# Patient Record
Sex: Male | Born: 1945 | Race: White | Hispanic: No | Marital: Married | Smoking: Never smoker
Health system: Southern US, Community
[De-identification: ages and names within clinical notes are randomized; demographics above are authoritative.]

## PROBLEM LIST (undated history)

## (undated) DIAGNOSIS — K219 Gastro-esophageal reflux disease without esophagitis: Secondary | ICD-10-CM

## (undated) DIAGNOSIS — M1990 Unspecified osteoarthritis, unspecified site: Secondary | ICD-10-CM

## (undated) DIAGNOSIS — Z974 Presence of external hearing-aid: Secondary | ICD-10-CM

## (undated) DIAGNOSIS — R7303 Prediabetes: Secondary | ICD-10-CM

## (undated) DIAGNOSIS — C801 Malignant (primary) neoplasm, unspecified: Secondary | ICD-10-CM

## (undated) DIAGNOSIS — D126 Benign neoplasm of colon, unspecified: Secondary | ICD-10-CM

## (undated) DIAGNOSIS — E785 Hyperlipidemia, unspecified: Secondary | ICD-10-CM

## (undated) DIAGNOSIS — I6529 Occlusion and stenosis of unspecified carotid artery: Secondary | ICD-10-CM

## (undated) DIAGNOSIS — T7840XA Allergy, unspecified, initial encounter: Secondary | ICD-10-CM

## (undated) DIAGNOSIS — I10 Essential (primary) hypertension: Secondary | ICD-10-CM

## (undated) DIAGNOSIS — I2510 Atherosclerotic heart disease of native coronary artery without angina pectoris: Secondary | ICD-10-CM

## (undated) DIAGNOSIS — I639 Cerebral infarction, unspecified: Secondary | ICD-10-CM

## (undated) HISTORY — PX: SHOULDER SURGERY: SHX246

## (undated) HISTORY — DX: Malignant (primary) neoplasm, unspecified: C80.1

## (undated) HISTORY — DX: Gastro-esophageal reflux disease without esophagitis: K21.9

## (undated) HISTORY — DX: Allergy, unspecified, initial encounter: T78.40XA

## (undated) HISTORY — PX: OTHER SURGICAL HISTORY: SHX169

## (undated) NOTE — *Deleted (*Deleted)
TREATMENT   Ther-ex Octane L3 during history taking for warm-up x 5 minutes ( unbilled) Leg press 70# x 20, 85# x 20;  Standing hip strengthening with 4# ankle weights: Hip flexion marches x 20 BLE; HS curls x 20 BLE; Hip abduction x 20 BLE; Hip extension x 20 BLE;  Heel raisesBUE support 2s holdx 20;  Seated LAQ with 4# ankle weights x 20 BLE;  Sit to stand from regular height chair with right lower extremity on 5 inch step2 x 10;   Neuromuscular Re-education All balance exercises performed without UE support: 1/2 foam roll balance with flat side upx 30s; 1/2 foam rollheel/toe rockingh flat side upx 10 each; 1/2 foam roll tandem balance alternating forward LE 30s x 2 eachLE forward Airex alternating 6" step taps alternating LE x 10 each; Airex NBOS eyes open/closed x 30s each; Airex NBOS eyes open horizontal and vertical head turns x 30s each; Airex staggerred stance with front foot on 5" step x 60s each LE;   Pt educated throughout session about proper posture and technique with exercises. Improved exercise technique, movement at target joints, use of target muscles after min to mod verbal, visual, tactile cues.   Patient demonstrates excellent motivation throughout physical therapy session.Continued with balance and strengthening exercises during session today.  Patient reports he is not feeling great today so decreased resistance on the leg press. He requires more seated rest breaks today during session.  Continued with balance exercises on unstable surfaces such as half foam roller and Airex pad. Patient encouraged to continue HEP and follow-up as scheduled.Pt will benefit from PT services to address deficits in strength, balance, and mobility in order to return to full function at home.

---

## 2004-06-25 ENCOUNTER — Ambulatory Visit: Payer: Self-pay | Admitting: Unknown Physician Specialty

## 2008-04-18 ENCOUNTER — Emergency Department: Payer: Self-pay | Admitting: Emergency Medicine

## 2011-01-27 ENCOUNTER — Ambulatory Visit: Payer: Self-pay

## 2011-11-28 DIAGNOSIS — K2970 Gastritis, unspecified, without bleeding: Secondary | ICD-10-CM | POA: Insufficient documentation

## 2011-11-28 DIAGNOSIS — Z0189 Encounter for other specified special examinations: Secondary | ICD-10-CM | POA: Insufficient documentation

## 2011-11-28 DIAGNOSIS — E7800 Pure hypercholesterolemia, unspecified: Secondary | ICD-10-CM | POA: Insufficient documentation

## 2013-09-23 DIAGNOSIS — J301 Allergic rhinitis due to pollen: Secondary | ICD-10-CM | POA: Insufficient documentation

## 2013-09-23 DIAGNOSIS — E559 Vitamin D deficiency, unspecified: Secondary | ICD-10-CM | POA: Insufficient documentation

## 2013-09-23 DIAGNOSIS — C4491 Basal cell carcinoma of skin, unspecified: Secondary | ICD-10-CM | POA: Insufficient documentation

## 2014-01-09 ENCOUNTER — Ambulatory Visit: Payer: Self-pay | Admitting: Gastroenterology

## 2014-01-31 DIAGNOSIS — D126 Benign neoplasm of colon, unspecified: Secondary | ICD-10-CM | POA: Insufficient documentation

## 2014-03-29 ENCOUNTER — Other Ambulatory Visit: Payer: Self-pay | Admitting: *Deleted

## 2014-03-29 ENCOUNTER — Ambulatory Visit (INDEPENDENT_AMBULATORY_CARE_PROVIDER_SITE_OTHER): Payer: Medicare Other | Admitting: Podiatry

## 2014-03-29 ENCOUNTER — Encounter: Payer: Self-pay | Admitting: Podiatry

## 2014-03-29 ENCOUNTER — Ambulatory Visit (INDEPENDENT_AMBULATORY_CARE_PROVIDER_SITE_OTHER): Payer: Medicare Other

## 2014-03-29 ENCOUNTER — Encounter: Payer: Self-pay | Admitting: *Deleted

## 2014-03-29 VITALS — BP 128/77 | HR 77 | Resp 16 | Ht 67.0 in | Wt 165.0 lb

## 2014-03-29 DIAGNOSIS — M2012 Hallux valgus (acquired), left foot: Secondary | ICD-10-CM

## 2014-03-29 DIAGNOSIS — Q828 Other specified congenital malformations of skin: Secondary | ICD-10-CM | POA: Diagnosis not present

## 2014-03-29 DIAGNOSIS — M21612 Bunion of left foot: Secondary | ICD-10-CM

## 2014-03-29 NOTE — Progress Notes (Signed)
   Subjective:    [name], male    DOB: May 24, 1945, 69 y.o.   MRN: [id number]  HPI Comments: Callused lesion on the left foot, left great toe , and 5th plantar met . 2nd met . Real sore when on it.   Foot Pain      Review of Systems  All other systems reviewed and are negative.      Objective:   Physical Exam: I have reviewed his past mental history medications allergies surgery social history and review of systems. Pulses are palpable bilateral. Neurologic sensorium is intact percent was C monofilament. Deep tendon reflexes are intact bilateral muscle strength is 5 over 5 dorsiflexion plantar flexors and inverters everters on physical musculatures intact. Orthopedic evaluation of his rates all joints distal to the ankle for range of motion without crepitation. Cutaneous evaluationof well-hydrated cutis porokeratotic lesion sub-IP joint of the hallux left subsecond metatarsophalangeal joint left and sub-fifth metatarsophalangeal joint left. Minimal calluses to the plantar aspect of the right foot.        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  Plan: Debridement of porokeratotic lesions follow up with him as needed.

## 2014-05-29 LAB — SURGICAL PATHOLOGY

## 2015-12-05 DIAGNOSIS — R42 Dizziness and giddiness: Secondary | ICD-10-CM | POA: Insufficient documentation

## 2016-08-19 DIAGNOSIS — I10 Essential (primary) hypertension: Secondary | ICD-10-CM

## 2017-06-18 DIAGNOSIS — K219 Gastro-esophageal reflux disease without esophagitis: Secondary | ICD-10-CM | POA: Insufficient documentation

## 2019-04-01 ENCOUNTER — Ambulatory Visit: Payer: Medicare Other | Attending: Internal Medicine

## 2019-04-01 DIAGNOSIS — Z23 Encounter for immunization: Secondary | ICD-10-CM

## 2019-04-01 NOTE — Progress Notes (Signed)
   Covid-19 Vaccination Clinic  Name:  Russell Herman    MRN: [id number] DOB: 1945-10-09  04/01/2019  [name] was observed post Covid-19 immunization for 15 minutes without incidence. He was provided with Vaccine Information Sheet and instruction to access the V-Safe system.   [name] was instructed to call 911 with any severe reactions post vaccine: Marland Kitchen Difficulty breathing  . Swelling of your face and throat  . A fast heartbeat  . A bad rash all over your body  . Dizziness and weakness    Immunizations Administered    Name Date Dose VIS Date Route   Pfizer COVID-19 Vaccine 04/01/2019 10:44 AM 0.3 mL 01/14/2019 Intramuscular   Manufacturer: [place]   Lot: [id number]   [place]: [id number]

## 2019-04-27 ENCOUNTER — Encounter: Payer: Self-pay | Admitting: Otolaryngology

## 2019-04-27 ENCOUNTER — Ambulatory Visit: Payer: Medicare Other | Attending: Internal Medicine

## 2019-04-27 ENCOUNTER — Other Ambulatory Visit: Payer: Self-pay

## 2019-04-27 DIAGNOSIS — Z23 Encounter for immunization: Secondary | ICD-10-CM

## 2019-04-27 NOTE — Progress Notes (Signed)
   Covid-19 Vaccination Clinic  Name:  HIXON SANDO    MRN: [id number] DOB: 1945-04-18  04/27/2019  Mr. Lorenzano was observed post Covid-19 immunization for 15 minutes without incident. He was provided with Vaccine Information Sheet and instruction to access the V-Safe system.   Mr. Mandl was instructed to call 911 with any severe reactions post vaccine: Marland Kitchen Difficulty breathing  . Swelling of face and throat  . A fast heartbeat  . A bad rash all over body  . Dizziness and weakness   Immunizations Administered    Name Date Dose VIS Date Route   Pfizer COVID-19 Vaccine 04/27/2019  1:40 PM 0.3 mL 01/14/2019 Intramuscular   Manufacturer: [place]   Lot: [id number]   [place]: [id number]

## 2019-04-28 NOTE — Discharge Instructions (Signed)
General Anesthesia, Adult, Care After This sheet gives you information about how to care for yourself after your procedure. Your health care provider may also give you more specific instructions. If you have problems or questions, contact your health care provider. What can I expect after the procedure? After the procedure, the following side effects are common:  Pain or discomfort at the IV site.  Nausea.  Vomiting.  Sore throat.  Trouble concentrating.  Feeling cold or chills.  Weak or tired.  Sleepiness and fatigue.  Soreness and body aches. These side effects can affect parts of the body that were not involved in surgery. Follow these instructions at home:  For at least 24 hours after the procedure:  Have a responsible adult stay with you. It is important to have someone help care for you until you are awake and alert.  Rest as needed.  Do not: ? Participate in activities in which you could fall or become injured. ? Drive. ? Use heavy machinery. ? Drink alcohol. ? Take sleeping pills or medicines that cause drowsiness. ? Make important decisions or sign legal documents. ? Take care of children on your own. Eating and drinking  Follow any instructions from your health care provider about eating or drinking restrictions.  When you feel hungry, start by eating small amounts of foods that are soft and easy to digest (bland), such as toast. Gradually return to your regular diet.  Drink enough fluid to keep your urine pale yellow.  If you vomit, rehydrate by drinking water, juice, or clear broth. General instructions  If you have sleep apnea, surgery and certain medicines can increase your risk for breathing problems. Follow instructions from your health care provider about wearing your sleep device: ? Anytime you are sleeping, including during daytime naps. ? While taking prescription pain medicines, sleeping medicines, or medicines that make you drowsy.  Return to  your normal activities as told by your health care provider. Ask your health care provider what activities are safe for you.  Take over-the-counter and prescription medicines only as told by your health care provider.  If you smoke, do not smoke without supervision.  Keep all follow-up visits as told by your health care provider. This is important. Contact a health care provider if:  You have nausea or vomiting that does not get better with medicine.  You cannot eat or drink without vomiting.  You have pain that does not get better with medicine.  You are unable to pass urine.  You develop a skin rash.  You have a fever.  You have redness around your IV site that gets worse. Get help right away if:  You have difficulty breathing.  You have chest pain.  You have blood in your urine or stool, or you vomit blood. Summary  After the procedure, it is common to have a sore throat or nausea. It is also common to feel tired.  Have a responsible adult stay with you for the first 24 hours after general anesthesia. It is important to have someone help care for you until you are awake and alert.  When you feel hungry, start by eating small amounts of foods that are soft and easy to digest (bland), such as toast. Gradually return to your regular diet.  Drink enough fluid to keep your urine pale yellow.  Return to your normal activities as told by your health care provider. Ask your health care provider what activities are safe for you. This information is not   intended to replace advice given to you by your health care provider. Make sure you discuss any questions you have with your health care provider. Document Revised: 01/23/2017 Document Reviewed: 09/05/2016 Elsevier Patient Education  2020 Elsevier Inc.  

## 2019-05-03 ENCOUNTER — Other Ambulatory Visit
Admission: RE | Admit: 2019-05-03 | Discharge: 2019-05-03 | Disposition: A | Discharge status: Home / Self Care | Payer: Medicare Other | Source: Ambulatory Visit | Attending: Otolaryngology | Admitting: Otolaryngology

## 2019-05-03 ENCOUNTER — Other Ambulatory Visit: Payer: Self-pay

## 2019-05-03 DIAGNOSIS — Z01812 Encounter for preprocedural laboratory examination: Secondary | ICD-10-CM | POA: Diagnosis present

## 2019-05-03 DIAGNOSIS — Z20822 Contact with and (suspected) exposure to covid-19: Secondary | ICD-10-CM | POA: Diagnosis not present

## 2019-05-03 LAB — SARS CORONAVIRUS 2 (TAT 6-24 HRS): SARS Coronavirus 2: NEGATIVE

## 2019-05-05 ENCOUNTER — Ambulatory Visit
Admission: RE | Admit: 2019-05-05 | Discharge: 2019-05-05 | Disposition: A | Discharge status: Home / Self Care | Payer: Medicare Other | Source: Ambulatory Visit | Attending: Otolaryngology | Admitting: Otolaryngology

## 2019-05-05 ENCOUNTER — Other Ambulatory Visit: Payer: Self-pay

## 2019-05-05 ENCOUNTER — Ambulatory Visit: Payer: Medicare Other | Admitting: Anesthesiology

## 2019-05-05 ENCOUNTER — Encounter
Admission: RE | Disposition: A | Discharge status: Home / Self Care | Payer: Self-pay | Source: Ambulatory Visit | Attending: Otolaryngology

## 2019-05-05 ENCOUNTER — Encounter: Payer: Self-pay | Admitting: Otolaryngology

## 2019-05-05 DIAGNOSIS — K219 Gastro-esophageal reflux disease without esophagitis: Secondary | ICD-10-CM | POA: Insufficient documentation

## 2019-05-05 DIAGNOSIS — Z85828 Personal history of other malignant neoplasm of skin: Secondary | ICD-10-CM | POA: Insufficient documentation

## 2019-05-05 DIAGNOSIS — Z8711 Personal history of peptic ulcer disease: Secondary | ICD-10-CM | POA: Insufficient documentation

## 2019-05-05 DIAGNOSIS — Z882 Allergy status to sulfonamides status: Secondary | ICD-10-CM | POA: Insufficient documentation

## 2019-05-05 DIAGNOSIS — L980 Pyogenic granuloma: Secondary | ICD-10-CM | POA: Diagnosis present

## 2019-05-05 DIAGNOSIS — Z79899 Other long term (current) drug therapy: Secondary | ICD-10-CM | POA: Insufficient documentation

## 2019-05-05 DIAGNOSIS — Z881 Allergy status to other antibiotic agents status: Secondary | ICD-10-CM | POA: Insufficient documentation

## 2019-05-05 HISTORY — DX: Presence of external hearing-aid: Z97.4

## 2019-05-05 HISTORY — DX: Unspecified osteoarthritis, unspecified site: M19.90

## 2019-05-05 HISTORY — DX: Gastro-esophageal reflux disease without esophagitis: K21.9

## 2019-05-05 HISTORY — DX: Essential (primary) hypertension: I10

## 2019-05-05 HISTORY — PX: EXCISION OF TONGUE LESION: SHX6434

## 2019-05-05 SURGERY — EXCISION, LESION, TONGUE
Anesthesia: General | Site: Mouth | Laterality: Bilateral

## 2019-05-05 MED ORDER — LACTATED RINGERS IV SOLN
100.0000 mL/h | INTRAVENOUS | Status: DC
  Administered 2019-05-05: 100 mL/h via INTRAVENOUS

## 2019-05-05 MED ORDER — HYDROCODONE-ACETAMINOPHEN 5-325 MG PO TABS: 1.0000 | ORAL_TABLET | Freq: Four times a day (QID) | ORAL | 0 refills | Status: AC | PRN

## 2019-05-05 MED ORDER — OXYCODONE HCL 5 MG PO TABS
5.0000 mg | ORAL_TABLET | Freq: Once | ORAL | Status: AC | PRN
  Administered 2019-05-05: 5 mg via ORAL

## 2019-05-05 MED ORDER — DEXAMETHASONE SODIUM PHOSPHATE 4 MG/ML IJ SOLN
INTRAMUSCULAR | Status: DC | PRN
  Administered 2019-05-05: 10 mg via INTRAVENOUS

## 2019-05-05 MED ORDER — FENTANYL CITRATE (PF) 100 MCG/2ML IJ SOLN: 25.0000 ug | INTRAMUSCULAR | Status: DC | PRN

## 2019-05-05 MED ORDER — OXYCODONE HCL 5 MG/5ML PO SOLN: 5.0000 mg | Freq: Once | ORAL | Status: AC | PRN

## 2019-05-05 MED ORDER — SUCCINYLCHOLINE CHLORIDE 20 MG/ML IJ SOLN
INTRAMUSCULAR | Status: DC | PRN
  Administered 2019-05-05: 180 mg via INTRAVENOUS

## 2019-05-05 MED ORDER — FENTANYL CITRATE (PF) 100 MCG/2ML IJ SOLN
INTRAMUSCULAR | Status: DC | PRN
  Administered 2019-05-05: 50 ug via INTRAVENOUS

## 2019-05-05 MED ORDER — PROPOFOL 10 MG/ML IV BOLUS
INTRAVENOUS | Status: DC | PRN
  Administered 2019-05-05: 110 mg via INTRAVENOUS

## 2019-05-05 MED ORDER — ROCURONIUM BROMIDE 100 MG/10ML IV SOLN
INTRAVENOUS | Status: DC | PRN
  Administered 2019-05-05: 10 mg via INTRAVENOUS

## 2019-05-05 MED ORDER — LIDOCAINE HCL (CARDIAC) PF 100 MG/5ML IV SOSY
PREFILLED_SYRINGE | INTRAVENOUS | Status: DC | PRN
  Administered 2019-05-05: 60 mg via INTRAVENOUS

## 2019-05-05 MED ORDER — ONDANSETRON HCL 4 MG/2ML IJ SOLN
INTRAMUSCULAR | Status: DC | PRN
  Administered 2019-05-05: 4 mg via INTRAVENOUS

## 2019-05-05 MED ORDER — MIDAZOLAM HCL 5 MG/5ML IJ SOLN
INTRAMUSCULAR | Status: DC | PRN
  Administered 2019-05-05: 2 mg via INTRAVENOUS

## 2019-05-05 SURGICAL SUPPLY — 14 items
ELECT REM PT RETURN 9FT ADLT (ELECTROSURGICAL) ×3
ELECTRODE REM PT RTRN 9FT ADLT (ELECTROSURGICAL) ×1 IMPLANT
GLOVE PI ULTRA LF STRL 7.5 (GLOVE) ×1 IMPLANT
GLOVE PI ULTRA NON LATEX 7.5 (GLOVE) ×4
KIT TURNOVER KIT A (KITS) ×3 IMPLANT
NDL HYPO 27GX1-1/4 (NEEDLE) IMPLANT
NEEDLE HYPO 27GX1-1/4 (NEEDLE) ×3 IMPLANT
NS IRRIG 500ML POUR BTL (IV SOLUTION) ×3 IMPLANT
PENCIL SMOKE EVACUATOR (MISCELLANEOUS) ×3 IMPLANT
SPONGE XRAY 4X4 16PLY STRL (MISCELLANEOUS) ×3 IMPLANT
STRAP BODY AND KNEE 60X3 (MISCELLANEOUS) ×3 IMPLANT
SUT CHROMIC 5 0 P 3 (SUTURE) ×2 IMPLANT
SYR 3ML LL SCALE MARK (SYRINGE) ×2 IMPLANT
TOWEL OR 17X26 4PK STRL BLUE (TOWEL DISPOSABLE) ×3 IMPLANT

## 2019-05-05 NOTE — Op Note (Signed)
05/05/2019  10:38 AM    Darlyn Read  [place]:[id number]   Pre-Op Dx: Right dorsal tongue lesion  Post-op Dx: Right dorsal tongue lesion  Proc: Excision right dorsal tongue lesion  Surg:  Elon Alas Cloyce Blankenhorn  Anes:  GOT  EBL: Minimal  Comp: None  Findings: Exophytic growth from the right dorsal tongue that appeared to have a small stalk attached to the mucosal surface of the tongue.  Procedure: Patient was brought to the operating room and placed in supine position.  He was given general anesthesia by oral endotracheal intubation.  Once the patient was asleep a mouthgag was placed to hold his teeth open.  A towel clip was used to grasp the anterior midline tongue and pull the tongue forward.  The lesion was a centimeter and a half and attached to the right dorsal anterior tongue.  It was attached to the mucosa and was rounded and sticking out from the mucosa.  Using electrocautery the base of the lesion was excised at the mucosal layer in an ellipse of mucosa was removed.  This was attached to the bottom of the lesion.  The entire lesion and mucosa was sent for permanent section.  The defect was about a centimeter long and half centimeter wide with muscle evident beneath it.  There was no significant bleeding at all.  The wound was closed with 3 interrupted sutures of 5-0 chromic.  These were placed where the knots were buried underneath the surface.  This brought the edges together to close the defect.  The patient tolerated the procedure well the oral gag was removed to allow the mouth to close.  He was awakened and taken to the recovery room in satisfactory condition.  Dispo:   To PACU to be discharged home.  Plan: To follow-up in the office in 1 week.  Make sure the wound is healing well and we will go over the pathology report.  I have written for some Tylenol with hydrocodone for pain if needed over the next couple days but then he can use Tylenol or ibuprofen as necessary.  We will  start with a liquid diet and slowly increase it as tolerated.  Elon Alas Hanna Ra  05/05/2019 10:38 AM

## 2019-05-05 NOTE — Transfer of Care (Signed)
Immediate Anesthesia Transfer of Care Note  Patient: Russell Herman  Procedure(s) Performed: EXCISION OF DORSAL TONGUE LESION (Bilateral Mouth)  Patient Location: PACU  Anesthesia Type: General  Level of Consciousness: awake, alert  and patient cooperative  Airway and Oxygen Therapy: Patient Spontanous Breathing and Patient connected to supplemental oxygen  Post-op Assessment: Post-op Vital signs reviewed, Patient's Cardiovascular Status Stable, Respiratory Function Stable, Patent Airway and No signs of Nausea or vomiting  Post-op Vital Signs: Reviewed and stable  Complications: No apparent anesthesia complications

## 2019-05-05 NOTE — Anesthesia Postprocedure Evaluation (Signed)
Anesthesia Post Note  Patient: Russell Herman  Procedure(s) Performed: EXCISION OF DORSAL TONGUE LESION (Bilateral Mouth)     Patient location during evaluation: PACU Anesthesia Type: General Level of consciousness: awake and alert Pain management: pain level controlled Vital Signs Assessment: post-procedure vital signs reviewed and stable Respiratory status: spontaneous breathing, nonlabored ventilation, respiratory function stable and patient connected to nasal cannula oxygen Cardiovascular status: blood pressure returned to baseline and stable Postop Assessment: no apparent nausea or vomiting Anesthetic complications: no    Adele Barthel Duvall Comes

## 2019-05-05 NOTE — H&P (Signed)
H&P has been reviewed and patient reevaluated, no changes necessary. To be downloaded later.  

## 2019-05-05 NOTE — Anesthesia Procedure Notes (Signed)
Procedure Name: Intubation Date/Time: 05/05/2019 10:15 AM Performed by: Silvana Newness, CRNA Pre-anesthesia Checklist: Patient identified, Emergency Drugs available, Suction available, Patient being monitored and Timeout performed Patient Re-evaluated:Patient Re-evaluated prior to induction Oxygen Delivery Method: Circle system utilized Preoxygenation: Pre-oxygenation with 100% oxygen Induction Type: IV induction Ventilation: Mask ventilation without difficulty Laryngoscope Size: Mac and 4 Grade View: Grade I Tube type: Oral Tube size: 7.0 mm Number of attempts: 1 Airway Equipment and Method: Stylet Placement Confirmation: ETT inserted through vocal cords under direct vision,  positive ETCO2 and breath sounds checked- equal and bilateral Secured at: 22 cm Tube secured with: Tape Dental Injury: Teeth and Oropharynx as per pre-operative assessment

## 2019-05-05 NOTE — Anesthesia Preprocedure Evaluation (Signed)
Anesthesia Evaluation  Patient identified by MRN, date of birth, ID band Patient awake    History of Anesthesia Complications Negative for: history of anesthetic complications  Airway Mallampati: III  TM Distance: >3 FB Neck ROM: Full   Comment: Dime-sized lesion on lateral side of tongue Dental  (+) Partial Upper   Pulmonary neg pulmonary ROS,    Pulmonary exam normal        Cardiovascular hypertension, Normal cardiovascular exam     Neuro/Psych negative neurological ROS     GI/Hepatic Neg liver ROS, GERD  Medicated and Controlled,  Endo/Other  negative endocrine ROS  Renal/GU negative Renal ROS     Musculoskeletal   Abdominal   Peds  Hematology negative hematology ROS (+)   Anesthesia Other Findings   Reproductive/Obstetrics                             Anesthesia Physical Anesthesia Plan  ASA: II  Anesthesia Plan: General   Post-op Pain Management:    Induction: Intravenous  PONV Risk Score and Plan: 2 and Ondansetron and Treatment may vary due to age or medical condition  Airway Management Planned: Oral ETT  Additional Equipment: None  Intra-op Plan:   Post-operative Plan: Extubation in OR  Informed Consent: I have reviewed the patients History and Physical, chart, labs and discussed the procedure including the risks, benefits and alternatives for the proposed anesthesia with the patient or authorized representative who has indicated his/her understanding and acceptance.       Plan Discussed with: CRNA  Anesthesia Plan Comments:         Anesthesia Quick Evaluation

## 2019-05-06 LAB — SURGICAL PATHOLOGY

## 2019-05-12 ENCOUNTER — Encounter: Payer: Self-pay | Admitting: *Deleted

## 2019-07-05 ENCOUNTER — Inpatient Hospital Stay
Admission: EM | Admit: 2019-07-05 | Discharge: 2019-07-07 | DRG: 066 | Disposition: A | Discharge status: Rehabilitation Facility | Payer: Medicare Other | Attending: Internal Medicine | Admitting: Internal Medicine

## 2019-07-05 ENCOUNTER — Other Ambulatory Visit: Payer: Self-pay

## 2019-07-05 ENCOUNTER — Observation Stay: Payer: Medicare Other

## 2019-07-05 ENCOUNTER — Encounter: Payer: Self-pay | Admitting: Emergency Medicine

## 2019-07-05 ENCOUNTER — Emergency Department: Payer: Medicare Other

## 2019-07-05 DIAGNOSIS — I779 Disorder of arteries and arterioles, unspecified: Secondary | ICD-10-CM

## 2019-07-05 DIAGNOSIS — Z20822 Contact with and (suspected) exposure to covid-19: Secondary | ICD-10-CM | POA: Diagnosis present

## 2019-07-05 DIAGNOSIS — E876 Hypokalemia: Secondary | ICD-10-CM | POA: Diagnosis present

## 2019-07-05 DIAGNOSIS — I639 Cerebral infarction, unspecified: Principal | ICD-10-CM | POA: Diagnosis present

## 2019-07-05 DIAGNOSIS — Z85828 Personal history of other malignant neoplasm of skin: Secondary | ICD-10-CM

## 2019-07-05 DIAGNOSIS — G459 Transient cerebral ischemic attack, unspecified: Secondary | ICD-10-CM | POA: Diagnosis present

## 2019-07-05 DIAGNOSIS — I10 Essential (primary) hypertension: Secondary | ICD-10-CM | POA: Diagnosis present

## 2019-07-05 DIAGNOSIS — E785 Hyperlipidemia, unspecified: Secondary | ICD-10-CM | POA: Diagnosis present

## 2019-07-05 DIAGNOSIS — I6523 Occlusion and stenosis of bilateral carotid arteries: Secondary | ICD-10-CM | POA: Diagnosis present

## 2019-07-05 DIAGNOSIS — Z888 Allergy status to other drugs, medicaments and biological substances status: Secondary | ICD-10-CM

## 2019-07-05 DIAGNOSIS — E7800 Pure hypercholesterolemia, unspecified: Secondary | ICD-10-CM | POA: Diagnosis present

## 2019-07-05 DIAGNOSIS — E782 Mixed hyperlipidemia: Secondary | ICD-10-CM | POA: Diagnosis present

## 2019-07-05 DIAGNOSIS — E559 Vitamin D deficiency, unspecified: Secondary | ICD-10-CM | POA: Diagnosis present

## 2019-07-05 DIAGNOSIS — G8314 Monoplegia of lower limb affecting left nondominant side: Secondary | ICD-10-CM | POA: Diagnosis present

## 2019-07-05 DIAGNOSIS — M47812 Spondylosis without myelopathy or radiculopathy, cervical region: Secondary | ICD-10-CM | POA: Diagnosis present

## 2019-07-05 DIAGNOSIS — Z79899 Other long term (current) drug therapy: Secondary | ICD-10-CM

## 2019-07-05 DIAGNOSIS — K219 Gastro-esophageal reflux disease without esophagitis: Secondary | ICD-10-CM | POA: Diagnosis present

## 2019-07-05 DIAGNOSIS — Z808 Family history of malignant neoplasm of other organs or systems: Secondary | ICD-10-CM

## 2019-07-05 DIAGNOSIS — I739 Peripheral vascular disease, unspecified: Secondary | ICD-10-CM | POA: Diagnosis present

## 2019-07-05 DIAGNOSIS — Z882 Allergy status to sulfonamides status: Secondary | ICD-10-CM

## 2019-07-05 DIAGNOSIS — R29703 NIHSS score 3: Secondary | ICD-10-CM | POA: Diagnosis present

## 2019-07-05 DIAGNOSIS — Z803 Family history of malignant neoplasm of breast: Secondary | ICD-10-CM

## 2019-07-05 HISTORY — DX: Benign neoplasm of colon, unspecified: D12.6

## 2019-07-05 LAB — DIFFERENTIAL
Abs Immature Granulocytes: 0.04 10*3/uL (ref 0.00–0.07)
Basophils Absolute: 0.1 10*3/uL (ref 0.0–0.1)
Basophils Relative: 1 %
Eosinophils Absolute: 0.3 10*3/uL (ref 0.0–0.5)
Eosinophils Relative: 4 %
Immature Granulocytes: 1 %
Lymphocytes Relative: 24 %
Lymphs Abs: 1.9 10*3/uL (ref 0.7–4.0)
Monocytes Absolute: 0.8 10*3/uL (ref 0.1–1.0)
Monocytes Relative: 10 %
Neutro Abs: 4.7 10*3/uL (ref 1.7–7.7)
Neutrophils Relative %: 60 %

## 2019-07-05 LAB — CBC
HCT: 45 % (ref 39.0–52.0)
Hemoglobin: 15.5 g/dL (ref 13.0–17.0)
MCH: 29.4 pg (ref 26.0–34.0)
MCHC: 34.4 g/dL (ref 30.0–36.0)
MCV: 85.4 fL (ref 80.0–100.0)
Platelets: 283 10*3/uL (ref 150–400)
RBC: 5.27 MIL/uL (ref 4.22–5.81)
RDW: 12.6 % (ref 11.5–15.5)
WBC: 7.7 10*3/uL (ref 4.0–10.5)
nRBC: 0 % (ref 0.0–0.2)

## 2019-07-05 LAB — COMPREHENSIVE METABOLIC PANEL
ALT: 24 U/L (ref 0–44)
AST: 29 U/L (ref 15–41)
Albumin: 4.4 g/dL (ref 3.5–5.0)
Alkaline Phosphatase: 49 U/L (ref 38–126)
Anion gap: 11 (ref 5–15)
BUN: 22 mg/dL (ref 8–23)
CO2: 27 mmol/L (ref 22–32)
Calcium: 9.3 mg/dL (ref 8.9–10.3)
Chloride: 101 mmol/L (ref 98–111)
Creatinine, Ser: 1.19 mg/dL (ref 0.61–1.24)
GFR calc Af Amer: >60 mL/min (ref >60)
GFR calc non Af Amer: 59 mL/min — ABNORMAL LOW (ref >60)
Glucose, Bld: 122 mg/dL — ABNORMAL HIGH (ref 70–99)
Potassium: 3.2 mmol/L — ABNORMAL LOW (ref 3.5–5.1)
Sodium: 139 mmol/L (ref 135–145)
Total Bilirubin: 1.1 mg/dL (ref 0.3–1.2)
Total Protein: 7.3 g/dL (ref 6.5–8.1)

## 2019-07-05 LAB — PROTIME-INR
INR: 1.1 (ref 0.8–1.2)
Prothrombin Time: 13.7 seconds (ref 11.4–15.2)

## 2019-07-05 LAB — APTT: aPTT: 31 seconds (ref 24–36)

## 2019-07-05 LAB — GLUCOSE, CAPILLARY: Glucose-Capillary: 111 mg/dL — ABNORMAL HIGH (ref 70–99)

## 2019-07-05 LAB — SARS CORONAVIRUS 2 BY RT PCR (HOSPITAL ORDER, PERFORMED IN ~~LOC~~ HOSPITAL LAB): SARS Coronavirus 2: NEGATIVE

## 2019-07-05 MED ORDER — FLUTICASONE PROPIONATE 50 MCG/ACT NA SUSP: 1.0000 | Freq: Every day | NASAL | Status: DC

## 2019-07-05 MED ORDER — FLUTICASONE PROPIONATE 50 MCG/ACT NA SUSP
1.0000 | Freq: Every day | NASAL | Status: DC
  Filled 2019-07-05: qty 16

## 2019-07-05 MED ORDER — STROKE: EARLY STAGES OF RECOVERY BOOK: Freq: Once | Status: AC

## 2019-07-05 MED ORDER — ASPIRIN 325 MG PO TABS
325.0000 mg | ORAL_TABLET | Freq: Every day | ORAL | Status: DC
  Administered 2019-07-06: 325 mg via ORAL
  Filled 2019-07-05 (×2): qty 1

## 2019-07-05 MED ORDER — PANTOPRAZOLE SODIUM 40 MG PO TBEC
40.0000 mg | DELAYED_RELEASE_TABLET | Freq: Every day | ORAL | Status: DC
  Administered 2019-07-05 – 2019-07-06 (×2): 40 mg via ORAL
  Filled 2019-07-05 (×2): qty 1

## 2019-07-05 MED ORDER — ACETAMINOPHEN 160 MG/5ML PO SOLN
650.0000 mg | ORAL | Status: DC | PRN
  Filled 2019-07-05: qty 20.3

## 2019-07-05 MED ORDER — ACETAMINOPHEN 325 MG PO TABS: 650.0000 mg | ORAL_TABLET | ORAL | Status: DC | PRN

## 2019-07-05 MED ORDER — ATORVASTATIN CALCIUM 20 MG PO TABS
80.0000 mg | ORAL_TABLET | Freq: Every day | ORAL | Status: DC
  Administered 2019-07-05: 22:00:00 80 mg via ORAL
  Filled 2019-07-05: qty 4

## 2019-07-05 MED ORDER — ASPIRIN 300 MG RE SUPP: 300.0000 mg | Freq: Every day | RECTAL | Status: DC

## 2019-07-05 MED ORDER — ACETAMINOPHEN 650 MG RE SUPP: 650.0000 mg | RECTAL | Status: DC | PRN

## 2019-07-05 MED ORDER — ASPIRIN 81 MG PO CHEW
324.0000 mg | CHEWABLE_TABLET | Freq: Once | ORAL | Status: AC
  Administered 2019-07-05: 324 mg via ORAL
  Filled 2019-07-05: qty 4

## 2019-07-05 MED ORDER — MONTELUKAST SODIUM 10 MG PO TABS
10.0000 mg | ORAL_TABLET | Freq: Every day | ORAL | Status: DC
  Filled 2019-07-05: qty 1

## 2019-07-05 NOTE — ED Notes (Signed)
Pt walking hallway with PT. Using walker.

## 2019-07-05 NOTE — ED Notes (Signed)
Pt denies any needs except for a 2nd pillow. Given to pt. Bed locked low. Rail up. Family with pt.

## 2019-07-05 NOTE — ED Provider Notes (Signed)
[facility] Emergency Department Provider Note   ____________________________________________   First MD Initiated Contact with Patient 07/05/19 [phone]     (approximate)  I have reviewed the triage vital signs and the nursing notes.   HISTORY  Chief Complaint Numbness and Dizziness    HPI Russell Herman is a 74 y.o. male who denies any medical problems but says he takes medicines for high blood pressure.  He reports he woke up this morning with his left side feeling numb and tingly in his left leg being unable to support his weight.  When he went to bed last night he was fine.  He has no other complaints at this time.  He has no slurry speech or visual problems.     Past Medical History:  Diagnosis Date  . Allergy   . Arthritis   . Cancer ([place])   . GERD (gastroesophageal reflux disease)   . Hypertension   . Reflux   . Wears hearing aid in both ears     Patient Active Problem List   Diagnosis Date Noted  . Adenomatous colon polyp 01/31/2014  . Basal cell carcinoma 09/23/2013  . Hay fever 09/23/2013  . Vitamin D deficiency 09/23/2013  . Gastric catarrh 11/28/2011  . Hypercholesteremia 11/28/2011  . Laboratory examination 11/28/2011    Past Surgical History:  Procedure Laterality Date  . cyst removed    . EXCISION OF TONGUE LESION Bilateral 05/05/2019   Procedure: EXCISION OF DORSAL TONGUE LESION;  Surgeon: Margaretha Sheffield, MD;  Location: [facility];  Service: ENT;  Laterality: Bilateral;  . skin cancer removed      Prior to Admission medications   Medication Sig Start Date End Date Taking? Authorizing Provider  chlorthalidone (HYGROTON) 25 MG tablet Take 25 mg by mouth daily.    [provider]  Cholecalciferol (VITAMIN D-3) 1000 UNITS CAPS Take by mouth.    [provider]  Fish Oil OIL by Does not apply route.    [provider]  fluticasone Asencion Islam) 50 MCG/ACT nasal spray  03/08/14   [provider]  montelukast (SINGULAIR) 10 MG tablet Take 10 mg by mouth at bedtime.    [provider]  omeprazole (PRILOSEC) 20 MG capsule  03/03/14   [provider]    Allergies Keflex [cephalexin], Statins, Sulfa antibiotics, and Sulfamethoxazole-trimethoprim  No family history on file.  Social History Social History   Tobacco Use  . Smoking status: Never Smoker  . Smokeless tobacco: Never Used  . Tobacco comment: smoked "some" as teenager  Substance Use Topics  . Alcohol use: Not Currently    Alcohol/week: 0.0 standard drinks  . Drug use: Not on file    Review of Systems  Constitutional: No fever/chills Eyes: No visual changes. ENT: No sore throat. Cardiovascular: Denies chest pain. Respiratory: Denies shortness of breath. Gastrointestinal: No abdominal pain.  No nausea, no vomiting.  No diarrhea.  No constipation. Genitourinary: Negative for dysuria. Musculoskeletal: Negative for back pain. Skin: Negative for rash. Neurological: Negative for headaches ____________________________________________   PHYSICAL EXAM:  VITAL SIGNS: ED Triage Vitals  Enc Vitals Group     BP 07/05/19 0743 133/78     Pulse Rate 07/05/19 0743 74     Resp 07/05/19 0743 16     Temp 07/05/19 0743 98.5 F (36.9 C)     Temp Source 07/05/19 0743 Oral     SpO2 07/05/19 0743 100 %     Weight 07/05/19 0744 160  lb (72.6 kg)     Height 07/05/19 0744 5\' 6"  (1.676 m)     Head Circumference --      Peak Flow --      Pain Score 07/05/19 0744 0     Pain Loc --      Pain Edu? --      Excl. in [place]? --     Constitutional: Alert and oriented. Well appearing and in no acute distress. Eyes: Conjunctivae are normal. PER. EOMI. Head: Atraumatic. Nose: No congestion/rhinnorhea. Mouth/Throat: Mucous membranes are moist.  Oropharynx non-erythematous. Neck: No stridor.   Cardiovascular: Normal rate, regular rhythm. Grossly normal heart sounds.  Good peripheral  circulation. Respiratory: Normal respiratory effort.  No retractions. Lungs CTAB. Gastrointestinal: Soft and nontender. No distention. No abdominal bruits. No CVA tenderness. Musculoskeletal: No lower extremity tenderness nor edema.   Neurologic:  Normal speech and language.  Cranial nerves II through XII are intact.  Cerebellar finger-to-nose any rapid alternating movements and hands are normal motor strength is 5/5 throughout although patient has a little bit of drift when he lifts his left leg off the bed the left leg drifts downward but he is able to keep it up when I remind him.  Patient reports numbness and tingling in the left arm and leg not on the face. Skin:  Skin is warm, dry and intact. No rash noted. Psychiatric: Mood and affect are normal. Speech and behavior are normal.  ____________________________________________   LABS (all labs ordered are listed, but only abnormal results are displayed)  Labs Reviewed  GLUCOSE, CAPILLARY - Abnormal; Notable for the following components:      Result Value   Glucose-Capillary 111 (*)    All other components within normal limits  SARS CORONAVIRUS 2 BY RT PCR (HOSPITAL ORDER, [facility])  CBC  DIFFERENTIAL  PROTIME-INR  APTT  COMPREHENSIVE METABOLIC PANEL  CBG MONITORING, ED   ____________________________________________  EKG EKG read interpreted by me shows normal sinus rhythm rate of 74 normal axis essentially normal EKG  ____________________________________________  RADIOLOGY  ED MD interpretation:  Official radiology report(s): No results found.  ____________________________________________   PROCEDURES  Procedure(s) performed (including Critical Care): Cardiac monitor paced on the patient at bedside shows a heart rate in the 70s sinus rhythm Critical care time 15 minutes this includes examining the patient contacting neurology and discussing the patient with neurology reviewing the CT scan  and blood work and looking at the old records. Procedures   ____________________________________________   INITIAL IMPRESSION / ASSESSMENT AND PLAN / ED COURSE  Wakes up from sleep with left-sided numbness and slight weakness.  This sounds like it is a stroke with unknown onset time.  He does not meet criteria for TPA as he woke up this way.  Other etiologies like MS are unlikely at his age.  We will have to differentiate between ischemic and hemorrhagic stroke and have ordered a CT.  Also blood work is been ordered.              ____________________________________________   FINAL CLINICAL IMPRESSION(S) / ED DIAGNOSES  Final diagnoses:  Cerebrovascular accident (CVA), unspecified mechanism [facility])     ED Discharge Orders    None       Note:  This document was prepared using Dragon voice recognition software and may include unintentional dictation errors.    Nena Polio, MD 07/05/19 [phone]

## 2019-07-05 NOTE — Plan of Care (Signed)
Pt admitted w/L side weakness and numbness.  + for stroke.  Ambulates well with walker to BR. Very alert and oriented. Eager to improve.

## 2019-07-05 NOTE — ED Triage Notes (Signed)
Patient from home via [facility]. Reports he woke up at 6 am and his left arm and leg "didn't feel right". Patient reports he went to get out of bed and noticed he was very dizzy and states his left leg "gave way". Patient reports he went to bed at 23:00 last night feeling normal. Equal strength noted. Decreased sensation in left arm and leg.

## 2019-07-05 NOTE — H&P (Signed)
History and Physical    [name] [id number] DOB: 02-12-1945 DOA: 07/05/2019  PCP: Valera Castle, MD   Patient coming from: Home  I have personally briefly reviewed patient's old medical records in [place]  Chief Complaint: Left-sided weakness and numbness  HPI: Russell Herman is a 74 y.o. male with medical history significant for dyslipidemia and hypertension who presents to the ER for evaluation of left-sided numbness and tingling in his left leg with inability to bear weight on his left lower extremity.  Patient's last known well was last night when he went to bed.  He woke up with the symptoms.  He denies having any speech or visual problems and has no difficulty swallowing.  He denies having any headaches. He denies having any chest pain, shortness of breath, palpitations, diaphoresis, dizziness, lightheadedness.  Patient is able to move all his extremities but continues to complain of numbness involving his left side. Twelve-lead EKG showed normal sinus rhythm CT scan of the head without contrast showed no acute intracranial hemorrhage, mass effect, or evidence of acute infarction.  ED Course: Patient is a 74 year old male with a history of hypertension who presented to the ER with symptoms of left-sided numbness and tingling in his left leg with inability to bear weight on his left side.  Patient's last known well was last night.  Patient is not a candidate for TPA.  He will be referred to the hospital for further evaluation  Review of Systems: As per HPI otherwise 10 point review of systems negative.    Past Medical History:  Diagnosis Date   Allergy    Arthritis    Cancer ([place])    GERD (gastroesophageal reflux disease)    Hypertension    Reflux    Wears hearing aid in both ears     Past Surgical History:  Procedure Laterality Date   cyst removed     EXCISION OF TONGUE LESION Bilateral 05/05/2019   Procedure: EXCISION OF DORSAL TONGUE  LESION;  Surgeon: Margaretha Sheffield, MD;  Location: [facility];  Service: ENT;  Laterality: Bilateral;   skin cancer removed       reports that he has never smoked. He has never used smokeless tobacco. He reports previous alcohol use. No history on file for drug.  Allergies  Allergen Reactions   Losartan Other (See Comments)   Chlorthalidone Nausea And Vomiting    Can tolerate if taking with Omeprazole   Keflex [Cephalexin]    Statins Rash    Myalgias, muscle pain/weakness   Sulfa Antibiotics Rash   Sulfamethoxazole-Trimethoprim Rash    No family history on file.   Prior to Admission medications   Medication Sig Start Date End Date Taking? Authorizing Provider  chlorthalidone (HYGROTON) 25 MG tablet Take 25 mg by mouth daily.   Yes [provider]  Cholecalciferol (VITAMIN D-3) 1000 UNITS CAPS Take 1,000 Units by mouth daily at 12 noon.    Yes [provider]  omeprazole (PRILOSEC) 20 MG capsule Take 20 mg by mouth daily at 12 noon. 12/14/18  Yes [provider]  fluticasone (FLONASE) 50 MCG/ACT nasal spray Place 2 sprays into both nostrils daily.  03/08/14   [provider]  montelukast (SINGULAIR) 10 MG tablet Take 10 mg by mouth at bedtime.    [provider]    Physical Exam: Vitals:   07/05/19 0743 07/05/19 0744  BP: 133/78   Pulse: 74   Resp: 16   Temp: 98.5  F (36.9 C)   TempSrc: Oral   SpO2: 100%   Weight:  72.6 kg  Height:  5\' 6"  (1.676 m)     Vitals:   07/05/19 0743 07/05/19 0744  BP: 133/78   Pulse: 74   Resp: 16   Temp: 98.5 F (36.9 C)   TempSrc: Oral   SpO2: 100%   Weight:  72.6 kg  Height:  5\' 6"  (1.676 m)    Constitutional: NAD, alert and oriented x 3 Eyes: PERRL, lids and conjunctivae normal ENMT: Mucous membranes are moist.  Neck: normal, supple, no masses, no thyromegaly Respiratory: clear to auscultation bilaterally, no wheezing, no crackles. Normal respiratory effort. No accessory  muscle use.  Cardiovascular: Regular rate and rhythm, no murmurs / rubs / gallops. No extremity edema. 2+ pedal pulses. No carotid bruits.  Abdomen: no tenderness, no masses palpated. No hepatosplenomegaly. Bowel sounds positive.  Musculoskeletal: no clubbing / cyanosis. No joint deformity upper and lower extremities.  Skin: no rashes, lesions, ulcers.  Neurologic: No gross focal neurologic deficit.  Able to move all extremities Psychiatric: Normal mood and affect.   Labs on Admission: I have personally reviewed following labs and imaging studies  CBC: Recent Labs  Lab 07/05/19 0759  WBC 7.7  NEUTROABS 4.7  HGB 15.5  HCT 45.0  MCV 85.4  PLT [id number]   Basic Metabolic Panel: Recent Labs  Lab 07/05/19 0759  NA 139  K 3.2*  CL 101  CO2 27  GLUCOSE 122*  BUN 22  CREATININE 1.19  CALCIUM 9.3   GFR: Estimated Creatinine Clearance: 48.4 mL/min (by C-G formula based on SCr of 1.19 mg/dL). Liver Function Tests: Recent Labs  Lab 07/05/19 0759  AST 29  ALT 24  ALKPHOS 49  BILITOT 1.1  PROT 7.3  ALBUMIN 4.4   No results for input(s): LIPASE, AMYLASE in the last 168 hours. No results for input(s): AMMONIA in the last 168 hours. Coagulation Profile: Recent Labs  Lab 07/05/19 0759  INR 1.1   Cardiac Enzymes: No results for input(s): CKTOTAL, CKMB, CKMBINDEX, TROPONINI in the last 168 hours. BNP (last 3 results) No results for input(s): PROBNP in the last 8760 hours. HbA1C: No results for input(s): HGBA1C in the last 72 hours. CBG: Recent Labs  Lab 07/05/19 0751  GLUCAP 111*   Lipid Profile: No results for input(s): CHOL, HDL, LDLCALC, TRIG, CHOLHDL, LDLDIRECT in the last 72 hours. Thyroid Function Tests: No results for input(s): TSH, T4TOTAL, FREET4, T3FREE, THYROIDAB in the last 72 hours. Anemia Panel: No results for input(s): VITAMINB12, FOLATE, FERRITIN, TIBC, IRON, RETICCTPCT in the last 72 hours. Urine analysis: No results found for: COLORURINE,  APPEARANCEUR, LABSPEC, PHURINE, GLUCOSEU, HGBUR, BILIRUBINUR, KETONESUR, PROTEINUR, UROBILINOGEN, NITRITE, LEUKOCYTESUR  Radiological Exams on Admission: CT HEAD WO CONTRAST  Result Date: 07/05/2019 CLINICAL DATA:  Dizziness EXAM: CT HEAD WITHOUT CONTRAST TECHNIQUE: Contiguous axial images were obtained from the base of the skull through the vertex without intravenous contrast. COMPARISON:  None. FINDINGS: Brain: There is no acute intracranial hemorrhage, mass effect, or edema. Gray-white differentiation is preserved. There is no extra-axial fluid collection. Ventricles and sulci are within normal limits in size and configuration. Minimal patchy hypoattenuation in the supratentorial white matter is nonspecific but may reflect minor chronic microvascular ischemic changes. Vascular: No hyperdense vessel or unexpected calcification. Skull: Calvarium is unremarkable. Sinuses/Orbits: No acute finding. Other: None. IMPRESSION: No acute intracranial hemorrhage, mass effect, or evidence of acute infarction. Electronically Signed   By: Addison Lank.D.  On: 07/05/2019 08:20    EKG: Independently reviewed.  Normal sinus rhythm  Assessment/Plan Principal Problem:   TIA (transient ischemic attack) Active Problems:   Hypercholesteremia   Essential hypertension   Hypokalemia    Transient ischemic attack Rule out an acute CVA Patient presented for evaluation of left-sided numbness and weakness involving his left leg without any speech impairment Patient not a candidate for TPA since he woke up with the symptoms Continue aspirin 325 mg and statins Allow for permissive hypertension Obtain MRI of the brain without contrast to rule out an acute infarct Obtain 2D echocardiogram to rule out cardiac thrombus as well as carotid Doppler We will request speech therapy consult physical therapy and occupational therapy consult.   Hypertension Hold all antihypertensive medications for now Allow for  permissive hypertension   Hypokalemia Supplement potassium   DVT prophylaxis: SCD Code Status: Full code Family Communication: Plan of care was discussed with patient at the bedside.  All questions and concerns have been addressed.  He verbalizes understanding and agrees with the plan. Disposition Plan: Back to previous home environment Consults called: Neurology     Collier Bullock MD [facility]     07/05/2019, 12:36 PM

## 2019-07-05 NOTE — ED Notes (Signed)
[place] staff at bedside.

## 2019-07-05 NOTE — Progress Notes (Signed)
OT Cancellation Note  Patient Details Name: Russell Herman MRN: [place]:[id number] DOB: [birth year]   Cancelled Treatment:    Reason Eval/Treat Not Completed: Other (comment). Thank you for the OT consult order received and chart reviewed. Pt noted to be pending MRI/neuro consult. Will continue to follow remotely and hold OT evaluation until imaging complete/GOC established.   Shara Blazing, M.S., OTR/L Ascom: [phone] 07/05/19, 12:43 PM

## 2019-07-05 NOTE — Evaluation (Signed)
Physical Therapy Evaluation Patient Details Name: Russell Herman MRN: [place]:[id number] DOB: 09/16/1945 Today's Date: 07/05/2019   History of Present Illness  74 y.o. male with medical history significant for dyslipidemia and hypertension who presents to the ER for evaluation of left-sided numbness and tingling in his left leg with inability to bear weight on his left lower extremity.  MRI reveals acute infarct.  Clinical Impression  Pt did well with PT exam and overall showed great effort with ambulation/mobility.  We initially tried standing/walking w/o AD but pt with too much buckling and lack of coordination/sensation made this an unsafe option.  With walker he was able to do 2 bouts of >150 ft but had consistent low grade buckling, toe drag and generally had decreased quality of motion on the L with slower and more deliberate cadence that did improve with increased practice/cuing.    Follow Up Recommendations Outpatient PT    Equipment Recommendations  Rolling walker with 5" wheels    Recommendations for Other Services       Precautions / Restrictions Precautions Precautions: Fall Restrictions Weight Bearing Restrictions: No      Mobility  Bed Mobility Overal bed mobility: Modified Independent             General bed mobility comments: Pt able to get to sitting EOB w/o assist, minimal extra time  Transfers Overall transfer level: Modified independent Equipment used: Rolling walker (2 wheeled)             General transfer comment: Pt leaning legs back on bed on first standing attempt w/o AD, did much better stability-wise with FWW  Ambulation/Gait Ambulation/Gait assistance: Min guard Gait Distance (Feet): 200 Feet (additional 150 ft after rest break) Assistive device: Rolling walker (2 wheeled)       General Gait Details: Pt with definite need of walker (trials w/o UE support and with single UE support, pt with very guarded and unsteady gait).  With walker he did  well but had inconsistent quality of motion with the R at time with light knee buckling, at times struggling with foot clearance, and general need to be very aware and deliberate with each step..   Stairs            Wheelchair Mobility    Modified Rankin (Stroke Patients Only)       Balance Overall balance assessment: Needs assistance Sitting-balance support: Single extremity supported Sitting balance-Leahy Scale: Good     Standing balance support: Bilateral upper extremity supported Standing balance-Leahy Scale: Fair Standing balance comment: Pt needed UE support to maintain balance, frequent low grade buckling in L knee t/o WBing efforts                             Pertinent Vitals/Pain Pain Assessment: No/denies pain    Home Living Family/patient expects to be discharged to:: Private residence Living Arrangements: Spouse/significant other Available Help at Discharge: Available 24 hours/day;Family Type of Home: [place]: Able to live on main level with bedroom/bathroom Home Equipment: Cane - quad      Prior Function Level of Independence: Independent         Comments: Pt was using a wood splitter last weeks, walks 1 mile 3x/week, active and independent     Hand Dominance        Extremity/Trunk Assessment   Upper Extremity Assessment Upper Extremity Assessment: LUE deficits/detail(R UE WNL) LUE Deficits /  Details: decreased sensation is biggest issue, some minimal coordination defecits.  Strength grossly 3+/5 t/o L UE LUE Sensation: decreased light touch;decreased proprioception    Lower Extremity Assessment Lower Extremity Assessment: LLE deficits/detail(R WNL) LLE Deficits / Details: again sensation/proprioception seems to be most significant defecit, grossly 4-/5 t/o  LLE Sensation: decreased light touch;decreased proprioception       Communication   Communication: No difficulties  Cognition Arousal/Alertness:  Awake/alert Behavior During Therapy: WFL for tasks assessed/performed Overall Cognitive Status: Within Functional Limits for tasks assessed                                        General Comments      Exercises     Assessment/Plan    PT Assessment Patient needs continued PT services  PT Problem List Decreased strength;Decreased range of motion;Decreased activity tolerance;Decreased balance;Decreased mobility;Decreased coordination;Decreased knowledge of use of DME;Decreased safety awareness       PT Treatment Interventions DME instruction;Gait training;Stair training;Functional mobility training;Therapeutic activities;Therapeutic exercise;Balance training;Neuromuscular re-education;Patient/family education    PT Goals (Current goals can be found in the Care Plan section)  Acute Rehab PT Goals Patient Stated Goal: get back to walking w/o AD PT Goal Formulation: With patient/family Time For Goal Achievement: 07/19/19 Potential to Achieve Goals: Good    Frequency 7X/week   Barriers to discharge        Co-evaluation               AM-PAC PT "6 Clicks" Mobility  Outcome Measure Help needed turning from your back to your side while in a flat bed without using bedrails?: None Help needed moving from lying on your back to sitting on the side of a flat bed without using bedrails?: None Help needed moving to and from a bed to a chair (including a wheelchair)?: None Help needed standing up from a chair using your arms (e.g., wheelchair or bedside chair)?: None Help needed to walk in hospital room?: A Little Help needed climbing 3-5 steps with a railing? : A Little 6 Click Score: 22    End of Session Equipment Utilized During Treatment: Gait belt Activity Tolerance: Patient tolerated treatment well Patient left: in bed(in ED hallway) Nurse Communication: Mobility status PT Visit Diagnosis: Muscle weakness (generalized) (M62.81);Other symptoms and signs  involving the nervous system (R29.898);Hemiplegia and hemiparesis;Other abnormalities of gait and mobility (R26.89) Hemiplegia - Right/Left: Left Hemiplegia - dominant/non-dominant: Non-dominant Hemiplegia - caused by: Cerebral infarction    Time: 1345-1425 PT Time Calculation (min) (ACUTE ONLY): 40 min   Charges:   PT Evaluation $PT Eval Moderate Complexity: 1 Mod PT Treatments $Gait Training: 8-22 mins        Kreg Shropshire, DPT 07/05/2019, 3:54 PM

## 2019-07-05 NOTE — ED Notes (Signed)
Attending provider at bedside

## 2019-07-05 NOTE — Consult Note (Signed)
Requesting Physician: Cinda Quest    Chief Complaint: Left sided weakness/numbness  I have been asked by Dr. Cinda Quest to see this patient in consultation for acute infarct.  HPI: Russell Herman is an 74 y.o. male with medical history significant for dyslipidemia and hypertension who presents to the ER for evaluation of left-sided numbness and weakness.  Patient reports going to bed last evening at baseline.  Awakened this morning with symptoms.  Was unable to ambulate unassisted.  Presented for evaluation .  Initial NIHSS of 3.  Date last known well: 07/04/2019 Time last known well: Time: 23:00 tPA Given: No: Outside time window  Past Medical History:  Diagnosis Date  . Allergy   . Arthritis   . Cancer ([place])   . GERD (gastroesophageal reflux disease)   . Hypertension   . Reflux   . Wears hearing aid in both ears     Past Surgical History:  Procedure Laterality Date  . cyst removed    . EXCISION OF TONGUE LESION Bilateral 05/05/2019   Procedure: EXCISION OF DORSAL TONGUE LESION;  Surgeon: Margaretha Sheffield, MD;  Location: [facility];  Service: ENT;  Laterality: Bilateral;  . skin cancer removed      Family history: Parents deceased.  Father with melanoma.  Mother with breast cancer.    Social History:  reports that he has never smoked. He has never used smokeless tobacco. He reports previous alcohol use. No history on file for drug.  Allergies:  Allergies  Allergen Reactions  . Chlorthalidone Nausea And Vomiting  . Losartan Other (See Comments)  . Keflex [Cephalexin]   . Statins Rash    Myalgias, muscle pain/weakness  . Sulfa Antibiotics Rash  . Sulfamethoxazole-Trimethoprim Rash    Medications: I have reviewed the patient's current medications. Prior to Admission medications   Medication Sig Start Date End Date Taking? Authorizing Provider  chlorthalidone (HYGROTON) 25 MG tablet Take 25 mg by mouth daily.   Yes [provider]  Cholecalciferol (VITAMIN D-3)  1000 UNITS CAPS Take 1,000 Units by mouth daily at 12 noon.    Yes [provider]  omeprazole (PRILOSEC) 20 MG capsule Take 20 mg by mouth daily at 12 noon. 12/14/18  Yes [provider]  fluticasone (FLONASE) 50 MCG/ACT nasal spray Place 2 sprays into both nostrils daily.  03/08/14   [provider]  montelukast (SINGULAIR) 10 MG tablet Take 10 mg by mouth at bedtime.    [provider]    ROS: History obtained from the patient  General ROS: negative for - chills, fatigue, fever, night sweats, weight gain or weight loss Psychological ROS: negative for - behavioral disorder, hallucinations, memory difficulties, mood swings or suicidal ideation Ophthalmic ROS: negative for - blurry vision, double vision, eye pain or loss of vision ENT ROS: negative for - epistaxis, nasal discharge, oral lesions, sore throat, tinnitus or vertigo Allergy and Immunology ROS: negative for - hives or itchy/watery eyes Hematological and Lymphatic ROS: negative for - bleeding problems, bruising or swollen lymph nodes Endocrine ROS: negative for - galactorrhea, hair pattern changes, polydipsia/polyuria or temperature intolerance Respiratory ROS: negative for - cough, hemoptysis, shortness of breath or wheezing Cardiovascular ROS: negative for - chest pain, dyspnea on exertion, edema or irregular heartbeat Gastrointestinal ROS: negative for - abdominal pain, diarrhea, hematemesis, nausea/vomiting or stool incontinence Genito-Urinary ROS: negative for - dysuria, hematuria, incontinence or urinary frequency/urgency Musculoskeletal ROS: negative for - joint swelling or muscular weakness Neurological ROS: as noted in HPI Dermatological ROS:  negative for rash and skin lesion changes  Physical Examination: Blood pressure 133/78, pulse 74, temperature 98.5 F (36.9 C), temperature source Oral, resp. rate 16, height 5\' 6"  (1.676 m), weight 72.6 kg, SpO2 100 %.  HEENT-  Normocephalic, no  lesions, without obvious abnormality.  Normal external eye and conjunctiva.  Normal TM's bilaterally.  Normal auditory canals and external ears. Normal external nose, mucus membranes and septum.  Normal pharynx. Cardiovascular- S1, S2 normal, pulses palpable throughout   Lungs- chest clear, no wheezing, rales, normal symmetric air entry Abdomen- soft, non-tender; bowel sounds normal; no masses,  no organomegaly Extremities- no edema Lymph-no adenopathy palpable Musculoskeletal-no joint tenderness, deformity or swelling Skin-warm and dry, no hyperpigmentation, vitiligo, or suspicious lesions  Neurological Examination   Mental Status: Alert, oriented, thought content appropriate.  Speech fluent without evidence of aphasia.  Able to follow 3 step commands without difficulty.  Left neglect Cranial Nerves: II: Visual fields grossly normal, pupils equal, round, reactive to light and accommodation III,IV, VI: ptosis not present, extra-ocular motions intact bilaterally V,VII: smile symmetric, facial light touch sensation normal bilaterally VIII: hearing normal bilaterally IX,X: gag reflex present XI: bilateral shoulder shrug XII: midline tongue extension Motor: Right : Upper extremity   5/5    Left:     Upper extremity   4/5  Lower extremity   5/5     Lower extremity   5-/5 Tone and bulk:normal tone throughout; no atrophy noted Sensory: Pinprick and light touch decreased in the LUE and LLE Deep Tendon Reflexes: Symmetric throughout Plantars: Right: mute   Left: mute Cerebellar: normal finger-to-noses and normal heel-to-shin testing bilaterally Gait: not tested due to safety concerns    Laboratory Studies:  Basic Metabolic Panel: Recent Labs  Lab 07/05/19 0759  NA 139  K 3.2*  CL 101  CO2 27  GLUCOSE 122*  BUN 22  CREATININE 1.19  CALCIUM 9.3    Liver Function Tests: Recent Labs  Lab 07/05/19 0759  AST 29  ALT 24  ALKPHOS 49  BILITOT 1.1  PROT 7.3  ALBUMIN 4.4   No  results for input(s): LIPASE, AMYLASE in the last 168 hours. No results for input(s): AMMONIA in the last 168 hours.  CBC: Recent Labs  Lab 07/05/19 0759  WBC 7.7  NEUTROABS 4.7  HGB 15.5  HCT 45.0  MCV 85.4  PLT 283    Cardiac Enzymes: No results for input(s): CKTOTAL, CKMB, CKMBINDEX, TROPONINI in the last 168 hours.  BNP: Invalid input(s): POCBNP  CBG: Recent Labs  Lab 07/05/19 0751  GLUCAP 111*    Microbiology: Results for orders placed or performed during the hospital encounter of 07/05/19  SARS Coronavirus 2 by RT PCR (hospital order, performed in [facility] hospital lab) Nasopharyngeal Nasopharyngeal Swab     Status: None   Collection Time: 07/05/19  9:14 AM   Specimen: Nasopharyngeal Swab  Result Value Ref Range Status   SARS Coronavirus 2 NEGATIVE NEGATIVE Final    Comment: (NOTE) SARS-CoV-2 target nucleic acids are NOT DETECTED. The SARS-CoV-2 RNA is generally detectable in upper and lower respiratory specimens during the acute phase of infection. The lowest concentration of SARS-CoV-2 viral copies this assay can detect is 250 copies / mL. A negative result does not preclude SARS-CoV-2 infection and should not be used as the sole basis for treatment or other patient management decisions.  A negative result may occur with improper specimen collection / handling, submission of specimen other than nasopharyngeal swab, presence of viral mutation(s)  within the areas targeted by this assay, and inadequate number of viral copies (<250 copies / mL). A negative result must be combined with clinical observations, patient history, and epidemiological information. Fact Sheet for Patients:   StrictlyIdeas.no Fact Sheet for Healthcare Providers: BankingDealers.co.za This test is not yet approved or cleared  by the [place] FDA and has been authorized for detection and/or diagnosis of SARS-CoV-2 by FDA under an  Emergency Use Authorization (EUA).  This EUA will remain in effect (meaning this test can be used) for the duration of the COVID-19 declaration under Section 564(b)(1) of the Act, 21 U.S.C. section 360bbb-3(b)(1), unless the authorization is terminated or revoked sooner. Performed at [facility], [address] [place], [place] [address]     Coagulation Studies: Recent Labs    07/05/19 0759  LABPROT 13.7  INR 1.1    Urinalysis: No results for input(s): COLORURINE, LABSPEC, PHURINE, GLUCOSEU, HGBUR, BILIRUBINUR, KETONESUR, PROTEINUR, UROBILINOGEN, NITRITE, LEUKOCYTESUR in the last 168 hours.  Invalid input(s): APPERANCEUR  Lipid Panel: No results found for: CHOL, TRIG, HDL, CHOLHDL, VLDL, LDLCALC  HgbA1C: No results found for: HGBA1C  Urine Drug Screen:  No results found for: LABOPIA, COCAINSCRNUR, LABBENZ, AMPHETMU, THCU, LABBARB  Alcohol Level: No results for input(s): ETH in the last 168 hours.  Other results: EKG: normal sinus rhythm at 74 bpm.  Imaging: CT HEAD WO CONTRAST  Result Date: 07/05/2019 CLINICAL DATA:  Dizziness EXAM: CT HEAD WITHOUT CONTRAST TECHNIQUE: Contiguous axial images were obtained from the base of the skull through the vertex without intravenous contrast. COMPARISON:  None. FINDINGS: Brain: There is no acute intracranial hemorrhage, mass effect, or edema. Gray-white differentiation is preserved. There is no extra-axial fluid collection. Ventricles and sulci are within normal limits in size and configuration. Minimal patchy hypoattenuation in the supratentorial white matter is nonspecific but may reflect minor chronic microvascular ischemic changes. Vascular: No hyperdense vessel or unexpected calcification. Skull: Calvarium is unremarkable. Sinuses/Orbits: No acute finding. Other: None. IMPRESSION: No acute intracranial hemorrhage, mass effect, or evidence of acute infarction. Electronically Signed   By: Macy Mis M.D.   On: 07/05/2019  08:20    Assessment: 74 y.o. male with medical history significant for dyslipidemia and hypertension who presents to the ER for evaluation of left-sided numbness and weakness.  Patient outside time window for tPA.  On no antiplatelet therapy prior to admission.  BP controlled.  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  Head CT personally reviewed and shows no acute changes.  Further work up recommended.    Stroke Risk Factors - hyperlipidemia and hypertension  Plan: 1. HgbA1c, fasting lipid panel 2. MRI, MRA  of the brain without contrast 3. PT consult, OT consult, Speech consult 4. Echocardiogram 5. Carotid dopplers 6. Prophylactic therapy-Dual antiplatelet therapy with ASA 81mg  and Plavix 75mg  for three weeks with change to ASA 81mg  daily alone as monotherapy after that time. 7. NPO until RN stroke swallow screen 8. Telemetry monitoring 9. Frequent neuro checks   Alexis Goodell, MD Neurology [phone] 07/05/2019, 11:08 AM

## 2019-07-05 NOTE — ED Notes (Signed)
Pt/family briefly updated. Delay on bed assignment explained. Pt/family understanding. Pt wheeled to and from restroom by this RN.

## 2019-07-05 NOTE — ED Notes (Signed)
Pt alert and resting calmly in bed. Bed locked low. Rail up. Family remains with pt.

## 2019-07-05 NOTE — Progress Notes (Signed)
SLP Cancellation Note  Patient Details Name: Russell Herman MRN: [id number] DOB: [birth year]   Cancelled treatment:       Reason Eval/Treat Not Completed: Other (comment); Per nursing staff notes, patient receiving MRI today. Will eval/screen following MRI if skilled ST services are still warranted/appropriate.   Loni Beckwith, M.S. CCC-SLP Speech-Language Pathologist   Loni Beckwith 07/05/2019, 11:56 AM

## 2019-07-05 NOTE — ED Notes (Addendum)
Pt assisted to and from bed to restroom. L leg remains weak. Pt notified PT called and is waiting to work with him until his MRI is completed.

## 2019-07-05 NOTE — ED Notes (Signed)
Pt given lunch tray. Repositioned in bed.

## 2019-07-05 NOTE — ED Notes (Signed)
Pt/family briefly updated. Pt resting calmly in bed. Bed locked low. Rail up.

## 2019-07-05 NOTE — ED Notes (Signed)
Pt currently on this RN's ascom to answer MRI screening questions.

## 2019-07-06 ENCOUNTER — Encounter: Payer: Self-pay | Admitting: Internal Medicine

## 2019-07-06 ENCOUNTER — Observation Stay: Payer: Medicare Other

## 2019-07-06 ENCOUNTER — Observation Stay (HOSPITAL_COMMUNITY)
Admit: 2019-07-06 | Discharge: 2019-07-06 | Disposition: A | Discharge status: Home / Self Care | Payer: Medicare Other | Attending: Neurology | Admitting: Neurology

## 2019-07-06 DIAGNOSIS — Z888 Allergy status to other drugs, medicaments and biological substances status: Secondary | ICD-10-CM | POA: Diagnosis not present

## 2019-07-06 DIAGNOSIS — I6523 Occlusion and stenosis of bilateral carotid arteries: Secondary | ICD-10-CM | POA: Diagnosis present

## 2019-07-06 DIAGNOSIS — Z79899 Other long term (current) drug therapy: Secondary | ICD-10-CM | POA: Diagnosis not present

## 2019-07-06 DIAGNOSIS — M47812 Spondylosis without myelopathy or radiculopathy, cervical region: Secondary | ICD-10-CM | POA: Diagnosis present

## 2019-07-06 DIAGNOSIS — I10 Essential (primary) hypertension: Secondary | ICD-10-CM | POA: Diagnosis present

## 2019-07-06 DIAGNOSIS — E785 Hyperlipidemia, unspecified: Secondary | ICD-10-CM | POA: Diagnosis present

## 2019-07-06 DIAGNOSIS — I6389 Other cerebral infarction: Secondary | ICD-10-CM

## 2019-07-06 DIAGNOSIS — Z803 Family history of malignant neoplasm of breast: Secondary | ICD-10-CM | POA: Diagnosis not present

## 2019-07-06 DIAGNOSIS — G459 Transient cerebral ischemic attack, unspecified: Secondary | ICD-10-CM | POA: Diagnosis present

## 2019-07-06 DIAGNOSIS — I63532 Cerebral infarction due to unspecified occlusion or stenosis of left posterior cerebral artery: Secondary | ICD-10-CM | POA: Diagnosis not present

## 2019-07-06 DIAGNOSIS — K219 Gastro-esophageal reflux disease without esophagitis: Secondary | ICD-10-CM | POA: Diagnosis present

## 2019-07-06 DIAGNOSIS — Z20822 Contact with and (suspected) exposure to covid-19: Secondary | ICD-10-CM | POA: Diagnosis present

## 2019-07-06 DIAGNOSIS — E7800 Pure hypercholesterolemia, unspecified: Secondary | ICD-10-CM | POA: Diagnosis present

## 2019-07-06 DIAGNOSIS — I639 Cerebral infarction, unspecified: Secondary | ICD-10-CM | POA: Diagnosis present

## 2019-07-06 DIAGNOSIS — I739 Peripheral vascular disease, unspecified: Secondary | ICD-10-CM | POA: Diagnosis present

## 2019-07-06 DIAGNOSIS — E559 Vitamin D deficiency, unspecified: Secondary | ICD-10-CM | POA: Diagnosis present

## 2019-07-06 DIAGNOSIS — G8314 Monoplegia of lower limb affecting left nondominant side: Secondary | ICD-10-CM | POA: Diagnosis present

## 2019-07-06 DIAGNOSIS — I779 Disorder of arteries and arterioles, unspecified: Secondary | ICD-10-CM | POA: Diagnosis not present

## 2019-07-06 DIAGNOSIS — Z808 Family history of malignant neoplasm of other organs or systems: Secondary | ICD-10-CM | POA: Diagnosis not present

## 2019-07-06 DIAGNOSIS — Z882 Allergy status to sulfonamides status: Secondary | ICD-10-CM | POA: Diagnosis not present

## 2019-07-06 DIAGNOSIS — Z85828 Personal history of other malignant neoplasm of skin: Secondary | ICD-10-CM | POA: Diagnosis not present

## 2019-07-06 DIAGNOSIS — R29703 NIHSS score 3: Secondary | ICD-10-CM | POA: Diagnosis present

## 2019-07-06 DIAGNOSIS — E876 Hypokalemia: Secondary | ICD-10-CM | POA: Diagnosis present

## 2019-07-06 LAB — HEMOGLOBIN A1C
Hgb A1c MFr Bld: 6.2 % — ABNORMAL HIGH (ref 4.8–5.6)
Mean Plasma Glucose: 131.24 mg/dL

## 2019-07-06 LAB — LIPID PANEL
Cholesterol: 193 mg/dL (ref 0–200)
HDL: 33 mg/dL — ABNORMAL LOW (ref >40)
LDL Cholesterol: 139 mg/dL — ABNORMAL HIGH (ref 0–99)
Total CHOL/HDL Ratio: 5.8 RATIO
Triglycerides: 103 mg/dL (ref <150)
VLDL: 21 mg/dL (ref 0–40)

## 2019-07-06 MED ORDER — IOHEXOL 350 MG/ML SOLN
75.0000 mL | Freq: Once | INTRAVENOUS | Status: AC | PRN
  Administered 2019-07-06: 11:00:00 75 mL via INTRAVENOUS

## 2019-07-06 MED ORDER — CLOPIDOGREL BISULFATE 75 MG PO TABS
75.0000 mg | ORAL_TABLET | Freq: Every day | ORAL | Status: DC
  Administered 2019-07-06: 15:00:00 75 mg via ORAL
  Filled 2019-07-06: qty 1

## 2019-07-06 MED ORDER — ROSUVASTATIN CALCIUM 20 MG PO TABS
40.0000 mg | ORAL_TABLET | Freq: Every day | ORAL | Status: DC
  Administered 2019-07-06: 40 mg via ORAL
  Filled 2019-07-06: qty 2

## 2019-07-06 MED ORDER — PANTOPRAZOLE SODIUM 40 MG PO TBEC: 40.0000 mg | DELAYED_RELEASE_TABLET | Freq: Every day | ORAL | 0 refills | Status: DC

## 2019-07-06 MED ORDER — ASPIRIN EC 81 MG PO TBEC: 81.0000 mg | DELAYED_RELEASE_TABLET | Freq: Every day | ORAL | Status: DC

## 2019-07-06 MED ORDER — ASPIRIN 81 MG PO TBEC: 81.0000 mg | DELAYED_RELEASE_TABLET | Freq: Every day | ORAL | 0 refills | Status: DC

## 2019-07-06 MED ORDER — CLOPIDOGREL BISULFATE 75 MG PO TABS: 75.0000 mg | ORAL_TABLET | Freq: Every day | ORAL | 0 refills | Status: DC

## 2019-07-06 MED ORDER — POTASSIUM CHLORIDE CRYS ER 20 MEQ PO TBCR
40.0000 meq | EXTENDED_RELEASE_TABLET | Freq: Once | ORAL | Status: AC
  Administered 2019-07-06: 40 meq via ORAL
  Filled 2019-07-06: qty 2

## 2019-07-06 MED ORDER — POTASSIUM CHLORIDE CRYS ER 20 MEQ PO TBCR
40.0000 meq | EXTENDED_RELEASE_TABLET | ORAL | Status: DC
  Administered 2019-07-06: 15:00:00 40 meq via ORAL
  Filled 2019-07-06: qty 2

## 2019-07-06 MED ORDER — ROSUVASTATIN CALCIUM 40 MG PO TABS: 40.0000 mg | ORAL_TABLET | Freq: Every day | ORAL | 0 refills | Status: DC

## 2019-07-06 NOTE — Progress Notes (Signed)
Patient to CT. Madlyn Frankel, RN

## 2019-07-06 NOTE — Consult Note (Signed)
[facility] Vascular Consult Note  MRN : [id number]  Russell Herman is a 74 y.o. (26-Feb-1945) male who presents with chief complaint of  Chief Complaint  Patient presents with   Numbness   Dizziness   History of Present Illness:  The patient is a 74 year old male with a past medical history of hypercholesteremia, hypertension, vitamin D deficiency and basal cell carcinoma who presented to the [facility] emergency department with a chief complaint of left-sided numbness and tingling.  Patient reports going to bed last evening at baseline.  Awakened yesterday morning with symptoms.  Was unable to ambulate unassisted.    Noted left-sided weakness and tingling.  Symptoms have resolved this afternoon.  Denies any TIA-like symptoms in the past.  Denies any fever, nausea vomiting.  Denies any shortness of breath or chest pain.  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 78% diameter stenosis proximal right internal carotid artery due to calcified and noncalcified atherosclerotic plaque. 2. No significant left carotid stenosis 3. Mild stenosis origin of right vertebral artery. Left vertebral artery widely patent.  Vascular surgery was consulted by Dr. Roosevelt Locks for possible surgical intervention.  Current Facility-Administered Medications  Medication Dose Route Frequency Provider Last Rate Last Admin   acetaminophen (TYLENOL) tablet 650 mg  650 mg Oral Q4H PRN Agbata, Tochukwu, MD       Or   acetaminophen (TYLENOL) 160 MG/5ML solution 650 mg  650 mg Per Tube Q4H PRN Agbata, Tochukwu, MD       Or   acetaminophen (TYLENOL) suppository 650 mg  650 mg Rectal Q4H PRN Agbata, Tochukwu, MD       [START ON 07/07/2019] aspirin EC tablet 81 mg  81 mg Oral Daily Sharen Hones, MD       clopidogrel (PLAVIX) tablet 75 mg  75 mg Oral Daily Zhang, Dekui, MD       fluticasone (FLONASE) 50 MCG/ACT nasal spray 1 spray  1 spray Each  Nare Daily Agbata, Tochukwu, MD       montelukast (SINGULAIR) tablet 10 mg  10 mg Oral QHS Agbata, Tochukwu, MD       pantoprazole (PROTONIX) EC tablet 40 mg  40 mg Oral Daily Agbata, Tochukwu, MD   40 mg at 07/06/19 0920   rosuvastatin (CRESTOR) tablet 40 mg  40 mg Oral QHS Sharen Hones, MD       Past Medical History:  Diagnosis Date   Allergy    Arthritis    Cancer ([place])    GERD (gastroesophageal reflux disease)    Hypertension    Reflux    Wears hearing aid in both ears    Past Surgical History:  Procedure Laterality Date   cyst removed     EXCISION OF TONGUE LESION Bilateral 05/05/2019   Procedure: EXCISION OF DORSAL TONGUE LESION;  Surgeon: Margaretha Sheffield, MD;  Location: [facility];  Service: ENT;  Laterality: Bilateral;   skin cancer removed     Social History Social History   Tobacco Use   Smoking status: Never Smoker   Smokeless tobacco: Never Used   Tobacco comment: smoked "some" as teenager  Substance Use Topics   Alcohol use: Not Currently    Alcohol/week: 0.0 standard drinks   Drug use: Not on file   Family History No family history on file.  Denies family history of peripheral artery disease, venous disease or renal disease.  Allergies  Allergen Reactions   Losartan Other (  See Comments)   Chlorthalidone Nausea And Vomiting    Can tolerate if taking with Omeprazole   Keflex [Cephalexin]    Statins Rash    Myalgias, muscle pain/weakness   Sulfa Antibiotics Rash   Sulfamethoxazole-Trimethoprim Rash   REVIEW OF SYSTEMS (Negative unless checked)  Constitutional: [] Weight loss  [] Fever  [] Chills Cardiac: [] Chest pain   [] Chest pressure   [] Palpitations   [] Shortness of breath when laying flat   [] Shortness of breath at rest   [] Shortness of breath with exertion. Vascular:  [] Pain in legs with walking   [] Pain in legs at rest   [] Pain in legs when laying flat   [] Claudication   [] Pain in feet when walking  [] Pain in feet at rest   [] Pain in feet when laying flat   [] History of DVT   [] Phlebitis   [] Swelling in legs   [] Varicose veins   [] Non-healing ulcers Pulmonary:   [] Uses home oxygen   [] Productive cough   [] Hemoptysis   [] Wheeze  [] COPD   [] Asthma Neurologic:  [] Dizziness  [] Blackouts   [] Seizures   [x] History of stroke   [] History of TIA  [] Aphasia   [] Temporary blindness   [] Dysphagia   [x] Weakness or numbness in arms   [x] Weakness or numbness in legs Musculoskeletal:  [] Arthritis   [] Joint swelling   [] Joint pain   [] Low back pain Hematologic:  [] Easy bruising  [] Easy bleeding   [] Hypercoagulable state   [] Anemic  [] Hepatitis Gastrointestinal:  [] Blood in stool   [] Vomiting blood  [] Gastroesophageal reflux/heartburn   [] Difficulty swallowing. Genitourinary:  [] Chronic kidney disease   [] Difficult urination  [] Frequent urination  [] Burning with urination   [] Blood in urine Skin:  [] Rashes   [] Ulcers   [] Wounds Psychological:  [] History of anxiety   []  History of major depression.  Physical Examination  Vitals:   07/05/19 1816 07/05/19 2032 07/05/19 2043 07/06/19 0500  BP:  121/67 134/77 129/74  Pulse:  75 73 73  Resp: 20 18 16 20   Temp:  98.6 F (37 C) 98 F (36.7 C) 97.9 F (36.6 C)  TempSrc:  Oral Oral Oral  SpO2:  97% 99% 98%  Weight:   72 kg   Height:   5\' 6"  (1.676 m)    Body mass index is 25.61 kg/m. Gen:  WD/WN, NAD Head: /AT, No temporalis wasting. Prominent temp pulse not noted. Ear/Nose/Throat: Hearing grossly intact, nares w/o erythema or drainage, oropharynx w/o Erythema/Exudate Eyes: Sclera non-icteric, conjunctiva clear Neck: Trachea midline.  No JVD.  Right-sided bruit noted. Pulmonary:  Good air movement, respirations not labored, equal bilaterally.  Cardiac: RRR, normal S1, S2. Vascular:  Vessel Right Left  Radial Palpable Palpable  Ulnar Palpable Palpable  Brachial Palpable Palpable  Carotid Palpable, without bruit Palpable, without bruit  Aorta Not palpable N/A  Femoral  Palpable Palpable  Popliteal Palpable Palpable  PT Palpable Palpable  DP Palpable Palpable   Gastrointestinal: soft, non-tender/non-distended. No guarding/reflex.  Musculoskeletal: M/S 5/5 throughout.  Extremities without ischemic changes.  No deformity or atrophy. No edema. Neurologic: Sensation grossly intact in extremities.  Symmetrical.  Speech is fluent. Motor exam as listed above. Psychiatric: Judgment intact, Mood & affect appropriate for pt's clinical situation. Dermatologic: No rashes or ulcers noted.  No cellulitis or open wounds. Lymph : No Cervical, Axillary, or Inguinal lymphadenopathy.  CBC Lab Results  Component Value Date   WBC 7.7 07/05/2019   HGB 15.5 07/05/2019   HCT 45.0 07/05/2019   MCV 85.4 07/05/2019   PLT 283  07/05/2019   BMET    Component Value Date/Time   NA 139 07/05/2019 0759   K 3.2 (L) 07/05/2019 0759   CL 101 07/05/2019 0759   CO2 27 07/05/2019 0759   GLUCOSE 122 (H) 07/05/2019 0759   BUN 22 07/05/2019 0759   CREATININE 1.19 07/05/2019 0759   CALCIUM 9.3 07/05/2019 0759   GFRNONAA 59 (L) 07/05/2019 0759   GFRAA >60 07/05/2019 0759   Estimated Creatinine Clearance: 48.4 mL/min (by C-G formula based on SCr of 1.19 mg/dL).  COAG Lab Results  Component Value Date   INR 1.1 07/05/2019   Radiology CT HEAD WO CONTRAST  Result Date: 07/05/2019 CLINICAL DATA:  Dizziness EXAM: CT HEAD WITHOUT CONTRAST TECHNIQUE: Contiguous axial images were obtained from the base of the skull through the vertex without intravenous contrast. COMPARISON:  None. FINDINGS: Brain: There is no acute intracranial hemorrhage, mass effect, or edema. Gray-white differentiation is preserved. There is no extra-axial fluid collection. Ventricles and sulci are within normal limits in size and configuration. Minimal patchy hypoattenuation in the supratentorial white matter is nonspecific but may reflect minor chronic microvascular ischemic changes. Vascular: No hyperdense vessel or  unexpected calcification. Skull: Calvarium is unremarkable. Sinuses/Orbits: No acute finding. Other: None. IMPRESSION: No acute intracranial hemorrhage, mass effect, or evidence of acute infarction. Electronically Signed   By: Macy Mis M.D.   On: 07/05/2019 08:20   CT ANGIO NECK W OR WO CONTRAST  Result Date: 07/06/2019 CLINICAL DATA:  Carotid stenosis.  Right thalamic stroke. EXAM: CT ANGIOGRAPHY NECK TECHNIQUE: Multidetector CT imaging of the neck was performed using the standard protocol during bolus administration of intravenous contrast. Multiplanar CT image reconstructions and MIPs were obtained to evaluate the vascular anatomy. Carotid stenosis measurements (when applicable) are obtained utilizing NASCET criteria, using the distal internal carotid diameter as the denominator. CONTRAST:  22mL OMNIPAQUE IOHEXOL 350 MG/ML SOLN COMPARISON:  Carotid Doppler 07/05/2019 FINDINGS: Aortic arch: Standard branching. Imaged portion shows no evidence of aneurysm or dissection. No significant stenosis of the major arch vessel origins. Mild atherosclerotic calcification aortic arch. Right carotid system: Calcified and noncalcified plaque right carotid bifurcation. Minimal luminal diameter on the sagittal view is 0.86 mm corresponding to 78% diameter stenosis. No intraluminal filling defect. Remainder of the right internal carotid artery is patent. Left carotid system: Mild atherosclerotic disease left carotid bifurcation without stenosis. Vertebral arteries: Both vertebral arteries patent to the basilar. Mild stenosis proximal right vertebral artery. No significant left vertebral stenosis. Skeleton: Cervical spondylosis diffusely. No focal or acute skeletal abnormality. Other neck: Negative for mass or adenopathy in the neck. Upper chest: Lung apices clear bilaterally. IMPRESSION: 1. 78% diameter stenosis proximal right internal carotid artery due to calcified and noncalcified atherosclerotic plaque. 2. No  significant left carotid stenosis 3. Mild stenosis origin of right vertebral artery. Left vertebral artery widely patent. Electronically Signed   By: Franchot Gallo M.D.   On: 07/06/2019 11:36   MR BRAIN WO CONTRAST  Result Date: 07/05/2019 CLINICAL DATA:  Acute presentation with left-sided numbness and tingling affecting the leg. EXAM: MRI HEAD WITHOUT CONTRAST TECHNIQUE: Multiplanar, multiecho pulse sequences of the brain and surrounding structures were obtained without intravenous contrast. COMPARISON:  Head CT same day FINDINGS: Brain: Diffusion imaging shows a small area of acute infarction affecting the lateral thalamus on the right. No sign of swelling or hemorrhage. No other acute finding. Elsewhere, there are mild chronic small-vessel changes of the hemispheric white matter. No large vessel territory infarction. No mass lesion,  hemorrhage, hydrocephalus or extra-axial collection. Vascular: Major vessels at the base of the brain show flow. Skull and upper cervical spine: Negative Sinuses/Orbits: Clear/normal Other: None IMPRESSION: Small area of acute infarction in the lateral right thalamus. Mild chronic small-vessel change of the white matter otherwise. No hemorrhage or mass effect. Electronically Signed   By: Nelson Chimes M.D.   On: 07/05/2019 13:39   US Carotid Bilateral (at [facility] and [facility] only)  Result Date: 07/05/2019 CLINICAL DATA:  Carotid artery disease. Acute infarction involving the right lateral thalamus. EXAM: BILATERAL CAROTID DUPLEX ULTRASOUND TECHNIQUE: Pearline Cables scale imaging, color Doppler and duplex ultrasound were performed of bilateral carotid and vertebral arteries in the neck. COMPARISON:  None. FINDINGS: Criteria: Quantification of carotid stenosis is based on velocity parameters that correlate the residual internal carotid diameter with NASCET-based stenosis levels, using the diameter of the distal internal carotid lumen as the denominator for stenosis measurement. The following  velocity measurements were obtained: RIGHT ICA: 247/58 cm/sec CCA: [id number] cm/sec SYSTOLIC ICA/CCA RATIO:  3.5 ECA: 150 cm/sec LEFT ICA: 100/33 cm/sec CCA: [id number] cm/sec SYSTOLIC ICA/CCA RATIO:  1.6 ECA: 85 cm/sec RIGHT CAROTID ARTERY: Echogenic and heterogeneous plaque at the right carotid bulb. External carotid artery is patent with normal waveform. Irregular heterogeneous plaque at the proximal aspect of the internal carotid artery. Evidence for stenosis and turbulent flow in the proximal internal carotid artery. Elevated peak systolic velocity in the proximal internal carotid artery measures 247 cm/sec and end-diastolic velocity measures 58 cm/sec. Mid and distal right internal carotid artery are patent. RIGHT VERTEBRAL ARTERY: Antegrade flow and normal waveform in the right vertebral artery. LEFT CAROTID ARTERY: Mild atherosclerotic disease at the left carotid bulb. Minimal plaque near the proximal external carotid artery. External carotid artery is patent with normal waveform. Small amount of plaque in the proximal internal carotid artery. Normal waveforms and velocities in the internal carotid artery. LEFT VERTEBRAL ARTERY: Antegrade flow and normal waveform in the left vertebral artery. IMPRESSION: 1. Atherosclerotic plaque involving the right carotid bulb and proximal right internal carotid artery. Estimated degree of stenosis in the right internal carotid artery is greater than 70%. 2. Mild plaque in the left carotid arteries. Estimated degree of stenosis in left internal carotid artery is less than 50%. 3. Patent vertebral arteries with antegrade flow. Electronically Signed   By: Markus Daft M.D.   On: 07/05/2019 15:34   Assessment/Plan The patient is a 74 year old male with a past medical history of hypercholesteremia, hypertension, vitamin D deficiency and basal cell carcinoma who presented to the [facility] emergency department with a chief complaint of left-sided numbness and  tingling.  1. Carotid stenosis: Patient presents with left-sided numbness and tingling which have now resolved.  CTA of the neck conducted on July 10, 2019:  78% diameter stenosis proximal right internal carotid artery due to calcified and noncalcified atherosclerotic plaque.  Patient also noted to have a an acute right traumatic stroke on MRI of the head.  In the setting of an acute stroke we will have to wait approximately 2 to 3 weeks to move forward with repair.  Based on the patient's anatomy and degree of atherosclerotic disease recommend a right carotid endarterectomy  Procedure, risks and benefits to the patient and his wife was at the bedside.  All questions answered.  The patient wishes to proceed.  Our office scheduler Devona Konig, will reach out to the patient in regard to scheduling cardiac clearance.  The patient's cardiologist Dr. Saralyn Pilar.  2. Hypertension: On appropriate medications. Encouraged good control as its slows the progression of atherosclerotic disease.  3.  Hypercholesteremia: On aspirin and statin. Encouraged good control as its slows the progression of atherosclerotic disease.  [place], PA-C  07/06/2019 1:04 PM  This note was created with Dragon medical transcription system.  Any error is purely unintentional

## 2019-07-06 NOTE — Progress Notes (Signed)
Inpatient Rehab Admissions:  Inpatient Rehab Consult received.  I spoke with pt's attending physician and his wife over the phone.  Pt is listed as observation status at this time, and not expected to qualify for inpatient status.  As such, he would not qualify for CIR.  Will sign off at this time.  CSW aware.   Signed: Shann Medal, PT, DPT Admissions Coordinator [phone] 07/06/19  4:41 PM

## 2019-07-06 NOTE — Care Management Obs Status (Signed)
[place] NOTIFICATION   Patient Details  Name: Russell Herman MRN: [id number] Date of Birth: 09-04-1945   Medicare Observation Status Notification Given:  Yes    Shelbie Hutching, RN 07/06/2019, 12:26 PM

## 2019-07-06 NOTE — Progress Notes (Signed)
Inpatient Rehab Admissions Coordinator Note:   Per PT updated recommendations, pt was screened for CIR candidacy by Shann Medal, PT, DPT.  Note that pt already mobilizing at mod I for bed mobility and transfers, and min guard x160'-200' with RW, and may be too high level for CIR, but we will request formal consult to fully evaluate.  Please contact me with questions.   Shann Medal, PT, DPT [phone] 07/06/19 11:01 AM

## 2019-07-06 NOTE — Progress Notes (Signed)
*  PRELIMINARY RESULTS* Echocardiogram 2D Echocardiogram has been performed.  Sherrie Sport 07/06/2019, 9:03 AM

## 2019-07-06 NOTE — Progress Notes (Signed)
Patient had orders for discharge and while I was going over discharge instructions wife was upset because they had not been delivered a walker and she did not feel patient was ready for discharge because of patients stroke and he could not walk without the walker. Contacted Dr. Roosevelt Locks about same and he said to take out discharge orders and admit patient back to hospital on med/surg.

## 2019-07-06 NOTE — Progress Notes (Signed)
Subjective:  Pt appears back to baseline and no new complaints this AM  Past Medical History:  Diagnosis Date  . Allergy   . Arthritis   . Cancer ([place])   . GERD (gastroesophageal reflux disease)   . Hypertension   . Reflux   . Wears hearing aid in both ears     Past Surgical History:  Procedure Laterality Date  . cyst removed    . EXCISION OF TONGUE LESION Bilateral 05/05/2019   Procedure: EXCISION OF DORSAL TONGUE LESION;  Surgeon: Margaretha Sheffield, MD;  Location: [facility];  Service: ENT;  Laterality: Bilateral;  . skin cancer removed      Family history: Parents deceased.  Father with melanoma.  Mother with breast cancer.    Social History:  reports that he has never smoked. He has never used smokeless tobacco. He reports previous alcohol use. No history on file for drug.  Allergies:  Allergies  Allergen Reactions  . Losartan Other (See Comments)  . Chlorthalidone Nausea And Vomiting    Can tolerate if taking with Omeprazole  . Keflex [Cephalexin]   . Statins Rash    Myalgias, muscle pain/weakness  . Sulfa Antibiotics Rash  . Sulfamethoxazole-Trimethoprim Rash    Medications: I have reviewed the patient's current medications. Prior to Admission medications   Medication Sig Start Date End Date Taking? Authorizing Provider  chlorthalidone (HYGROTON) 25 MG tablet Take 25 mg by mouth daily.   Yes [provider]  Cholecalciferol (VITAMIN D-3) 1000 UNITS CAPS Take 1,000 Units by mouth daily at 12 noon.    Yes [provider]  omeprazole (PRILOSEC) 20 MG capsule Take 20 mg by mouth daily at 12 noon. 12/14/18  Yes [provider]  fluticasone (FLONASE) 50 MCG/ACT nasal spray Place 2 sprays into both nostrils daily.  03/08/14   [provider]  montelukast (SINGULAIR) 10 MG tablet Take 10 mg by mouth at bedtime.    [provider]    Physical Examination: Blood pressure 129/74, pulse 73, temperature 97.9 F (36.6 C),  temperature source Oral, resp. rate 20, height 5\' 6"  (1.676 m), weight 72 kg, SpO2 98 %.   Neurological Examination   Mental Status: Alert, oriented, thought content appropriate.  Speech fluent without evidence of aphasia.  Able to follow 3 step commands without difficulty.  Left neglect Cranial Nerves: II: Visual fields grossly normal, pupils equal, round, reactive to light and accommodation III,IV, VI: ptosis not present, extra-ocular motions intact bilaterally V,VII: smile symmetric, facial light touch sensation normal bilaterally VIII: hearing normal bilaterally IX,X: gag reflex present XI: bilateral shoulder shrug XII: midline tongue extension Motor: Right : Upper extremity   5/5    Left:     Upper extremity   5-/5  Lower extremity   5/5     Lower extremity   5/5 Tone and bulk:normal tone throughout; no atrophy noted Sensory: Pinprick and light touch decreased in the LUE and LLE Deep Tendon Reflexes: Symmetric throughout Plantars: Right: mute   Left: mute Cerebellar: normal finger-to-noses and normal heel-to-shin testing bilaterally Gait: not tested   Laboratory Studies:  Basic Metabolic Panel: Recent Labs  Lab 07/05/19 0759  NA 139  K 3.2*  CL 101  CO2 27  GLUCOSE 122*  BUN 22  CREATININE 1.19  CALCIUM 9.3    Liver Function Tests: Recent Labs  Lab 07/05/19 0759  AST 29  ALT 24  ALKPHOS 49  BILITOT 1.1  PROT 7.3  ALBUMIN 4.4   No  results for input(s): LIPASE, AMYLASE in the last 168 hours. No results for input(s): AMMONIA in the last 168 hours.  CBC: Recent Labs  Lab 07/05/19 0759  WBC 7.7  NEUTROABS 4.7  HGB 15.5  HCT 45.0  MCV 85.4  PLT 283    Cardiac Enzymes: No results for input(s): CKTOTAL, CKMB, CKMBINDEX, TROPONINI in the last 168 hours.  BNP: Invalid input(s): POCBNP  CBG: Recent Labs  Lab 07/05/19 0751  GLUCAP 111*    Microbiology: Results for orders placed or performed during the hospital encounter of 07/05/19  SARS  Coronavirus 2 by RT PCR (hospital order, performed in [facility] hospital lab) Nasopharyngeal Nasopharyngeal Swab     Status: None   Collection Time: 07/05/19  9:14 AM   Specimen: Nasopharyngeal Swab  Result Value Ref Range Status   SARS Coronavirus 2 NEGATIVE NEGATIVE Final    Comment: (NOTE) SARS-CoV-2 target nucleic acids are NOT DETECTED. The SARS-CoV-2 RNA is generally detectable in upper and lower respiratory specimens during the acute phase of infection. The lowest concentration of SARS-CoV-2 viral copies this assay can detect is 250 copies / mL. A negative result does not preclude SARS-CoV-2 infection and should not be used as the sole basis for treatment or other patient management decisions.  A negative result may occur with improper specimen collection / handling, submission of specimen other than nasopharyngeal swab, presence of viral mutation(s) within the areas targeted by this assay, and inadequate number of viral copies (<250 copies / mL). A negative result must be combined with clinical observations, patient history, and epidemiological information. Fact Sheet for Patients:   StrictlyIdeas.no Fact Sheet for Healthcare Providers: BankingDealers.co.za This test is not yet approved or cleared  by the [place] FDA and has been authorized for detection and/or diagnosis of SARS-CoV-2 by FDA under an Emergency Use Authorization (EUA).  This EUA will remain in effect (meaning this test can be used) for the duration of the COVID-19 declaration under Section 564(b)(1) of the Act, 21 U.S.C. section 360bbb-3(b)(1), unless the authorization is terminated or revoked sooner. Performed at [facility], [address] [place], [place] [address]     Coagulation Studies: Recent Labs    07/05/19 0759  LABPROT 13.7  INR 1.1    Urinalysis: No results for input(s): COLORURINE, LABSPEC, PHURINE, GLUCOSEU, HGBUR,  BILIRUBINUR, KETONESUR, PROTEINUR, UROBILINOGEN, NITRITE, LEUKOCYTESUR in the last 168 hours.  Invalid input(s): APPERANCEUR  Lipid Panel:    Component Value Date/Time   CHOL 193 07/06/2019 0619   TRIG 103 07/06/2019 0619   HDL 33 (L) 07/06/2019 0619   CHOLHDL 5.8 07/06/2019 0619   VLDL 21 07/06/2019 0619   LDLCALC 139 (H) 07/06/2019 0619    HgbA1C:  Lab Results  Component Value Date   HGBA1C 6.2 (H) 07/06/2019    Urine Drug Screen:  No results found for: LABOPIA, COCAINSCRNUR, LABBENZ, AMPHETMU, THCU, LABBARB  Alcohol Level: No results for input(s): ETH in the last 168 hours.  Other results: EKG: normal sinus rhythm at 74 bpm.  Imaging: CT HEAD WO CONTRAST  Result Date: 07/05/2019 CLINICAL DATA:  Dizziness EXAM: CT HEAD WITHOUT CONTRAST TECHNIQUE: Contiguous axial images were obtained from the base of the skull through the vertex without intravenous contrast. COMPARISON:  None. FINDINGS: Brain: There is no acute intracranial hemorrhage, mass effect, or edema. Gray-white differentiation is preserved. There is no extra-axial fluid collection. Ventricles and sulci are within normal limits in size and configuration. Minimal patchy hypoattenuation in the supratentorial white matter  is nonspecific but may reflect minor chronic microvascular ischemic changes. Vascular: No hyperdense vessel or unexpected calcification. Skull: Calvarium is unremarkable. Sinuses/Orbits: No acute finding. Other: None. IMPRESSION: No acute intracranial hemorrhage, mass effect, or evidence of acute infarction. Electronically Signed   By: Macy Mis M.D.   On: 07/05/2019 08:20   MR BRAIN WO CONTRAST  Result Date: 07/05/2019 CLINICAL DATA:  Acute presentation with left-sided numbness and tingling affecting the leg. EXAM: MRI HEAD WITHOUT CONTRAST TECHNIQUE: Multiplanar, multiecho pulse sequences of the brain and surrounding structures were obtained without intravenous contrast. COMPARISON:  Head CT same day  FINDINGS: Brain: Diffusion imaging shows a small area of acute infarction affecting the lateral thalamus on the right. No sign of swelling or hemorrhage. No other acute finding. Elsewhere, there are mild chronic small-vessel changes of the hemispheric white matter. No large vessel territory infarction. No mass lesion, hemorrhage, hydrocephalus or extra-axial collection. Vascular: Major vessels at the base of the brain show flow. Skull and upper cervical spine: Negative Sinuses/Orbits: Clear/normal Other: None IMPRESSION: Small area of acute infarction in the lateral right thalamus. Mild chronic small-vessel change of the white matter otherwise. No hemorrhage or mass effect. Electronically Signed   By: Nelson Chimes M.D.   On: 07/05/2019 13:39   US Carotid Bilateral (at [facility] and [facility] only)  Result Date: 07/05/2019 CLINICAL DATA:  Carotid artery disease. Acute infarction involving the right lateral thalamus. EXAM: BILATERAL CAROTID DUPLEX ULTRASOUND TECHNIQUE: Pearline Cables scale imaging, color Doppler and duplex ultrasound were performed of bilateral carotid and vertebral arteries in the neck. COMPARISON:  None. FINDINGS: Criteria: Quantification of carotid stenosis is based on velocity parameters that correlate the residual internal carotid diameter with NASCET-based stenosis levels, using the diameter of the distal internal carotid lumen as the denominator for stenosis measurement. The following velocity measurements were obtained: RIGHT ICA: 247/58 cm/sec CCA: [id number] cm/sec SYSTOLIC ICA/CCA RATIO:  3.5 ECA: 150 cm/sec LEFT ICA: 100/33 cm/sec CCA: [id number] cm/sec SYSTOLIC ICA/CCA RATIO:  1.6 ECA: 85 cm/sec RIGHT CAROTID ARTERY: Echogenic and heterogeneous plaque at the right carotid bulb. External carotid artery is patent with normal waveform. Irregular heterogeneous plaque at the proximal aspect of the internal carotid artery. Evidence for stenosis and turbulent flow in the proximal internal carotid artery. Elevated peak  systolic velocity in the proximal internal carotid artery measures 247 cm/sec and end-diastolic velocity measures 58 cm/sec. Mid and distal right internal carotid artery are patent. RIGHT VERTEBRAL ARTERY: Antegrade flow and normal waveform in the right vertebral artery. LEFT CAROTID ARTERY: Mild atherosclerotic disease at the left carotid bulb. Minimal plaque near the proximal external carotid artery. External carotid artery is patent with normal waveform. Small amount of plaque in the proximal internal carotid artery. Normal waveforms and velocities in the internal carotid artery. LEFT VERTEBRAL ARTERY: Antegrade flow and normal waveform in the left vertebral artery. IMPRESSION: 1. Atherosclerotic plaque involving the right carotid bulb and proximal right internal carotid artery. Estimated degree of stenosis in the right internal carotid artery is greater than 70%. 2. Mild plaque in the left carotid arteries. Estimated degree of stenosis in left internal carotid artery is less than 50%. 3. Patent vertebral arteries with antegrade flow. Electronically Signed   By: Markus Daft M.D.   On: 07/05/2019 15:34    Assessment: 74 y.o. male with medical history significant for dyslipidemia and hypertension who presents to the ER for evaluation of left-sided numbness and weakness.  Patient outside time window for tPA.  On no antiplatelet therapy  prior to admission.  BP controlled.  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   - MRI with with R thalamic stroke. Small vessel disease on imaging  - R ICA stenosis  Over 70 % but don't think contributed to current symptoms  given that stroke is in posterior circulation  - agree dual anti platelet therapy - vascular evaluation.

## 2019-07-06 NOTE — TOC Initial Note (Signed)
Transition of Care [facility]) - Initial/Assessment Note    Patient Details  Name: Russell Herman MRN: [id number] Date of Birth: [birth year]  [facility] CM/SW Contact:    Shelbie Hutching, RN Phone Number: 07/06/2019, 12:33 PM  Clinical Narrative:                 RNCM introduced self to patient and patient's wife and explained role.  Patient placed under observation for stroke.  Patient is completely independent at baseline, he reports he gardens and chops wood and is very active at home.  Patient is current with PCP.  PT has recommended CIR, patient and wife really hope that he is approved.  RNCM will cont to follow.  If patient goes home he will need a walker and home health services arranged.   Expected Discharge Plan: IP Rehab Facility Barriers to Discharge: Other (comment)(CIR reviewing patient for admission)   Patient Goals and CMS Choice Patient states their goals for this hospitalization and ongoing recovery are:: Patient and wife really want CIR CMS Medicare.gov Compare Post Acute Care list provided to:: Patient Choice offered to / list presented to : Patient  Expected Discharge Plan and Services Expected Discharge Plan: [place]   Discharge Planning Services: CM Consult Post Acute Care Choice: IP Rehab Living arrangements for the past 2 months: Single Family Home                                      Prior Living Arrangements/Services Living arrangements for the past 2 months: Single Family Home Lives with:: Spouse Patient language and need for interpreter reviewed:: Yes Do you feel safe going back to the place where you live?: Yes      Need for Family Participation in Patient Care: Yes (Comment)(stroke) Care giver support system in place?: Yes (comment)(wife and son)   Criminal Activity/Legal Involvement Pertinent to Current Situation/Hospitalization: No - Comment as needed  Activities of Daily Living Home Assistive Devices/Equipment:  Eyeglasses, Dentures (specify type), Hearing aid ADL Screening (condition at time of admission) Patient's cognitive ability adequate to safely complete daily activities?: Yes Is the patient deaf or have difficulty hearing?: No Does the patient have difficulty seeing, even when wearing glasses/contacts?: No Does the patient have difficulty concentrating, remembering, or making decisions?: No Patient able to express need for assistance with ADLs?: Yes Does the patient have difficulty dressing or bathing?: No Independently performs ADLs?: Yes (appropriate for developmental age) Does the patient have difficulty walking or climbing stairs?: Yes Weakness of Legs: Left Weakness of Arms/Hands: Left  Permission Sought/Granted Permission sought to share information with : Case Manager, Customer service manager, Family Supports Permission granted to share information with : Yes, Verbal Permission Granted  Share Information with NAME: Katharine Look  Permission granted to share info w AGENCY: CIR  Permission granted to share info w Relationship: wife     Emotional Assessment Appearance:: Appears stated age Attitude/Demeanor/Rapport: Engaged Affect (typically observed): Accepting Orientation: : Oriented to Self, Oriented to Place, Oriented to  Time, Oriented to Situation Alcohol / Substance Use: Not Applicable Psych Involvement: No (comment)  Admission diagnosis:  TIA (transient ischemic attack) [G45.9] Stroke (cerebrum) (HCC) [I63.9] Carotid artery disease ([place]) [I77.9] Cerebrovascular accident (CVA), unspecified mechanism ([place]) [I63.9] Patient Active Problem List   Diagnosis Date Noted  . TIA (transient ischemic attack) 07/05/2019  . Essential hypertension 07/05/2019  . Hypokalemia 07/05/2019  .  Adenomatous colon polyp 01/31/2014  . Basal cell carcinoma 09/23/2013  . Hay fever 09/23/2013  . Vitamin D deficiency 09/23/2013  . Gastric catarrh 11/28/2011  . Hypercholesteremia 11/28/2011   . Laboratory examination 11/28/2011   PCP:  Valera Castle, MD Pharmacy:   [id number] - [place], [place] [address] Phone: [phone] Fax: [place], [address] [place] [address] Phone: [phone] Fax: [phone]     Social Determinants of Health (SDOH) Interventions    Readmission Risk Interventions No flowsheet data found.

## 2019-07-06 NOTE — Discharge Instructions (Signed)
1.  Follow-up with PCP in 1 week. 2.  Follow-up with neurology in 2 to 3 weeks. 3.  Continue home OT and PT. 4.  Call vascular surgeon Dr. Lucky Cowboy to schedule outpatient surgery for carotid stenosis.

## 2019-07-06 NOTE — Progress Notes (Signed)
Physical Therapy Treatment Patient Details Name: Russell Herman MRN: [id number] DOB: 1945/10/25 Today's Date: 07/06/2019    History of Present Illness 74 y.o. male with medical history significant for dyslipidemia and hypertension who presents to the ER for evaluation of left-sided numbness and tingling in his left leg with inability to bear weight on his left lower extremity.  MRI reveals acute infarct.    PT Comments    Pt was long sitting in bed upon arriving. Supportive son/spouse at bedside. Pt very motivated to participate in PT session. He was able to exit R side of bed without physical assistance however required increased time and vcs for improved technique. Sat EOB x several minutes with minimal c/o dizziness. BP 127/77. Pt was able to STS from EOB to RW with CGA. Weakness noted upon standing with minimal knee buckling present. Pt was cued throughout for technique and sequencing improvements. Pt also performed STS without AD + 1 UE HHA but required min/mod assist to stand for safety. Pt present with balance deficits, new since admission. Prior to hospitalization, pt completely independent without use of AD. Now is unsafe to attempt ambulation without assistance. Pt did tolerate ambulation with RW 160 ft with CGA + one occasion of Min assist to prevent fall 2/2 to scissoring episode. Throughout gait training with use of RW, pt has slight knee buckling present. Vcs for safety. With ambulation 10 ft without use of AD, but with +1 UE support (HHA), pt requires min-mod assist. Lengthy discussion with pt/pt's family about safety concerns with balance, strength and safe functional mobility deficits. Therapist feels pt would benefit from CIR to address these deficits and assist pt to returning to PLOF. Pt/Family agree that pt's deficits are severe enough that he would benefit from rehab at [facility]. Pt was seated in recliner at conclusion of session with call bell in reach and family at bedside. CM inform of  update in CIR recommendation. OT to see pt later this date.    Follow Up Recommendations  CIR     Equipment Recommendations  Rolling walker with 5" wheels    Recommendations for Other Services       Precautions / Restrictions Precautions Precautions: Fall Restrictions Weight Bearing Restrictions: No    Mobility  Bed Mobility Overal bed mobility: Modified Independent(HOB elevated, increased time)             General bed mobility comments: Pt was able to exit R side of bed with HOB elevated and increased time required. He does have some slight ataxic movements. upon sitting EOB, CGA for a few sec until pt able to achieve safe sitting balance  Transfers Overall transfer level: Needs assistance Equipment used: Rolling walker (2 wheeled) Transfers: Sit to/from Stand Sit to Stand: Min guard         General transfer comment: Pt required CGA + vcs for technique to STS from EOB to RW. slight knee buckling present. pt did stand without AD one time with +1 UE HHA but required min assist to safely perform.  Ambulation/Gait Ambulation/Gait assistance: Min guard;Mod assist Gait Distance (Feet): 160 Feet Assistive device: Rolling walker (2 wheeled) Gait Pattern/deviations: Decreased step length - right;Narrow base of support;Trunk flexed Gait velocity: decreased   General Gait Details: Pt ambulated 1 x 160 ft with RW + CGA. He does have slight knee buckling throughout with occasional min assist required for safety. He ambulated 1 x 10 ft without AD with Mod assist with +1 UE HHA. Pt very unsteady wiihthout  BUE support.   Stairs             Wheelchair Mobility    Modified Rankin (Stroke Patients Only)       Balance Overall balance assessment: Needs assistance Sitting-balance support: Bilateral upper extremity supported;Feet supported Sitting balance-Leahy Scale: Fair     Standing balance support: Bilateral upper extremity supported Standing balance-Leahy Scale:  Fair Standing balance comment: poor with single UE support but fair with BUE support                            Cognition Arousal/Alertness: Awake/alert Behavior During Therapy: WFL for tasks assessed/performed Overall Cognitive Status: Within Functional Limits for tasks assessed                                 General Comments: Pt is A and O x 4 and motivated to participate in PT      Exercises      General Comments        Pertinent Vitals/Pain Pain Assessment: No/denies pain    Home Living                      Prior Function            PT Goals (current goals can now be found in the care plan section) Acute Rehab PT Goals Patient Stated Goal: return to PLOF Progress towards PT goals: Progressing toward goals    Frequency    7X/week      PT Plan Current plan remains appropriate    Co-evaluation              AM-PAC PT "6 Clicks" Mobility   Outcome Measure  Help needed turning from your back to your side while in a flat bed without using bedrails?: None Help needed moving from lying on your back to sitting on the side of a flat bed without using bedrails?: A Little Help needed moving to and from a bed to a chair (including a wheelchair)?: A Little Help needed standing up from a chair using your arms (e.g., wheelchair or bedside chair)?: A Little Help needed to walk in hospital room?: A Little Help needed climbing 3-5 steps with a railing? : A Lot 6 Click Score: 18    End of Session Equipment Utilized During Treatment: Gait belt Activity Tolerance: Patient tolerated treatment well;Patient limited by fatigue Patient left: in chair;with call bell/phone within reach;with chair alarm set;with family/visitor present Nurse Communication: Mobility status PT Visit Diagnosis: Muscle weakness (generalized) (M62.81);Other symptoms and signs involving the nervous system (R29.898);Hemiplegia and hemiparesis;Other abnormalities of  gait and mobility (R26.89) Hemiplegia - Right/Left: Left Hemiplegia - dominant/non-dominant: Non-dominant Hemiplegia - caused by: Cerebral infarction     Time: 0927-1004 PT Time Calculation (min) (ACUTE ONLY): 37 min  Charges:  $Gait Training: 8-22 mins $Therapeutic Activity: 8-22 mins $Neuromuscular Re-education: 8-22 mins                     Julaine Fusi PTA 07/06/19, 10:39 AM

## 2019-07-06 NOTE — Progress Notes (Signed)
SLP Cancellation Note  Patient Details Name: Russell Herman MRN: [id number] DOB: [birth year]   Cancelled treatment:       Reason Eval/Treat Not Completed: SLP screened, no needs identified, will sign off(chart reviewed; consulted pt/son, then NSG). Pt denied any difficulty swallowing and is currently on a regular diet; tolerates swallowing pills w/ water per NSG. Pt eating his breakfast during this screening. Pt conversed at conversational level w/out deficits noted; pt and Son denied any speech-language deficits.  No further skilled ST services indicated as pt appears at his baseline. Pt agreed. NSG to reconsult if any change in status while admitted.     Orinda Kenner, MS, CCC-SLP Ercie Eliasen 07/06/2019, 9:13 AM

## 2019-07-06 NOTE — Discharge Summary (Signed)
Physician Discharge Summary  [name] MRN: [id number] DOB/AGE: 1945/03/08 74 y.o.  Admit date: 07/05/2019 Discharge date: 07/06/2019  Admission Diagnoses:  Discharge Diagnoses:  Principal Problem:   TIA (transient ischemic attack) Active Problems:   Hypercholesteremia   Essential hypertension   Hypokalemia   Discharged Condition: good  Hospital Course: Russell Herman is a 75 y.o. male with medical history significant for dyslipidemia and hypertension who presents to the ER for evaluation of left-sided numbness and tingling in his left leg with inability to bear weight on his left lower extremity.  Patient's last known well was last night when he went to bed.  He woke up with the symptoms.  He denies having any speech or visual problems and has no difficulty swallowing.  He denies having any headaches. His CT head did not have any acute changes.  He was seen by neurology.  MRI of the brain which showed infarct in the lateral right thalamus.  Seen by speech therapy, Occupational Therapy as well as physical therapy.  He did well with therapies.  He did not qualify for inpatient rehab.  At this point, his condition had improved.  I will provide the patient with a walker, also sent home PT, OT and RN.  Patient will follow with family doctor, neurology, and vascular surgeon as outpatient.  1.  Right lateral thalamus acute stroke. Continue aspirin, Plavix.  PPI was switched to Protonix to avoid interaction with Plavix.  Continue home physical therapy and Occupational Therapy.  2.  Right internal carotid artery stenosis.  Patient had ultrasound, showed more than 70% stenosis in the internal carotid artery.  CT angiogram of the neck confirmed 78% stenosis.  Patient has been seen by vascular surgeon.  Surgery will be scheduled as outpatient.  3.  Essential hypertension. Resume blood pressure medicines.  4.  Hypokalemia. Supplemented   Consults: neurology  Significant  Diagnostic Studies:  CT HEAD WITHOUT CONTRAST  TECHNIQUE: Contiguous axial images were obtained from the base of the skull through the vertex without intravenous contrast.  COMPARISON:  None.  FINDINGS: Brain: There is no acute intracranial hemorrhage, mass effect, or edema. Gray-white differentiation is preserved. There is no extra-axial fluid collection. Ventricles and sulci are within normal limits in size and configuration. Minimal patchy hypoattenuation in the supratentorial white matter is nonspecific but may reflect minor chronic microvascular ischemic changes.  Vascular: No hyperdense vessel or unexpected calcification.  Skull: Calvarium is unremarkable.  Sinuses/Orbits: No acute finding.  Other: None.  IMPRESSION: No acute intracranial hemorrhage, mass effect, or evidence of acute infarction.   Electronically Signed   By: Macy Mis M.D.   On: 07/05/2019 08:20  BILATERAL CAROTID DUPLEX ULTRASOUND  TECHNIQUE: Pearline Cables scale imaging, color Doppler and duplex ultrasound were performed of bilateral carotid and vertebral arteries in the neck.  COMPARISON:  None.  FINDINGS: Criteria: Quantification of carotid stenosis is based on velocity parameters that correlate the residual internal carotid diameter with NASCET-based stenosis levels, using the diameter of the distal internal carotid lumen as the denominator for stenosis measurement.  The following velocity measurements were obtained:  RIGHT  ICA: 247/58 cm/sec  CCA: XX123456 cm/sec  SYSTOLIC ICA/CCA RATIO:  3.5  ECA: 150 cm/sec  LEFT  ICA: 100/33 cm/sec  CCA: XX123456 cm/sec  SYSTOLIC ICA/CCA RATIO:  1.6  ECA: 85 cm/sec  RIGHT CAROTID ARTERY: Echogenic and heterogeneous plaque at the right carotid bulb. External carotid artery is patent with normal waveform. Irregular heterogeneous plaque at the  proximal aspect of the internal carotid artery. Evidence for stenosis and  turbulent flow in the proximal internal carotid artery. Elevated peak systolic velocity in the proximal internal carotid artery measures 247 cm/sec and end-diastolic velocity measures 58 cm/sec. Mid and distal right internal carotid artery are patent.  RIGHT VERTEBRAL ARTERY: Antegrade flow and normal waveform in the right vertebral artery.  LEFT CAROTID ARTERY: Mild atherosclerotic disease at the left carotid bulb. Minimal plaque near the proximal external carotid artery. External carotid artery is patent with normal waveform. Small amount of plaque in the proximal internal carotid artery. Normal waveforms and velocities in the internal carotid artery.  LEFT VERTEBRAL ARTERY: Antegrade flow and normal waveform in the left vertebral artery.  IMPRESSION: 1. Atherosclerotic plaque involving the right carotid bulb and proximal right internal carotid artery. Estimated degree of stenosis in the right internal carotid artery is greater than 70%. 2. Mild plaque in the left carotid arteries. Estimated degree of stenosis in left internal carotid artery is less than 50%. 3. Patent vertebral arteries with antegrade flow.   Electronically Signed   By: Markus Daft M.D.   On: 07/05/2019   MRI HEAD WITHOUT CONTRAST  TECHNIQUE: Multiplanar, multiecho pulse sequences of the brain and surrounding structures were obtained without intravenous contrast.  COMPARISON:  Head CT same day  FINDINGS: Brain: Diffusion imaging shows a small area of acute infarction affecting the lateral thalamus on the right. No sign of swelling or hemorrhage. No other acute finding. Elsewhere, there are mild chronic small-vessel changes of the hemispheric white matter. No large vessel territory infarction. No mass lesion, hemorrhage, hydrocephalus or extra-axial collection.  Vascular: Major vessels at the base of the brain show flow.  Skull and upper cervical spine: Negative  Sinuses/Orbits:  Clear/normal  Other: None  IMPRESSION: Small area of acute infarction in the lateral right thalamus. Mild chronic small-vessel change of the white matter otherwise. No hemorrhage or mass effect.   Electronically Signed   By: Nelson Chimes M.D.   On: 07/05/2019 13:39  CT ANGIOGRAPHY NECK  TECHNIQUE: Multidetector CT imaging of the neck was performed using the standard protocol during bolus administration of intravenous contrast. Multiplanar CT image reconstructions and MIPs were obtained to evaluate the vascular anatomy. Carotid stenosis measurements (when applicable) are obtained utilizing NASCET criteria, using the distal internal carotid diameter as the denominator.  CONTRAST:  35mL OMNIPAQUE IOHEXOL 350 MG/ML SOLN  COMPARISON:  Carotid Doppler 07/05/2019  FINDINGS: Aortic arch: Standard branching. Imaged portion shows no evidence of aneurysm or dissection. No significant stenosis of the major arch vessel origins. Mild atherosclerotic calcification aortic arch.  Right carotid system: Calcified and noncalcified plaque right carotid bifurcation. Minimal luminal diameter on the sagittal view is 0.86 mm corresponding to 78% diameter stenosis. No intraluminal filling defect. Remainder of the right internal carotid artery is patent.  Left carotid system: Mild atherosclerotic disease left carotid bifurcation without stenosis.  Vertebral arteries: Both vertebral arteries patent to the basilar. Mild stenosis proximal right vertebral artery. No significant left vertebral stenosis.  Skeleton: Cervical spondylosis diffusely. No focal or acute skeletal abnormality.  Other neck: Negative for mass or adenopathy in the neck.  Upper chest: Lung apices clear bilaterally.  IMPRESSION: 1. 78% diameter stenosis proximal right internal carotid artery due to calcified and noncalcified atherosclerotic plaque. 2. No significant left carotid stenosis 3. Mild stenosis  origin of right vertebral artery. Left vertebral artery widely patent.   Electronically Signed   By: Franchot Gallo M.D.  On: 07/06/2019 11:36      Treatments: Aspirin and Plavix.  Discharge Exam: Blood pressure 126/73, pulse 73, temperature 98.1 F (36.7 C), temperature source Oral, resp. rate 17, height 5\' 6"  (1.676 m), weight 72 kg, SpO2 98 %. General appearance: alert, cooperative and no distress Resp: clear to auscultation bilaterally Cardio: regular rate and rhythm, S1, S2 normal, no murmur, click, rub or gallop GI: soft, non-tender; bowel sounds normal; no masses,  no organomegaly Skin: Skin color, texture, turgor normal. No rashes or lesions Neurologic: Mental status: Alert, oriented, thought content appropriate Cranial nerves: normal Gait: Unsteady  Disposition: Discharge disposition: 01-Home or Self Care       Discharge Instructions    Diet - low sodium heart healthy   Complete by: As directed    Increase activity slowly   Complete by: As directed      Allergies as of 07/06/2019      Reactions   Losartan Other (See Comments)   Chlorthalidone Nausea And Vomiting   Can tolerate if taking with Omeprazole   Keflex [cephalexin]    Statins Rash   Myalgias, muscle pain/weakness   Sulfa Antibiotics Rash   Sulfamethoxazole-trimethoprim Rash      Medication List    STOP taking these medications   omeprazole 20 MG capsule Commonly known as: PRILOSEC Replaced by: pantoprazole 40 MG tablet     TAKE these medications   aspirin 81 MG EC tablet Take 1 tablet (81 mg total) by mouth daily. Start taking on: July 07, 2019   chlorthalidone 25 MG tablet Commonly known as: HYGROTON Take 25 mg by mouth daily.   clopidogrel 75 MG tablet Commonly known as: PLAVIX Take 1 tablet (75 mg total) by mouth daily. Start taking on: July 07, 2019   fluticasone 50 MCG/ACT nasal spray Commonly known as: FLONASE Place 2 sprays into both nostrils daily.   montelukast  10 MG tablet Commonly known as: SINGULAIR Take 10 mg by mouth at bedtime.   pantoprazole 40 MG tablet Commonly known as: PROTONIX Take 1 tablet (40 mg total) by mouth daily. Start taking on: July 07, 2019 Replaces: omeprazole 20 MG capsule   rosuvastatin 40 MG tablet Commonly known as: CRESTOR Take 1 tablet (40 mg total) by mouth at bedtime.   Vitamin D-3 25 MCG (1000 UT) Caps Take 1,000 Units by mouth daily at 12 noon.            Durable Medical Equipment  (From admission, onward)         Start     Ordered   07/06/19 1657  For home use only DME Walker  Once    Question:  Patient needs a walker to treat with the following condition  Answer:  Acute arterial ischemic stroke, multifocal, posterior circulation ([place])   07/06/19 1657         Follow-up Information    Dew, Erskine Squibb, MD. Call.   Specialties: Vascular Surgery, Radiology, Interventional Cardiology Why: Devona Konig to discuss your upcoming surgery Contact information: [address] [place] [address] [phone]        Valera Castle, MD Follow up in 1 week(s).   Specialty: Family Medicine Contact information: [place] [address] [phone]        [facility] Follow up in 2 week(s).   Contact information: [place] [phone]          Signed: Sharen Hones 07/06/2019, 5:05  PM

## 2019-07-06 NOTE — Evaluation (Signed)
Occupational Therapy Evaluation Patient Details Name: Russell Herman MRN: [id number] DOB: 1945/11/22 Today's Date: 07/06/2019    History of Present Illness 74 y.o. male with medical history significant for dyslipidemia and hypertension who presents to the ER for evaluation of left-sided numbness and tingling in his left leg with inability to bear weight on his left lower extremity.  MRI reveals acute infarct.   Clinical Impression   Russell Herman was seen for OT evaluation this date. Russell Herman received with family members at bedside. Prior to hospital admission, Russell Herman was active and independent in all aspects of ADL/IADL management. Russell Herman endorses working in his yard, and taking frequent walks prior to admission without AD. Russell Herman lives with his spouse in a multi level home with bed and bathroom on the main level. Currently Russell Herman demonstrates impairments in LUE/LLE strength, coordination, and sensation. Russell Herman is R hand dominant and requires mod assist for assist for STS ADL tasks and MOD assist for transfers due to decreased proprioception and AROM of his LLE and LUE. Russell Herman is eager to return to his PLOF with increased independence and functional use of his left arm and leg. Russell Herman educated on safe positioning of LUE in order to promote functional return, improve safety, and promote skin integrity on this date. Russell Herman was motivated t/o OT session and return demonstrated PROM exercises with VCs for technique. No cognitive or visual deficits noted with assessment on this date. Russell Herman would benefit from skilled OT to address noted impairments and functional limitations (see below for any additional details) in order to maximize safety and independence while minimizing falls risk and caregiver burden.  Upon hospital discharge, recommend Russell Herman discharge to CIR to maximize safety and return to PLOF.     Follow Up Recommendations  CIR    Equipment Recommendations  3 in 1 bedside commode    Recommendations for Other Services       Precautions /  Restrictions Precautions Precautions: Fall Precaution Comments: Moderate Fall Restrictions Weight Bearing Restrictions: No      Mobility Bed Mobility Overal bed mobility: Modified Independent(HOB elevated, increased time)             General bed mobility comments: Deferred. Russell Herman in recliner at start/end of OT session.  Transfers Overall transfer level: Needs assistance Equipment used: Rolling walker (2 wheeled) Transfers: Sit to/from Stand Sit to Stand: Mod assist;Min guard         General transfer comment: Russell Herman requires variable level of assist with and without AE for mobility. With RW requires min guard with mod cueing for hand/foot placement for STS as well as close Min guard during functional mobility. 2x knee buckling of L noted during mobililty Russell Herman maintains standing alance with Min assist and heavy UE use on RW. Without AD, Russell Herman requires MOD A HHA to safely come to standing from recliner. Russell Herman requires MOD A given HHA on L side to perform SPT to hospital transport chair at end of session.    Balance Overall balance assessment: Needs assistance Sitting-balance support: Bilateral upper extremity supported;Feet supported Sitting balance-Leahy Scale: Fair     Standing balance support: Bilateral upper extremity supported Standing balance-Leahy Scale: Fair Standing balance comment: poor with single UE support but fair with BUE support                           ADL either performed or assessed with clinical judgement   ADL Overall ADL's : Needs assistance/impaired  General ADL Comments: Russell Herman functionally limited by decreased sensation, proprioception, and strength in his LUE/LLE. He dons/doffs LB clothing given MOD A to maintain standing balance and to bring pants over buttocks. With RW Russell Herman is able to stand at sink to perform grooming tasks including oral care with close CGA for safety. With walker removed and no UE  supported, Russell Herman requires moderate assist to maintain standing balance and increased physical assist for set-up of materials placed outside BOS. Russell Herman also presents with significantly decreased [place] in his LUE which functionally limits his ability to perform two-handed ADL tasks such as buttoning shirts/pants, or applying toothpaste to toothbrush. He requi     Vision Baseline Vision/History: Wears glasses Wears Glasses: Reading only Patient Visual Report: No change from baseline       Perception     Praxis      Pertinent Vitals/Pain Pain Assessment: No/denies pain     Hand Dominance Right   Extremity/Trunk Assessment Upper Extremity Assessment Upper Extremity Assessment: LUE deficits/detail(RUE WNL) LUE Deficits / Details: RUE grossly 3+/5. Decreased [place] and [place] noted as compared to R. Russell Herman with poor grading of movement, tends to significantly overshoot/undershoot target when reaching with LUE. Russell Herman reports decreased sensation and proprioception t/o. States he can't really tell where his LUE is or control where it goes. LUE Sensation: decreased proprioception;decreased light touch LUE Coordination: decreased gross motor;decreased fine motor   Lower Extremity Assessment Lower Extremity Assessment: Defer to Russell Herman evaluation;LLE deficits/detail LLE Deficits / Details: significant decreased sensation/proprioception deficits noted, L knee buckling intermittently with mobility. LLE Sensation: decreased light touch;decreased proprioception LLE Coordination: decreased gross motor;decreased fine motor       Communication Communication Communication: No difficulties   Cognition Arousal/Alertness: Awake/alert Behavior During Therapy: WFL for tasks assessed/performed Overall Cognitive Status: Within Functional Limits for tasks assessed                                 General Comments: Russell Herman is A and O x 4 and motivated to participate in OT session   General Comments  Russell Herman c/o dizziness  during session. BP monitored noted to be 129/63. Dizziness does not increase during session or with positional changes. Russell Herman reports 2/10 dizziness at end of session.    Exercises Other Exercises Other Exercises: Russell Herman and caregivers (spouse and son at bedside) educated on role of OT in acute setting, falls prevention strategies, safe use of AE/DME for ADL management, safe transfer techniques, and compensatory dressing strategies for mgt of L sided weakness Other Exercises: OT facilitates LB dressing, functional mobility, standing grooming tasks. See ADL section for additional details regarding occupational performance.   Shoulder Instructions      Home Living Family/patient expects to be discharged to:: Private residence Living Arrangements: Spouse/significant other Available Help at Discharge: Available 24 hours/day;Family Type of Home: [place]: Able to live on main level with bedroom/bathroom     Bathroom Shower/Tub: Tub/shower unit;Curtain   Bathroom Toilet: Standard(Comfort height) Bathroom Accessibility: Yes How Accessible: Accessible via walker Home Equipment: [place] held shower head          Prior Functioning/Environment Level of Independence: Independent        Comments: Russell Herman was active and independent PTA reports using a wood splitter last week, walks 1 mile 3x/ week. Does not use AE for ADL/Functional mobility.        OT Problem List:  Decreased strength;Decreased coordination;Impaired sensation;Decreased activity tolerance;Decreased safety awareness;Impaired balance (sitting and/or standing);Decreased knowledge of use of DME or AE;Impaired UE functional use      OT Treatment/Interventions: Self-care/ADL training;Therapeutic exercise;Therapeutic activities;Energy conservation;Patient/family education;DME and/or AE instruction;Balance training;Neuromuscular education    OT Goals(Current goals can be found in the care plan section) Acute Rehab  OT Goals Patient Stated Goal: return to PLOF OT Goal Formulation: With patient Time For Goal Achievement: 07/20/19 Potential to Achieve Goals: Good ADL Goals Russell Herman Will Perform Grooming: with modified independence;sitting(LRAD PRN for safety) Russell Herman Will Perform Upper Body Dressing: sitting;with modified independence Russell Herman Will Perform Lower Body Dressing: with modified independence;sit to/from stand;with adaptive equipment(LRAD PRN for safety) Russell Herman Will Transfer to Toilet: with modified independence;ambulating;bedside commode;regular height toilet(LRAD PRN for safety) Russell Herman Will Perform Toileting - Clothing Manipulation and hygiene: with modified independence;sit to/from stand;with adaptive equipment(LRAD PRN for safety)  OT Frequency: Min 3X/week   Barriers to D/C:            Co-evaluation              AM-PAC OT "6 Clicks" Daily Activity     Outcome Measure Help from another person eating meals?: A Little Help from another person taking care of personal grooming?: A Little Help from another person toileting, which includes using toliet, bedpan, or urinal?: A Lot Help from another person bathing (including washing, rinsing, drying)?: A Lot Help from another person to put on and taking off regular upper body clothing?: A Little Help from another person to put on and taking off regular lower body clothing?: A Lot 6 Click Score: 15   End of Session Equipment Utilized During Treatment: Gait belt;Rolling walker  Activity Tolerance: Patient tolerated treatment well Patient left: Other (comment)(Russell Herman taken by hospital transport to CT at end of session.)  OT Visit Diagnosis: Other abnormalities of gait and mobility (R26.89);Hemiplegia and hemiparesis Hemiplegia - Right/Left: Left Hemiplegia - dominant/non-dominant: Non-Dominant Hemiplegia - caused by: Cerebral infarction                Time: [id number] OT Time Calculation (min): 41 min Charges:  OT General Charges $OT Visit: 1 Visit OT  Evaluation $OT Eval Moderate Complexity: 1 Mod OT Treatments $Self Care/Home Management : 23-37 mins  Shara Blazing, M.S., OTR/L Ascom: [phone] 07/06/19, 12:06 PM

## 2019-07-07 ENCOUNTER — Encounter: Payer: Self-pay | Admitting: Internal Medicine

## 2019-07-07 ENCOUNTER — Other Ambulatory Visit: Payer: Self-pay

## 2019-07-07 ENCOUNTER — Encounter (HOSPITAL_COMMUNITY): Payer: Self-pay | Admitting: Physical Medicine & Rehabilitation

## 2019-07-07 ENCOUNTER — Inpatient Hospital Stay (HOSPITAL_COMMUNITY)
Admission: AD | Admit: 2019-07-07 | Discharge: 2019-07-15 | DRG: 057 | Disposition: A | Discharge status: Home Health | Payer: Medicare Other | Source: Other Acute Inpatient Hospital | Attending: Physical Medicine & Rehabilitation | Admitting: Physical Medicine & Rehabilitation

## 2019-07-07 ENCOUNTER — Telehealth (INDEPENDENT_AMBULATORY_CARE_PROVIDER_SITE_OTHER): Payer: Self-pay

## 2019-07-07 DIAGNOSIS — Z79899 Other long term (current) drug therapy: Secondary | ICD-10-CM

## 2019-07-07 DIAGNOSIS — I69354 Hemiplegia and hemiparesis following cerebral infarction affecting left non-dominant side: Principal | ICD-10-CM

## 2019-07-07 DIAGNOSIS — K2100 Gastro-esophageal reflux disease with esophagitis, without bleeding: Secondary | ICD-10-CM | POA: Diagnosis present

## 2019-07-07 DIAGNOSIS — Z881 Allergy status to other antibiotic agents status: Secondary | ICD-10-CM

## 2019-07-07 DIAGNOSIS — I639 Cerebral infarction, unspecified: Secondary | ICD-10-CM | POA: Diagnosis present

## 2019-07-07 DIAGNOSIS — Z7902 Long term (current) use of antithrombotics/antiplatelets: Secondary | ICD-10-CM

## 2019-07-07 DIAGNOSIS — J302 Other seasonal allergic rhinitis: Secondary | ICD-10-CM | POA: Diagnosis present

## 2019-07-07 DIAGNOSIS — I10 Essential (primary) hypertension: Secondary | ICD-10-CM | POA: Diagnosis present

## 2019-07-07 DIAGNOSIS — Z882 Allergy status to sulfonamides status: Secondary | ICD-10-CM | POA: Diagnosis not present

## 2019-07-07 DIAGNOSIS — R944 Abnormal results of kidney function studies: Secondary | ICD-10-CM

## 2019-07-07 DIAGNOSIS — Z7982 Long term (current) use of aspirin: Secondary | ICD-10-CM | POA: Diagnosis not present

## 2019-07-07 DIAGNOSIS — Z85828 Personal history of other malignant neoplasm of skin: Secondary | ICD-10-CM

## 2019-07-07 DIAGNOSIS — N179 Acute kidney failure, unspecified: Secondary | ICD-10-CM | POA: Diagnosis present

## 2019-07-07 DIAGNOSIS — Z87891 Personal history of nicotine dependence: Secondary | ICD-10-CM

## 2019-07-07 DIAGNOSIS — E785 Hyperlipidemia, unspecified: Secondary | ICD-10-CM | POA: Diagnosis present

## 2019-07-07 DIAGNOSIS — E876 Hypokalemia: Secondary | ICD-10-CM | POA: Diagnosis present

## 2019-07-07 DIAGNOSIS — I63331 Cerebral infarction due to thrombosis of right posterior cerebral artery: Secondary | ICD-10-CM | POA: Diagnosis not present

## 2019-07-07 DIAGNOSIS — I69359 Hemiplegia and hemiparesis following cerebral infarction affecting unspecified side: Secondary | ICD-10-CM

## 2019-07-07 DIAGNOSIS — I63532 Cerebral infarction due to unspecified occlusion or stenosis of left posterior cerebral artery: Secondary | ICD-10-CM

## 2019-07-07 DIAGNOSIS — Z974 Presence of external hearing-aid: Secondary | ICD-10-CM | POA: Diagnosis not present

## 2019-07-07 DIAGNOSIS — Z888 Allergy status to other drugs, medicaments and biological substances status: Secondary | ICD-10-CM | POA: Diagnosis not present

## 2019-07-07 DIAGNOSIS — I6309 Cerebral infarction due to thrombosis of other precerebral artery: Secondary | ICD-10-CM

## 2019-07-07 MED ORDER — ROSUVASTATIN CALCIUM 20 MG PO TABS
40.0000 mg | ORAL_TABLET | Freq: Every day | ORAL | Status: DC
  Administered 2019-07-07 – 2019-07-14 (×8): 40 mg via ORAL
  Filled 2019-07-07: qty 2
  Filled 2019-07-07: qty 8
  Filled 2019-07-07 (×7): qty 2

## 2019-07-07 MED ORDER — BISACODYL 10 MG RE SUPP: 10.0000 mg | Freq: Every day | RECTAL | Status: DC | PRN

## 2019-07-07 MED ORDER — BLOOD PRESSURE CONTROL BOOK
Freq: Once | Status: AC
  Administered 2019-07-07: 1
  Filled 2019-07-07 (×2): qty 1

## 2019-07-07 MED ORDER — CLOPIDOGREL BISULFATE 75 MG PO TABS
75.0000 mg | ORAL_TABLET | Freq: Every day | ORAL | Status: DC
  Administered 2019-07-08 – 2019-07-15 (×8): 75 mg via ORAL
  Filled 2019-07-07 (×9): qty 1

## 2019-07-07 MED ORDER — PROCHLORPERAZINE EDISYLATE 10 MG/2ML IJ SOLN: 5.0000 mg | Freq: Four times a day (QID) | INTRAMUSCULAR | Status: DC | PRN

## 2019-07-07 MED ORDER — FLUTICASONE PROPIONATE 50 MCG/ACT NA SUSP
1.0000 | Freq: Every day | NASAL | Status: DC
  Filled 2019-07-07: qty 16

## 2019-07-07 MED ORDER — GUAIFENESIN-DM 100-10 MG/5ML PO SYRP: 5.0000 mL | ORAL_SOLUTION | Freq: Four times a day (QID) | ORAL | Status: DC | PRN

## 2019-07-07 MED ORDER — PROCHLORPERAZINE MALEATE 5 MG PO TABS: 5.0000 mg | ORAL_TABLET | Freq: Four times a day (QID) | ORAL | Status: DC | PRN

## 2019-07-07 MED ORDER — FLEET ENEMA 7-19 GM/118ML RE ENEM: 1.0000 | ENEMA | Freq: Once | RECTAL | Status: DC | PRN

## 2019-07-07 MED ORDER — PROCHLORPERAZINE 25 MG RE SUPP: 12.5000 mg | Freq: Four times a day (QID) | RECTAL | Status: DC | PRN

## 2019-07-07 MED ORDER — ENOXAPARIN SODIUM 40 MG/0.4ML ~~LOC~~ SOLN
40.0000 mg | SUBCUTANEOUS | Status: DC
  Administered 2019-07-07 – 2019-07-14 (×8): 40 mg via SUBCUTANEOUS
  Filled 2019-07-07 (×8): qty 0.4

## 2019-07-07 MED ORDER — ASPIRIN EC 81 MG PO TBEC
81.0000 mg | DELAYED_RELEASE_TABLET | Freq: Every day | ORAL | Status: DC
  Administered 2019-07-08 – 2019-07-15 (×8): 81 mg via ORAL
  Filled 2019-07-07 (×9): qty 1

## 2019-07-07 MED ORDER — PANTOPRAZOLE SODIUM 40 MG PO TBEC
40.0000 mg | DELAYED_RELEASE_TABLET | Freq: Every day | ORAL | Status: DC
  Administered 2019-07-08 – 2019-07-15 (×8): 40 mg via ORAL
  Filled 2019-07-07 (×9): qty 1

## 2019-07-07 MED ORDER — DIPHENHYDRAMINE HCL 12.5 MG/5ML PO ELIX: 12.5000 mg | ORAL_SOLUTION | Freq: Four times a day (QID) | ORAL | Status: DC | PRN

## 2019-07-07 MED ORDER — TRAZODONE HCL 50 MG PO TABS: 25.0000 mg | ORAL_TABLET | Freq: Every evening | ORAL | Status: DC | PRN

## 2019-07-07 MED ORDER — ALUM & MAG HYDROXIDE-SIMETH 200-200-20 MG/5ML PO SUSP: 30.0000 mL | ORAL | Status: DC | PRN

## 2019-07-07 MED ORDER — POLYETHYLENE GLYCOL 3350 17 G PO PACK: 17.0000 g | PACK | Freq: Every day | ORAL | Status: DC | PRN

## 2019-07-07 MED ORDER — ACETAMINOPHEN 325 MG PO TABS: 325.0000 mg | ORAL_TABLET | ORAL | Status: DC | PRN

## 2019-07-07 MED ORDER — MONTELUKAST SODIUM 10 MG PO TABS
10.0000 mg | ORAL_TABLET | Freq: Every day | ORAL | Status: DC
  Administered 2019-07-08 – 2019-07-14 (×7): 10 mg via ORAL
  Filled 2019-07-07 (×8): qty 1

## 2019-07-07 NOTE — TOC Transition Note (Signed)
Transition of Care [facility]) - CM/SW Discharge Note   Patient Details  Name: Russell Herman MRN: [id number] Date of Birth: Apr 26, 1945  Transition of [facility]) CM/SW Contact:  Shelbie Hutching, RN Phone Number: 07/07/2019, 9:53 AM   Clinical Narrative:    Patient has been medically cleared for discharge.  Patient has been accepted to [facility].  [facility] will provide transport over to [facility].  Patient and family updated on plan of care.    Final next level of care: IP Rehab Facility Barriers to Discharge: Barriers Resolved   Patient Goals and CMS Choice Patient states their goals for this hospitalization and ongoing recovery are:: Patient and wife really want CIR CMS Medicare.gov Compare Post Acute Care list provided to:: Patient Choice offered to / list presented to : Patient  Discharge Placement              Patient chooses bed at: [facility]) Patient to be transferred to facility by: [facility] Name of family member notified: Wife- Katharine Look Patient and family notified of of transfer: 07/07/19  Discharge Plan and Services   Discharge Planning Services: CM Consult Post Acute Care Choice: IP Rehab                               Social Determinants of Health (SDOH) Interventions     Readmission Risk Interventions No flowsheet data found.

## 2019-07-07 NOTE — Progress Notes (Signed)
Inpatient Rehab Admissions Coordinator:   Pt status switched to inpatient.  I have a bed available and can admit pt today. Dr. Roosevelt Locks in agreement.  I will arrange transport with [facility].   Shann Medal, PT, DPT Admissions Coordinator [phone] 07/07/19  9:57 AM

## 2019-07-07 NOTE — Telephone Encounter (Signed)
Spoke with the patient's wife to let her know the patient is scheduled with Dr. Saralyn Pilar for cardiac clearance on 07/08/19 @ 10:45 am, she informed me the patient is going to [facility] inpatient rehab today. I asked if she could find out if they will take him to this appt on 07/08/19 and if not to let me know so we can get him cardiac cleared. Patient's wife stated she would give me a call.

## 2019-07-07 NOTE — Progress Notes (Signed)
PMR Admission Coordinator Pre-Admission Assessment   Patient: Russell Herman is an 74 y.o., male MRN: [id number] DOB: 1945-07-12 Height: 5\' 6"  (167.6 cm) Weight: 72 kg   Insurance Information HMO:     PPO:      PCP:      IPA:      80/20:      OTHER:  PRIMARY: Medicare A and B      Policy#: [id number]      Subscriber: pt CM Name:       Phone#:      Fax#:  Pre-Cert#: verified Civil engineer, contracting:  Benefits:  Phone #:      Name:  Eff. Date: 01/03/09 (A and B)     Deduct: $1484      Out of Pocket Max: n/a      Life Max: n/a CIR: 100%      SNF: 20 full days Outpatient: 80%     Co-Pay: 20% Home Health: 100%      Co-Pay:  DME: 80%     Co-Pay: 20% Providers: pt choice  SECONDARY: [employer]      Policy#: [id number]     Phone#: [phone]   Financial Counselor:       Phone#:    The "Data Collection Information Summary" for patients in Inpatient Rehabilitation Facilities with attached "Privacy Act [place] Records" was provided and verbally reviewed with: Patient   Emergency Contact Information         Contact Information     Name Relation Home Work Mobile    Russell Herman Spouse [phone]             Current Medical History  Patient Admitting Diagnosis: CVA   History of Present Illness: Pt is a 74 y/o male with PMH of HTN who presents to [facility] on 07/05/19 for evaluation of L sided numbness and tingling with LLE weakness. 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  Pt was evaluated by vascular surgeon who recommended CEA as an outpatient.  Therapy evaluations were completed and CIR was recommended due to functional decline.     Complete NIHSS TOTAL: 2   Patient's medical record from [facility] has been reviewed by the rehabilitation admission coordinator and physician.   Past Medical History      Past Medical History:  Diagnosis Date  .  Allergy    . Arthritis    . Cancer ([place])    . GERD (gastroesophageal reflux disease)    . Hypertension    . Reflux    . Wears hearing aid in both ears        Family History   family history is not on file.   Prior Rehab/Hospitalizations Has the patient had prior rehab or hospitalizations prior to admission? No   Has the patient had major surgery during 100 days prior to admission? No               Current Medications   Current Facility-Administered Medications:  .  acetaminophen (TYLENOL) tablet 650 mg, 650 mg, Oral, Q4H PRN **OR** acetaminophen (TYLENOL) 160 MG/5ML solution 650 mg, 650 mg, Per Tube, Q4H PRN **OR** acetaminophen (TYLENOL) suppository 650 mg, 650 mg, Rectal, Q4H PRN, Agbata, Tochukwu, MD .  aspirin EC tablet 81 mg, 81 mg, Oral, Daily, Zhang, Dekui, MD .  clopidogrel (PLAVIX) tablet 75 mg, 75  mg, Oral, Daily, Sharen Hones, MD, 75 mg at 07/06/19 1459 .  fluticasone (FLONASE) 50 MCG/ACT nasal spray 1 spray, 1 spray, Each Nare, Daily, Agbata, Tochukwu, MD .  montelukast (SINGULAIR) tablet 10 mg, 10 mg, Oral, QHS, Agbata, Tochukwu, MD .  pantoprazole (PROTONIX) EC tablet 40 mg, 40 mg, Oral, Daily, Agbata, Tochukwu, MD, 40 mg at 07/06/19 0920 .  rosuvastatin (CRESTOR) tablet 40 mg, 40 mg, Oral, QHS, Sharen Hones, MD, 40 mg at 07/06/19 2147   Patients Current Diet:     Diet Order                      Diet - low sodium heart healthy           Diet 2 gram sodium Room service appropriate? Yes; Fluid consistency: Thin  Diet effective now                   Precautions / Restrictions Precautions Precautions: Fall Precaution Comments: Moderate Fall Restrictions Weight Bearing Restrictions: No    Has the patient had 2 or more falls or a fall with injury in the past year? No   Prior Activity Level Community (5-7x/wk): independent, no DME used, driving, very active prior to admission   Prior Functional Level Self Care: Did the patient need help bathing, dressing,  using the toilet or eating? Independent   Indoor Mobility: Did the patient need assistance with walking from room to room (with or without device)? Independent   Stairs: Did the patient need assistance with internal or external stairs (with or without device)? Independent   Functional Cognition: Did the patient need help planning regular tasks such as shopping or remembering to take medications? Independent   Home Assistive Devices / Equipment Home Assistive Devices/Equipment: Eyeglasses, Dentures (specify type), Hearing aid Home Equipment: Cane - quad, Hand held shower head   Prior Device Use: Indicate devices/aids used by the patient prior to current illness, exacerbation or injury? None of the above   Current Functional Level Cognition   Overall Cognitive Status: Within Functional Limits for tasks assessed Orientation Level: Oriented X4 General Comments: Pt is A and O x 4 and motivated to participate in OT session    Extremity Assessment (includes Sensation/Coordination)   Upper Extremity Assessment: LUE deficits/detail(RUE WNL) LUE Deficits / Details: RUE grossly 3+/5. Decreased [place] and [place] noted as compared to Herman. Pt with poor grading of movement, tends to significantly overshoot/undershoot target when reaching with LUE. Pt reports decreased sensation and proprioception t/o. States he can't really tell where his LUE is or control where it goes. LUE Sensation: decreased proprioception, decreased light touch LUE Coordination: decreased gross motor, decreased fine motor  Lower Extremity Assessment: Defer to PT evaluation, LLE deficits/detail LLE Deficits / Details: significant decreased sensation/proprioception deficits noted, L knee buckling intermittently with mobility. LLE Sensation: decreased light touch, decreased proprioception LLE Coordination: decreased gross motor, decreased fine motor     ADLs   Overall ADL's : Needs assistance/impaired General ADL Comments: Pt  functionally limited by decreased sensation, proprioception, and strength in his LUE/LLE. He dons/doffs LB clothing given MOD A to maintain standing balance and to bring pants over buttocks. With RW pt is able to stand at sink to perform grooming tasks including oral care with close CGA for safety. With walker removed and no UE supported, pt requires moderate assist to maintain standing balance and increased physical assist for set-up of materials placed outside BOS. Pt also presents with significantly decreased  [place] in his LUE which functionally limits his ability to perform two-handed ADL tasks such as buttoning shirts/pants, or applying toothpaste to toothbrush. He requi     Mobility   Overal bed mobility: Modified Independent(HOB elevated, increased time) General bed mobility comments: Deferred. Pt in recliner at start/end of OT session.     Transfers   Overall transfer level: Needs assistance Equipment used: Rolling walker (2 wheeled) Transfers: Sit to/from Stand Sit to Stand: Mod assist, Min guard General transfer comment: Pt requires variable level of assist with and without AE for mobility. With RW requires min guard with mod cueing for hand/foot placement for STS as well as close Min guard during functional mobility. 2x knee buckling of L noted during mobililty pt maintains standing alance with Min assist and heavy UE use on RW. Without AD, pt requires MOD A HHA to safely come to standing from recliner. Pt requires MOD A given HHA on L side to perform SPT to hospital transport chair at end of session.     Ambulation / Gait / Stairs / Wheelchair Mobility   Ambulation/Gait Ambulation/Gait assistance: Min guard, Mod assist Gait Distance (Feet): 160 Feet Assistive device: Rolling walker (2 wheeled) Gait Pattern/deviations: Decreased step length - right, Narrow base of support, Trunk flexed General Gait Details: Pt ambulated 1 x 160 ft with RW + CGA. He does have slight knee buckling throughout  with occasional min assist required for safety. He ambulated 1 x 10 ft without AD with Mod assist with +1 UE HHA. Pt very unsteady wiihthout BUE support. Gait velocity: decreased     Posture / Balance Balance Overall balance assessment: Needs assistance Sitting-balance support: Bilateral upper extremity supported, Feet supported Sitting balance-Leahy Scale: Fair Standing balance support: Bilateral upper extremity supported Standing balance-Leahy Scale: Fair Standing balance comment: poor with single UE support but fair with BUE support     Special needs/care consideration Designated visitor Demareo Davison (spouse)    Previous Home Environment (from acute therapy documentation) Living Arrangements: Spouse/significant other Available Help at Discharge: Available 24 hours/day, Family Type of Home: House Home Layout: Able to live on main level with bedroom/bathroom Bathroom Shower/Tub: Tub/shower unit, Architectural technologist: Standard(Comfort height) Bathroom Accessibility: Yes How Accessible: Accessible via walker [place]: No   Discharge Living Setting Plans for Discharge Living Setting: Patient's home Type of Home at Discharge: House Discharge Home Layout: Two level, Able to live on main level with bedroom/bathroom, Bed/bath upstairs Alternate Level Stairs-Number of Steps: full flight Discharge Home Access: Ramped entrance Discharge Bathroom Shower/Tub: Tub/shower unit, Walk-in shower(tub/shower downstairs, walk-in upstairs) Discharge Bathroom Toilet: Handicapped height Discharge Bathroom Accessibility: Yes How Accessible: Accessible via walker Does the patient have any problems obtaining your medications?: No   Social/Family/Support Systems Patient Roles: Spouse Anticipated Caregiver: wife, Katharine Look Anticipated Ambulance person Information: [phone] Ability/Limitations of Caregiver: supervision Caregiver Availability: 24/7 Discharge Plan Discussed with Primary  Caregiver: Yes Is Caregiver In Agreement with Plan?: Yes Does Caregiver/Family have Issues with Lodging/Transportation while Pt is in Rehab?: No   Goals Patient/Family Goal for Rehab: PT/OT mod I Expected length of stay: 6-9 days Pt/Family Agrees to Admission and willing to participate: Yes Program Orientation Provided & Reviewed with Pt/Caregiver Including Roles  & Responsibilities: Yes   Decrease burden of Care through IP rehab admission: n/a   Possible need for SNF placement upon discharge: Not anticipated   Patient Condition: I have reviewed medical records from [facility], spoken with CM, and patient and spouse. I discussed via phone  for inpatient rehabilitation assessment.  Patient will benefit from ongoing PT and OT, can actively participate in 3 hours of therapy a day 5 days of the week, and can make measurable gains during the admission.  Patient will also benefit from the coordinated team approach during an Inpatient Acute Rehabilitation admission.  The patient will receive intensive therapy as well as Rehabilitation physician, nursing, social worker, and care management interventions.  Due to safety, skin/wound care, disease management, medication administration, pain management and patient education the patient requires 24 hour a day rehabilitation nursing.  The patient is currently min to mod with mobility and basic ADLs.  Discharge setting and therapy post discharge at home with home health is anticipated.  Patient has agreed to participate in the Acute Inpatient Rehabilitation Program and will admit today.   Preadmission Screen Completed By:  Michel Santee, PT, DPT 07/07/2019 9:58 AM ______________________________________________________________________   Discussed status with Dr. Dagoberto Ligas on 07/07/19  at 10:03 AM  and received approval for admission today.   Admission Coordinator:  Michel Santee, PT, DPT time 10:04 AM Sudie Grumbling 07/07/19     Assessment/Plan: Diagnosis: 1. Does the need  for close, 24 hr/day Medical supervision in concert with the patient's rehab needs make it unreasonable for this patient to be served in a less intensive setting? Yes 2. Co-Morbidities requiring supervision/potential complications: HOH, HTN, Herman lateral thalamic STROKE, needs CEA 3. Due to bowel management, safety, skin/wound care, disease management, medication administration and patient education, does the patient require 24 hr/day rehab nursing? Yes 4. Does the patient require coordinated care of a physician, rehab nurse, PT, OT, and SLP to address physical and functional deficits in the context of the above medical diagnosis(es)? Yes Addressing deficits in the following areas: balance, endurance, locomotion, strength, transferring, bathing, dressing, feeding, grooming, toileting, cognition, language and swallowing 5. Can the patient actively participate in an intensive therapy program of at least 3 hrs of therapy 5 days a week? Yes 6. The potential for patient to make measurable gains while on inpatient rehab is good 7. Anticipated functional outcomes upon discharge from inpatient rehab: modified independent and supervision PT, modified independent and supervision OT, modified independent and supervision SLP 8. Estimated rehab length of stay to reach the above functional goals is: 6-9 days 9. Anticipated discharge destination: Home 10. Overall Rehab/Functional Prognosis: good     MD Signature:

## 2019-07-07 NOTE — Progress Notes (Signed)
Physical Therapy Treatment Patient Details Name: Russell Herman MRN: [id number] DOB: [birth year] Today's Date: 07/07/2019    History of Present Illness 74 y.o. male with medical history significant for dyslipidemia and hypertension who presents to the ER for evaluation of left-sided numbness and tingling in his left leg with inability to bear weight on his left lower extremity.  MRI reveals acute infarct.    PT Comments    Pt was long sitting in bed upon arriving. He agrees to PT session and is cooperative throughout. He is very motivated to participate and agrees to OOB activity. He was able to ambulate one lap in hallway with +1 UE support pushing IV pole. He did have one episode requiring min assist to prevent fall. He did not use AD prior to admission and will need CIR to address balance, strength, and safe functional mobility mobility. He was seated in recliner with son at bedside and call bell in reach. Overall tolerated session well.     Follow Up Recommendations  CIR     Equipment Recommendations  Rolling walker with 5" wheels    Recommendations for Other Services       Precautions / Restrictions Precautions Precautions: Fall Precaution Comments: Moderate Fall Restrictions Weight Bearing Restrictions: No    Mobility  Bed Mobility Overal bed mobility: Modified Independent                Transfers Overall transfer level: Needs assistance Equipment used: Rolling walker (2 wheeled) Transfers: Sit to/from Stand Sit to Stand: Min guard         General transfer comment: CGA to STS from lowest bed height to RW. min assist without for safety  Ambulation/Gait Ambulation/Gait assistance: Min guard Gait Distance (Feet): 160 Feet Assistive device: Rolling walker (2 wheeled) Gait Pattern/deviations: Step-through pattern;Narrow base of support(minimal knee buckling, less than previously observed) Gait velocity: decreased   General Gait Details: pt with less severe  knee buckling this date, ambulated one lap in RN station ~ 160 ft. ambulated 160 ft with pushing IV pole with mostly CGA but one occasion of min assist to prevent fall 2/2 to narrow/scissoring   Stairs             Wheelchair Mobility    Modified Rankin (Stroke Patients Only)       Balance Overall balance assessment: Needs assistance Sitting-balance support: Bilateral upper extremity supported;Feet supported Sitting balance-Leahy Scale: Good Sitting balance - Comments: pt demonstartes good sitting balance with support   Standing balance support: Single extremity supported Standing balance-Leahy Scale: Fair Standing balance comment: pt demonstrates fait standing balance with +1 UE support                            Cognition Arousal/Alertness: Awake/alert Behavior During Therapy: WFL for tasks assessed/performed Overall Cognitive Status: Within Functional Limits for tasks assessed                                 General Comments: Pt is A and O x 4 and motivated to participate in PT session      Exercises      General Comments        Pertinent Vitals/Pain Pain Assessment: No/denies pain    Home Living                      Prior Function  PT Goals (current goals can now be found in the care plan section) Acute Rehab PT Goals Patient Stated Goal: return to PLOF Progress towards PT goals: Progressing toward goals    Frequency    7X/week      PT Plan Current plan remains appropriate    Co-evaluation              AM-PAC PT "6 Clicks" Mobility   Outcome Measure  Help needed turning from your back to your side while in a flat bed without using bedrails?: None Help needed moving from lying on your back to sitting on the side of a flat bed without using bedrails?: None Help needed moving to and from a bed to a chair (including a wheelchair)?: None Help needed standing up from a chair using your arms  (e.g., wheelchair or bedside chair)?: A Little Help needed to walk in hospital room?: A Little Help needed climbing 3-5 steps with a railing? : A Little 6 Click Score: 21    End of Session Equipment Utilized During Treatment: Gait belt Activity Tolerance: Patient tolerated treatment well;Patient limited by fatigue Patient left: in chair;with call bell/phone within reach;with chair alarm set;with family/visitor present Nurse Communication: Mobility status PT Visit Diagnosis: Muscle weakness (generalized) (M62.81);Other symptoms and signs involving the nervous system (R29.898);Hemiplegia and hemiparesis;Other abnormalities of gait and mobility (R26.89) Hemiplegia - Right/Left: Left Hemiplegia - dominant/non-dominant: Non-dominant Hemiplegia - caused by: Cerebral infarction     Time: [id number] PT Time Calculation (min) (ACUTE ONLY): 12 min  Charges:  $Gait Training: 8-22 mins                     Julaine Fusi PTA 07/07/19, 12:09 PM

## 2019-07-07 NOTE — Progress Notes (Signed)
Subjective:  Close to baseline. Appreciate vascular assistance yesterday.   Past Medical History:  Diagnosis Date  . Allergy   . Arthritis   . Cancer ([place])   . GERD (gastroesophageal reflux disease)   . Hypertension   . Reflux   . Wears hearing aid in both ears     Past Surgical History:  Procedure Laterality Date  . cyst removed    . EXCISION OF TONGUE LESION Bilateral 05/05/2019   Procedure: EXCISION OF DORSAL TONGUE LESION;  Surgeon: Margaretha Sheffield, MD;  Location: [facility];  Service: ENT;  Laterality: Bilateral;  . skin cancer removed      Family history: Parents deceased.  Father with melanoma.  Mother with breast cancer.    Social History:  reports that he has never smoked. He has never used smokeless tobacco. He reports previous alcohol use. No history on file for drug.  Allergies:  Allergies  Allergen Reactions  . Losartan Other (See Comments)  . Chlorthalidone Nausea And Vomiting    Can tolerate if taking with Omeprazole  . Keflex [Cephalexin]   . Statins Rash    Myalgias, muscle pain/weakness  . Sulfa Antibiotics Rash  . Sulfamethoxazole-Trimethoprim Rash    Medications: I have reviewed the patient's current medications. Prior to Admission medications   Medication Sig Start Date End Date Taking? Authorizing Provider  chlorthalidone (HYGROTON) 25 MG tablet Take 25 mg by mouth daily.   Yes [provider]  Cholecalciferol (VITAMIN D-3) 1000 UNITS CAPS Take 1,000 Units by mouth daily at 12 noon.    Yes [provider]  omeprazole (PRILOSEC) 20 MG capsule Take 20 mg by mouth daily at 12 noon. 12/14/18  Yes [provider]  fluticasone (FLONASE) 50 MCG/ACT nasal spray Place 2 sprays into both nostrils daily.  03/08/14   [provider]  montelukast (SINGULAIR) 10 MG tablet Take 10 mg by mouth at bedtime.    [provider]    Physical Examination: Blood pressure 116/78, pulse 71, temperature 97.6 F (36.4 C),  temperature source Oral, resp. rate 16, height 5\' 6"  (1.676 m), weight 72 kg, SpO2 100 %.   Neurological Examination   Mental Status: Alert, oriented, thought content appropriate.  Speech fluent without evidence of aphasia.  Able to follow 3 step commands without difficulty.  Left neglect Cranial Nerves: II: Visual fields grossly normal, pupils equal, round, reactive to light and accommodation III,IV, VI: ptosis not present, extra-ocular motions intact bilaterally V,VII: smile symmetric, facial light touch sensation normal bilaterally VIII: hearing normal bilaterally IX,X: gag reflex present XI: bilateral shoulder shrug XII: midline tongue extension Motor: Right : Upper extremity   5/5    Left:     Upper extremity   5-/5  Lower extremity   5/5     Lower extremity   5/5 Tone and bulk:normal tone throughout; no atrophy noted Sensory: Pinprick and light touch decreased in the LUE and LLE Deep Tendon Reflexes: Symmetric throughout Plantars: Right: mute   Left: mute Cerebellar: normal finger-to-noses and normal heel-to-shin testing bilaterally Gait: not tested   Laboratory Studies:  Basic Metabolic Panel: Recent Labs  Lab 07/05/19 0759  NA 139  K 3.2*  CL 101  CO2 27  GLUCOSE 122*  BUN 22  CREATININE 1.19  CALCIUM 9.3    Liver Function Tests: Recent Labs  Lab 07/05/19 0759  AST 29  ALT 24  ALKPHOS 49  BILITOT 1.1  PROT 7.3  ALBUMIN 4.4   No results for input(s):  LIPASE, AMYLASE in the last 168 hours. No results for input(s): AMMONIA in the last 168 hours.  CBC: Recent Labs  Lab 07/05/19 0759  WBC 7.7  NEUTROABS 4.7  HGB 15.5  HCT 45.0  MCV 85.4  PLT 283    Cardiac Enzymes: No results for input(s): CKTOTAL, CKMB, CKMBINDEX, TROPONINI in the last 168 hours.  BNP: Invalid input(s): POCBNP  CBG: Recent Labs  Lab 07/05/19 0751  GLUCAP 111*    Microbiology: Results for orders placed or performed during the hospital encounter of 07/05/19  SARS  Coronavirus 2 by RT PCR (hospital order, performed in [facility] hospital lab) Nasopharyngeal Nasopharyngeal Swab     Status: None   Collection Time: 07/05/19  9:14 AM   Specimen: Nasopharyngeal Swab  Result Value Ref Range Status   SARS Coronavirus 2 NEGATIVE NEGATIVE Final    Comment: (NOTE) SARS-CoV-2 target nucleic acids are NOT DETECTED. The SARS-CoV-2 RNA is generally detectable in upper and lower respiratory specimens during the acute phase of infection. The lowest concentration of SARS-CoV-2 viral copies this assay can detect is 250 copies / mL. A negative result does not preclude SARS-CoV-2 infection and should not be used as the sole basis for treatment or other patient management decisions.  A negative result may occur with improper specimen collection / handling, submission of specimen other than nasopharyngeal swab, presence of viral mutation(s) within the areas targeted by this assay, and inadequate number of viral copies (<250 copies / mL). A negative result must be combined with clinical observations, patient history, and epidemiological information. Fact Sheet for Patients:   StrictlyIdeas.no Fact Sheet for Healthcare Providers: BankingDealers.co.za This test is not yet approved or cleared  by the [place] FDA and has been authorized for detection and/or diagnosis of SARS-CoV-2 by FDA under an Emergency Use Authorization (EUA).  This EUA will remain in effect (meaning this test can be used) for the duration of the COVID-19 declaration under Section 564(b)(1) of the Act, 21 U.S.C. section 360bbb-3(b)(1), unless the authorization is terminated or revoked sooner. Performed at [facility], [address] [place], [place] [address]     Coagulation Studies: Recent Labs    07/05/19 0759  LABPROT 13.7  INR 1.1    Urinalysis: No results for input(s): COLORURINE, LABSPEC, PHURINE, GLUCOSEU, HGBUR,  BILIRUBINUR, KETONESUR, PROTEINUR, UROBILINOGEN, NITRITE, LEUKOCYTESUR in the last 168 hours.  Invalid input(s): APPERANCEUR  Lipid Panel:    Component Value Date/Time   CHOL 193 07/06/2019 0619   TRIG 103 07/06/2019 0619   HDL 33 (L) 07/06/2019 0619   CHOLHDL 5.8 07/06/2019 0619   VLDL 21 07/06/2019 0619   LDLCALC 139 (H) 07/06/2019 0619    HgbA1C:  Lab Results  Component Value Date   HGBA1C 6.2 (H) 07/06/2019    Urine Drug Screen:  No results found for: LABOPIA, COCAINSCRNUR, LABBENZ, AMPHETMU, THCU, LABBARB  Alcohol Level: No results for input(s): ETH in the last 168 hours.  Other results: EKG: normal sinus rhythm at 74 bpm.  Imaging: CT ANGIO NECK W OR WO CONTRAST  Result Date: 07/06/2019 CLINICAL DATA:  Carotid stenosis.  Right thalamic stroke. EXAM: CT ANGIOGRAPHY NECK TECHNIQUE: Multidetector CT imaging of the neck was performed using the standard protocol during bolus administration of intravenous contrast. Multiplanar CT image reconstructions and MIPs were obtained to evaluate the vascular anatomy. Carotid stenosis measurements (when applicable) are obtained utilizing NASCET criteria, using the distal internal carotid diameter as the denominator. CONTRAST:  33mL OMNIPAQUE IOHEXOL 350 MG/ML  SOLN COMPARISON:  Carotid Doppler 07/05/2019 FINDINGS: Aortic arch: Standard branching. Imaged portion shows no evidence of aneurysm or dissection. No significant stenosis of the major arch vessel origins. Mild atherosclerotic calcification aortic arch. Right carotid system: Calcified and noncalcified plaque right carotid bifurcation. Minimal luminal diameter on the sagittal view is 0.86 mm corresponding to 78% diameter stenosis. No intraluminal filling defect. Remainder of the right internal carotid artery is patent. Left carotid system: Mild atherosclerotic disease left carotid bifurcation without stenosis. Vertebral arteries: Both vertebral arteries patent to the basilar. Mild stenosis  proximal right vertebral artery. No significant left vertebral stenosis. Skeleton: Cervical spondylosis diffusely. No focal or acute skeletal abnormality. Other neck: Negative for mass or adenopathy in the neck. Upper chest: Lung apices clear bilaterally. IMPRESSION: 1. 78% diameter stenosis proximal right internal carotid artery due to calcified and noncalcified atherosclerotic plaque. 2. No significant left carotid stenosis 3. Mild stenosis origin of right vertebral artery. Left vertebral artery widely patent. Electronically Signed   By: Franchot Gallo M.D.   On: 07/06/2019 11:36   MR BRAIN WO CONTRAST  Result Date: 07/05/2019 CLINICAL DATA:  Acute presentation with left-sided numbness and tingling affecting the leg. EXAM: MRI HEAD WITHOUT CONTRAST TECHNIQUE: Multiplanar, multiecho pulse sequences of the brain and surrounding structures were obtained without intravenous contrast. COMPARISON:  Head CT same day FINDINGS: Brain: Diffusion imaging shows a small area of acute infarction affecting the lateral thalamus on the right. No sign of swelling or hemorrhage. No other acute finding. Elsewhere, there are mild chronic small-vessel changes of the hemispheric white matter. No large vessel territory infarction. No mass lesion, hemorrhage, hydrocephalus or extra-axial collection. Vascular: Major vessels at the base of the brain show flow. Skull and upper cervical spine: Negative Sinuses/Orbits: Clear/normal Other: None IMPRESSION: Small area of acute infarction in the lateral right thalamus. Mild chronic small-vessel change of the white matter otherwise. No hemorrhage or mass effect. Electronically Signed   By: Nelson Chimes M.D.   On: 07/05/2019 13:39   US Carotid Bilateral (at [facility] and [facility] only)  Result Date: 07/05/2019 CLINICAL DATA:  Carotid artery disease. Acute infarction involving the right lateral thalamus. EXAM: BILATERAL CAROTID DUPLEX ULTRASOUND TECHNIQUE: Pearline Cables scale imaging, color Doppler and duplex  ultrasound were performed of bilateral carotid and vertebral arteries in the neck. COMPARISON:  None. FINDINGS: Criteria: Quantification of carotid stenosis is based on velocity parameters that correlate the residual internal carotid diameter with NASCET-based stenosis levels, using the diameter of the distal internal carotid lumen as the denominator for stenosis measurement. The following velocity measurements were obtained: RIGHT ICA: 247/58 cm/sec CCA: [id number] cm/sec SYSTOLIC ICA/CCA RATIO:  3.5 ECA: 150 cm/sec LEFT ICA: 100/33 cm/sec CCA: [id number] cm/sec SYSTOLIC ICA/CCA RATIO:  1.6 ECA: 85 cm/sec RIGHT CAROTID ARTERY: Echogenic and heterogeneous plaque at the right carotid bulb. External carotid artery is patent with normal waveform. Irregular heterogeneous plaque at the proximal aspect of the internal carotid artery. Evidence for stenosis and turbulent flow in the proximal internal carotid artery. Elevated peak systolic velocity in the proximal internal carotid artery measures 247 cm/sec and end-diastolic velocity measures 58 cm/sec. Mid and distal right internal carotid artery are patent. RIGHT VERTEBRAL ARTERY: Antegrade flow and normal waveform in the right vertebral artery. LEFT CAROTID ARTERY: Mild atherosclerotic disease at the left carotid bulb. Minimal plaque near the proximal external carotid artery. External carotid artery is patent with normal waveform. Small amount of plaque in the proximal internal carotid artery. Normal waveforms and velocities in the  internal carotid artery. LEFT VERTEBRAL ARTERY: Antegrade flow and normal waveform in the left vertebral artery. IMPRESSION: 1. Atherosclerotic plaque involving the right carotid bulb and proximal right internal carotid artery. Estimated degree of stenosis in the right internal carotid artery is greater than 70%. 2. Mild plaque in the left carotid arteries. Estimated degree of stenosis in left internal carotid artery is less than 50%. 3. Patent vertebral  arteries with antegrade flow. Electronically Signed   By: Markus Daft M.D.   On: 07/05/2019 15:34   ECHOCARDIOGRAM COMPLETE BUBBLE STUDY  Result Date: 07/06/2019    ECHOCARDIOGRAM REPORT   Patient Name:   Russell Herman Date of Exam: 07/06/2019 Medical Rec #:  [id number]       Height:       66.0 in Accession #:    [id number]      Weight:       158.7 lb Date of Birth:  12/17/1945        BSA:          1.813 m Patient Age:    75 years        BP:           129/74 mmHg Patient Gender: M               HR:           73 bpm. Exam Location:  [facility] Procedure: 2D Echo, Cardiac Doppler, Color Doppler and Saline Contrast Bubble            Study Indications:     Stroke 434.91  History:         Patient has no prior history of Echocardiogram examinations.                  Risk Factors:Hypertension.  Sonographer:     Sherrie Sport RDCS (AE) Referring Phys:  [id number] LESLIE REYNOLDS Diagnosing Phys: Kate Sable MD  Sonographer Comments: Technically challenging study due to limited acoustic windows. The best view is from the subcostal. IMPRESSIONS  1. Left ventricular ejection fraction, by estimation, is 65 to 70%. The left ventricle has normal function. The left ventricle has no regional wall motion abnormalities. Left ventricular diastolic parameters are consistent with Grade I diastolic dysfunction (impaired relaxation).  2. Right ventricular systolic function is normal. The right ventricular size is normal. There is normal pulmonary artery systolic pressure.  3. The mitral valve is normal in structure. No evidence of mitral valve regurgitation. No evidence of mitral stenosis.  4. The aortic valve is normal in structure. Aortic valve regurgitation is not visualized. No aortic stenosis is present.  5. The inferior vena cava is normal in size with greater than 50% respiratory variability, suggesting right atrial pressure of 3 mmHg.  6. Agitated saline contrast bubble study was negative, with no evidence of any interatrial shunt.  FINDINGS  Left Ventricle: Left ventricular ejection fraction, by estimation, is 65 to 70%. The left ventricle has normal function. The left ventricle has no regional wall motion abnormalities. The left ventricular internal cavity size was normal in size. There is  no left ventricular hypertrophy. Left ventricular diastolic parameters are consistent with Grade I diastolic dysfunction (impaired relaxation). Right Ventricle: The right ventricular size is normal. No increase in right ventricular wall thickness. Right ventricular systolic function is normal. There is normal pulmonary artery systolic pressure. The tricuspid regurgitant velocity is 2.44 m/s, and  with an assumed right atrial pressure of 3 mmHg, the estimated right ventricular systolic pressure  is 26.8 mmHg. Left Atrium: Left atrial size was normal in size. Right Atrium: Right atrial size was normal in size. Pericardium: There is no evidence of pericardial effusion. Mitral Valve: The mitral valve is normal in structure. Normal mobility of the mitral valve leaflets. No evidence of mitral valve regurgitation. No evidence of mitral valve stenosis. Tricuspid Valve: The tricuspid valve is normal in structure. Tricuspid valve regurgitation is trivial. No evidence of tricuspid stenosis. Aortic Valve: The aortic valve is normal in structure. Aortic valve regurgitation is not visualized. No aortic stenosis is present. Aortic valve mean gradient measures 3.5 mmHg. Aortic valve peak gradient measures 5.5 mmHg. Aortic valve area, by VTI measures 2.92 cm. Pulmonic Valve: The pulmonic valve was not well visualized. Pulmonic valve regurgitation is not visualized. No evidence of pulmonic stenosis. Aorta: The aortic root is normal in size and structure. Venous: The inferior vena cava is normal in size with greater than 50% respiratory variability, suggesting right atrial pressure of 3 mmHg. IAS/Shunts: No atrial level shunt detected by color flow Doppler. Agitated saline  contrast was given intravenously to evaluate for intracardiac shunting. Agitated saline contrast bubble study was negative, with no evidence of any interatrial shunt.  LEFT VENTRICLE PLAX 2D LVIDd:         4.24 cm  Diastology LVIDs:         2.13 cm  LV e' lateral:   8.38 cm/s LV PW:         0.78 cm  LV E/e' lateral: 7.7 LV IVS:        0.74 cm  LV e' medial:    6.85 cm/s LVOT diam:     2.20 cm  LV E/e' medial:  9.4 LV SV:         72 LV SV Index:   40 LVOT Area:     3.80 cm  RIGHT VENTRICLE RV Basal diam:  2.61 cm RV S prime:     13.60 cm/s TAPSE (M-mode): 3.7 cm LEFT ATRIUM             Index       RIGHT ATRIUM           Index LA diam:        2.60 cm 1.43 cm/m  RA Area:     12.40 cm LA Vol (A2C):   26.6 ml 14.68 ml/m RA Volume:   24.70 ml  13.63 ml/m LA Vol (A4C):   22.4 ml 12.36 ml/m LA Biplane Vol: 25.2 ml 13.90 ml/m  AORTIC VALVE                   PULMONIC VALVE AV Area (Vmax):    3.06 cm    PV Vmax:        0.78 m/s AV Area (Vmean):   3.01 cm    PV Peak grad:   2.5 mmHg AV Area (VTI):     2.92 cm    RVOT Peak grad: 6 mmHg AV Vmax:           117.50 cm/s AV Vmean:          86.600 cm/s AV VTI:            0.248 m AV Peak Grad:      5.5 mmHg AV Mean Grad:      3.5 mmHg LVOT Vmax:         94.60 cm/s LVOT Vmean:        68.600 cm/s LVOT VTI:  0.190 m LVOT/AV VTI ratio: 0.77  AORTA Ao Root diam: 3.30 cm MITRAL VALVE               TRICUSPID VALVE MV Area (PHT): 4.71 cm    TR Peak grad:   23.8 mmHg MV Decel Time: 161 msec    TR Vmax:        244.00 cm/s MV E velocity: 64.30 cm/s MV A velocity: 86.50 cm/s  SHUNTS MV E/A ratio:  0.74        Systemic VTI:  0.19 m                            Systemic Diam: 2.20 cm Kate Sable MD Electronically signed by Kate Sable MD Signature Date/Time: 07/06/2019/1:46:56 PM    Final     Assessment: 74 y.o. male with medical history significant for dyslipidemia and hypertension who presents to the ER for evaluation of left-sided numbness and weakness.  Patient  outside time window for tPA.  On no antiplatelet therapy prior to admission.  BP controlled.  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   - MRI with with R thalamic stroke. Small vessel disease on imaging  - R ICA stenosis  Over 70 % but don't think contributed to current symptoms  given that stroke is in posterior circulation  - agree dual anti platelet therapy - vascular appreciated. 2-3 week post stroke will need intervention. Will likely have to come off at least Plavix 5 days prior to procedure - rehab d/c today

## 2019-07-07 NOTE — PMR Pre-admission (Signed)
PMR Admission Coordinator Pre-Admission Assessment  Patient: Russell Herman is an 74 y.o., male MRN: [id number] DOB: 08-20-1945 Height: 5\' 6"  (167.6 cm) Weight: 72 kg  Insurance Information HMO:     PPO:      PCP:      IPA:      80/20:      OTHER:  PRIMARY: Medicare A and B      Policy#: [id number]      Subscriber: pt CM Name:       Phone#:      Fax#:  Pre-Cert#: verified Civil engineer, contracting:  Benefits:  Phone #:      Name:  Eff. Date: 01/03/09 (A and B)     Deduct: $1484      Out of Pocket Max: n/a      Life Max: n/a CIR: 100%      SNF: 20 full days Outpatient: 80%     Co-Pay: 20% Home Health: 100%      Co-Pay:  DME: 80%     Co-Pay: 20% Providers: pt choice  SECONDARY: [employer]      Policy#: [id number]     Phone#: [phone]  Financial Counselor:       Phone#:   The "Data Collection Information Summary" for patients in Inpatient Rehabilitation Facilities with attached "Privacy Act Industry Records" was provided and verbally reviewed with: Patient  Emergency Contact Information Contact Information    Name Relation Home Work Mobile   Russell Herman Spouse [phone]        Current Medical History  Patient Admitting Diagnosis: CVA  History of Present Illness: Pt is a 74 y/o male with PMH of HTN who presents to [facility] on 07/05/19 for evaluation of L sided numbness and tingling with LLE weakness. 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  Pt was evaluated by vascular surgeon who recommended CEA as an outpatient.  Therapy evaluations were completed and CIR was recommended due to functional decline.    Complete NIHSS TOTAL: 2  Patient's medical record from [facility] has been reviewed by the rehabilitation admission coordinator and physician.  Past Medical History  Past Medical History:  Diagnosis Date  . Allergy   . Arthritis   . Cancer  ([place])   . GERD (gastroesophageal reflux disease)   . Hypertension   . Reflux   . Wears hearing aid in both ears     Family History   family history is not on file.  Prior Rehab/Hospitalizations Has the patient had prior rehab or hospitalizations prior to admission? No  Has the patient had major surgery during 100 days prior to admission? No   Current Medications  Current Facility-Administered Medications:  .  acetaminophen (TYLENOL) tablet 650 mg, 650 mg, Oral, Q4H PRN **OR** acetaminophen (TYLENOL) 160 MG/5ML solution 650 mg, 650 mg, Per Tube, Q4H PRN **OR** acetaminophen (TYLENOL) suppository 650 mg, 650 mg, Rectal, Q4H PRN, Agbata, Tochukwu, MD .  aspirin EC tablet 81 mg, 81 mg, Oral, Daily, Zhang, Dekui, MD .  clopidogrel (PLAVIX) tablet 75 mg, 75 mg, Oral, Daily, Sharen Hones, MD, 75 mg at 07/06/19 1459 .  fluticasone (FLONASE) 50 MCG/ACT nasal spray 1 spray, 1 spray, Each Nare, Daily, Agbata, Tochukwu, MD .  montelukast (SINGULAIR) tablet 10 mg, 10 mg, Oral, QHS, Agbata, Tochukwu, MD .  pantoprazole (PROTONIX) EC tablet 40 mg, 40  mg, Oral, Daily, Agbata, Tochukwu, MD, 40 mg at 07/06/19 0920 .  rosuvastatin (CRESTOR) tablet 40 mg, 40 mg, Oral, QHS, Sharen Hones, MD, 40 mg at 07/06/19 2147  Patients Current Diet:  Diet Order            Diet - low sodium heart healthy        Diet 2 gram sodium Room service appropriate? Yes; Fluid consistency: Thin  Diet effective now              Precautions / Restrictions Precautions Precautions: Fall Precaution Comments: Moderate Fall Restrictions Weight Bearing Restrictions: No   Has the patient had 2 or more falls or a fall with injury in the past year? No  Prior Activity Level Community (5-7x/wk): independent, no DME used, driving, very active prior to admission  Prior Functional Level Self Care: Did the patient need help bathing, dressing, using the toilet or eating? Independent  Indoor Mobility: Did the patient need  assistance with walking from room to room (with or without device)? Independent  Stairs: Did the patient need assistance with internal or external stairs (with or without device)? Independent  Functional Cognition: Did the patient need help planning regular tasks such as shopping or remembering to take medications? Independent  Home Assistive Devices / Equipment Home Assistive Devices/Equipment: Eyeglasses, Dentures (specify type), Hearing aid Home Equipment: Cane - quad, Hand held shower head  Prior Device Use: Indicate devices/aids used by the patient prior to current illness, exacerbation or injury? None of the above  Current Functional Level Cognition  Overall Cognitive Status: Within Functional Limits for tasks assessed Orientation Level: Oriented X4 General Comments: Pt is A and O x 4 and motivated to participate in OT session    Extremity Assessment (includes Sensation/Coordination)  Upper Extremity Assessment: LUE deficits/detail(RUE WNL) LUE Deficits / Details: RUE grossly 3+/5. Decreased [place] and [place] noted as compared to Herman. Pt with poor grading of movement, tends to significantly overshoot/undershoot target when reaching with LUE. Pt reports decreased sensation and proprioception t/o. States he can't really tell where his LUE is or control where it goes. LUE Sensation: decreased proprioception, decreased light touch LUE Coordination: decreased gross motor, decreased fine motor  Lower Extremity Assessment: Defer to PT evaluation, LLE deficits/detail LLE Deficits / Details: significant decreased sensation/proprioception deficits noted, L knee buckling intermittently with mobility. LLE Sensation: decreased light touch, decreased proprioception LLE Coordination: decreased gross motor, decreased fine motor    ADLs  Overall ADL's : Needs assistance/impaired General ADL Comments: Pt functionally limited by decreased sensation, proprioception, and strength in his LUE/LLE. He  dons/doffs LB clothing given MOD A to maintain standing balance and to bring pants over buttocks. With RW pt is able to stand at sink to perform grooming tasks including oral care with close CGA for safety. With walker removed and no UE supported, pt requires moderate assist to maintain standing balance and increased physical assist for set-up of materials placed outside BOS. Pt also presents with significantly decreased [place] in his LUE which functionally limits his ability to perform two-handed ADL tasks such as buttoning shirts/pants, or applying toothpaste to toothbrush. He requi    Mobility  Overal bed mobility: Modified Independent(HOB elevated, increased time) General bed mobility comments: Deferred. Pt in recliner at start/end of OT session.    Transfers  Overall transfer level: Needs assistance Equipment used: Rolling walker (2 wheeled) Transfers: Sit to/from Stand Sit to Stand: Mod assist, Min guard General transfer comment: Pt requires variable level of assist  with and without AE for mobility. With RW requires min guard with mod cueing for hand/foot placement for STS as well as close Min guard during functional mobility. 2x knee buckling of L noted during mobililty pt maintains standing alance with Min assist and heavy UE use on RW. Without AD, pt requires MOD A HHA to safely come to standing from recliner. Pt requires MOD A given HHA on L side to perform SPT to hospital transport chair at end of session.    Ambulation / Gait / Stairs / Wheelchair Mobility  Ambulation/Gait Ambulation/Gait assistance: Min guard, Mod assist Gait Distance (Feet): 160 Feet Assistive device: Rolling walker (2 wheeled) Gait Pattern/deviations: Decreased step length - right, Narrow base of support, Trunk flexed General Gait Details: Pt ambulated 1 x 160 ft with RW + CGA. He does have slight knee buckling throughout with occasional min assist required for safety. He ambulated 1 x 10 ft without AD with Mod assist  with +1 UE HHA. Pt very unsteady wiihthout BUE support. Gait velocity: decreased    Posture / Balance Balance Overall balance assessment: Needs assistance Sitting-balance support: Bilateral upper extremity supported, Feet supported Sitting balance-Leahy Scale: Fair Standing balance support: Bilateral upper extremity supported Standing balance-Leahy Scale: Fair Standing balance comment: poor with single UE support but fair with BUE support    Special needs/care consideration Designated visitor Elior Aloi (spouse)   Previous Home Environment (from acute therapy documentation) Living Arrangements: Spouse/significant other Available Help at Discharge: Available 24 hours/day, Family Type of Home: House Home Layout: Able to live on main level with bedroom/bathroom Bathroom Shower/Tub: Tub/shower unit, Architectural technologist: Standard(Comfort height) Bathroom Accessibility: Yes How Accessible: Accessible via walker [place]: No  Discharge Living Setting Plans for Discharge Living Setting: Patient's home Type of Home at Discharge: House Discharge Home Layout: Two level, Able to live on main level with bedroom/bathroom, Bed/bath upstairs Alternate Level Stairs-Number of Steps: full flight Discharge Home Access: Ramped entrance Discharge Bathroom Shower/Tub: Tub/shower unit, Walk-in shower(tub/shower downstairs, walk-in upstairs) Discharge Bathroom Toilet: Handicapped height Discharge Bathroom Accessibility: Yes How Accessible: Accessible via walker Does the patient have any problems obtaining your medications?: No  Social/Family/Support Systems Patient Roles: Spouse Anticipated Caregiver: wife, Katharine Look Anticipated Ambulance person Information: [phone] Ability/Limitations of Caregiver: supervision Caregiver Availability: 24/7 Discharge Plan Discussed with Primary Caregiver: Yes Is Caregiver In Agreement with Plan?: Yes Does Caregiver/Family have Issues with  Lodging/Transportation while Pt is in Rehab?: No  Goals Patient/Family Goal for Rehab: PT/OT mod I Expected length of stay: 6-9 days Pt/Family Agrees to Admission and willing to participate: Yes Program Orientation Provided & Reviewed with Pt/Caregiver Including Roles  & Responsibilities: Yes  Decrease burden of Care through IP rehab admission: n/a  Possible need for SNF placement upon discharge: Not anticipated  Patient Condition: I have reviewed medical records from [facility], spoken with CM, and patient and spouse. I discussed via phone for inpatient rehabilitation assessment.  Patient will benefit from ongoing PT and OT, can actively participate in 3 hours of therapy a day 5 days of the week, and can make measurable gains during the admission.  Patient will also benefit from the coordinated team approach during an Inpatient Acute Rehabilitation admission.  The patient will receive intensive therapy as well as Rehabilitation physician, nursing, social worker, and care management interventions.  Due to safety, skin/wound care, disease management, medication administration, pain management and patient education the patient requires 24 hour a day rehabilitation nursing.  The patient is currently min to  mod with mobility and basic ADLs.  Discharge setting and therapy post discharge at home with home health is anticipated.  Patient has agreed to participate in the Acute Inpatient Rehabilitation Program and will admit today.  Preadmission Screen Completed By:  Michel Santee, PT, DPT 07/07/2019 9:58 AM ______________________________________________________________________   Discussed status with Dr. Dagoberto Ligas on 07/07/19  at 10:03 AM  and received approval for admission today.  Admission Coordinator:  Michel Santee, PT, DPT time 10:04 AM Sudie Grumbling 07/07/19    Assessment/Plan: Diagnosis: 1. Does the need for close, 24 hr/day Medical supervision in concert with the patient's rehab needs make it unreasonable  for this patient to be served in a less intensive setting? Yes 2. Co-Morbidities requiring supervision/potential complications: HOH, HTN, Herman lateral thalamic STROKE, needs CEA 3. Due to bowel management, safety, skin/wound care, disease management, medication administration and patient education, does the patient require 24 hr/day rehab nursing? Yes 4. Does the patient require coordinated care of a physician, rehab nurse, PT, OT, and SLP to address physical and functional deficits in the context of the above medical diagnosis(es)? Yes Addressing deficits in the following areas: balance, endurance, locomotion, strength, transferring, bathing, dressing, feeding, grooming, toileting, cognition, language and swallowing 5. Can the patient actively participate in an intensive therapy program of at least 3 hrs of therapy 5 days a week? Yes 6. The potential for patient to make measurable gains while on inpatient rehab is good 7. Anticipated functional outcomes upon discharge from inpatient rehab: modified independent and supervision PT, modified independent and supervision OT, modified independent and supervision SLP 8. Estimated rehab length of stay to reach the above functional goals is: 6-9 days 9. Anticipated discharge destination: Home 10. Overall Rehab/Functional Prognosis: good   MD Signature:

## 2019-07-07 NOTE — Plan of Care (Signed)
  Problem: Consults Goal: RH STROKE PATIENT EDUCATION Description: See Patient Education module for education specifics  Outcome: Progressing Goal: Nutrition Consult-if indicated Outcome: Progressing Goal: Diabetes Guidelines if Diabetic/Glucose > 140 Description: If diabetic or lab glucose is > 140 mg/dl - Initiate Diabetes/Hyperglycemia Guidelines & Document Interventions  Outcome: Progressing   Problem: RH SKIN INTEGRITY Goal: RH STG SKIN FREE OF INFECTION/BREAKDOWN Outcome: Progressing Goal: RH STG MAINTAIN SKIN INTEGRITY WITH ASSISTANCE Description: STG Maintain Skin Integrity With Assistance. Outcome: Progressing   Problem: RH SAFETY Goal: RH STG ADHERE TO SAFETY PRECAUTIONS W/ASSISTANCE/DEVICE Description: STG Adhere to Safety Precautions With Assistance/Device. Outcome: Progressing Goal: RH STG DECREASED RISK OF FALL WITH ASSISTANCE Description: STG Decreased Risk of Fall With Assistance. Outcome: Progressing   Problem: Consults Goal: RH STROKE PATIENT EDUCATION Description: See Patient Education module for education specifics  07/07/2019 1446 by Steve Rattler, RN Outcome: Progressing 07/07/2019 1446 by Steve Rattler, RN Outcome: Progressing Goal: Nutrition Consult-if indicated 07/07/2019 1446 by Steve Rattler, RN Outcome: Progressing 07/07/2019 1446 by Steve Rattler, RN Outcome: Progressing Goal: Diabetes Guidelines if Diabetic/Glucose > 140 Description: If diabetic or lab glucose is > 140 mg/dl - Initiate Diabetes/Hyperglycemia Guidelines & Document Interventions  07/07/2019 1446 by Steve Rattler, RN Outcome: Progressing 07/07/2019 1446 by Steve Rattler, RN Outcome: Progressing   Problem: RH SKIN INTEGRITY Goal: RH STG SKIN FREE OF INFECTION/BREAKDOWN 07/07/2019 1446 by Steve Rattler, RN Outcome: Progressing 07/07/2019 1446 by Steve Rattler, RN Outcome: Progressing Goal: RH STG MAINTAIN SKIN INTEGRITY WITH ASSISTANCE Description: STG Maintain Skin  Integrity With Assistance. 07/07/2019 1446 by Steve Rattler, RN Outcome: Progressing 07/07/2019 1446 by Steve Rattler, RN Outcome: Progressing   Problem: RH SAFETY Goal: RH STG ADHERE TO SAFETY PRECAUTIONS W/ASSISTANCE/DEVICE Description: STG Adhere to Safety Precautions With Assistance/Device. 07/07/2019 1446 by Steve Rattler, RN Outcome: Progressing 07/07/2019 1446 by Steve Rattler, RN Outcome: Progressing Goal: RH STG DECREASED RISK OF FALL WITH ASSISTANCE Description: STG Decreased Risk of Fall With Assistance. 07/07/2019 1446 by Steve Rattler, RN Outcome: Progressing 07/07/2019 1446 by Steve Rattler, RN Outcome: Progressing

## 2019-07-07 NOTE — Discharge Summary (Addendum)
Physician Discharge Summary  [name] MRN: [place]:[id number] DOB/AGE: January 17, 1946 74 y.o.  Admit date: 07/05/2019 Discharge date: 07/07/2019  Admission Diagnoses:  Discharge Diagnoses:  Principal Problem:   TIA (transient ischemic attack) Active Problems:   Hypercholesteremia   Essential hypertension   Hypokalemia   Stroke [facility])   Discharged Condition: good  Hospital Course:  Russell Wille Gordonis a 74 y.o.malewith medical history significant fordyslipidemia and hypertension who presents to the ER for evaluation of left-sided numbness and tingling in his left legwith inability to bear weight on his left lower extremity.Patient'slast known well was last night when he went to bed. He woke up with the symptoms.He denies having any speech or visual problems and has no difficulty swallowing. He denies having any headaches. His CT head did not have any acute changes.  He was seen by neurology.  MRI of the brain which showed infarct in the lateral right thalamus.  Seen by speech therapy, Occupational Therapy as well as physical therapy.  He did well with therapies.  He did not qualify for inpatient rehab.  At this point, his condition had improved.  I will provide the patient with a walker, also sent home PT, OT and RN.  Patient will follow with family doctor, neurology, and vascular surgeon as outpatient.  1.  Right lateral thalamus acute stroke. Continue aspirin, Plavix.  PPI was switched to Protonix to avoid interaction with Plavix.  Continue home physical therapy and Occupational Therapy.  2.  Right internal carotid artery stenosis.  Patient had ultrasound, showed more than 70% stenosis in the internal carotid artery.  CT angiogram of the neck confirmed 78% stenosis.  Patient has been seen by vascular surgeon.  Surgery will be scheduled as outpatient.  3.  Essential hypertension. Resume blood pressure medicines.  4.  Hypokalemia. Supplemented  6/3. Patient was  scheduled to be discharged yesterday.  But I was called by nurse that that he could not walk.  Changed to inpatient status per recommendation from physician consul.  Patient weakness is better, walker will be delivered today.  He is stable to be discharged today. He is accepted to rehab    Consults: neurology  Significant Diagnostic Studies:  CT HEAD WITHOUT CONTRAST  TECHNIQUE: Contiguous axial images were obtained from the base of the skull through the vertex without intravenous contrast.  COMPARISON: None.  FINDINGS: Brain: There is no acute intracranial hemorrhage, mass effect, or edema. Gray-white differentiation is preserved. There is no extra-axial fluid collection. Ventricles and sulci are within normal limits in size and configuration. Minimal patchy hypoattenuation in the supratentorial white matter is nonspecific but may reflect minor chronic microvascular ischemic changes.  Vascular: No hyperdense vessel or unexpected calcification.  Skull: Calvarium is unremarkable.  Sinuses/Orbits: No acute finding.  Other: None.  IMPRESSION: No acute intracranial hemorrhage, mass effect, or evidence of acute infarction.   Electronically Signed By: Macy Mis M.D. On: 07/05/2019 08:20  BILATERAL CAROTID DUPLEX ULTRASOUND  TECHNIQUE: Pearline Cables scale imaging, color Doppler and duplex ultrasound were performed of bilateral carotid and vertebral arteries in the neck.  COMPARISON: None.  FINDINGS: Criteria: Quantification of carotid stenosis is based on velocity parameters that correlate the residual internal carotid diameter with NASCET-based stenosis levels, using the diameter of the distal internal carotid lumen as the denominator for stenosis measurement.  The following velocity measurements were obtained:  RIGHT  ICA: 247/58 cm/sec  CCA: XX123456 cm/sec  SYSTOLIC ICA/CCA RATIO: 3.5  ECA: 150 cm/sec  LEFT  ICA: 100/33  cm/sec  CCA: XX123456 cm/sec  SYSTOLIC ICA/CCA RATIO: 1.6  ECA: 85 cm/sec  RIGHT CAROTID ARTERY: Echogenic and heterogeneous plaque at the right carotid bulb. External carotid artery is patent with normal waveform. Irregular heterogeneous plaque at the proximal aspect of the internal carotid artery. Evidence for stenosis and turbulent flow in the proximal internal carotid artery. Elevated peak systolic velocity in the proximal internal carotid artery measures 247 cm/sec and end-diastolic velocity measures 58 cm/sec. Mid and distal right internal carotid artery are patent.  RIGHT VERTEBRAL ARTERY: Antegrade flow and normal waveform in the right vertebral artery.  LEFT CAROTID ARTERY: Mild atherosclerotic disease at the left carotid bulb. Minimal plaque near the proximal external carotid artery. External carotid artery is patent with normal waveform. Small amount of plaque in the proximal internal carotid artery. Normal waveforms and velocities in the internal carotid artery.  LEFT VERTEBRAL ARTERY: Antegrade flow and normal waveform in the left vertebral artery.  IMPRESSION: 1. Atherosclerotic plaque involving the right carotid bulb and proximal right internal carotid artery. Estimated degree of stenosis in the right internal carotid artery is greater than 70%. 2. Mild plaque in the left carotid arteries. Estimated degree of stenosis in left internal carotid artery is less than 50%. 3. Patent vertebral arteries with antegrade flow.   Electronically Signed By: Markus Daft M.D. On: 07/05/2019   MRI HEAD WITHOUT CONTRAST  TECHNIQUE: Multiplanar, multiecho pulse sequences of the brain and surrounding structures were obtained without intravenous contrast.  COMPARISON: Head CT same day  FINDINGS: Brain: Diffusion imaging shows a small area of acute infarction affecting the lateral thalamus on the right. No sign of swelling or hemorrhage. No other acute  finding. Elsewhere, there are mild chronic small-vessel changes of the hemispheric white matter. No large vessel territory infarction. No mass lesion, hemorrhage, hydrocephalus or extra-axial collection.  Vascular: Major vessels at the base of the brain show flow.  Skull and upper cervical spine: Negative  Sinuses/Orbits: Clear/normal  Other: None  IMPRESSION: Small area of acute infarction in the lateral right thalamus. Mild chronic small-vessel change of the white matter otherwise. No hemorrhage or mass effect.   Electronically Signed By: Nelson Chimes M.D. On: 07/05/2019 13:39  CT ANGIOGRAPHY NECK  TECHNIQUE: Multidetector CT imaging of the neck was performed using the standard protocol during bolus administration of intravenous contrast. Multiplanar CT image reconstructions and MIPs were obtained to evaluate the vascular anatomy. Carotid stenosis measurements (when applicable) are obtained utilizing NASCET criteria, using the distal internal carotid diameter as the denominator.  CONTRAST: 20mL OMNIPAQUE IOHEXOL 350 MG/ML SOLN  COMPARISON: Carotid Doppler 07/05/2019  FINDINGS: Aortic arch: Standard branching. Imaged portion shows no evidence of aneurysm or dissection. No significant stenosis of the major arch vessel origins. Mild atherosclerotic calcification aortic arch.  Right carotid system: Calcified and noncalcified plaque right carotid bifurcation. Minimal luminal diameter on the sagittal view is 0.86 mm corresponding to 78% diameter stenosis. No intraluminal filling defect. Remainder of the right internal carotid artery is patent.  Left carotid system: Mild atherosclerotic disease left carotid bifurcation without stenosis.  Vertebral arteries: Both vertebral arteries patent to the basilar. Mild stenosis proximal right vertebral artery. No significant left vertebral stenosis.  Skeleton: Cervical spondylosis diffusely. No focal or  acute skeletal abnormality.  Other neck: Negative for mass or adenopathy in the neck.  Upper chest: Lung apices clear bilaterally.  IMPRESSION: 1. 78% diameter stenosis proximal right internal carotid artery due to calcified and noncalcified atherosclerotic plaque. 2. No significant left carotid stenosis  3. Mild stenosis origin of right vertebral artery. Left vertebral artery widely patent.   Electronically Signed By: Franchot Gallo M.D. On: 07/06/2019 11:36    Treatments: Stroke work-up and aspirin Plavix.  Discharge Exam: Blood pressure 116/78, pulse 71, temperature 97.6 F (36.4 C), temperature source Oral, resp. rate 16, height 5\' 6"  (1.676 m), weight 72 kg, SpO2 100 %. General appearance: alert and cooperative Resp: clear to auscultation bilaterally Cardio: regular rate and rhythm, S1, S2 normal, no murmur, click, rub or gallop GI: soft, non-tender; bowel sounds normal; no masses,  no organomegaly Extremities: extremities normal, atraumatic, no cyanosis or edema  Disposition: Discharge disposition: 01-Home or Self Care       Discharge Instructions    Diet - low sodium heart healthy   Complete by: As directed    Increase activity slowly   Complete by: As directed    Increase activity slowly   Complete by: As directed      Allergies as of 07/07/2019      Reactions   Losartan Other (See Comments)   Chlorthalidone Nausea And Vomiting   Can tolerate if taking with Omeprazole   Keflex [cephalexin]    Statins Rash   Myalgias, muscle pain/weakness   Sulfa Antibiotics Rash   Sulfamethoxazole-trimethoprim Rash      Medication List    STOP taking these medications   omeprazole 20 MG capsule Commonly known as: PRILOSEC Replaced by: pantoprazole 40 MG tablet     TAKE these medications   aspirin 81 MG EC tablet Take 1 tablet (81 mg total) by mouth daily.   chlorthalidone 25 MG tablet Commonly known as: HYGROTON Take 25 mg by mouth daily.    clopidogrel 75 MG tablet Commonly known as: PLAVIX Take 1 tablet (75 mg total) by mouth daily.   fluticasone 50 MCG/ACT nasal spray Commonly known as: FLONASE Place 2 sprays into both nostrils daily.   montelukast 10 MG tablet Commonly known as: SINGULAIR Take 10 mg by mouth at bedtime.   pantoprazole 40 MG tablet Commonly known as: PROTONIX Take 1 tablet (40 mg total) by mouth daily. Replaces: omeprazole 20 MG capsule   rosuvastatin 40 MG tablet Commonly known as: CRESTOR Take 1 tablet (40 mg total) by mouth at bedtime.   Vitamin D-3 25 MCG (1000 UT) Caps Take 1,000 Units by mouth daily at 12 noon.            Durable Medical Equipment  (From admission, onward)         Start     Ordered   07/06/19 1657  For home use only DME Walker  Once    Question:  Patient needs a walker to treat with the following condition  Answer:  Acute arterial ischemic stroke, multifocal, posterior circulation ([place])   07/06/19 1657         Follow-up Information    Dew, Erskine Squibb, MD. Call.   Specialties: Vascular Surgery, Radiology, Interventional Cardiology Why: Devona Konig to discuss your upcoming surgery Contact information: [address] [place] [address] [phone]        Valera Castle, MD Follow up in 1 week(s).   Specialty: Family Medicine Contact information: [place] [address] [phone]        [facility] Follow up in 2 week(s).   Contact information: [place] [phone]          Signed: Sharen Hones 07/07/2019, 8:44 AM

## 2019-07-07 NOTE — Progress Notes (Signed)
Inpatient Rehabilitation Medication Review by a Pharmacist  A complete drug regimen review was completed for this patient to identify any potential clinically significant medication issues.  Clinically significant medication issues were identified:  no   Type of Medication Issue Identified Description of Issue Plan Plan Accepted by Provider?  Drug Interaction(s) (clinically significant)      Duplicate Therapy      Allergy  Stated allergy to statins but appears to be tolerating rosuvastatin  Continue to monitor for intolerance  Read   No Medication Administration End Date  No end date to DAPT suggested in current Neurology notes.  Per Neurology on 6/3, to continue DAPT for 90 days. Of note, original consult note on 6/1 states 3 weeks. Read  Incorrect Dose      Additional Drug Therapy Needed      Other  Home chlorthalidone held, appears appropriate given permission HTN Continue to monitor blood pressure and fluid status      Name of provider notified for issues identified: Dr. Dagoberto Ligas   Provider Method of Notification: Secure Chat    Pharmacist comments: Confirmed with Neurology to continue DAPT for 90 days. May depend on Vascular procedure as well.   Time spent performing this drug regimen review (minutes):  [place], PharmD, [place], [facility] Clinical Pharmacist  Please check AMION for all [place] phone numbers After 10:00 PM, call [place] [phone]

## 2019-07-07 NOTE — H&P (Signed)
Physical Medicine and Rehabilitation Admission H&P    Chief Complaint  Patient presents with  . Stroke with functional deficits.   . Dizziness and numbness left side    HPI: Russell Herman is a 74 year old RH-male with history of HTN, GERD, BCC, allergies who was admitted to [facility] on 07/05/19 with reports of waking up with left sided weakness, numbness and had difficulty walking. CT head negative for acute changes. MRI brain revealed small acute infarct in right lateral thalamus and mild small vessel disease. CTA head/neck showed 78% stenosis proximal R-ICA due to calcified and non-calcified plaque and mild stenosis origin of R-VA. Dr. Doy Mince  recommended DAPT X 3 weeks followed by ASA alone. Stroke felt to be due to small vessel disease.   2D echo showed EF 65-70% with TVR and bubble study negative. Dr. Lucky Cowboy consulted for input and plans on surgical intervention on outpatient basis after clearance from Cards. Therapy ongoing and patient limited by left sided weakness with left knee instability and decrease in coordination LUE. CIR recommended due to impairments in mobility and ADLs.    Peeing well- LBM this AM.  No pain   Review of Systems  Constitutional: Negative for chills and fever.  HENT: Positive for hearing loss and tinnitus.   Eyes: Negative for blurred vision, double vision and pain.  Respiratory: Negative for cough and sputum production.   Cardiovascular: Negative for chest pain, palpitations and leg swelling.  Gastrointestinal: Positive for heartburn. Negative for abdominal pain, constipation and nausea.  Genitourinary: Negative for dysuria and urgency.  Musculoskeletal: Positive for back pain.  Skin: Negative for itching and rash.  Neurological: Positive for sensory change and focal weakness. Negative for dizziness and headaches.  Psychiatric/Behavioral: Negative for memory loss. The patient does not have insomnia.   All other systems reviewed and are negative.     Past Medical History:  Diagnosis Date  . Adenomatous colon polyp   . Allergy   . Arthritis   . Cancer ([place])   . GERD (gastroesophageal reflux disease)   . Hypertension   . Wears hearing aid in both ears     Past Surgical History:  Procedure Laterality Date  . cyst removed    . EXCISION OF TONGUE LESION Bilateral 05/05/2019   Procedure: EXCISION OF DORSAL TONGUE LESION;  Surgeon: Margaretha Sheffield, MD;  Location: [place];  Service: ENT;  Laterality: Bilateral;  . skin cancer removed      Family History  Problem Relation Age of Onset  . Breast cancer Mother   . Melanoma Father        mets to lung  . Breast cancer Sister   . Kidney failure Brother        s/p transplant     Social History: Married. Retired Administrator.  Independent and active PTA. He has smoked some as a teenager.  He has never used smokeless tobacco. He reports previous alcohol use. No history on file for drug.    Allergies  Allergen Reactions  . Losartan Other (See Comments)    rash  . Chlorthalidone Nausea And Vomiting    Can tolerate if taking with Omeprazole  . Keflex [Cephalexin]     "minor reaction"  . Statins Rash    Myalgias, muscle pain/weakness  . Sulfa Antibiotics Rash  . Sulfamethoxazole-Trimethoprim Rash    Medications Prior to Admission  Medication Sig Dispense Refill  . aspirin EC 81 MG EC tablet Take 1 tablet (81 mg total)  by mouth daily. 30 tablet 0  . chlorthalidone (HYGROTON) 25 MG tablet Take 25 mg by mouth daily.    . Cholecalciferol (VITAMIN D-3) 1000 UNITS CAPS Take 1,000 Units by mouth daily at 12 noon.     . clopidogrel (PLAVIX) 75 MG tablet Take 1 tablet (75 mg total) by mouth daily. 21 tablet 0  . fluticasone (FLONASE) 50 MCG/ACT nasal spray Place 2 sprays into both nostrils daily.   4  . montelukast (SINGULAIR) 10 MG tablet Take 10 mg by mouth at bedtime.    . pantoprazole (PROTONIX) 40 MG tablet Take 1 tablet (40 mg total) by mouth daily. 30 tablet 0  .  rosuvastatin (CRESTOR) 40 MG tablet Take 1 tablet (40 mg total) by mouth at bedtime. 30 tablet 0    Drug Regimen Review  Drug regimen was reviewed and remains appropriate with no significant issues identified  Home:     Functional History:    Functional Status:  Mobility:          ADL:    Cognition:       Weight 72.6 kg. Physical Exam  Nursing note and vitals reviewed. Constitutional: He is oriented to person, place, and time. He appears well-developed and well-nourished.  Younger appearing than stated age; sitting up in bedside chair, appropriate, NAD  HENT:  Head: Normocephalic and atraumatic.  Nose: Nose normal.  Mouth/Throat: Oropharynx is clear and moist. No oropharyngeal exudate.  Face sensation equal Smile equal Tongue midline  Eyes: Conjunctivae are normal.  EOMs Intact B/L- no nystagmus  Neck: No tracheal deviation present.  Cardiovascular:  RRR- no JVD  Respiratory:  CTA B/L- no W/R/R- good air movement   GI: There is no abdominal tenderness.  Soft, NT, ND, (+)BS    Musculoskeletal:        General: No edema.     Cervical back: Normal range of motion and neck supple.     Comments: RUE- Deltoids, biceps, triceps, WE, grip and finger abd 5/5 LUE_ deltoid/biceps 4/5, triceps 4+/5, WE 5-/5, grip 5-/5, finger abd 4/5 RLE- 5/5 in HF, KE, KF, DF and PF LLE- HF 4/5, JE 5-/5, KF 4+/5, DF and PF 5-/5   Neurological: He is alert and oriented to person, place, and time. He displays abnormal reflex.  3+ DTRs in biceps and patella on L NO hoffman's or clonus on L side Sensation to light touch is significantly decreased in LUE and LLE- nml on R side  Skin: Skin is warm and dry.  Well healed scare left forehead.  No skin breakdown on heels/backside  Psychiatric: He has a normal mood and affect.    Results for orders placed or performed during the hospital encounter of 07/05/19 (from the past 48 hour(s))  Hemoglobin A1c     Status: Abnormal   Collection  Time: 07/06/19  6:19 AM  Result Value Ref Range   Hgb A1c MFr Bld 6.2 (H) 4.8 - 5.6 %    Comment: (NOTE) Pre diabetes:          5.7%-6.4% Diabetes:              >6.4% Glycemic control for   <7.0% adults with diabetes    Mean Plasma Glucose 131.24 mg/dL    Comment: Performed at [facility], [place] [address], [place], [place] [address]  Lipid panel     Status: Abnormal   Collection Time: 07/06/19  6:19 AM  Result Value Ref Range   Cholesterol 193 0 -  200 mg/dL   Triglycerides 103 <150 mg/dL   HDL 33 (L) >40 mg/dL   Total CHOL/HDL Ratio 5.8 RATIO   VLDL 21 0 - 40 mg/dL   LDL Cholesterol 139 (H) 0 - 99 mg/dL    Comment:        Total Cholesterol/HDL:CHD Risk Coronary Heart Disease Risk Table                     Men   Women  1/2 Average Risk   3.4   3.3  Average Risk       5.0   4.4  2 X Average Risk   9.6   7.1  3 X Average Risk  23.4   11.0        Use the calculated Patient Ratio above and the CHD Risk Table to determine the patient's CHD Risk.        ATP III CLASSIFICATION (LDL):  <100     mg/dL   Optimal  100-129  mg/dL   Near or Above                    Optimal  130-159  mg/dL   Borderline  160-189  mg/dL   High  >190     mg/dL   Very High Performed at [facility], [place], [place] [address]    CT ANGIO NECK W OR WO CONTRAST  Result Date: 07/06/2019 CLINICAL DATA:  Carotid stenosis.  Right thalamic stroke. EXAM: CT ANGIOGRAPHY NECK TECHNIQUE: Multidetector CT imaging of the neck was performed using the standard protocol during bolus administration of intravenous contrast. Multiplanar CT image reconstructions and MIPs were obtained to evaluate the vascular anatomy. Carotid stenosis measurements (when applicable) are obtained utilizing NASCET criteria, using the distal internal carotid diameter as the denominator. CONTRAST:  50mL OMNIPAQUE IOHEXOL 350 MG/ML SOLN COMPARISON:  Carotid Doppler 07/05/2019 FINDINGS: Aortic arch: Standard branching.  Imaged portion shows no evidence of aneurysm or dissection. No significant stenosis of the major arch vessel origins. Mild atherosclerotic calcification aortic arch. Right carotid system: Calcified and noncalcified plaque right carotid bifurcation. Minimal luminal diameter on the sagittal view is 0.86 mm corresponding to 78% diameter stenosis. No intraluminal filling defect. Remainder of the right internal carotid artery is patent. Left carotid system: Mild atherosclerotic disease left carotid bifurcation without stenosis. Vertebral arteries: Both vertebral arteries patent to the basilar. Mild stenosis proximal right vertebral artery. No significant left vertebral stenosis. Skeleton: Cervical spondylosis diffusely. No focal or acute skeletal abnormality. Other neck: Negative for mass or adenopathy in the neck. Upper chest: Lung apices clear bilaterally. IMPRESSION: 1. 78% diameter stenosis proximal right internal carotid artery due to calcified and noncalcified atherosclerotic plaque. 2. No significant left carotid stenosis 3. Mild stenosis origin of right vertebral artery. Left vertebral artery widely patent. Electronically Signed   By: Franchot Gallo M.D.   On: 07/06/2019 11:36   ECHOCARDIOGRAM COMPLETE BUBBLE STUDY  Result Date: 07/06/2019    ECHOCARDIOGRAM REPORT   Patient Name:   JEMEL HARIS Date of Exam: 07/06/2019 Medical Rec #:  [id number]       Height:       66.0 in Accession #:    [id number]      Weight:       158.7 lb Date of Birth:  12-08-1945        BSA:          1.813 m Patient Age:  74 years        BP:           129/74 mmHg Patient Gender: M               HR:           73 bpm. Exam Location:  [facility] Procedure: 2D Echo, Cardiac Doppler, Color Doppler and Saline Contrast Bubble            Study Indications:     Stroke 434.91  History:         Patient has no prior history of Echocardiogram examinations.                  Risk Factors:Hypertension.  Sonographer:     Sherrie Sport RDCS (AE) Referring Phys:   [id number] LESLIE REYNOLDS Diagnosing Phys: Kate Sable MD  Sonographer Comments: Technically challenging study due to limited acoustic windows. The best view is from the subcostal. IMPRESSIONS  1. Left ventricular ejection fraction, by estimation, is 65 to 70%. The left ventricle has normal function. The left ventricle has no regional wall motion abnormalities. Left ventricular diastolic parameters are consistent with Grade I diastolic dysfunction (impaired relaxation).  2. Right ventricular systolic function is normal. The right ventricular size is normal. There is normal pulmonary artery systolic pressure.  3. The mitral valve is normal in structure. No evidence of mitral valve regurgitation. No evidence of mitral stenosis.  4. The aortic valve is normal in structure. Aortic valve regurgitation is not visualized. No aortic stenosis is present.  5. The inferior vena cava is normal in size with greater than 50% respiratory variability, suggesting right atrial pressure of 3 mmHg.  6. Agitated saline contrast bubble study was negative, with no evidence of any interatrial shunt. FINDINGS  Left Ventricle: Left ventricular ejection fraction, by estimation, is 65 to 70%. The left ventricle has normal function. The left ventricle has no regional wall motion abnormalities. The left ventricular internal cavity size was normal in size. There is  no left ventricular hypertrophy. Left ventricular diastolic parameters are consistent with Grade I diastolic dysfunction (impaired relaxation). Right Ventricle: The right ventricular size is normal. No increase in right ventricular wall thickness. Right ventricular systolic function is normal. There is normal pulmonary artery systolic pressure. The tricuspid regurgitant velocity is 2.44 m/s, and  with an assumed right atrial pressure of 3 mmHg, the estimated right ventricular systolic pressure is 0000000 mmHg. Left Atrium: Left atrial size was normal in size. Right Atrium: Right atrial  size was normal in size. Pericardium: There is no evidence of pericardial effusion. Mitral Valve: The mitral valve is normal in structure. Normal mobility of the mitral valve leaflets. No evidence of mitral valve regurgitation. No evidence of mitral valve stenosis. Tricuspid Valve: The tricuspid valve is normal in structure. Tricuspid valve regurgitation is trivial. No evidence of tricuspid stenosis. Aortic Valve: The aortic valve is normal in structure. Aortic valve regurgitation is not visualized. No aortic stenosis is present. Aortic valve mean gradient measures 3.5 mmHg. Aortic valve peak gradient measures 5.5 mmHg. Aortic valve area, by VTI measures 2.92 cm. Pulmonic Valve: The pulmonic valve was not well visualized. Pulmonic valve regurgitation is not visualized. No evidence of pulmonic stenosis. Aorta: The aortic root is normal in size and structure. Venous: The inferior vena cava is normal in size with greater than 50% respiratory variability, suggesting right atrial pressure of 3 mmHg. IAS/Shunts: No atrial level shunt detected by color flow Doppler. Agitated saline contrast was  given intravenously to evaluate for intracardiac shunting. Agitated saline contrast bubble study was negative, with no evidence of any interatrial shunt.  LEFT VENTRICLE PLAX 2D LVIDd:         4.24 cm  Diastology LVIDs:         2.13 cm  LV e' lateral:   8.38 cm/s LV PW:         0.78 cm  LV E/e' lateral: 7.7 LV IVS:        0.74 cm  LV e' medial:    6.85 cm/s LVOT diam:     2.20 cm  LV E/e' medial:  9.4 LV SV:         72 LV SV Index:   40 LVOT Area:     3.80 cm  RIGHT VENTRICLE RV Basal diam:  2.61 cm RV S prime:     13.60 cm/s TAPSE (M-mode): 3.7 cm LEFT ATRIUM             Index       RIGHT ATRIUM           Index LA diam:        2.60 cm 1.43 cm/m  RA Area:     12.40 cm LA Vol (A2C):   26.6 ml 14.68 ml/m RA Volume:   24.70 ml  13.63 ml/m LA Vol (A4C):   22.4 ml 12.36 ml/m LA Biplane Vol: 25.2 ml 13.90 ml/m  AORTIC VALVE                    PULMONIC VALVE AV Area (Vmax):    3.06 cm    PV Vmax:        0.78 m/s AV Area (Vmean):   3.01 cm    PV Peak grad:   2.5 mmHg AV Area (VTI):     2.92 cm    RVOT Peak grad: 6 mmHg AV Vmax:           117.50 cm/s AV Vmean:          86.600 cm/s AV VTI:            0.248 m AV Peak Grad:      5.5 mmHg AV Mean Grad:      3.5 mmHg LVOT Vmax:         94.60 cm/s LVOT Vmean:        68.600 cm/s LVOT VTI:          0.190 m LVOT/AV VTI ratio: 0.77  AORTA Ao Root diam: 3.30 cm MITRAL VALVE               TRICUSPID VALVE MV Area (PHT): 4.71 cm    TR Peak grad:   23.8 mmHg MV Decel Time: 161 msec    TR Vmax:        244.00 cm/s MV E velocity: 64.30 cm/s MV A velocity: 86.50 cm/s  SHUNTS MV E/A ratio:  0.74        Systemic VTI:  0.19 m                            Systemic Diam: 2.20 cm Kate Sable MD Electronically signed by Kate Sable MD Signature Date/Time: 07/06/2019/1:46:56 PM    Final        Medical Problem List and Plan: 1.  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  Antithrombotics: -DVT/anticoagulation:  Pharmaceutical: Lovenox  -antiplatelet therapy: DAPT 3. Pain Management: Tylenol prn.  4. Mood: LCSW to follow for evaluation and support.   -antipsychotic agents: N/A 5. Neuropsych: This patient is capable of making decisions on his own behalf. 6. Skin/Wound Care:  Routine pressure relief measures.  7. Fluids/Electrolytes/Nutrition: Monitor I/O. Check lytes in am.  8. R-CAS 70%: To follow up with Dr.  Charlyn Minerva. HTN: Monitor BP tid--meds have been on hold. Avoid hypotension to allow for adequate perfusion. Will resume  10. Dyslipidemia: LDL-131. Now on Crestor.  11. Hypokalemia: Supplemented X 2 on 6/2-->recheck labs in am.  12. H/o GERD/esophagitis: Continue protonix- used omeprazole at home, but needs Protonix since doesn't interact with Plavix.    Ivan Anchors Love,  PA-C 07/07/2019   I have personally performed a face to face diagnostic evaluation of this patient and formulated the key components of the plan.  Additionally, I have personally reviewed laboratory data, imaging studies, as well as relevant notes and concur with the physician assistant's documentation above.   The patient's status has not changed from the original H&P.  Any changes in documentation from the acute care chart have been noted above.     Courtney Heys, MD 07/07/2019

## 2019-07-07 NOTE — Progress Notes (Signed)
DAPT to continue for 3 months per Dr. Maretta Bees

## 2019-07-07 NOTE — Progress Notes (Signed)
Patient arrived to unit via Ambulance with personal belongings. No acute distress noted. Patient introduced to unit and appears to be resting comfortably in chair, call light within reach.

## 2019-07-08 ENCOUNTER — Inpatient Hospital Stay (HOSPITAL_COMMUNITY): Payer: Medicare Other | Admitting: Occupational Therapy

## 2019-07-08 ENCOUNTER — Inpatient Hospital Stay (HOSPITAL_COMMUNITY): Payer: Medicare Other | Admitting: Physical Therapy

## 2019-07-08 DIAGNOSIS — I63331 Cerebral infarction due to thrombosis of right posterior cerebral artery: Secondary | ICD-10-CM

## 2019-07-08 LAB — CBC WITH DIFFERENTIAL/PLATELET
Abs Immature Granulocytes: 0.04 10*3/uL (ref 0.00–0.07)
Basophils Absolute: 0.1 10*3/uL (ref 0.0–0.1)
Basophils Relative: 1 %
Eosinophils Absolute: 0.3 10*3/uL (ref 0.0–0.5)
Eosinophils Relative: 3 %
HCT: 44.5 % (ref 39.0–52.0)
Hemoglobin: 15.7 g/dL (ref 13.0–17.0)
Immature Granulocytes: 0 %
Lymphocytes Relative: 27 %
Lymphs Abs: 2.7 10*3/uL (ref 0.7–4.0)
MCH: 30.7 pg (ref 26.0–34.0)
MCHC: 35.3 g/dL (ref 30.0–36.0)
MCV: 86.9 fL (ref 80.0–100.0)
Monocytes Absolute: 0.9 10*3/uL (ref 0.1–1.0)
Monocytes Relative: 9 %
Neutro Abs: 5.9 10*3/uL (ref 1.7–7.7)
Neutrophils Relative %: 60 %
Platelets: 253 10*3/uL (ref 150–400)
RBC: 5.12 MIL/uL (ref 4.22–5.81)
RDW: 12.9 % (ref 11.5–15.5)
WBC: 9.9 10*3/uL (ref 4.0–10.5)
nRBC: 0 % (ref 0.0–0.2)

## 2019-07-08 LAB — COMPREHENSIVE METABOLIC PANEL
ALT: 24 U/L (ref 0–44)
AST: 32 U/L (ref 15–41)
Albumin: 3.9 g/dL (ref 3.5–5.0)
Alkaline Phosphatase: 43 U/L (ref 38–126)
Anion gap: 12 (ref 5–15)
BUN: 21 mg/dL (ref 8–23)
CO2: 23 mmol/L (ref 22–32)
Calcium: 9.4 mg/dL (ref 8.9–10.3)
Chloride: 103 mmol/L (ref 98–111)
Creatinine, Ser: 1.2 mg/dL (ref 0.61–1.24)
GFR calc Af Amer: >60 mL/min (ref >60)
GFR calc non Af Amer: 59 mL/min — ABNORMAL LOW (ref >60)
Glucose, Bld: 105 mg/dL — ABNORMAL HIGH (ref 70–99)
Potassium: 4.1 mmol/L (ref 3.5–5.1)
Sodium: 138 mmol/L (ref 135–145)
Total Bilirubin: 1.5 mg/dL — ABNORMAL HIGH (ref 0.3–1.2)
Total Protein: 6.4 g/dL — ABNORMAL LOW (ref 6.5–8.1)

## 2019-07-08 NOTE — Evaluation (Signed)
Occupational Therapy Assessment and Plan  Patient Details  Name: Russell Herman MRN: [id number] Date of Birth: 08/20/1945  OT Diagnosis: hemiplegia affecting non-dominant side, muscle weakness (generalized) and impaired sensation Rehab Potential: Rehab Potential (ACUTE ONLY): Excellent ELOS: 7-10 days   Today's Date: 07/08/2019 OT Individual Time: 1000-1057 OT Individual Time Calculation (min): 57 min     Problem List:  Patient Active Problem List   Diagnosis Date Noted  . Stroke ([place]) 07/06/2019  . TIA (transient ischemic attack) 07/05/2019  . Essential hypertension 07/05/2019  . Hypokalemia 07/05/2019  . Adenomatous colon polyp 01/31/2014  . Basal cell carcinoma 09/23/2013  . Hay fever 09/23/2013  . Vitamin D deficiency 09/23/2013  . Gastric catarrh 11/28/2011  . Hypercholesteremia 11/28/2011  . Laboratory examination 11/28/2011    Past Medical History:  Past Medical History:  Diagnosis Date  . Adenomatous colon polyp   . Allergy   . Arthritis   . Cancer ([place])   . GERD (gastroesophageal reflux disease)   . Hypertension   . Wears hearing aid in both ears    Past Surgical History:  Past Surgical History:  Procedure Laterality Date  . cyst removed    . EXCISION OF TONGUE LESION Bilateral 05/05/2019   Procedure: EXCISION OF DORSAL TONGUE LESION;  Surgeon: Margaretha Sheffield, MD;  Location: [place];  Service: ENT;  Laterality: Bilateral;  . skin cancer removed      Assessment & Plan Clinical Impression: BISHOY CUPP is a 74 year old RH-male with history of HTN, GERD, BCC, allergies who was admitted to [facility] on 07/05/19 with reports of waking up with left sided weakness, numbness and had difficulty walking. CT head negative for acute changes. MRI brain revealed small acute infarct in right lateral thalamus and mild small vessel disease. CTA head/neck showed 78% stenosis proximal R-ICA due to calcified and non-calcified plaque and mild stenosis origin of R-VA. Dr.  Doy Mince  recommended DAPT X 3 weeks followed by ASA alone. Stroke felt to be due to small vessel disease.   2D echo showed EF 65-70% with TVR and bubble study negative. Dr. Lucky Cowboy consulted for input and plans on surgical intervention on outpatient basis after clearance from Cards. Therapy ongoing and patient limited by left sided weakness with left knee instability and decrease in coordination LUE. CIR recommended due to impairments in mobility and ADLs.    Patient currently requires min with basic self-care skills secondary to muscle weakness, unbalanced muscle activation and decreased coordination, decreased attention to left and decreased standing balance, decreased postural control, hemiplegia and decreased balance strategies.  Prior to hospitalization, patient could complete BADLs and IADLs with independent .  Patient will benefit from skilled intervention to increase independence with basic self-care skills prior to discharge home with spouse Katharine Look.  Anticipate patient will require intermittent supervision and follow up home health.  OT Assessment Rehab Potential (ACUTE ONLY): Excellent OT Barriers to Discharge: Medical stability OT Patient demonstrates impairments in the following area(s): Balance;Perception;Safety;Sensory;Motor OT Basic ADL's Functional Problem(s): Grooming;Bathing;Dressing;Toileting OT Advanced ADL's Functional Problem(s): Laundry OT Transfers Functional Problem(s): Toilet;Tub/Shower OT Additional Impairment(s): None OT Plan OT Intensity: Minimum of 1-2 x/day, 45 to 90 minutes OT Frequency: 5 out of 7 days OT Duration/Estimated Length of Stay: 7-10 days OT Treatment/Interventions: Balance/vestibular training;Discharge planning;Pain management;Self Care/advanced ADL retraining;Therapeutic Activities;UE/LE Coordination activities;Visual/perceptual remediation/compensation;Therapeutic Exercise;Patient/family education;Functional mobility training;Disease  mangement/prevention;Community reintegration;DME/adaptive equipment instruction;Neuromuscular re-education;Psychosocial support;UE/LE Strength taining/ROM;Wheelchair propulsion/positioning OT Self Feeding Anticipated Outcome(s): No goal OT Basic Self-Care Anticipated Outcome(s): [place] I  OT Toileting Anticipated Outcome(s): Mod I OT Bathroom Transfers Anticipated Outcome(s): Supervision/setup-Mod I OT Recommendation Recommendations for Other Services: Therapeutic Recreation consult Therapeutic Recreation Interventions: Other (comment)(leisure pursuits- adaptive gardening?) Patient destination: Home Follow Up Recommendations: Home health OT Equipment Recommended: To be determined  Skilled Therapeutic Intervention Skilled OT session completed with focus on initial evaluation, education on OT role/POC, and establishment of patient-centered goals.   Pt greeted in his w/c with no c/o pain. Requesting to shower. Pt completed bathing (sit<stand from TTB), dressing (sit<stand from standard toilet without AD), grooming tasks (standing at the sink) and 9 Hole Peg Test (sitting EOB) during session. Ambulatory transfer to shower completed using RW with CGA. Pt with decreased Lt hand coordination, dropped wash cloth x1 when using affected limb to wash his foot and also had some trouble using Lt to open the soap bottle. Lt inattention noted when transferring to toilet after without AD and Min A for ambulation, also noted when dressing. He required supervision for dynamic siting and CGA for dynamic standing during all portions of ADL today. Mild Rt lean when completing grooming tasks at the sink after. Pts results of 9 Hole Peg Test included 27 seconds with the Rt hand, 40 seconds with the Lt, indicative of UE coordination discrepancies. Mild strength deficit on the Lt assessed as well. Pt would benefit from strength/coordination HEPs for the Lt UE. At end of session pt transferred to the recliner and was  left with all needs within reach and chair alarm set.     OT Evaluation Precautions/Restrictions  Precautions Precautions: Fall Precaution Comments: Mild Lt hemi Restrictions Weight Bearing Restrictions: No Pain: no c/o pain during tx    Home Living/Prior [place] expects to be discharged to:: Private residence Living Arrangements: Spouse/significant other Available Help at Discharge: Available 24 hours/day, Family Type of Home: House Home Access: Ramped entrance Home Layout: Able to live on main level with bedroom/bathroom, Two level Bathroom Shower/Tub: Chiropodist: Handicapped height Bathroom Accessibility: Yes  Lives With: [name] IADL History Homemaking Responsibilities: Yes(pt reported being independent with laundry completion PTA. He also used to [employer] the lawn and do various yard work tasks himself, as well as garden) Occupation: Retired Type of Occupation: Truck Geophysicist/field seismologist for [employer]: Yard work + gardens + walks around his yard Prior Function Level of Independence: Independent with basic ADLs, Independent with homemaking with ambulation Driving: Yes ADL ADL Eating: Not assessed Grooming: Contact guard Where Assessed-Grooming: Standing at sink Upper Body Bathing: Setup Where Assessed-Upper Body Bathing: Shower Lower Body Bathing: Contact guard Where Assessed-Lower Body Bathing: Shower Upper Body Dressing: Setup Where Assessed-Upper Body Dressing: Other (Comment)(sitting on standard toilet) Lower Body Dressing: Contact guard Where Assessed-Lower Body Dressing: Other (Comment)(sitting on toilet) Toileting: Not assessed Toilet Transfer: Minimal assistance Toilet Transfer Method: Ambulating(without AD) Science writer: Energy manager: Curator Method: Ambulating(using RW) Youth worker: Radio broadcast assistant, Grab  bars Vision Baseline Vision/History: Wears glasses Wears Glasses: Reading only Patient Visual Report: No change from baseline Vision Assessment?: No apparent visual deficits Perception  Perception: Impaired Inattention/Neglect: Does not attend to left visual field Praxis Praxis: Intact Cognition Arousal/Alertness: Awake/alert Orientation Level: Person;Place;Situation Person: Oriented Place: Oriented Situation: Oriented Year: 2021 Month: May Day of Week: Correct Memory: Appears intact Immediate Memory Recall: Sock;Blue;Bed Memory Recall Sock: Without Cue Memory Recall Blue: Without Cue Memory Recall Bed: Without Cue Attention: Sustained Sustained Attention: Appears intact Safety/Judgment: Appears intact Sensation  Sensation Light Touch: Impaired Detail(Pt reports numbness/tingling in Lt UE/LE) Coordination Gross Motor Movements are Fluid and Coordinated: No Fine Motor Movements are Fluid and Coordinated: No Coordination and Movement Description: Mild Lt hemi with strength and coordination deficits in the UE Finger Nose Finger Test: Dysmetria Lt UE, WNL Rt UE 9 Hole Peg Test: Rt hand: 27 seconds; Lt hand: 40 seconds Motor  Motor Motor: Hemiplegia Motor - Skilled Clinical Observations: Mild Lt hemiplegia Mobility    Min A toilet transfer ambulating without AD, CGA ambulatory shower transfer ambulating with RW  Trunk/Postural Assessment  Cervical Assessment Cervical Assessment: Within Functional Limits Thoracic Assessment Thoracic Assessment: Within Functional Limits Lumbar Assessment Lumbar Assessment: Within Functional Limits Postural Control Postural Control: Deficits on evaluation(Mild Rt lean in standing, mild posterior bias with 1 posterior LOB in the shower)  Balance Balance Balance Assessed: Yes Dynamic Sitting Balance Dynamic Sitting - Level of Assistance: 5: Stand by assistance(donning pants while sitting unsupported on the toilet) Dynamic Standing  Balance Dynamic Standing - Level of Assistance: 4: Min assist(Toilet transfer without AD and also LB dressing sit<stand from standard toilet) Extremity/Trunk Assessment RUE Assessment RUE Assessment: Within Functional Limits Active Range of Motion (AROM) Comments: WNL General Strength Comments: 4+/5 grossly LUE Assessment LUE Assessment: Within Functional Limits Active Range of Motion (AROM) Comments: WNL General Strength Comments: 4/5 grossly   Refer to Care Plan for Long Term Goals  Recommendations for other services: Therapeutic Recreation  Other leisure pursuits/adaptive gardening   Discharge Criteria: Patient will be discharged from OT if patient refuses treatment 3 consecutive times without medical reason, if treatment goals not met, if there is a change in medical status, if patient makes no progress towards goals or if patient is discharged from hospital.  The above assessment, treatment plan, treatment alternatives and goals were discussed and mutually agreed upon: by patient  Skeet Simmer 07/08/2019, 11:34 AM

## 2019-07-08 NOTE — Progress Notes (Signed)
Inpatient Rehabilitation Care Coordinator Assessment and Plan  Patient Details  Name: Russell Herman MRN: [id number] Date of Birth: 03/12/1945  Today's Date: 07/08/2019  Problem List:  Patient Active Problem List   Diagnosis Date Noted  . Stroke ([place]) 07/06/2019  . TIA (transient ischemic attack) 07/05/2019  . Essential hypertension 07/05/2019  . Hypokalemia 07/05/2019  . Adenomatous colon polyp 01/31/2014  . Basal cell carcinoma 09/23/2013  . Hay fever 09/23/2013  . Vitamin D deficiency 09/23/2013  . Gastric catarrh 11/28/2011  . Hypercholesteremia 11/28/2011  . Laboratory examination 11/28/2011   Past Medical History:  Past Medical History:  Diagnosis Date  . Adenomatous colon polyp   . Allergy   . Arthritis   . Cancer ([place])   . GERD (gastroesophageal reflux disease)   . Hypertension   . Wears hearing aid in both ears    Past Surgical History:  Past Surgical History:  Procedure Laterality Date  . cyst removed    . EXCISION OF TONGUE LESION Bilateral 05/05/2019   Procedure: EXCISION OF DORSAL TONGUE LESION;  Surgeon: Margaretha Sheffield, MD;  Location: [place];  Service: ENT;  Laterality: Bilateral;  . skin cancer removed     Social History:  reports that he has never smoked. He has never used smokeless tobacco. He reports previous alcohol use. No history on file for drug.  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[employer]) Read: Yes Write: Yes Employment Status: Disabled   Abuse/Neglect Abuse/Neglect Assessment Can Be Completed: Yes Physical Abuse: Denies Verbal Abuse: Denies Sexual Abuse: Denies Exploitation of patient/patient's  resources: Denies Self-Neglect: Denies  Emotional Status Pt's affect, behavior and adjustment status: no Recent Psychosocial Issues: no Psychiatric History: no Substance Abuse History: no  Patient / Family Perceptions, Expectations & Goals Pt/Family understanding of illness & functional limitations: yes Pt/family expectations/goals: Goal to discharge back home with spouse  [employer]: None Premorbid Home Care/DME Agencies: None Transportation available at discharge: family able to transport  Discharge Planning Living Arrangements: Spouse/significant other Support Systems: Children, Spouse/significant other Type of Residence: Private residence(2 level home. 12 to enter front foor with railings, back door had ramp. About 20-24 steps (has bedroom downstairs)) Insurance Resources: Chartered certified accountant Resources: Social Security Living Expenses: Own Money Management: Patient Does the patient have any problems obtaining your medications?: No Care Coordinator Barriers to Discharge: Home environment access/layout Care Coordinator Barriers to Discharge Comments: 2 level home ( has bed and bath on 1st floor). 12 steps to enter front door, no railings. Pt uses back door with ramp. Care Coordinator Anticipated Follow Up Needs: HH/OP Expected length of stay: 6-9 Days  Clinical Impression Sw met patient introduced self. Explained role and processes SW will continue to follow up for questions and concerns.    Dyanne Iha 07/08/2019, 1:52 PM

## 2019-07-08 NOTE — Progress Notes (Signed)
[name], male   DOB: 12-24-1945, 74 y.o.   MRN: [id number]  Met with patient to review care manager role and collaboration with SW-Christina for discharge planning. Reviewed nursing educational handouts/resources for management of HLD,  HTN, risk factors for stroke and secondary stroke prevention interventions. Patient noted his BP started going up about three years ago but he never did anything other than take medication for the HTN.Reports little weakness in left side and poor proprioception of the left leg with tingling in the left leg after the stroke. He reports he was not aware of the correlation between excessive fat and cholesterol and sugar in the diet and stroke risks. States an understanding of information reviewed and to share information with his wife. Notes supportive family, able to assist him during recovery with CEA scheduled for 07/28/19 and his wife is set to start radiation therapy for breast cancer treatment.

## 2019-07-08 NOTE — Plan of Care (Addendum)
Nutrition Education Note  RD consulted for nutrition education regarding pre-diabetes and a heart healthy diet. RD working remotely.  Spoke with pt via phone call to room. Pt is in good spirits and reports that he has a great appetite and is eating well at CIR. Pt reports that he has been unable to order all of the foods he would like due to diet restrictions. Pt reports that he has been told to "watch his carbs" and decrease salt intake.  Discussed pt's typical daily food intake. Pt typically eats 3 meals and 1-2 snacks daily. Pt drinks mostly water but may have Gatorade if he has worked out in the yard. Pt occasionally drinks coffee in the morning but drinks it without added sweetener or creamer.  Breakfast: yogurt and banana OR eggs and honey wheat toast Lunch: leftover from dinner the night before Dinner: chili OR spaghetti OR half of a freezer pizza Snack: 2 mandarin oranges OR apple  Pt reports that he is active in his garden and grows tomatoes, green beans, butter beans, and black-eyed peas. Pt is concerned that he will be unable to eat the peas from his garden due to carbohydrate content. RD discussed importance of watching portion sizes but that pt can certainly eat the foods he grows in his garden.  Lab Results  Component Value Date   HGBA1C 6.2 (H) 07/06/2019    RD has attached "Heart Healthy Consistent Carbohydrate Nutrition Therapy" handout from the Academy of Nutrition and Dietetics to pt's AVS/Discharge Instructions. Pt is aware of this. Discussed different food groups and their effects on blood sugar, emphasizing carbohydrate-containing foods. Provided list of carbohydrates and recommended serving sizes of common foods.  Discussed importance of controlled and consistent carbohydrate intake throughout the day. Provided examples of ways to balance meals/snacks and encouraged intake of high-fiber, whole grain complex carbohydrates. For example, recommended that pt switch from honey  wheat toast to whole wheat toast when he has toast with breakfast in the morning. Teach back method used.  Lipid Panel     Component Value Date/Time   CHOL 193 07/06/2019 0619   TRIG 103 07/06/2019 0619   HDL 33 (L) 07/06/2019 0619   CHOLHDL 5.8 07/06/2019 0619   VLDL 21 07/06/2019 0619   LDLCALC 139 (H) 07/06/2019 0619    Provided examples on ways to decrease sodium and fat intake in diet. Discouraged intake of processed foods and use of salt shaker. Encouraged fresh fruits and vegetables as well as whole grain sources of carbohydrates to maximize fiber intake. Teach back method used.  Expect excellent compliance.  Body mass index is 25.82 kg/m. Pt meets criteria for overweight based on current BMI.  Current diet order is Heart Healthy/Carb Modified, patient is consuming approximately 100% of meals at this time. Labs and medications reviewed. No further nutrition interventions warranted at this time. RD contact information provided. If additional nutrition issues arise, please re-consult RD.   Gaynell Face, MS, RD, LDN Inpatient Clinical Dietitian Pager: [phone] Weekend/After Hours: [phone]

## 2019-07-08 NOTE — IPOC Note (Addendum)
Overall Plan of Care [facility]) Patient Details Name: Russell Herman MRN: [id number] DOB: 28-Apr-1945  Admitting Diagnosis: Stroke [facility])  Hospital Problems: Principal Problem:   Stroke [facility]) Active Problems:   Decreased GFR   Dyslipidemia   AKI (acute kidney injury) ([place])   Hemiparesis affecting left side as late effect of stroke ([place])     Functional Problem List: Nursing Medication Management, Endurance, Sensory  PT Balance, Endurance, Motor, Perception, Safety, Sensory  OT Balance, Perception, Safety, Sensory, Motor  SLP    TR         Basic ADL's: OT Grooming, Bathing, Dressing, Toileting     Advanced  ADL's: OT Laundry     Transfers: PT Bed Mobility, Bed to Chair, Car, [employer], Futures trader, Metallurgist: PT Ambulation, Emergency planning/management officer, Stairs     Additional Impairments: OT None  SLP        TR      Anticipated Outcomes Item Anticipated Outcome  Self Feeding No goal  Swallowing      Basic self-care  Supervision-Mod I  Toileting  Mod I   Bathroom Transfers Supervision/setup-Mod I  Bowel/Bladder  Remain continent  Transfers  Mod I with LRAD  Locomotion  Mod I with LRAD at ambulatory level  Communication     Cognition     Pain  No issues  Safety/Judgment  Patient will remain free of fall/injury during length of stay with supervision/cues/reminders   Therapy Plan: PT Intensity: Minimum of 1-2 x/day ,45 to 90 minutes PT Frequency: 5 out of 7 days PT Duration Estimated Length of Stay: 7-10 days OT Intensity: Minimum of 1-2 x/day, 45 to 90 minutes OT Frequency: 5 out of 7 days OT Duration/Estimated Length of Stay: 7-10 days     Due to the current state of emergency, patients may not be receiving their 3-hours of Medicare-mandated therapy.   Team Interventions: Nursing Interventions Patient/Family Education, Disease Management/Prevention, Medication Management, Discharge Planning  PT interventions Ambulation/gait  training, Community reintegration, DME/adaptive equipment instruction, Neuromuscular re-education, Psychosocial support, Stair training, UE/LE Strength taining/ROM, Wheelchair propulsion/positioning, UE/LE Coordination activities, Therapeutic Activities, Skin care/wound management, Functional electrical stimulation, Discharge planning, Balance/vestibular training, Cognitive remediation/compensation, Disease management/prevention, Functional mobility training, Patient/family education, Splinting/orthotics, Visual/perceptual remediation/compensation, Pain management  OT Interventions Balance/vestibular training, Discharge planning, Pain management, Self Care/advanced ADL retraining, Therapeutic Activities, UE/LE Coordination activities, Visual/perceptual remediation/compensation, Therapeutic Exercise, Patient/family education, Functional mobility training, Disease mangement/prevention, Community reintegration, Engineer, drilling, Neuromuscular re-education, Psychosocial support, UE/LE Strength taining/ROM, Wheelchair propulsion/positioning  SLP Interventions    TR Interventions    SW/CM Interventions Discharge Planning, Psychosocial Support, Patient/Family Education   Barriers to Discharge MD  Medical stability  Nursing Decreased caregiver support    PT Decreased caregiver support, Home environment access/layout    OT Medical stability    SLP      SW Home environment access/layout 2 level home ( has bed and bath on 1st floor). 12 steps to enter front door, no railings. Pt uses back door with ramp.   Team Discharge Planning: Destination: PT-Home ,OT- Home , SLP-  Projected Follow-up: PT-Home health PT, OT-  Home health OT, SLP-  Projected Equipment Needs: PT-To be determined, Rolling walker with 5" wheels, OT- To be determined, SLP-  Equipment Details: PT- , OT-  Patient/family involved in discharge planning: PT- Patient,  OT-Patient, SLP-   MD ELOS: 14-18d Medical Rehab  Prognosis:  Excellent Assessment:  74 year old RH-male with history of HTN, GERD, BCC, allergies  who was admitted to [facility] on 07/05/19 with reports of waking up with left sided weakness, numbness and had difficulty walking. CT head negative for acute changes. MRI brain revealed small acute infarct in right lateral thalamus and mild small vessel disease. CTA head/neck showed 78% stenosis proximal R-ICA due to calcified and non-calcified plaque and mild stenosis origin of R-VA. Dr. Doy Mince  recommended DAPT X 3 weeks followed by ASA alone. Stroke felt to be due to small vessel disease.   2D echo showed EF 65-70% with TVR and bubble study negative. Dr. Lucky Cowboy consulted for input and plans on surgical intervention on outpatient basis after clearance from Cards. Therapy ongoing and patient limited by left sided weakness with left knee instability and decrease in coordination LUE. CIR recommended due to impairments in mobility and ADLs.    See Team Conference Notes for weekly updates to the plan of care

## 2019-07-08 NOTE — Progress Notes (Signed)
[facility] PHYSICAL MEDICINE & REHABILITATION PROGRESS NOTE   Subjective/Complaints:  Pt cannot tell what Left arm and leg are doing, some cold sensation Left side of face   ROS: Neg CP, SOB, N/V/D  Objective:   CT ANGIO NECK W OR WO CONTRAST  Result Date: 07/06/2019 CLINICAL DATA:  Carotid stenosis.  Right thalamic stroke. EXAM: CT ANGIOGRAPHY NECK TECHNIQUE: Multidetector CT imaging of the neck was performed using the standard protocol during bolus administration of intravenous contrast. Multiplanar CT image reconstructions and MIPs were obtained to evaluate the vascular anatomy. Carotid stenosis measurements (when applicable) are obtained utilizing NASCET criteria, using the distal internal carotid diameter as the denominator. CONTRAST:  43mL OMNIPAQUE IOHEXOL 350 MG/ML SOLN COMPARISON:  Carotid Doppler 07/05/2019 FINDINGS: Aortic arch: Standard branching. Imaged portion shows no evidence of aneurysm or dissection. No significant stenosis of the major arch vessel origins. Mild atherosclerotic calcification aortic arch. Right carotid system: Calcified and noncalcified plaque right carotid bifurcation. Minimal luminal diameter on the sagittal view is 0.86 mm corresponding to 78% diameter stenosis. No intraluminal filling defect. Remainder of the right internal carotid artery is patent. Left carotid system: Mild atherosclerotic disease left carotid bifurcation without stenosis. Vertebral arteries: Both vertebral arteries patent to the basilar. Mild stenosis proximal right vertebral artery. No significant left vertebral stenosis. Skeleton: Cervical spondylosis diffusely. No focal or acute skeletal abnormality. Other neck: Negative for mass or adenopathy in the neck. Upper chest: Lung apices clear bilaterally. IMPRESSION: 1. 78% diameter stenosis proximal right internal carotid artery due to calcified and noncalcified atherosclerotic plaque. 2. No significant left carotid stenosis 3. Mild stenosis origin  of right vertebral artery. Left vertebral artery widely patent. Electronically Signed   By: Franchot Gallo M.D.   On: 07/06/2019 11:36   ECHOCARDIOGRAM COMPLETE BUBBLE STUDY  Result Date: 07/06/2019    ECHOCARDIOGRAM REPORT   Patient Name:   Russell Herman Date of Exam: 07/06/2019 Medical Rec #:  [id number]       Height:       66.0 in Accession #:    [id number]      Weight:       158.7 lb Date of Birth:  Aug 09, 1945        BSA:          1.813 m Patient Age:    74 years        BP:           129/74 mmHg Patient Gender: M               HR:           73 bpm. Exam Location:  [facility] Procedure: 2D Echo, Cardiac Doppler, Color Doppler and Saline Contrast Bubble            Study Indications:     Stroke 434.91  History:         Patient has no prior history of Echocardiogram examinations.                  Risk Factors:Hypertension.  Sonographer:     Sherrie Sport RDCS (AE) Referring Phys:  [id number] LESLIE REYNOLDS Diagnosing Phys: Kate Sable MD  Sonographer Comments: Technically challenging study due to limited acoustic windows. The best view is from the subcostal. IMPRESSIONS  1. Left ventricular ejection fraction, by estimation, is 65 to 70%. The left ventricle has normal function. The left ventricle has no regional wall motion abnormalities. Left ventricular diastolic parameters are consistent with Grade I diastolic dysfunction (  impaired relaxation).  2. Right ventricular systolic function is normal. The right ventricular size is normal. There is normal pulmonary artery systolic pressure.  3. The mitral valve is normal in structure. No evidence of mitral valve regurgitation. No evidence of mitral stenosis.  4. The aortic valve is normal in structure. Aortic valve regurgitation is not visualized. No aortic stenosis is present.  5. The inferior vena cava is normal in size with greater than 50% respiratory variability, suggesting right atrial pressure of 3 mmHg.  6. Agitated saline contrast bubble study was negative, with no  evidence of any interatrial shunt. FINDINGS  Left Ventricle: Left ventricular ejection fraction, by estimation, is 65 to 70%. The left ventricle has normal function. The left ventricle has no regional wall motion abnormalities. The left ventricular internal cavity size was normal in size. There is  no left ventricular hypertrophy. Left ventricular diastolic parameters are consistent with Grade I diastolic dysfunction (impaired relaxation). Right Ventricle: The right ventricular size is normal. No increase in right ventricular wall thickness. Right ventricular systolic function is normal. There is normal pulmonary artery systolic pressure. The tricuspid regurgitant velocity is 2.44 m/s, and  with an assumed right atrial pressure of 3 mmHg, the estimated right ventricular systolic pressure is 16.0 mmHg. Left Atrium: Left atrial size was normal in size. Right Atrium: Right atrial size was normal in size. Pericardium: There is no evidence of pericardial effusion. Mitral Valve: The mitral valve is normal in structure. Normal mobility of the mitral valve leaflets. No evidence of mitral valve regurgitation. No evidence of mitral valve stenosis. Tricuspid Valve: The tricuspid valve is normal in structure. Tricuspid valve regurgitation is trivial. No evidence of tricuspid stenosis. Aortic Valve: The aortic valve is normal in structure. Aortic valve regurgitation is not visualized. No aortic stenosis is present. Aortic valve mean gradient measures 3.5 mmHg. Aortic valve peak gradient measures 5.5 mmHg. Aortic valve area, by VTI measures 2.92 cm. Pulmonic Valve: The pulmonic valve was not well visualized. Pulmonic valve regurgitation is not visualized. No evidence of pulmonic stenosis. Aorta: The aortic root is normal in size and structure. Venous: The inferior vena cava is normal in size with greater than 50% respiratory variability, suggesting right atrial pressure of 3 mmHg. IAS/Shunts: No atrial level shunt detected by  color flow Doppler. Agitated saline contrast was given intravenously to evaluate for intracardiac shunting. Agitated saline contrast bubble study was negative, with no evidence of any interatrial shunt.  LEFT VENTRICLE PLAX 2D LVIDd:         4.24 cm  Diastology LVIDs:         2.13 cm  LV e' lateral:   8.38 cm/s LV PW:         0.78 cm  LV E/e' lateral: 7.7 LV IVS:        0.74 cm  LV e' medial:    6.85 cm/s LVOT diam:     2.20 cm  LV E/e' medial:  9.4 LV SV:         72 LV SV Index:   40 LVOT Area:     3.80 cm  RIGHT VENTRICLE RV Basal diam:  2.61 cm RV S prime:     13.60 cm/s TAPSE (M-mode): 3.7 cm LEFT ATRIUM             Index       RIGHT ATRIUM           Index LA diam:        2.60 cm  1.43 cm/m  RA Area:     12.40 cm LA Vol (A2C):   26.6 ml 14.68 ml/m RA Volume:   24.70 ml  13.63 ml/m LA Vol (A4C):   22.4 ml 12.36 ml/m LA Biplane Vol: 25.2 ml 13.90 ml/m  AORTIC VALVE                   PULMONIC VALVE AV Area (Vmax):    3.06 cm    PV Vmax:        0.78 m/s AV Area (Vmean):   3.01 cm    PV Peak grad:   2.5 mmHg AV Area (VTI):     2.92 cm    RVOT Peak grad: 6 mmHg AV Vmax:           117.50 cm/s AV Vmean:          86.600 cm/s AV VTI:            0.248 m AV Peak Grad:      5.5 mmHg AV Mean Grad:      3.5 mmHg LVOT Vmax:         94.60 cm/s LVOT Vmean:        68.600 cm/s LVOT VTI:          0.190 m LVOT/AV VTI ratio: 0.77  AORTA Ao Root diam: 3.30 cm MITRAL VALVE               TRICUSPID VALVE MV Area (PHT): 4.71 cm    TR Peak grad:   23.8 mmHg MV Decel Time: 161 msec    TR Vmax:        244.00 cm/s MV E velocity: 64.30 cm/s MV A velocity: 86.50 cm/s  SHUNTS MV E/A ratio:  0.74        Systemic VTI:  0.19 m                            Systemic Diam: 2.20 cm Kate Sable MD Electronically signed by Kate Sable MD Signature Date/Time: 07/06/2019/1:46:56 PM    Final    Recent Labs    07/08/19 0633  WBC 9.9  HGB 15.7  HCT 44.5  PLT 253   Recent Labs    07/08/19 0633  NA 138  K 4.1  CL 103  CO2 23   GLUCOSE 105*  BUN 21  CREATININE 1.20  CALCIUM 9.4    Intake/Output Summary (Last 24 hours) at 07/08/2019 0858 Last data filed at 07/08/2019 0754 Gross per 24 hour  Intake 240 ml  Output 400 ml  Net -160 ml     Physical Exam: Vital Signs Blood pressure 127/81, pulse 76, temperature 98.2 F (36.8 C), temperature source Oral, resp. rate 16, height 5\' 6"  (1.676 m), weight 72.6 kg, SpO2 97 %.  General: No acute distress Mood and affect are appropriate Heart: Regular rate and rhythm no rubs murmurs or extra sounds Lungs: Clear to auscultation, breathing unlabored, no rales or wheezes Abdomen: Positive bowel sounds, soft nontender to palpation, nondistended Extremities: No clubbing, cyanosis, or edema Skin: No evidence of breakdown, no evidence of rash Neurologic: Cranial nerves II through XII intact, motor strength is 5/5 in bilateral deltoid, bicep, tricep, grip, hip flexor, knee extensors, ankle dorsiflexor and plantar flexor Sensory exam normal sensation to light touch and proprioception in right upper and lower extremities, reduced sensation LLE LT and Proprio  Cerebellar exam dysmetria L FNF and L H-> S  Musculoskeletal: Full range of motion in all 4 extremities. No joint swelling     Assessment/Plan: 1. Functional deficits secondary to Right thalamic which require 3+ hours per day of interdisciplinary therapy in a comprehensive inpatient rehab setting.  Physiatrist is providing close team supervision and 24 hour management of active medical problems listed below.  Physiatrist and rehab team continue to assess barriers to discharge/monitor patient progress toward functional and medical goals  Care Tool:  Bathing              Bathing assist       Upper Body Dressing/Undressing Upper body dressing   What is the patient wearing?: Hospital gown only    Upper body assist Assist Level: Minimal Assistance - Patient > 75%    Lower Body Dressing/Undressing Lower body  dressing      What is the patient wearing?: Underwear/pull up     Lower body assist Assist for lower body dressing: Minimal Assistance - Patient > 75%     Toileting Toileting    Toileting assist Assist for toileting: Minimal Assistance - Patient > 75%     Transfers Chair/bed transfer  Transfers assist     Chair/bed transfer assist level: Minimal Assistance - Patient > 75%     Locomotion Ambulation   Ambulation assist              Walk 10 feet activity   Assist           Walk 50 feet activity   Assist           Walk 150 feet activity   Assist           Walk 10 feet on uneven surface  activity   Assist           Wheelchair     Assist               Wheelchair 50 feet with 2 turns activity    Assist            Wheelchair 150 feet activity     Assist          Blood pressure 127/81, pulse 76, temperature 98.2 F (36.8 C), temperature source Oral, resp. rate 16, height 5\' 6"  (1.676 m), weight 72.6 kg, SpO2 97 %.  Medical Problem List and Plan: 1.  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  Antithrombotics: -DVT/anticoagulation:  Pharmaceutical: Lovenox             -antiplatelet therapy: DAPT 3. Pain Management: Tylenol prn.  4. Mood: LCSW to follow for evaluation and support.              -antipsychotic agents: N/A 5. Neuropsych: This patient is capable of making decisions on his own behalf. 6. Skin/Wound Care:  Routine pressure relief measures.  7. Fluids/Electrolytes/Nutrition: Monitor I/O. Check lytes in am.  BMET nl 6/4 8. R-CAS 70%: To follow up with Dr.  Charlyn Minerva. HTN: Monitor BP tid--meds have been on hold. Avoid hypotension to allow for adequate perfusion. Will resume  Vitals:   07/07/19 1930  BP: 127/81  Pulse: 76  Resp: 16  Temp: 98.2 F  (36.8 C)  SpO2: 97%  controlled 6/4  10. Dyslipidemia: LDL-131. Now on Crestor.  11. Hypokalemia: Supplemented X 2 on 6/2-->recheck labs in am.  12. H/o GERD/esophagitis: Continue protonix- used omeprazole at home, but needs Protonix since doesn't interact with Plavix.     LOS: 1 days A FACE TO [place] E Chantry Headen 07/08/2019, 8:58 AM

## 2019-07-08 NOTE — Progress Notes (Signed)
Occupational Therapy Session Note  Patient Details  Name: Russell Herman MRN: [id number] Date of Birth: 09-01-1945  Today's Date: 07/08/2019 OT Individual Time: 5488-4573 OT Individual Time Calculation (min): 72 min    Short Term Goals: Week 1:  OT Short Term Goal 1 (Week 1): STGs=LTGs due to ELOS  Skilled Therapeutic Interventions/Progress Updates:    Pt greeted at time of session seated up in recliner agreeable to OT session to focus on GM/FMC, tub transfers, and standing balance. Pt taken to the gym and performed FMC/GMC activities for his LUE requiring crossing midline and reaching for clips with L hand to improve coordination and accuracy for item placement with translation for ADL tasks. Pt noted to have decreased [place] and difficulty aiming for targets with LUE. Weight bearing through LUE on table top in standing with Min A for standing balance in order to improve awareness and provide sensory input. [place] and in hand manipulation putty exercises to improve functional use of LUE with putty left in room for future use. Note that therapist encouraged attention to L side throughout all tasks to improve attention to L side. Pt brought to ADL suite and transferred to TTB with Min A for hand held assist, able to lift legs over tub wall, agreeable to TTB at home. Pt returned to room and needed to use the bathroom, ambulated to bathroom with CGA-Min A with RW, able to perform donning/doffing clothes with CGA and transfer to the toilet with Min A. Pt ambulated to recliner CGA-Min with RW and left with call bell within reach, alarm on, and all needs met.   Therapy Documentation Precautions:  Precautions Precautions: Fall Precaution Comments: Mild Lt hemi Restrictions Weight Bearing Restrictions: No    Therapy/Group: Individual Therapy  Viona Gilmore 07/08/2019, 4:00 PM

## 2019-07-08 NOTE — Progress Notes (Signed)
Inpatient [facility] Individual Statement of Services  Patient Name:  Russell Herman  Date:  07/08/2019  Welcome to the [facility].  Our goal is to provide you with an individualized program based on your diagnosis and situation, designed to meet your specific needs.  With this comprehensive rehabilitation program, you will be expected to participate in at least 3 hours of rehabilitation therapies Monday-Friday, with modified therapy programming on the weekends.  Your rehabilitation program will include the following services:  Physical Therapy (PT), Occupational Therapy (OT), Speech Therapy (ST), 24 hour per day rehabilitation nursing, Therapeutic Recreaction (TR), Neuropsychology, Care Coordinator, Rehabilitation Medicine, Nutrition Services and Pharmacy Services  Weekly team conferences will be held on Wednesdays to discuss your progress.  Your Inpatient Rehabilitation Care Coordinator will talk with you frequently to get your input and to update you on team discussions.  Team conferences with you and your family in attendance may also be held.  Expected length of stay: 6-9 Days  Overall anticipated outcome: MOD I  Depending on your progress and recovery, your program may change. Your Inpatient Rehabilitation Care Coordinator will coordinate services and will keep you informed of any changes. Your Inpatient Rehabilitation Care Coordinator's name and contact numbers are listed  below.  The following services may also be recommended but are not provided by the [facility]:    [place] will be made to provide these services after discharge if needed.  Arrangements include referral to agencies that provide these services.  Your insurance has been verified to be:  Medicare Your primary doctor is:  Olmedo, Guy Begin, MD  Pertinent information will be shared  with your doctor and your insurance company.  Inpatient Rehabilitation Care Coordinator:  Erlene Quan, [place] or [phone]  Information discussed with and copy given to patient by: Dyanne Iha, 07/08/2019, 10:25 AM

## 2019-07-08 NOTE — Progress Notes (Signed)
Inpatient Rehabilitation  Patient information reviewed and entered into eRehab system by Sedrick Tober M. Shaun Runyon, M.A., CCC/SLP, PPS Coordinator.  Information including medical coding, functional ability and quality indicators will be reviewed and updated through discharge.    

## 2019-07-08 NOTE — Plan of Care (Signed)
  Problem: RH Balance Goal: LTG Patient will maintain dynamic standing with ADLs (OT) Description: LTG:  Patient will maintain dynamic standing balance with assist during activities of daily living (OT)  Flowsheets (Taken 07/08/2019 1143) LTG: Pt will maintain dynamic standing balance during ADLs with: Independent with assistive device   Problem: Sit to Stand Goal: LTG:  Patient will perform sit to stand in prep for activites of daily living with assistance level (OT) Description: LTG:  Patient will perform sit to stand in prep for activites of daily living with assistance level (OT) Flowsheets (Taken 07/08/2019 1143) LTG: PT will perform sit to stand in prep for activites of daily living with assistance level: Independent with assistive device   Problem: RH Grooming Goal: LTG Patient will perform grooming w/assist,cues/equip (OT) Description: LTG: Patient will perform grooming with assist, with/without cues using equipment (OT) Flowsheets (Taken 07/08/2019 1143) LTG: Pt will perform grooming with assistance level of: Independent with assistive device    Problem: RH Bathing Goal: LTG Patient will bathe all body parts with assist levels (OT) Description: LTG: Patient will bathe all body parts with assist levels (OT) Flowsheets (Taken 07/08/2019 1143) LTG: Pt will perform bathing with assistance level/cueing: Set up assist    Problem: RH Dressing Goal: LTG Patient will perform upper body dressing (OT) Description: LTG Patient will perform upper body dressing with assist, with/without cues (OT). Flowsheets (Taken 07/08/2019 1143) LTG: Pt will perform upper body dressing with assistance level of: Independent with assistive device Goal: LTG Patient will perform lower body dressing w/assist (OT) Description: LTG: Patient will perform lower body dressing with assist, with/without cues in positioning using equipment (OT) Flowsheets (Taken 07/08/2019 1143) LTG: Pt will perform lower body dressing with  assistance level of: Independent with assistive device   Problem: RH Toileting Goal: LTG Patient will perform toileting task (3/3 steps) with assistance level (OT) Description: LTG: Patient will perform toileting task (3/3 steps) with assistance level (OT)  Flowsheets (Taken 07/08/2019 1143) LTG: Pt will perform toileting task (3/3 steps) with assistance level: Independent with assistive device   Problem: RH Toilet Transfers Goal: LTG Patient will perform toilet transfers w/assist (OT) Description: LTG: Patient will perform toilet transfers with assist, with/without cues using equipment (OT) Flowsheets (Taken 07/08/2019 1143) LTG: Pt will perform toilet transfers with assistance level of: Independent with assistive device   Problem: RH Tub/Shower Transfers Goal: LTG Patient will perform tub/shower transfers w/assist (OT) Description: LTG: Patient will perform tub/shower transfers with assist, with/without cues using equipment (OT) Flowsheets (Taken 07/08/2019 1143) LTG: Pt will perform tub/shower stall transfers with assistance level of: Set up assist

## 2019-07-08 NOTE — Evaluation (Signed)
Physical Therapy Assessment and Plan  Patient Details  Name: Russell Herman MRN: [id number] Date of Birth: 02-Apr-1945  PT Diagnosis: Abnormal posture, Abnormality of gait, Ataxia, Ataxic gait, Coordination disorder, Hemiplegia non-dominant, Impaired sensation and Muscle weakness Rehab Potential: Good ELOS: 7-10 days   Today's Date: 07/08/2019 PT Individual Time: 1300-1400 PT Individual Time Calculation (min): 60 min    Problem List:  Patient Active Problem List   Diagnosis Date Noted  . Stroke ([place]) 07/06/2019  . TIA (transient ischemic attack) 07/05/2019  . Essential hypertension 07/05/2019  . Hypokalemia 07/05/2019  . Adenomatous colon polyp 01/31/2014  . Basal cell carcinoma 09/23/2013  . Hay fever 09/23/2013  . Vitamin D deficiency 09/23/2013  . Gastric catarrh 11/28/2011  . Hypercholesteremia 11/28/2011  . Laboratory examination 11/28/2011    Past Medical History:  Past Medical History:  Diagnosis Date  . Adenomatous colon polyp   . Allergy   . Arthritis   . Cancer ([place])   . GERD (gastroesophageal reflux disease)   . Hypertension   . Wears hearing aid in both ears    Past Surgical History:  Past Surgical History:  Procedure Laterality Date  . cyst removed    . EXCISION OF TONGUE LESION Bilateral 05/05/2019   Procedure: EXCISION OF DORSAL TONGUE LESION;  Surgeon: Margaretha Sheffield, MD;  Location: [place];  Service: ENT;  Laterality: Bilateral;  . skin cancer removed      Assessment & Plan Clinical Impression: Patient is a22 year old RH-male with history of HTN, GERD, BCC, allergies who was admitted to [facility] on 07/05/19 with reports of waking up with left sided weakness, numbness and had difficulty walking. CT head negative for acute changes. MRI brain revealed small acute infarct in right lateral thalamus and mild small vessel disease. CTA head/neck showed 78% stenosis proximal R-ICA due to calcified and non-calcified plaque and mild stenosis origin of R-VA.  Dr. Doy Mince  recommended DAPT X 3 weeks followed by ASA alone. Stroke felt to be due to small vessel disease.   2D echo showed EF 65-70% with TVR and bubble study negative. Dr. Lucky Cowboy consulted for input and plans on surgical intervention on outpatient basis after clearance from Cards. Therapy ongoing and patient limited by left sided weakness with left knee instability and decrease in coordination LUE.  Patient transferred to CIR on 07/07/2019 .   Patient currently requires min with mobility secondary to muscle weakness and muscle joint tightness, decreased cardiorespiratoy endurance, ataxia and decreased coordination, decreased attention to left and decreased standing balance, decreased postural control, hemiplegia and decreased balance strategies.  Prior to hospitalization, patient was independent  with mobility and lived with Spouse(Russell Herman) in a House home.  Home access is  Ramped entrance.  Patient will benefit from skilled PT intervention to maximize safe functional mobility, minimize fall risk and decrease caregiver burden for planned discharge home with intermittent assist.  Anticipate patient will benefit from follow up [facility] at discharge.  PT - End of Session Activity Tolerance: Tolerates 10 - 20 min activity with multiple rests Endurance Deficit: Yes PT Assessment Rehab Potential (ACUTE/IP ONLY): Good PT Barriers to Discharge: Decreased caregiver support;Home environment access/layout PT Patient demonstrates impairments in the following area(s): Balance;Endurance;Motor;Perception;Safety;Sensory PT Transfers Functional Problem(s): Bed Mobility;Bed to Chair;Car;Furniture;Floor PT Locomotion Functional Problem(s): Ambulation;Wheelchair Mobility;Stairs PT Plan PT Intensity: Minimum of 1-2 x/day ,45 to 90 minutes PT Frequency: 5 out of 7 days PT Duration Estimated Length of Stay: 7-10 days PT Treatment/Interventions: Ambulation/gait training;Community reintegration;DME/adaptive equipment  instruction;Neuromuscular  re-education;Psychosocial support;Stair training;UE/LE Strength taining/ROM;Wheelchair propulsion/positioning;UE/LE Coordination activities;Therapeutic Activities;Skin care/wound management;Functional electrical stimulation;Discharge planning;Balance/vestibular training;Cognitive remediation/compensation;Disease management/prevention;Functional mobility training;Patient/family education;Splinting/orthotics;Visual/perceptual remediation/compensation;Pain management PT Transfers Anticipated Outcome(s): Mod I with LRAD PT Locomotion Anticipated Outcome(s): Mod I with LRAD at ambulatory level PT Recommendation Follow Up Recommendations: Home health PT Patient destination: Home Equipment Recommended: To be determined;Rolling walker with 5" wheels  Skilled Therapeutic Intervention Pt received sitting in WC and agreeable to PT. PT instructed patient in PT Evaluation and initiated treatment intervention; see below for results. PT educated patient in [place], rehab potential, rehab goals, and discharge recommendations. Gait training without AD x 16f and min assist as listed below. Stair management training with mod assist and no UE support x 2 as listed. Also performed stairs with BUE support and min assist for safety and cues for step to pattern. Car transfer with min assist and no AD. Patient demonstrates increased fall risk as noted by score of   37/56 on Berg Balance Scale.  (<36= high risk for falls, close to 100%; 37-45 significant >80%; 46-51 moderate >50%; 52-55 lower >25%) [facility] returned to [facility] and [facility] and all needs met.       PT Evaluation Precautions/Restrictions   fall ataxia  General   Vital SignsTherapy Vitals Temp: 97.9 F (36.6 C) Temp Source: Oral Pulse Rate: 80 Resp: 16 BP: 116/73 Patient Position (if appropriate): Sitting Oxygen Therapy SpO2: 98 % O2 Device: Room Air Pain   denies Home Living/Prior  Functioning Home Living Available Help at Discharge: Available 24 hours/day;Family Type of Home: House Home Access: Ramped entrance Home Layout: Able to live on main level with bedroom/bathroom;Two level Bathroom Shower/Tub: TChiropodist Handicapped height Bathroom Accessibility: Yes  Lives With: Spouse(Russell Herman) Prior Function Level of Independence: Independent with basic ADLs;Independent with homemaking with ambulation Driving: Yes Comments: Pt was active and independent PTA reports using a wood splitter last week, walks 1 mile 3x/ week. Does not use AE for ADL/Functional mobility. Vision/Perception  Perception Perception: Impaired Inattention/Neglect: Does not attend to left visual field Praxis Praxis: Intact  Cognition Overall Cognitive Status: Within Functional Limits for tasks assessed Arousal/Alertness: Awake/alert Orientation Level: Oriented X4 Attention: Sustained Sustained Attention: Appears intact Memory: Appears intact Safety/Judgment: Appears intact Sensation Sensation Peripheral sensation comments: mild distal impairments Coordination Gross Motor Movements are Fluid and Coordinated: No Fine Motor Movements are Fluid and Coordinated: No Coordination and Movement Description: Mild Lt hemi with strength and coordination deficits in the UE Finger Nose Finger Test: ataxia Lt UE, WNL Rt UE Heel Shin Test: ataxia on the L Motor  Motor Motor: Hemiplegia;Ataxia Motor - Skilled Clinical Observations: Mild Lt hemiplegia  Mobility Bed Mobility Bed Mobility: Rolling Right;Rolling Left;Sit to Supine;Supine to Sit Rolling Right: Supervision/verbal cueing Rolling Left: Supervision/Verbal cueing Supine to Sit: Supervision/Verbal cueing Sit to Supine: Supervision/Verbal cueing Transfers Transfers: Sit to Stand;Stand Pivot Transfers Sit to Stand: Minimal Assistance - Patient > 75% Stand Pivot Transfers: Minimal Assistance - Patient > 75% Transfer  (Assistive device): None Locomotion  Gait Ambulation: Yes Gait Assistance: Minimal Assistance - Patient > 75% Gait Distance (Feet): 150 Feet Assistive device: None;1 person hand held assist Gait Assistance Details: Manual facilitation for weight shifting;Verbal cues for sequencing;Verbal cues for gait pattern Gait Gait: Yes Gait Pattern: Impaired Gait Pattern: Ataxic;Lateral hip instability Stairs / Additional Locomotion Stairs: Yes Stairs Assistance: Moderate Assistance - Patient 50 - 74% Stair Management Technique: No rails Number of Stairs: 2 Height of Stairs: 8  Wheelchair Mobility Wheelchair Mobility: No  Trunk/Postural Assessment  Cervical Assessment Cervical Assessment: Within Functional Limits Thoracic Assessment Thoracic Assessment: Within Functional Limits Lumbar Assessment Lumbar Assessment: Within Functional Limits Postural Control Postural Control: Deficits on evaluation(Mild Rt lean in standing, mild posterior bias with 1 posterior LOB in the shower)  Balance Balance Balance Assessed: Yes Standardized Balance Assessment Standardized Balance Assessment: Berg Balance Test Berg Balance Test Sit to Stand: Able to stand  independently using hands Standing Unsupported: Able to stand 2 minutes with supervision Sitting with Back Unsupported but Feet Supported on Floor or Stool: Able to sit safely and securely 2 minutes Stand to Sit: Controls descent by using hands Transfers: Able to transfer safely, definite need of hands Standing Unsupported with Eyes Closed: Able to stand 10 seconds with supervision Standing Ubsupported with Feet Together: Able to place feet together independently but unable to hold for 30 seconds From Standing, Reach Forward with Outstretched Arm: Can reach confidently >25 cm (10") From Standing Position, Pick up Object from Floor: Able to pick up shoe, needs supervision From Standing Position, Turn to Look Behind Over each Shoulder: Looks behind  one side only/other side shows less weight shift Turn 360 Degrees: Needs close supervision or verbal cueing Standing Unsupported, Alternately Place Feet on Step/Stool: Able to complete >2 steps/needs minimal assist Standing Unsupported, One Foot in Front: Able to plae foot ahead of the other independently and hold 30 seconds Standing on One Leg: Tries to lift leg/unable to hold 3 seconds but remains standing independently Total Score: 37 Static Sitting Balance Static Sitting - Balance Support: No upper extremity supported Static Sitting - Level of Assistance: 7: Independent Dynamic Sitting Balance Dynamic Sitting - Balance Support: No upper extremity supported Dynamic Sitting - Level of Assistance: 5: Stand by assistance Static Standing Balance Static Standing - Balance Support: No upper extremity supported Static Standing - Level of Assistance: 5: Stand by assistance Dynamic Standing Balance Dynamic Standing - Balance Support: No upper extremity supported Dynamic Standing - Level of Assistance: 4: Min assist Extremity Assessment      RLE Assessment RLE Assessment: Within Functional Limits LLE Assessment LLE Assessment: Exceptions to [facility] Active Range of Motion (AROM) Comments: decreased coordination and delayed activation General Strength Comments: 5/5    Refer to Care Plan for Long Term Goals  Recommendations for other services: Therapeutic Recreation  Kitchen group, Stress management and Outing/community reintegration  Discharge Criteria: Patient will be discharged from PT if patient refuses treatment 3 consecutive times without medical reason, if treatment goals not met, if there is a change in medical status, if patient makes no progress towards goals or if patient is discharged from hospital.  The above assessment, treatment plan, treatment alternatives and goals were discussed and mutually agreed upon: by patient  Lorie Phenix 07/08/2019, 6:02 PM

## 2019-07-09 ENCOUNTER — Inpatient Hospital Stay (HOSPITAL_COMMUNITY): Payer: Medicare Other | Admitting: Occupational Therapy

## 2019-07-09 ENCOUNTER — Inpatient Hospital Stay (HOSPITAL_COMMUNITY): Payer: Medicare Other | Admitting: Physical Therapy

## 2019-07-09 DIAGNOSIS — E876 Hypokalemia: Secondary | ICD-10-CM

## 2019-07-09 DIAGNOSIS — R944 Abnormal results of kidney function studies: Secondary | ICD-10-CM

## 2019-07-09 DIAGNOSIS — E785 Hyperlipidemia, unspecified: Secondary | ICD-10-CM

## 2019-07-09 NOTE — Progress Notes (Signed)
[place] PHYSICAL MEDICINE & REHABILITATION PROGRESS NOTE   Subjective/Complaints: Patient seen sitting up in his chair this morning, working with therapies.  He states he slept well overnight.  He states a good first day of therapies yesterday.  He has questions regarding discharge medications, explained to patient that we will discuss all of these things in more detail closer to hospital discharge.  ROS: Denies CP, SOB, N/V/D  Objective:   No results found. Recent Labs    07/08/19 0633  WBC 9.9  HGB 15.7  HCT 44.5  PLT 253   Recent Labs    07/08/19 0633  NA 138  K 4.1  CL 103  CO2 23  GLUCOSE 105*  BUN 21  CREATININE 1.20  CALCIUM 9.4    Intake/Output Summary (Last 24 hours) at 07/09/2019 1047 Last data filed at 07/09/2019 0500 Gross per 24 hour  Intake 240 ml  Output 250 ml  Net -10 ml     Physical Exam: Vital Signs Blood pressure 105/66, pulse 64, temperature 97.9 F (36.6 C), temperature source Oral, resp. rate 16, height 5\' 6"  (1.676 m), weight 72.6 kg, SpO2 91 %. Constitutional: No distress . Vital signs reviewed. HENT: Normocephalic.  Atraumatic. Eyes: EOMI. No discharge. Cardiovascular: No JVD.  RRR. Respiratory: Normal effort.  No stridor.  Bilaterally clear to auscultation. GI: Non-distended. Skin: Warm and dry.  Intact. Psych: Normal mood.  Normal behavior. Musc: No edema in extremities.  No tenderness in extremities. Neurologic: Alert Motor: 5/5 throughout  Assessment/Plan: 1. Functional deficits secondary to Right thalamic which require 3+ hours per day of interdisciplinary therapy in a comprehensive inpatient rehab setting.  Physiatrist is providing close team supervision and 24 hour management of active medical problems listed below.  Physiatrist and rehab team continue to assess barriers to discharge/monitor patient progress toward functional and medical goals  Care Tool:  Bathing    Body parts bathed by patient: Right arm, Left arm,  Chest, Abdomen, Front perineal area, Buttocks, Right upper leg, Left upper leg, Right lower leg, Left lower leg, Face         Bathing assist Assist Level: Contact Guard/Touching assist     Upper Body Dressing/Undressing Upper body dressing   What is the patient wearing?: Pull over shirt    Upper body assist Assist Level: Set up assist    Lower Body Dressing/Undressing Lower body dressing      What is the patient wearing?: Underwear/pull up, Pants     Lower body assist Assist for lower body dressing: Contact Guard/Touching assist     Toileting Toileting Toileting Activity did not occur (Clothing management and hygiene only): N/A (no void or bm)  Toileting assist Assist for toileting: Supervision/Verbal cueing Assistive Device Comment: URINAL   Transfers Chair/bed transfer  Transfers assist     Chair/bed transfer assist level: Contact Guard/Touching assist     Locomotion Ambulation   Ambulation assist      Assist level: Minimal Assistance - Patient > 75% Assistive device: Hand held assist Max distance: 150   Walk 10 feet activity   Assist     Assist level: Minimal Assistance - Patient > 75% Assistive device: Hand held assist   Walk 50 feet activity   Assist    Assist level: Minimal Assistance - Patient > 75% Assistive device: Hand held assist    Walk 150 feet activity   Assist    Assist level: Minimal Assistance - Patient > 75% Assistive device: Hand held assist  Walk 10 feet on uneven surface  activity   Assist     Assist level: Moderate Assistance - Patient - 50 - 74% Assistive device: Hand held assist   Wheelchair     Assist Will patient use wheelchair at discharge?: No   Wheelchair activity did not occur: N/A         Wheelchair 50 feet with 2 turns activity    Assist    Wheelchair 50 feet with 2 turns activity did not occur: N/A       Wheelchair 150 feet activity     Assist  Wheelchair 150 feet  activity did not occur: N/A       Blood pressure 105/66, pulse 64, temperature 97.9 F (36.6 C), temperature source Oral, resp. rate 16, height 5\' 6"  (1.676 m), weight 72.6 kg, SpO2 91 %.  Medical Problem List and Plan: 1.  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  Antithrombotics: -DVT/anticoagulation:  Pharmaceutical: Lovenox             -antiplatelet therapy: DAPT 3. Pain Management: Tylenol prn.  4. Mood: LCSW to follow for evaluation and support.              -antipsychotic agents: N/A 5. Neuropsych: This patient is capable of making decisions on his own behalf. 6. Skin/Wound Care:  Routine pressure relief measures.  7. Fluids/Electrolytes/Nutrition: Monitor I/O.  8. 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 HTN: Monitor BP   On no anti-hypertensives at present Vitals:   07/08/19 2058 07/09/19 0451  BP: 109/68 105/66  Pulse: 70 64  Resp: 16 16  Temp: 98.3 F (36.8 C) 97.9 F (36.6 C)  SpO2: 97% 91%   Controlled on 6/5 10. Dyslipidemia: LDL-131. Now on Crestor.  11. Hypokalemia: Supplemented X 2 on 6/2  Potassium 4.1 on 6/4, labs ordered for Monday 12. H/o GERD/esophagitis: Continue protonix- used omeprazole at home, but needs Protonix since doesn't interact with Plavix.  35.  AKI versus CKD  Labs ordered for Monday   LOS: 2 days A FACE TO FACE EVALUATION WAS PERFORMED  Jasman Pfeifle Lorie Phenix 07/09/2019, 10:47 AM

## 2019-07-09 NOTE — Progress Notes (Signed)
Occupational Therapy Session Note  Patient Details  Name: Russell Herman MRN: [id number] Date of Birth: 12-Apr-1945  Today's Date: 07/09/2019 OT Individual Time: 0705-0800 OT Individual Time Calculation (min): 55 min    Short Term Goals: Week 1:  OT Short Term Goal 1 (Week 1): STGs=LTGs due to ELOS  Skilled Therapeutic Interventions/Progress Updates:    Upon entering the room, pt supine in bed and just finished breakfast. Pt with no c/o pain and agreeable to OT intervention. Pt performed bed mobility with supervision and picking out clothing items while seated on EOB. Pt ambulating to bathroom without AD and needing min A for balance to toileting. Pt able to void and doffs clothing items while seated on commode. Stand pivot transfer onto TTB with min A overall and use of RW. Pt bathing while seated on bench and standing with use of grab bar to wash buttocks with CGA for safety. Pt exiting the bathroom and seated on EOB for dressing tasks with use of RW at supervision level. UB self care with set up A and LB self care with CGA for balance. Pt sitting at sink to shave self with set up A and then requesting to return to bathroom for BM. Pt performed hygiene and clothing management with close supervision and use of RW. Pt seated in recliner chair with call bell and all needed items within reach upon exiting the room.   Therapy Documentation Precautions:  Precautions Precautions: Fall Precaution Comments: Mild Lt hemi Restrictions Weight Bearing Restrictions: No General:   Vital Signs: Therapy Vitals Temp: 97.9 F (36.6 C) Temp Source: Oral Pulse Rate: 64 Resp: 16 BP: 105/66 Patient Position (if appropriate): Lying Oxygen Therapy SpO2: 91 % O2 Device: Room Air Pain:   ADL: ADL Eating: Not assessed Grooming: Contact guard Where Assessed-Grooming: Standing at sink Upper Body Bathing: Setup Where Assessed-Upper Body Bathing: Shower Lower Body Bathing: Contact guard Where  Assessed-Lower Body Bathing: Shower Upper Body Dressing: Setup Where Assessed-Upper Body Dressing: Other (Comment)(sitting on standard toilet) Lower Body Dressing: Contact guard Where Assessed-Lower Body Dressing: Other (Comment)(sitting on toilet) Toileting: Not assessed Toilet Transfer: Minimal assistance Toilet Transfer Method: Ambulating(without AD) Science writer: Emergency planning/management officer Transfer: Curator Method: Ambulating(using RW) Youth worker: Radio broadcast assistant, Systems analyst    Praxis   Exercises:   Other Treatments:     Therapy/Group: Individual Therapy  Gypsy Decant 07/09/2019, 7:49 AM

## 2019-07-09 NOTE — Progress Notes (Signed)
Physical Therapy Session Note  Patient Details  Name: Russell Herman MRN: [id number] Date of Birth: 21-May-1945  Today's Date: 07/09/2019 PT Individual Time: 1405-1500 PT Individual Time Calculation (min): 55 min   Short Term Goals: Week 1:  PT Short Term Goal 1 (Week 1): STG=LTG due to ELOS  Skilled Therapeutic Interventions/Progress Updates:   Pt received sitting in WC and agreeable to PT. Gait training with RW with supervision assist throughout session x 141f, 577f 2549f; cues for step height on the R and symmetry R and L. Dynamic gait training in parallel bars forward/reverse [id number] with min assist and cues for symmetry and decreased step length on the L. Side stepping R and L in parallel bar 20ft33f with min assist and cues for decreased compensations. Gait training without AD through gym 100ft 74fft w68fmin assist overall, cues for improve pelvic rotation, symmetric step length and weight shift R to improved step length L. Dynamic balance training to build pipe tree x 2 while standing on blue wedge with min assist progressing to supervision assist and min cues for improved use of LUE to build tree. Ambulatory transfer to toilet to urinate with RW and supervision assist. Patient returned to room and performed stand pivot to recliner with supervision assist and RW. Pt left sitting in recliner with call bell in reach and all needs met.        Therapy Documentation Precautions:  Precautions Precautions: Fall Precaution Comments: Mild Lt hemi Restrictions Weight Bearing Restrictions: No    Pain: denies   Therapy/Group: Individual Therapy  [name] Lorie Phenix21, 3:01 PM

## 2019-07-09 NOTE — Progress Notes (Signed)
Occupational Therapy Session Note  Patient Details  Name: Russell Herman MRN: [id number] Date of Birth: 06/21/1945  Today's Date: 07/09/2019 OT Individual Time: 1001-1044 OT Individual Time Calculation (min): 43 min   Short Term Goals: Week 1:  OT Short Term Goal 1 (Week 1): STGs=LTGs due to ELOS  Skilled Therapeutic Interventions/Progress Updates:    Pt greeted in the recliner with no c/o pain. Started session by donning his socks and sneakers with pt able to meet demands of task, including tying his shoelaces, with setup assist. He then ambulated down to the dayroom using RW with CGA, cues for improving Lt foot clearance and forward gaze. To work on [employer] and standing balance, pt engaged in [facility] task involving precise movement, palm<finger and finger<palm translations using pennies. He stood in front of an elevated table while engaging in task, min cuing for neutral base of support due to offloading of the Lt LE. Pt rated perceived exertion as 6-7/10, required increased time due to intrinsic weakness in the Lt hand. OT provided him with a [facility] exercise program to use in the room with foam sponges. Pt appreciative. After ambulating back to his room using RW, pt completed an ambulatory toilet transfer and voided bladder. CGA for toileting tasks. He remained sitting in his recliner at close of session. Left with all needs within reach and chair alarm activated.    Therapy Documentation Precautions:  Precautions Precautions: Fall Precaution Comments: Mild Lt hemi Restrictions Weight Bearing Restrictions: No Vital Signs: Therapy Vitals Temp: 98.2 F (36.8 C) Pulse Rate: 67 Resp: 16 BP: 98/61 Patient Position (if appropriate): Sitting Oxygen Therapy SpO2: 97 % O2 Device: Room Air ADL: ADL Eating: Not assessed Grooming: Contact guard Where Assessed-Grooming: Standing at sink Upper Body Bathing: Setup Where Assessed-Upper Body Bathing: Shower Lower Body Bathing: Contact guard Where  Assessed-Lower Body Bathing: Shower Upper Body Dressing: Setup Where Assessed-Upper Body Dressing: Other (Comment)(sitting on standard toilet) Lower Body Dressing: Contact guard Where Assessed-Lower Body Dressing: Other (Comment)(sitting on toilet) Toileting: Not assessed Toilet Transfer: Minimal assistance Toilet Transfer Method: Ambulating(without AD) Science writer: Emergency planning/management officer Transfer: Curator Method: Ambulating(using RW) Youth worker: Radio broadcast assistant, Grab bars      Therapy/Group: Individual Therapy  Varina Hulon A Nevayah Faust 07/09/2019, 4:40 PM

## 2019-07-09 NOTE — Plan of Care (Signed)
°  Problem: Consults Goal: RH STROKE PATIENT EDUCATION Description: See Patient Education module for education specifics  Outcome: Progressing Goal: Nutrition Consult-if indicated Outcome: Progressing Goal: Diabetes Guidelines if Diabetic/Glucose > 140 Description: If diabetic or lab glucose is > 140 mg/dl - Initiate Diabetes/Hyperglycemia Guidelines & Document Interventions  Outcome: Progressing   Problem: RH SKIN INTEGRITY Goal: RH STG SKIN FREE OF INFECTION/BREAKDOWN Description: With supervision/cues/reminders to check affected side Outcome: Progressing Goal: RH STG MAINTAIN SKIN INTEGRITY WITH ASSISTANCE Description: STG Maintain Skin Integrity With cues/reminders -Assistance. Outcome: Progressing   Problem: RH SAFETY Goal: RH STG ADHERE TO SAFETY PRECAUTIONS W/ASSISTANCE/DEVICE Description: STG Adhere to Safety Precautions With cues/reminders/supervision-Assistance/Device. Outcome: Progressing Goal: RH STG DECREASED RISK OF FALL WITH ASSISTANCE Description: STG Decreased Risk of Fall With cues/reminders/supervision-Assistance. Outcome: Progressing   Problem: RH SAFETY Goal: RH STG ADHERE TO SAFETY PRECAUTIONS W/ASSISTANCE/DEVICE Description: STG Adhere to Safety Precautions With cues/reminders/supervision-Assistance/Device. Outcome: Progressing   Problem: RH KNOWLEDGE DEFICIT Goal: RH STG INCREASE KNOWLEDGE OF HYPERTENSION Description: Patient will be able to manage HTN and secondary stroke prevention independently using handouts and resources with cues/reminders for diet, restrictions, medications and exercise Outcome: Progressing Goal: RH STG INCREASE KNOWLEGDE OF HYPERLIPIDEMIA Description: Patient will be able to manage HLD and secondary stroke prevention independently using handouts and resources with cues/reminders for diet, fat and cholesterol restrictions, medications and exercise  Outcome: Progressing Goal: RH STG INCREASE KNOWLEDGE OF STROKE  PROPHYLAXIS Description: Patient will be able to explain secondary stroke prevention/DAPT for three months post discharge with supervision/cues/reminders to utse handouts.resources for reference of information Outcome: Progressing

## 2019-07-09 NOTE — Progress Notes (Signed)
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     Patient with decreased left motor control throughout when actively moving from flexion to extension...worked on that a bit also.   Patient left with more left hand/wrist coordination activities to do on his own and seated in his recliner.  Patient wife preparing to undergo radiation therapy.  So, more opportunities for indepedence, home skills, functional mobility and IADLs such as managing checking book and time will help prepare [name] for both physical, cognitiive and home skills post discharge  Continue OT Plan of Care     Therapy Documentation Precautions:  Precautions Precautions: Fall Precaution Comments: Mild Lt hemi Restrictions Weight Bearing Restrictions: No Pain:denied     Therapy/Group: Individual Therapy  Alfredia Ferguson [facility] 07/09/2019, 3:10 PM

## 2019-07-10 ENCOUNTER — Inpatient Hospital Stay (HOSPITAL_COMMUNITY): Payer: Medicare Other | Admitting: Occupational Therapy

## 2019-07-10 ENCOUNTER — Inpatient Hospital Stay (HOSPITAL_COMMUNITY): Payer: Medicare Other | Admitting: Physical Therapy

## 2019-07-10 DIAGNOSIS — N179 Acute kidney failure, unspecified: Secondary | ICD-10-CM

## 2019-07-10 DIAGNOSIS — I10 Essential (primary) hypertension: Secondary | ICD-10-CM

## 2019-07-10 DIAGNOSIS — I69354 Hemiplegia and hemiparesis following cerebral infarction affecting left non-dominant side: Principal | ICD-10-CM

## 2019-07-10 NOTE — Progress Notes (Signed)
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 He completed bathing (sit<stand from TTB), dressing (sit<stand from EOB, using RW as needed. He also gathered clothing beforehand), toileting (using standard toilet), oral care/shaving (standing at sink), and IADL task of bedmaking during session. Tx focus placed on Lt NMR, dynamic standing balance, d/c planning, and ADL/IADL retraining. All functional transfers completed at ambulatory level with close supervision assist using RW. Pt needs cues for RW mgt and positioning during functional tasks. Close supervision for dynamic standing balance during ADLs today. CGA for stooping to floor to clean up towels after shower and also when using both UEs to apply fitted sheets and blankets to bed. Education provided regarding side-stepping techniques, RW positioning during IADL engagement, and energy conservation techniques. Provided him with a RW bag and explained functional use at home. His spouse is having radiation therapy and it will be advantageous for him to be able to assist with IADLs at home. We also discussed reaching out to church family members for support with cleaning and meals. Also discussed DME needs and OT f/u. At end of session pt remained sitting in the recliner with all needs within reach and chair alarm set.   Therapy Documentation Precautions:  Precautions Precautions: Fall Precaution Comments: Mild Lt hemi Restrictions Weight Bearing Restrictions: No Pain: per pt, "not more than usual." He reported pain to be manageable during tx without interventions to address   ADL: ADL Eating: Not  assessed Grooming: Contact guard Where Assessed-Grooming: Standing at sink Upper Body Bathing: Setup Where Assessed-Upper Body Bathing: Shower Lower Body Bathing: Contact guard Where Assessed-Lower Body Bathing: Shower Upper Body Dressing: Setup Where Assessed-Upper Body Dressing: Other (Comment)(sitting on standard toilet) Lower Body Dressing: Contact guard Where Assessed-Lower Body Dressing: Other (Comment)(sitting on toilet) Toileting: Not assessed Toilet Transfer: Minimal assistance Toilet Transfer Method: Ambulating(without AD) Science writer: Emergency planning/management officer Transfer: Curator Method: Ambulating(using RW) Youth worker: Radio broadcast assistant, Grab bars      Therapy/Group: Individual Therapy  Laqueisha Catalina A Glennie Rodda 07/10/2019, 12:23 PM

## 2019-07-10 NOTE — Progress Notes (Addendum)
[place] PHYSICAL MEDICINE & REHABILITATION PROGRESS NOTE   Subjective/Complaints: Patient seen ambulating from the restroom with rolling walker and supervision.  He states he slept well overnight.  He believes therapies are going well.  ROS: Denies CP, SOB, N/V/D  Objective:   No results found. Recent Labs    07/08/19 0633  WBC 9.9  HGB 15.7  HCT 44.5  PLT 253   Recent Labs    07/08/19 0633  NA 138  K 4.1  CL 103  CO2 23  GLUCOSE 105*  BUN 21  CREATININE 1.20  CALCIUM 9.4    Intake/Output Summary (Last 24 hours) at 07/10/2019 1256 Last data filed at 07/10/2019 0708 Gross per 24 hour  Intake 956 ml  Output 375 ml  Net 581 ml     Physical Exam: Vital Signs Blood pressure 126/74, pulse 65, temperature 97.7 F (36.5 C), resp. rate 18, height 5\' 6"  (1.676 m), weight 72.6 kg, SpO2 97 %.  Constitutional: No distress . Vital signs reviewed. HENT: Normocephalic.  Atraumatic. Eyes: EOMI. No discharge. Cardiovascular: No JVD.  RRR. Respiratory: Normal effort.  No stridor.  Bilaterally clear to auscultation. GI: Non-distended. Skin: Warm and dry.  Intact. Psych: Normal mood.  Normal behavior. Musc: No edema in extremities.  No tenderness in extremities. Neurologic: Alert Motor: RUE/RLE: 5/5 proximal distal LUE/LLE: 4+/5 proximal distal  Assessment/Plan: 1. Functional deficits secondary to Right thalamic which require 3+ hours per day of interdisciplinary therapy in a comprehensive inpatient rehab setting.  Physiatrist is providing close team supervision and 24 hour management of active medical problems listed below.  Physiatrist and rehab team continue to assess barriers to discharge/monitor patient progress toward functional and medical goals  Care Tool:  Bathing    Body parts bathed by patient: Right arm, Left arm, Chest, Abdomen, Front perineal area, Buttocks, Right upper leg, Left upper leg, Right lower leg, Left lower leg, Face         Bathing assist  Assist Level: Supervision/Verbal cueing     Upper Body Dressing/Undressing Upper body dressing   What is the patient wearing?: Pull over shirt    Upper body assist Assist Level: Supervision/Verbal cueing    Lower Body Dressing/Undressing Lower body dressing      What is the patient wearing?: Pants, Underwear/pull up     Lower body assist Assist for lower body dressing: Supervision/Verbal cueing     Toileting Toileting Toileting Activity did not occur (Clothing management and hygiene only): N/A (no void or bm)  Toileting assist Assist for toileting: Supervision/Verbal cueing Assistive Device Comment: walker   Transfers Chair/bed transfer  Transfers assist     Chair/bed transfer assist level: Supervision/Verbal cueing     Locomotion Ambulation   Ambulation assist      Assist level: Minimal Assistance - Patient > 75% Assistive device: Hand held assist Max distance: 150   Walk 10 feet activity   Assist     Assist level: Minimal Assistance - Patient > 75% Assistive device: Hand held assist   Walk 50 feet activity   Assist    Assist level: Minimal Assistance - Patient > 75% Assistive device: Hand held assist    Walk 150 feet activity   Assist    Assist level: Minimal Assistance - Patient > 75% Assistive device: Hand held assist    Walk 10 feet on uneven surface  activity   Assist     Assist level: Moderate Assistance - Patient - 50 - 74% Assistive device: Hand held  assist   Wheelchair     Assist Will patient use wheelchair at discharge?: No   Wheelchair activity did not occur: N/A         Wheelchair 50 feet with 2 turns activity    Assist    Wheelchair 50 feet with 2 turns activity did not occur: N/A       Wheelchair 150 feet activity     Assist  Wheelchair 150 feet activity did not occur: N/A       Blood pressure 126/74, pulse 65, temperature 97.7 F (36.5 C), resp. rate 18, height 5\' 6"  (1.676 m),  weight 72.6 kg, SpO2 97 %.  Medical Problem List and Plan: 1.    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  Antithrombotics: -DVT/anticoagulation:  Pharmaceutical: Lovenox             -antiplatelet therapy: DAPT 3. Pain Management: Tylenol prn.  4. Mood: LCSW to follow for evaluation and support.              -antipsychotic agents: N/A 5. Neuropsych: This patient is capable of making decisions on his own behalf. 6. Skin/Wound Care:  Routine pressure relief measures.  7. Fluids/Electrolytes/Nutrition: Monitor I/O.  8. 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 HTN: Monitor BP   On no anti-hypertensives at present Vitals:   07/09/19 1932 07/10/19 0506  BP: 122/72 126/74  Pulse: 68 65  Resp: 18 18  Temp: 98.1 F (36.7 C) 97.7 F (36.5 C)  SpO2: 97% 97%   Controlled on 6/6 10.  Dyslipidemia: LDL-131. Now on Crestor.  11. Hypokalemia: Supplemented X 2 on 6/2  Potassium 4.1 on 6/4, labs ordered for tomorrow 12. H/o GERD/esophagitis: Continue protonix- used omeprazole at home, but needs Protonix since doesn't interact with Plavix.  13.  AKI versus CKD  Labs ordered for tomorrow  LOS: 3 days A FACE TO FACE EVALUATION WAS PERFORMED  Neka Bise Lorie Phenix 07/10/2019, 12:56 PM

## 2019-07-10 NOTE — Progress Notes (Signed)
Physical Therapy Session Note  Patient Details  Name: Russell Herman MRN: [id number] Date of Birth: [birth year]  Today's Date: 07/10/2019 PT Individual Time: 9093-1121 PT Individual Time Calculation (min): 72 min   Short Term Goals: Week 1:  PT Short Term Goal 1 (Week 1): STG=LTG due to ELOS  Skilled Therapeutic Interventions/Progress Updates: Pt presents sitting in recliner and agreeable to participate in therapy.  Pt transfers sit to stand w/ CGA and then amb w/o AD and min A x 6' to w/c.  Pt amb w/ decreased arm swing and occasional left foot drag.  Pt wheeled outside and performed amb w/ RW and CGA w/ verbal cues for posture, walker management and left foot clearance/step length on uneven surface.  Pt amb approx. 64' including turns to return to surface.  Pt transferring from w/c, arm chair and bench outside w/ CGA and verbal cues to improve independence.  Pt returned to gym and pt performed [employer] using upper quadrants crossing midline, on ground, cushioned surface and unilateral stance (right) on 6" platform w/o LOB.  Pt performed toe-taps to 3 3/4" platform w/ LOB, verbal cues for placement of left foot to mirror right.  Pt performed step-ups to same platform w/ HHA w/ occasional LOB to left.  Pt performed 2 x 5 sit to stand transfers holding yellow T-ball w/ emphasis on initiation, especially forward lean as well as good eccentric control stand to sit.  Pt amb multiple trials w/ RW and supervision up to 150' w/ cueing for left LE placement for BOS, especially w/ turns.  Pt amb into room to recliner and remained sitting w/ chair alarm on and all needs in reach.     Therapy Documentation Precautions:  Precautions Precautions: Fall Precaution Comments: Mild Lt hemi Restrictions Weight Bearing Restrictions: No General:   Vital Signs: Therapy Vitals Temp: 98.1 F (36.7 C) Pulse Rate: 73 Resp: 14 BP: 103/68 Patient Position (if appropriate): Lying Oxygen Therapy SpO2: 97 % O2  Device: Room Air Pain:  no c/o pain.    Therapy/Group: Individual Therapy  Ladoris Gene 07/10/2019, 3:37 PM

## 2019-07-11 ENCOUNTER — Inpatient Hospital Stay (HOSPITAL_COMMUNITY): Payer: Medicare Other | Admitting: Physical Therapy

## 2019-07-11 ENCOUNTER — Inpatient Hospital Stay (HOSPITAL_COMMUNITY): Payer: Medicare Other | Admitting: Occupational Therapy

## 2019-07-11 LAB — BASIC METABOLIC PANEL
Anion gap: 10 (ref 5–15)
BUN: 16 mg/dL (ref 8–23)
CO2: 24 mmol/L (ref 22–32)
Calcium: 9.4 mg/dL (ref 8.9–10.3)
Chloride: 102 mmol/L (ref 98–111)
Creatinine, Ser: 1.17 mg/dL (ref 0.61–1.24)
GFR calc Af Amer: >60 mL/min (ref >60)
GFR calc non Af Amer: >60 mL/min (ref >60)
Glucose, Bld: 134 mg/dL — ABNORMAL HIGH (ref 70–99)
Potassium: 3.8 mmol/L (ref 3.5–5.1)
Sodium: 136 mmol/L (ref 135–145)

## 2019-07-11 LAB — CBC
HCT: 43.7 % (ref 39.0–52.0)
Hemoglobin: 15.2 g/dL (ref 13.0–17.0)
MCH: 30 pg (ref 26.0–34.0)
MCHC: 34.8 g/dL (ref 30.0–36.0)
MCV: 86.4 fL (ref 80.0–100.0)
Platelets: 280 10*3/uL (ref 150–400)
RBC: 5.06 MIL/uL (ref 4.22–5.81)
RDW: 12.5 % (ref 11.5–15.5)
WBC: 7.9 10*3/uL (ref 4.0–10.5)
nRBC: 0 % (ref 0.0–0.2)

## 2019-07-11 NOTE — Progress Notes (Signed)
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 C/o tingling pain in LLE and LUE, MD aware. Pt ambulated to gym with CGA with RW approx 100-150 feet no rest breaks and occasional verbal cues for picking up LLE. Dynamic seated and standing activities without UE support for reaching with LUE for cards/small items in various planes and matching on board while crossing midline to improve GMC/FMC of LUE. AROM techniques with bilateral integration while holding 2# dowel to facilitate scapular movement and increase body awareness. Ambulated back to bathroom, performed toileting all aspects with CGA with dynamic standing at sink level for both oral hygiene and hand washing with CGA. Pt up in recliner with alarm on and call bell in reach, all needs met.   Session 2: Pt greeted at time of session sitting up in recliner, agreeable to OT session to focus on bathing/dressing/toileting and dynamic standing balance. Pt doffed UB/LB clothing with supervision and donned crocs to walk to bathroom with RW, supervision. Pt performed toileting 3/3 aspects with supervision and CGA for transfer to TTB for form and fully turning to sit on TTB prior to sitting. UB/LB bathing with supervision, able to stand with unilateral support to wash buttocks. After drying and ambulating back to his room, pt performed UB/LB dressing with supervision, except min A to don socks this date because they were new and not stretched, normally performs with supervision. Note some L side neglect this date  after dropping items on L side throughout ADL session. After bathing and dressing, ambulated to gym with Supervision - CGA with RW and participated in [place] with crossing midline with BUEs to reach in opposite planes to improve FMC/GMC in LUE and improve dynamic standing balance. Pt returned back to room, alarm on, call bell in reach. Pt did c/o L foot pain across all toes and nursing notified.   Therapy Documentation Precautions:  Precautions Precautions: Fall Precaution Comments: Mild Lt hemi Restrictions Weight Bearing Restrictions: No Pain: unclear number rating, c/o tingling and MD aware in AM session Pain in toes on L foot in afternoon, nursing notified  Therapy/Group: Individual Therapy  Viona Gilmore 07/11/2019, 12:06 PM

## 2019-07-11 NOTE — Progress Notes (Signed)
[place] PHYSICAL MEDICINE & REHABILITATION PROGRESS NOTE   Subjective/Complaints: Patient seen in physical therapy.  Discussed his sensory deficits.  He has some tingling on the left side in the upper and lower limb but no pain.  Have reviewed lab work from today  ROS: Denies CP, SOB, N/V/D  Objective:   No results found. Recent Labs    07/11/19 0843  WBC 7.9  HGB 15.2  HCT 43.7  PLT 280   Recent Labs    07/11/19 0843  NA 136  K 3.8  CL 102  CO2 24  GLUCOSE 134*  BUN 16  CREATININE 1.17  CALCIUM 9.4    Intake/Output Summary (Last 24 hours) at 07/11/2019 1000 Last data filed at 07/11/2019 1287 Gross per 24 hour  Intake 720 ml  Output 800 ml  Net -80 ml     Physical Exam: Vital Signs Blood pressure 113/74, pulse 66, temperature 98 F (36.7 C), temperature source Oral, resp. rate 16, height 5\' 6"  (1.676 m), weight 72.6 kg, SpO2 96 %.   General: No acute distress Mood and affect are appropriate Heart: Regular rate and rhythm no rubs murmurs or extra sounds Lungs: Clear to auscultation, breathing unlabored, no rales or wheezes Abdomen: Positive bowel sounds, soft nontender to palpation, nondistended Extremities: No clubbing, cyanosis, or edema Skin: No evidence of breakdown, no evidence of rash  Neurologic: Alert Motor: RUE/RLE: 5/5 proximal distal LUE/LLE: 4+/5 proximal distal  Assessment/Plan: 1. Functional deficits secondary to Right thalamic which require 3+ hours per day of interdisciplinary therapy in a comprehensive inpatient rehab setting. Physiatrist is providing close team supervision and 24 hour management of active medical problems listed below. Physiatrist and rehab team continue to assess barriers to discharge/monitor patient progress toward functional and medical goals  Care Tool:  Bathing    Body parts bathed by patient: Right arm, Left arm, Chest, Abdomen, Front perineal area, Buttocks, Right upper leg, Left upper leg, Right lower leg,  Left lower leg, Face         Bathing assist Assist Level: Supervision/Verbal cueing     Upper Body Dressing/Undressing Upper body dressing   What is the patient wearing?: Pull over shirt    Upper body assist Assist Level: Supervision/Verbal cueing    Lower Body Dressing/Undressing Lower body dressing      What is the patient wearing?: Pants, Underwear/pull up     Lower body assist Assist for lower body dressing: Supervision/Verbal cueing     Toileting Toileting Toileting Activity did not occur (Clothing management and hygiene only): N/A (no void or bm)  Toileting assist Assist for toileting: Contact Guard/Touching assist Assistive Device Comment: walker   Transfers Chair/bed transfer  Transfers assist     Chair/bed transfer assist level: Contact Guard/Touching assist     Locomotion Ambulation   Ambulation assist      Assist level: Supervision/Verbal cueing Assistive device: Walker-rolling Max distance: 150   Walk 10 feet activity   Assist     Assist level: Supervision/Verbal cueing Assistive device: Walker-rolling   Walk 50 feet activity   Assist    Assist level: Supervision/Verbal cueing Assistive device: Walker-rolling    Walk 150 feet activity   Assist    Assist level: Supervision/Verbal cueing Assistive device: Walker-rolling    Walk 10 feet on uneven surface  activity   Assist     Assist level: Minimal Assistance - Patient > 75% Assistive device: Aeronautical engineer Will patient use wheelchair at  discharge?: No   Wheelchair activity did not occur: N/A         Wheelchair 50 feet with 2 turns activity    Assist    Wheelchair 50 feet with 2 turns activity did not occur: N/A       Wheelchair 150 feet activity     Assist  Wheelchair 150 feet activity did not occur: N/A       Blood pressure 113/74, pulse 66, temperature 98 F (36.7 C), temperature source Oral, resp. rate 16,  height 5\' 6"  (1.676 m), weight 72.6 kg, SpO2 96 %.  Medical Problem List and Plan: 1.    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  Antithrombotics: -DVT/anticoagulation:  Pharmaceutical: Lovenox             -antiplatelet therapy: DAPT 3. Pain Management: Tylenol prn.  4. Mood: LCSW to follow for evaluation and support.              -antipsychotic agents: N/A 5. Neuropsych: This patient is capable of making decisions on his own behalf. 6. Skin/Wound Care:  Routine pressure relief measures.  7. Fluids/Electrolytes/Nutrition: Monitor I/O.  8. 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 HTN: Monitor BP   On no anti-hypertensives at present Vitals:   07/10/19 1940 07/11/19 0454  BP: 116/67 113/74  Pulse: 70 66  Resp: 16 16  Temp: 98 F (36.7 C) 98 F (36.7 C)  SpO2: 99% 96%   Controlled on 6/7 10.  Dyslipidemia: LDL-131. Now on Crestor.  11. Hypokalemia: Supplemented X 2 on 6/2  Potassium 4.1 on 6/4, labs 6/7 K+ 3.8 12. H/o GERD/esophagitis: Continue protonix- used omeprazole at home, but needs Protonix since doesn't interact with Plavix.  13.  AKI versus CKD  Labs ordered for tomorrow  LOS: 4 days A FACE TO FACE EVALUATION WAS PERFORMED  Charlett Blake 07/11/2019, 10:00 AM

## 2019-07-11 NOTE — Progress Notes (Signed)
Physical Therapy Session Note  Patient Details  Name: Russell Herman MRN: [id number] Date of Birth: February 27, 1945  Today's Date: 07/11/2019 PT Individual Time: 2395-3202  And 1100-1200 PT Individual Time Calculation (min): 45 min and 60 min   Short Term Goals: Week 1:  PT Short Term Goal 1 (Week 1): STG=LTG due to ELOS  Skilled Therapeutic Interventions/Progress Updates: Pt presented in recliner agreeable to therapy. Pt states some soreness in back which pt credits to sitting in recliner. Pt requesting to use bathroom prior to leaving room. Pt ambulated to bathroom with RW and CGA to close S. Performed toilet transfers with CGA (+void). Pt then ambulated to bed and donned pants, socks, shoes and supervision. Pt ambulated to rehab gym and CGA and RW with pt intermittently requiring cues for improved L foot clearance, improved heel strike and step length. In gym pt participated in toe taps to target at midline, 45 degrees to L and sidestepping. Performed initially with minA progressed to [place]. Pt also participated in toe taps to colored cones with PTA incorporating midline. Pt also participated in forward stepping with for forced use of LLE requiring CGA. Pt ambulated back to room with RW and CGA and end of session and returned to recliner. Pt left with seat alarm on, call bell within reach and needs met.   Tx2: Pt presented in recliner agreeable to therapy. Session focused on gait training on treadmill. Pt ambulated to day room with RW and CGA. Pt set up on treadmill and participated in x 3 bouts of 4-5 min ea as follows.  4 min 231 ft .6 - .7 mph 4 min 241 ft .7 mph 5 min 350 ft .8 mph  Pt required intermittent cues for erect posture, increased step length and improved heel strike. PTA noted that heel strike improved by third bout and significantly improved L foot clearance. Pt then participated in gait training on level tile with noted carryover. Pt ambulated 40f x 4 with seated rest break. Pt  initially very rigid with UE and posterior lean at shoulders. With verbal cues pt able to incorporate short arm swing and improved posture. Pt also noted to have consistent heel strike and minimal "catching" of L foot. Pt ambulated back to room with RW and end of session with close S and requested to use bathroom prior to returning to chair. Performed toilet transfers with S (+void) and performed hand hygiene at sink with supervision. Pt returned to recliner at end of session and left with seat alarm on, call bell within reach and needs met.      Therapy Documentation Precautions:  Precautions Precautions: Fall Precaution Comments: Mild Lt hemi Restrictions Weight Bearing Restrictions: No General:   Vital Signs: Therapy Vitals Temp: 98.1 F (36.7 C) Pulse Rate: 75 Resp: 18 BP: 111/68 Patient Position (if appropriate): Sitting Oxygen Therapy SpO2: 98 % O2 Device: Room Air Pain:   Mobility:   Locomotion :    Trunk/Postural Assessment :    Balance:   Exercises:   Other Treatments:      Therapy/Group: Individual Therapy  Cidney Kirkwood 07/11/2019, 4:16 PM

## 2019-07-12 ENCOUNTER — Inpatient Hospital Stay (HOSPITAL_COMMUNITY): Payer: Medicare Other | Admitting: Occupational Therapy

## 2019-07-12 ENCOUNTER — Inpatient Hospital Stay (HOSPITAL_COMMUNITY): Payer: Medicare Other | Admitting: Physical Therapy

## 2019-07-12 NOTE — Progress Notes (Signed)
[place] PHYSICAL MEDICINE & REHABILITATION PROGRESS NOTE   Subjective/Complaints:  No issues overnght, reviewed labs   ROS: Denies CP, SOB, N/V/D  Objective:   No results found. Recent Labs    07/11/19 0843  WBC 7.9  HGB 15.2  HCT 43.7  PLT 280   Recent Labs    07/11/19 0843  NA 136  K 3.8  CL 102  CO2 24  GLUCOSE 134*  BUN 16  CREATININE 1.17  CALCIUM 9.4    Intake/Output Summary (Last 24 hours) at 07/12/2019 0849 Last data filed at 07/12/2019 0700 Gross per 24 hour  Intake 240 ml  Output 875 ml  Net -635 ml     Physical Exam: Vital Signs Blood pressure 114/75, pulse 64, temperature 98 F (36.7 C), temperature source Oral, resp. rate 18, height 5\' 6"  (1.676 m), weight 72.6 kg, SpO2 96 %.    General: No acute distress Mood and affect are appropriate Heart: Regular rate and rhythm no rubs murmurs or extra sounds Lungs: Clear to auscultation, breathing unlabored, no rales or wheezes Abdomen: Positive bowel sounds, soft nontender to palpation, nondistended Extremities: No clubbing, cyanosis, or edema Skin: No evidence of breakdown, no evidence of rash Neurologic: Cranial nerves II through XII intact, motor strength is 5/5 in bilateral deltoid, bicep, tricep, grip, hip flexor, knee extensors, ankle dorsiflexor and plantar flexor Reduced sensation LUE and LLE  Cerebellar exam dysmetria Left FNFMusculoskeletal: Full range of motion in all 4 extremities. No joint swelling  Assessment/Plan: 1. Functional deficits secondary to Right thalamic which require 3+ hours per day of interdisciplinary therapy in a comprehensive inpatient rehab setting.  Physiatrist is providing close team supervision and 24 hour management of active medical problems listed below.  Physiatrist and rehab team continue to assess barriers to discharge/monitor patient progress toward functional and medical goals  Care Tool:  Bathing    Body parts bathed by patient: Right arm, Left arm,  Chest, Abdomen, Front perineal area, Buttocks, Right upper leg, Left upper leg, Right lower leg, Left lower leg, Face         Bathing assist Assist Level: Supervision/Verbal cueing     Upper Body Dressing/Undressing Upper body dressing   What is the patient wearing?: Pull over shirt    Upper body assist Assist Level: Supervision/Verbal cueing    Lower Body Dressing/Undressing Lower body dressing      What is the patient wearing?: Pants, Underwear/pull up     Lower body assist Assist for lower body dressing: Supervision/Verbal cueing     Toileting Toileting Toileting Activity did not occur (Clothing management and hygiene only): N/A (no void or bm)  Toileting assist Assist for toileting: Contact Guard/Touching assist Assistive Device Comment: walker   Transfers Chair/bed transfer  Transfers assist     Chair/bed transfer assist level: Supervision/Verbal cueing     Locomotion Ambulation   Ambulation assist      Assist level: Supervision/Verbal cueing Assistive device: Walker-rolling Max distance: 150   Walk 10 feet activity   Assist     Assist level: Supervision/Verbal cueing Assistive device: Walker-rolling   Walk 50 feet activity   Assist    Assist level: Supervision/Verbal cueing Assistive device: Walker-rolling    Walk 150 feet activity   Assist    Assist level: Supervision/Verbal cueing Assistive device: Walker-rolling    Walk 10 feet on uneven surface  activity   Assist     Assist level: Minimal Assistance - Patient > 75% Assistive device: Chemical engineer  Assist Will patient use wheelchair at discharge?: No   Wheelchair activity did not occur: N/A         Wheelchair 50 feet with 2 turns activity    Assist    Wheelchair 50 feet with 2 turns activity did not occur: N/A       Wheelchair 150 feet activity     Assist  Wheelchair 150 feet activity did not occur: N/A       Blood  pressure 114/75, pulse 64, temperature 98 F (36.7 C), temperature source Oral, resp. rate 18, height 5\' 6"  (1.676 m), weight 72.6 kg, SpO2 96 %.  Medical Problem List and Plan: 1.    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  Antithrombotics: -DVT/anticoagulation:  Pharmaceutical: Lovenox             -antiplatelet therapy: DAPT 3. Pain Management: Tylenol prn.  4. Mood: LCSW to follow for evaluation and support.              -antipsychotic agents: N/A 5. Neuropsych: This patient is capable of making decisions on his own behalf. 6. Skin/Wound Care:  Routine pressure relief measures.  7. Fluids/Electrolytes/Nutrition: Monitor I/O.  8. 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 HTN: Monitor BP   On no anti-hypertensives at present Vitals:   07/11/19 1935 07/12/19 0441  BP: 108/79 114/75  Pulse: 77 64  Resp: 18 18  Temp: 98.4 F (36.9 C) 98 F (36.7 C)  SpO2: 97% 96%   Controlled on 6/8 10.  Dyslipidemia: LDL-131. Now on Crestor.  11. Hypokalemia:Resolved  Supplemented X 2 on 6/2  Potassium 4.1 on 6/4, labs 6/7 K+ 3.8 12. H/o GERD/esophagitis: Continue protonix- used omeprazole at home, but needs Protonix since doesn't interact with Plavix.  13.  AKI resolved   LOS: 5 days A FACE TO FACE EVALUATION WAS PERFORMED  Charlett Blake 07/12/2019, 8:49 AM

## 2019-07-12 NOTE — Progress Notes (Signed)
Slept good last night. Patient reports that he doesn't usually take singulair everyday. ? DC. Russell Herman

## 2019-07-12 NOTE — Progress Notes (Signed)
Physical Therapy Session Note  Patient Details  Name: Russell Herman MRN: [id number] Date of Birth: 10-Dec-1945  Today's Date: 07/12/2019 PT Individual Time: 0900-1000 and 1115-1200 PT Individual Time Calculation (min): 60 min and 45 min  Short Term Goals: Week 1:  PT Short Term Goal 1 (Week 1): STG=LTG due to ELOS  Skilled Therapeutic Interventions/Progress Updates: Pt presented in recliner agreeable to therapy. Pt denies pain during session. Pt ambulated to rehab gym with supervision and improved B foot clearance. Pt participated in agility ladder for dynamic balance and coordination. Pt participated in step to's leading with L foot, side stepping, and zig zags. Pt was able to perform all CGA and no LOB. Performed STS with 2lb weighted ball x10 then STS on Airex x 10 no AD. Pt then participated in tilt board at parallel bars working on achieving midline while standing on board, then performing lateral shifts and AP shifts with decreasing UE support at bars. Also participated in Biodex LOS x 3 trials total with 1 trial with UE support and x 2 without. Pt initially requiring manual facilitation at hips to decrease leading with shoulders for wt shifting and use of UE however improved with additional trials. Pt scored 29% in 1:34 on second trial and 41% in 1:03 on third with significantly improved ankle strategy. Pt then ambulated back to room and used bathroom with distant supervision (+void/BM). Pt returned to recliner and left with seat alarm on, call bell within reach and needs met.   Tx2: Pt presented in recliner agreeable to therapy. Pt sates some soreness/achiness in L knee, pt does not want any intervention during session.  Pt ambulated to rehab gym with RW and close S with noted some increased L foot catching this session which pt was aware of as well. Discussed extensively with pt HHPT vs OPPT, pro/cons of each and pt advised that although would prefer OPPT due to current family situation would be  easier to start with HHPT. Advised that pt should be able to progress to OOPT after HHPT to which pt was agreeable to. PTA provided pt with OTAGO "B" exercises which pt was able to perform at counter and with close S safely. Activities included tandem stance, SL stance, mini squats, backwards walking, side stepping, ascending/descending x 4 steps with B rails and figure eights. Pt verbalized understanding that activities should be done with supervision and that all exercises need not be done at one time. PTA also discussed energy conservation with pt in regards to pt having to do most household activities with pt verbalizing understanding. Pt ambulated back to room at end of session and used bathroom and performed hand hygiene with supervision. Pt returned to recliner at end of session and left with seat alarm on, call bell within reach and needs met.      Therapy Documentation Precautions:  Precautions Precautions: Fall Precaution Comments: Mild Lt hemi Restrictions Weight Bearing Restrictions: No General:   Vital Signs: Therapy Vitals Temp: 98.2 F (36.8 C) Pulse Rate: 70 Resp: 19 BP: 109/72 Patient Position (if appropriate): Sitting Oxygen Therapy SpO2: 97 % O2 Device: Room Air    Therapy/Group: Individual Therapy  Kebra Lowrimore  Ramona Ruark, PTA  07/12/2019, 4:21 PM

## 2019-07-12 NOTE — Progress Notes (Signed)
Occupational Therapy Session Note  Patient Details  Name: Russell Herman MRN: [id number] Date of Birth: 11/24/1945  Today's Date: 07/12/2019 OT Individual Time: 2010-0712 and 1975-8832 OT Individual Time Calculation (min): 60 min and 40 min   Short Term Goals: Week 1:  OT Short Term Goal 1 (Week 1): STGs=LTGs due to ELOS  Skilled Therapeutic Interventions/Progress Updates:    Session 1: Pt greeted at time of session reclined in bed agreeable to OT session focused on dressing, dynamic standing, and NMR of LUE. Bed mobility supervision and once sitting EOB performed UB/LB dressing with supervision including socks and shoes. Ambulated to the gym with supervision - CGA with RW with occasional cues for clearing LLE. Pt participated dynamic standing task of bending/reaching at ground and knee height for various objects with cues for RW placement to simulate leisure activities at home and improve balance. Pt participated in numerous FMC/GMC activities in standing and sitting while reaching in various planes/heights for item retrieval while focusing on fine pincer, in hand manipulation, and translation. Note that throughout all activities pt had some inattention to his L side missing some cones/pegs/etc. Therapist placed objects on the patient's left side throughout activities to encourage attending to his L side. C/o mild LLE pain/numbness/tingling and MD aware when making his rounds. Pt ambulated back to his room with RW and performed toileting 3/3 tasks with supervision throughout the task, unable to void. Pt returned to recliner with alarm on, call bell in reach, all needs met.   Session 2: Pt greeted later in the day seated up in recliner agreeable to OT session to address sink level grooming/hygiene, LUE FMC and NMR of LUE. Pt participated in several [facility] activities to improve fine pincer, translation, and in hand manipulation for LUE to improve functional use. Note pt able to manipulate larger foam  pieces without difficulty but did drop several small beads/pieces from L hand. Pt ambulated to sink with supervision with RW and completed shaving with periods of no UE support and unilateral support with supervision, no LOB in standing or fatigue. Pt left up in chair with alarm on, call bell in reach, all needs met.   Therapy Documentation Precautions:  Precautions Precautions: Fall Precaution Comments: Mild Lt hemi Restrictions Weight Bearing Restrictions: No Pain: 2/10 in LLE, MD aware   Therapy/Group: Individual Therapy  Viona Gilmore 07/12/2019, 12:10 PM

## 2019-07-13 ENCOUNTER — Inpatient Hospital Stay (HOSPITAL_COMMUNITY): Payer: Medicare Other | Admitting: Physical Therapy

## 2019-07-13 ENCOUNTER — Inpatient Hospital Stay (HOSPITAL_COMMUNITY): Payer: Medicare Other | Admitting: Occupational Therapy

## 2019-07-13 NOTE — Progress Notes (Signed)
Physical Therapy Session Note  Patient Details  Name: Russell Herman MRN: [id number] Date of Birth: September 15, 1945  Today's Date: 07/13/2019 PT Individual Time: 1345-1430 PT Individual Time Calculation (min): 45 min   Short Term Goals: Week 1:  PT Short Term Goal 1 (Week 1): STG=LTG due to ELOS  Skilled Therapeutic Interventions/Progress Updates: Pt presented in recliner agreeable to therapy. Pt denies pain during session. Session focused on ambulation in community setting with RW in preparation for d/c. Pt ambulated from room to elevators then through atrium to [address] [facility] patio with RW and only x 1 standing rest break while in elevator. Pt demonstrated supervision throughout and only required x 1 verbal cue for safety with RW when crossing threshold stepping on door mat. Pt then ambulated outside on patio with RW and supervision demonstrating good safety with RW throughout and intermittent foot catching when becoming more fatigued. After seated rest break pt ambulated through [facility] entrance demonstrated improved safety with rugs and thresholds. Pt then ambulated back to unit with supervision overall. Pt then participated in Cybex Kinetron at 60cm/sec 55mn x 2 for general conditioning. Pt then ambulated back to room at end of session with RW and supervision and used bathroom with distant supervision prior to returning to recliner. Pt left in recliner with seat alarm on, call bell within reach and needs met.      Therapy Documentation Precautions:  Precautions Precautions: Fall Precaution Comments: Mild Lt hemi Restrictions Weight Bearing Restrictions: No General:   Vital Signs: Therapy Vitals Temp: 98.2 F (36.8 C) Pulse Rate: 70 Resp: 19 BP: 101/74 Patient Position (if appropriate): Sitting Oxygen Therapy SpO2: 97 % O2 Device: Room Air    Therapy/Group: Individual Therapy  Clifton Safley  Tarek Cravens, PTA  07/13/2019, 3:51 PM

## 2019-07-13 NOTE — Progress Notes (Addendum)
[place] PHYSICAL MEDICINE & REHABILITATION PROGRESS NOTE   Subjective/Complaints:  Seen in PT gym, amb with [facility] supervision requires cuing for step length   ROS: Denies CP, SOB, N/V/D  Objective:   No results found. Recent Labs    07/11/19 0843  WBC 7.9  HGB 15.2  HCT 43.7  PLT 280   Recent Labs    07/11/19 0843  NA 136  K 3.8  CL 102  CO2 24  GLUCOSE 134*  BUN 16  CREATININE 1.17  CALCIUM 9.4    Intake/Output Summary (Last 24 hours) at 07/13/2019 0835 Last data filed at 07/13/2019 0700 Gross per 24 hour  Intake 480 ml  Output --  Net 480 ml     Physical Exam: Vital Signs Blood pressure 116/80, pulse 67, temperature 98 F (36.7 C), resp. rate 16, height 5\' 6"  (1.676 m), weight 72.6 kg, SpO2 97 %.     General: No acute distress Mood and affect are appropriate Heart: Regular rate and rhythm no rubs murmurs or extra sounds Lungs: Clear to auscultation, breathing unlabored, no rales or wheezes Abdomen: Positive bowel sounds, soft nontender to palpation, nondistended Extremities: No clubbing, cyanosis, or edema Skin: No evidence of breakdown, no evidence of rash Neurologic: Cranial nerves II through XII intact, motor strength is 5/5 in bilateral deltoid, bicep, tricep, grip, hip flexor, knee extensors, ankle dorsiflexor and plantar flexor Sensory exam intact LT but poor functional position sense with ambulation  Gait uneven step Length wit LLE  Musculoskeletal: Full range of motion in all 4 extremities. No joint swelling Cerebellar exam dysmetria Left FNFMusculoskeletal: Full range of motion in all 4 extremities. No joint swelling  Assessment/Plan: 1. Functional deficits secondary to Right thalamic which require 3+ hours per day of interdisciplinary therapy in a comprehensive inpatient rehab setting.  Physiatrist is providing close team supervision and 24 hour management of active medical problems listed below.  Physiatrist and rehab team continue to  assess barriers to discharge/monitor patient progress toward functional and medical goals  Care Tool:  Bathing    Body parts bathed by patient: Right arm, Left arm, Chest, Abdomen, Front perineal area, Buttocks, Right upper leg, Left upper leg, Right lower leg, Left lower leg, Face         Bathing assist Assist Level: Supervision/Verbal cueing     Upper Body Dressing/Undressing Upper body dressing   What is the patient wearing?: Pull over shirt    Upper body assist Assist Level: Supervision/Verbal cueing    Lower Body Dressing/Undressing Lower body dressing      What is the patient wearing?: Pants     Lower body assist Assist for lower body dressing: Supervision/Verbal cueing     Toileting Toileting Toileting Activity did not occur (Clothing management and hygiene only): N/A (no void or bm)  Toileting assist Assist for toileting: Supervision/Verbal cueing Assistive Device Comment: walker   Transfers Chair/bed transfer  Transfers assist     Chair/bed transfer assist level: Supervision/Verbal cueing     Locomotion Ambulation   Ambulation assist      Assist level: Supervision/Verbal cueing Assistive device: Walker-rolling Max distance: 150   Walk 10 feet activity   Assist     Assist level: Supervision/Verbal cueing Assistive device: Walker-rolling   Walk 50 feet activity   Assist    Assist level: Supervision/Verbal cueing Assistive device: Walker-rolling    Walk 150 feet activity   Assist    Assist level: Supervision/Verbal cueing Assistive device: Walker-rolling    Walk 10  feet on uneven surface  activity   Assist     Assist level: Minimal Assistance - Patient > 75% Assistive device: Aeronautical engineer Will patient use wheelchair at discharge?: No   Wheelchair activity did not occur: N/A         Wheelchair 50 feet with 2 turns activity    Assist    Wheelchair 50 feet with 2 turns  activity did not occur: N/A       Wheelchair 150 feet activity     Assist  Wheelchair 150 feet activity did not occur: N/A       Blood pressure 116/80, pulse 67, temperature 98 F (36.7 C), resp. rate 16, height 5\' 6"  (1.676 m), weight 72.6 kg, SpO2 97 %.  Medical Problem List and Plan: 1.    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  Antithrombotics: -DVT/anticoagulation:  Pharmaceutical: Lovenox             -antiplatelet therapy: DAPT 3. Pain Management: Tylenol prn.  4. Mood: LCSW to follow for evaluation and support.              -antipsychotic agents: N/A 5. Neuropsych: This patient is capable of making decisions on his own behalf. 6. Skin/Wound Care:  Routine pressure relief measures.  7. Fluids/Electrolytes/Nutrition: Monitor I/O.  8. 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 HTN: Monitor BP   On no anti-hypertensives at present Vitals:   07/12/19 1936 07/13/19 0535  BP: 101/62 116/80  Pulse: 68 67  Resp: 16 16  Temp: 97.6 F (36.4 C) 98 F (36.7 C)  SpO2: 99% 97%   Controlled on 6/8 10.  Dyslipidemia: LDL-131. Now on Crestor.  11. Hypokalemia:Resolved  Supplemented X 2 on 6/2  Potassium 4.1 on 6/4, labs 6/7 K+ 3.8 12. H/o GERD/esophagitis: Continue protonix- used omeprazole at home, but needs Protonix since doesn't interact with Plavix.  13.  AKI resolved   LOS: 6 days A FACE TO FACE EVALUATION WAS PERFORMED  Charlett Blake 07/13/2019, 8:35 AM

## 2019-07-13 NOTE — Progress Notes (Signed)
Occupational Therapy Session Note  Patient Details  Name: Russell Herman MRN: [id number] Date of Birth: [birth year]  Today's Date: 07/13/2019 OT Individual Time: 6184-8592 OT Individual Time Calculation (min): 69 min    Short Term Goals: Week 1:  OT Short Term Goal 1 (Week 1): STGs=LTGs due to ELOS  Skilled Therapeutic Interventions/Progress Updates:    Pt greeted at time of session sitting in recliner agreeable to OT session to focus on shower, dressing, toileting in preparation for upcoming DC. Pt doffed clothing at recliner and ambulated to the bathroom with supervision with RW. Pt completed entire ADL routine including toileting, UB/LB bathing, and UB/LB dressing at supervision-set up level, requiring occasional verbal cues for fully turning prior to sitting on TTB, standing in shower to wash buttocks, and donning/doffing LB clothing. Pt also completed hygiene/grooming at sink level including electric razor and brushing hair with set up assist, no LOB throughout ADL activity and good use of RW noted. Pt ambulated to the ADL apartment with supervision approx 150+ feet and performed TTB transfer with supervision for fully turning prior to sitting. Discussed home bathroom set up for tub shower and detachable handle, getting grip strips for tub floor surface. IADL tasks in kitchen with practice maneuvering RW to access fridge, cabinets, drawers, etc with cues for techniques to decrease bending/squatting, energy conservation, and safety in the kitchen. Pt receptive to techniques and demonstrated maneuvering RW for accessing drawers and cabinets with supervision. Pt ambulated back to his room with RW and performed toileting with supervision all aspects, left up in chair with alarm on, call bell in reach, all needs met.   Therapy Documentation Precautions:  Precautions Precautions: Fall Precaution Comments: Mild Lt hemi Restrictions Weight Bearing Restrictions: No Pain: Numbness and tingling in L  UE/LE  Therapy/Group: Individual Therapy  Viona Gilmore 07/13/2019, 12:25 PM

## 2019-07-13 NOTE — Patient Care Conference (Cosign Needed)
Inpatient RehabilitationTeam Conference and Plan of Care Update Date: 07/13/2019   Time: 10:04 AM    Patient Name: Russell Herman      Medical Record Number: [id number]  Date of Birth: December 26, 1945 Sex: Male         Room/Bed: 4W24C/4W24C-01 Payor Info: Payor: MEDICARE / Plan: MEDICARE PART A AND B / Product Type: *No Product type* /    Admit Date/Time:  07/07/2019 12:12 PM  Primary Diagnosis:  Stroke [facility])  Patient Active Problem List   Diagnosis Date Noted  . AKI (acute kidney injury) ([place])   . Hemiparesis affecting left side as late effect of stroke ([place])   . Decreased GFR   . Dyslipidemia   . Stroke ([place]) 07/06/2019  . TIA (transient ischemic attack) 07/05/2019  . Essential hypertension 07/05/2019  . Hypokalemia 07/05/2019  . Adenomatous colon polyp 01/31/2014  . Basal cell carcinoma 09/23/2013  . Hay fever 09/23/2013  . Vitamin D deficiency 09/23/2013  . Gastric catarrh 11/28/2011  . Hypercholesteremia 11/28/2011  . Laboratory examination 11/28/2011    Expected Discharge Date:    Team Members Present:       Current Status/Progress Goal Weekly Team Focus  Bowel/Bladder   Contient to B&B  Maintain cont, assess QS/ PRN      Swallow/Nutrition/ Hydration             ADL's   Supervision/cuing overall, ambulating with RW  Mod I  Dynamic balance, Lt NMR, d/c planning   Mobility   supervision bed mobility, CGA to close S gait with RW with decreased coordination of LLE causing toe catching. supervision transfers,  mod I with RW  gait, balance, coordination, d/c planning   Communication             Safety/Cognition/ Behavioral Observations            Pain   Pt states pain currently 0/10 on a pain scale  Assess QS/PRN  Address discomfort or pain   Skin   No skin issues at this time  Maintain skin intergrity, no skin breakdown       Rehab Goals Patient on target to meet rehab goals: Yes Rehab Goals Revised: Patient on target with current goals *See Care Plan and  progress notes for long and short-term goals.     Barriers to Discharge  Current Status/Progress Possible Resolutions Date Resolved   Nursing                  PT                    OT                  SLP                Care Coordinator Home environment access/layout 2 Level home ( patient has bed and bath on 1st floor) Patient has ramp to enter back door on target          Discharge Planning/Teaching Needs:  Goal to discharge home  Will schedule if reccommended   Team Discussion:  Sensory deficits to left side, BP controlled. Hypokalemia resolved. Supervision overall and on target to reach MOD I goals  Revisions to Treatment Plan:  None- continue with [facility] PT/OT follow up    Medical Summary Current Status: Poor sensation left upper and left lower limb, blood pressures well controlled Weekly Focus/Goal: Manage safety with ambulation  Barriers to Discharge: Other (comments)  Barriers  to Discharge Comments: Fall risk secondary to poor sensation Possible Resolutions to Barriers: Continue PT, OT, family training   Continued Need for Acute Rehabilitation Level of Care: The patient requires daily medical management by a physician with specialized training in physical medicine and rehabilitation for the following reasons: Direction of a multidisciplinary physical rehabilitation program to maximize functional independence : Yes Medical management of patient stability for increased activity during participation in an intensive rehabilitation regime.: Yes Analysis of laboratory values and/or radiology reports with any subsequent need for medication adjustment and/or medical intervention. : Yes   I attest that I was present, lead the team conference, and concur with the assessment and plan of the team.   Dorien Chihuahua B 07/13/2019, 10:04 AM

## 2019-07-13 NOTE — Progress Notes (Signed)
[name], male   DOB: 07-17-1945, 74 y.o.   MRN: [id number]   Team Conference Report to Patient/Family  Team Conference discussion was reviewed with the patient and caregiver, including goals, any changes in plan of care and target discharge date.  Patient and caregiver express understanding and are in agreement.  The patient has a target discharge date of 07/15/19. Inform patient of [facility] PT and OT recommendations. DME needed: Rolling walker and tub transfer bench.  Dyanne Iha 07/13/2019, 1:25 PM

## 2019-07-13 NOTE — Progress Notes (Signed)
[name], male   DOB: Mar 12, 1945, 74 y.o.   MRN: [id number]   Sw ordered rw and ttb

## 2019-07-13 NOTE — Progress Notes (Signed)
Physical Therapy Session Note  Patient Details  Name: Russell Herman MRN: [id number] Date of Birth: [birth year]  Today's Date: 07/13/2019 PT Individual Time: 0800-0900 and 1287-8676 PT Individual Time Calculation (min): 60 min and 25 min    Short Term Goals: Week 1:  PT Short Term Goal 1 (Week 1): STG=LTG due to ELOS  Skilled Therapeutic Interventions/Progress Updates:  Session 1  Pt received sitting in recliner and agreeable to PT. Pt performed gait training through hall with RW and supervision assist 2x 172f, min cues for safety in turns. PT instructed pt in dynamic gait training with no AD x 1523fand CGA to prevent lateral LOB to the L. PT then instructeed pt in gait with SPC and min assist. Moderate cues for AD management and step through gait pattern, poor sequecning with AD throughout. Gait training to step over 3 obstacles(1") leading with LLE x6 and RLE x 6, performed x2 bouts with rest break between. Forward/reverse 4x1056fith CGA-supervision assist and cues for symmetry R and L.  Side stepping R and L 4 x 10 ft bil with min assist progressing to CGA for safety and cues for posture and symmetry. Figure 8 walking around 2 cones at 6ft11f4, supervision assist overall with cues for step length in turns, no LOB noted.  Pt requesting to use restroom for BM. Pt able to void(+) and perform all clothing management with supervision assist from PT for safety. Patient returned to room and performed stand pivot to recliner with no AD and supervision asist. Pt left sitting in recliner with call bell in reach and all needs met.    Session 2.  Pt received sitting in WC and agreeable to PT. [name] present and engaged in family education. Gait training throughout unit with distant supervision assist/Mod I with RW x 180ft83f0ft 76f250ft. 45f training without AD in rehab gym x 100ft an79fpervision assist for safety, min cues for symmetry R and L and decreased speed in turns. [name] expresses  concerns about access to tub. PT instructed pt access to shower with TTB and supervision assist for safety. PT then educated pt and wife on care transfer to small and large SUV height; encouraged use of small SUV for safety, but pt demonstrated safe access to large SUV using running board and supervision assist. Patient returned to room and performed stand pivot to recliner with RW without assist. Pt left sitting in recliner with call bell in reach and all needs met.           Therapy Documentation Precautions:  Precautions Precautions: Fall Precaution Comments: Mild Lt hemi Restrictions Weight Bearing Restrictions: No Vital Signs: Therapy Vitals Temp: 98 F (36.7 C) Pulse Rate: 67 Resp: 16 BP: 116/80 Patient Position (if appropriate): Lying Oxygen Therapy SpO2: 97 % O2 Device: Room Air Pain: denies  Therapy/Group: Individual Therapy  Zae Kirtz ELorie Phenix1, 9:02 AM

## 2019-07-14 ENCOUNTER — Inpatient Hospital Stay (HOSPITAL_COMMUNITY): Payer: Medicare Other | Admitting: Physical Therapy

## 2019-07-14 ENCOUNTER — Inpatient Hospital Stay (HOSPITAL_COMMUNITY): Payer: Medicare Other | Admitting: Occupational Therapy

## 2019-07-14 NOTE — Progress Notes (Signed)
Occupational Therapy Discharge Summary  Patient Details  Name: Russell Herman MRN: [id number] Date of Birth: 1945-05-31  Today's Date: 07/14/2019  OT Individual Time: 6073-7106 and 2694-8546 OT Individual Time Calculation (mins): 61 min and 30 min   Patient has met 5 of 7 long term goals due to improved activity tolerance, improved balance, postural control, functional use of  LEFT upper extremity, improved awareness and improved coordination.  Patient to discharge at overall Modified Independent - Supervision level. Pt needs supervision during bathing in a wet environment, wife is available to assist. Patient's care partner is independent to provide the necessary physical assistance at discharge.    Reasons goals not met: Pt is supervision for LB bathing for safety in a wet environment, occassionally cues only. Pt is occasionally supervision for dynamic standing during ADL tasks with ise of RW.   Recommendation:  Patient will benefit from ongoing skilled OT services in home health setting (until he can transition to outpatient pending transportation) to continue to advance functional skills in the area of BADL, iADL and leisure activities such as gardening and caring for grandkids.  Equipment: RW and TTB  Reasons for discharge: treatment goals met and discharge from hospital  Patient/family agrees with progress made and goals achieved: Yes   Skilled Interventions: Session 1: Pt greeted at time of session supine in bed and peformed bed mobility supine to sitting with Mod I and ambulated throughout the room with RW with Mod I - Supervision to retrieve his clothes for the day. Ambulated to the bathroom and doffed clothing prior to transferring to TTB with Supervision for occasional verbal cues for transfer to fully turn prior to sitting. Pt performed UB/LB bathing with supervision only for when the patient stands to wash buttocks but otherwise pt is able to complete all aspects of the task.  After drying off in the same manner pt ambulated back to his room and at sink level completed shaving and oral care in standing with Mod I. After grooming, pt performed UB/LB dressing Mod I at EOB with pt able to don his socks and shoes with extended time d/t decreased coordination in LUE. Pt noted while ambulating to have slight L side neglect by lightly hitting objects with no injury but education provided on attending to L side. Pt completed entire ADL session and ambulated throughout the halls without rest breaks with use of RW to improve endurance. Pt returned to room, set up in recliner with alarm on and call bell in reach all needs met.   Session 2: Pt greeted in recliner agreeable to session to focus on IADLs and standing balance. Pt ambulated to the ADL suite and performed transfer to/from standard bed with Mod I with RW able to transition sit <> supine without difficulty in preparation for home. IADL tasks in kitchen to review compensatory techniques for having frequently used items at lower heights, opening shelves, cabinets, and pt demonstrated with teach back. Pt participated in dynamic standing activity using [employer] for crossing midline and pressing button with contralateral side to improve coordination and balance, no significant difference between L and R side. Reviewed home [facility] activities using foam and putty for the pt to continue to work on [facility] for [place]. Pt returned back to his room and performed toileting Mod I, returned back to recliner with alarm on, call bell in reach, all needs met.   OT Discharge Precautions/Restrictions  Restrictions Weight Bearing Restrictions: No Pain Pain Assessment Pain Scale: 0-10 Pain Score: 0-No pain  Faces Pain Scale: No hurt ADL ADL Eating: Not assessed Grooming: Supervision/safety, Setup Where Assessed-Grooming: Standing at sink Upper Body Bathing: Setup Where Assessed-Upper Body Bathing: Shower Lower Body Bathing: Supervision/safety Where  Assessed-Lower Body Bathing: Shower Upper Body Dressing: Setup Where Assessed-Upper Body Dressing: Other (Comment) (sitting on standard toilet) Lower Body Dressing: Supervision/safety Where Assessed-Lower Body Dressing: Edge of bed Toileting: Modified independent Where Assessed-Toileting: Glass blower/designer: Diplomatic Services operational officer Method: Counselling psychologist: Energy manager: Distant supervision, Minimal cueing Tub/Shower Transfer Method: Optometrist: Facilities manager: Curator Method: Ambulating (using RW) Youth worker: Radio broadcast assistant, Grab bars Vision Baseline Vision/History: Wears glasses Wears Glasses: Reading only Patient Visual Report: No change from baseline Perception  Inattention/Neglect: Other (comment) (decreased attention to L visual field but improved since eval) Sensation Sensation Light Touch: Impaired Detail Peripheral sensation comments: mild impairment in LUE Coordination Gross Motor Movements are Fluid and Coordinated: No Fine Motor Movements are Fluid and Coordinated: No Coordination and Movement Description: mild Lt hemi with mild coordination deficits that have improved since eval Motor  Motor Motor: Hemiplegia;Ataxia Motor - Discharge Observations: Mild Lt hemi, coordination has improved since eval Trunk/Postural Assessment  Cervical Assessment Cervical Assessment: Within Functional Limits Thoracic Assessment Thoracic Assessment: Within Functional Limits Lumbar Assessment Lumbar Assessment: Within Functional Limits Postural Control Postural Control: Within Functional Limits  Balance Balance Balance Assessed: Yes Static Sitting Balance Static Sitting - Balance Support: No upper extremity supported Static Sitting - Level of Assistance: 7: Independent Dynamic Sitting Balance Dynamic Sitting - Balance Support:  No upper extremity supported Dynamic Sitting - Level of Assistance: 5: Stand by assistance Dynamic Sitting - Balance Activities: Lateral lean/weight shifting;Forward lean/weight shifting;Reaching for objects Sitting balance - Comments: Good sitting balance with dynamic sitting tasks for forward weight shifting for LB dressing and bathing Dynamic Standing Balance Dynamic Standing - Balance Support: No upper extremity supported Dynamic Standing - Level of Assistance: 5: Stand by assistance Dynamic Standing - Balance Activities: Lateral lean/weight shifting;Forward lean/weight shifting Dynamic Standing - Comments: dynamic standing activities for LB dressing to don/doff over hips Extremity/Trunk Assessment RUE Assessment RUE Assessment: Within Functional Limits LUE Assessment LUE Assessment: Within Functional Limits Active Range of Motion (AROM) Comments: ROM is good and WNL but decreased coordination   Viona Gilmore 07/14/2019, 4:27 PM

## 2019-07-14 NOTE — Progress Notes (Signed)
[place] PHYSICAL MEDICINE & REHABILITATION PROGRESS NOTE   Subjective/Complaints:  Pt feels stronger than at admission   ROS: Denies CP, SOB, N/V/D  Objective:   No results found. No results for input(s): WBC, HGB, HCT, PLT in the last 72 hours. No results for input(s): NA, K, CL, CO2, GLUCOSE, BUN, CREATININE, CALCIUM in the last 72 hours.  Intake/Output Summary (Last 24 hours) at 07/14/2019 0843 Last data filed at 07/14/2019 0654 Gross per 24 hour  Intake 240 ml  Output 725 ml  Net -485 ml     Physical Exam: Vital Signs Blood pressure 114/74, pulse 66, temperature 97.9 F (36.6 C), temperature source Oral, resp. rate 18, height 5\' 6"  (1.676 m), weight 72.6 kg, SpO2 94 %.     General: No acute distress Mood and affect are appropriate Heart: Regular rate and rhythm no rubs murmurs or extra sounds Lungs: Clear to auscultation, breathing unlabored, no rales or wheezes Abdomen: Positive bowel sounds, soft nontender to palpation, nondistended Extremities: No clubbing, cyanosis, or edema Skin: No evidence of breakdown, no evidence of rash Neurologic: Cranial nerves II through XII intact, motor strength is 5/5 in bilateral deltoid, bicep, tricep, grip, hip flexor, knee extensors, ankle dorsiflexor and plantar flexor Sensory exam intact LT but poor functional position sense with ambulation  Gait uneven step Length wit LLE  Musculoskeletal: Full range of motion in all 4 extremities. No joint swelling Cerebellar exam dysmetria Left FNFMusculoskeletal: Full range of motion in all 4 extremities. No joint swelling  Assessment/Plan: 1. Functional deficits secondary to Right thalamic which require 3+ hours per day of interdisciplinary therapy in a comprehensive inpatient rehab setting.  Physiatrist is providing close team supervision and 24 hour management of active medical problems listed below.  Physiatrist and rehab team continue to assess barriers to discharge/monitor patient  progress toward functional and medical goals  Care Tool:  Bathing    Body parts bathed by patient: Right arm, Left arm, Chest, Abdomen, Front perineal area, Buttocks, Right upper leg, Left upper leg, Right lower leg, Left lower leg, Face         Bathing assist Assist Level: Supervision/Verbal cueing     Upper Body Dressing/Undressing Upper body dressing   What is the patient wearing?: Pull over shirt    Upper body assist Assist Level: Set up assist    Lower Body Dressing/Undressing Lower body dressing      What is the patient wearing?: Pants, Underwear/pull up     Lower body assist Assist for lower body dressing: Supervision/Verbal cueing     Toileting Toileting Toileting Activity did not occur (Clothing management and hygiene only): N/A (no void or bm)  Toileting assist Assist for toileting: Supervision/Verbal cueing Assistive Device Comment: RW   Transfers Chair/bed transfer  Transfers assist     Chair/bed transfer assist level: Supervision/Verbal cueing     Locomotion Ambulation   Ambulation assist      Assist level: Supervision/Verbal cueing Assistive device: Walker-rolling Max distance: 180   Walk 10 feet activity   Assist     Assist level: Supervision/Verbal cueing Assistive device: Walker-rolling   Walk 50 feet activity   Assist    Assist level: Supervision/Verbal cueing Assistive device: Walker-rolling    Walk 150 feet activity   Assist    Assist level: Supervision/Verbal cueing Assistive device: Walker-rolling    Walk 10 feet on uneven surface  activity   Assist     Assist level: Supervision/Verbal cueing Assistive device: Chemical engineer  Assist Will patient use wheelchair at discharge?: No   Wheelchair activity did not occur: N/A         Wheelchair 50 feet with 2 turns activity    Assist    Wheelchair 50 feet with 2 turns activity did not occur: N/A       Wheelchair 150  feet activity     Assist  Wheelchair 150 feet activity did not occur: N/A       Blood pressure 114/74, pulse 66, temperature 97.9 F (36.6 C), temperature source Oral, resp. rate 18, height 5\' 6"  (1.676 m), weight 72.6 kg, SpO2 94 %.  Medical Problem List and Plan: 1.    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  Antithrombotics: -DVT/anticoagulation:  Pharmaceutical: Lovenox             -antiplatelet therapy: DAPT 3. Pain Management: Tylenol prn.  4. Mood: LCSW to follow for evaluation and support.              -antipsychotic agents: N/A 5. Neuropsych: This patient is capable of making decisions on his own behalf. 6. Skin/Wound Care:  Routine pressure relief measures.  7. Fluids/Electrolytes/Nutrition: Monitor I/O.  8. 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 HTN: Monitor BP   On no anti-hypertensives at present Vitals:   07/13/19 1943 07/14/19 0314  BP: 115/69 114/74  Pulse: 70 66  Resp: 20 18  Temp: 97.9 F (36.6 C) 97.9 F (36.6 C)  SpO2: 99% 94%   Controlled on 6/10 10.  Dyslipidemia: LDL-131. Now on Crestor.  11. Hypokalemia:Resolved  Supplemented X 2 on 6/2  Potassium 4.1 on 6/4, labs 6/7 K+ 3.8 12. H/o GERD/esophagitis: Continue protonix- used omeprazole at home, but needs Protonix since doesn't interact with Plavix.  13.  AKI resolved   LOS: 7 days A FACE TO FACE EVALUATION WAS PERFORMED  Charlett Blake 07/14/2019, 8:43 AM

## 2019-07-14 NOTE — Progress Notes (Signed)
Physical Therapy Discharge Summary  Patient Details  Name: Russell Herman MRN: [id number] Date of Birth: [birth year]  Today's Date: 07/14/2019 PT Individual Time: 1135-1200 and 1610-1705 PT Individual Time Calculation (min): 25 min and 55 min    Patient has met 9 of 9 long term goals due to improved activity tolerance, improved balance, improved postural control, increased range of motion, ability to compensate for deficits, functional use of  left upper extremity and left lower extremity and improved coordination.  Patient to discharge at an ambulatory level Modified Independent.   Patient's care partner is independent to provide the necessary physical assistance at discharge.  Reasons goals not met: All PT goals met   Recommendation:  Patient will benefit from ongoing skilled PT services in home health setting to continue to advance safe functional mobility, address ongoing impairments in balance, coordination, safety, gait, and minimize fall risk.  Equipment: RW  Reasons for discharge: treatment goals met and discharge from hospital  Patient/family agrees with progress made and goals achieved: Yes   PT treatment:  Session 1  Pt received sitting in WC and agreeable to PT. Gait training with RW to and from [employer] with RW 2 x 160f without cues or assist. PT instructed [place] in modified [place] with hand out provided. Cues for safe use of UE support and instruction to complete 1-2 per day as time allows. [facility] and all needs met.     Session 2.  Pt received sitting in WC and agreeable to PT. PT instructed pt in Grad day assessment to measure progress toward goals. See below for details. Gait training with RW 2 x [id number] 2 without cues or assist from PT. Stair management training x 12 steps with BUE support and min  Cues for step to gait pattern with descent. Patient demonstrates increased fall risk as noted by  score of   45/56 on Berg Balance Scale.  (<36= high risk for falls, close to 100%; 37-45 significant >80%; 46-51 moderate >50%; 52-55 lower >25%). Gait training over unlevel surface with RW and supervision assist and min cues for AD management to reduce fall risk. Patient returned to room and [facility] and all needs met, and wife present.              PT Discharge Precautions/Restrictions Restrictions Weight Bearing Restrictions: No Vital Signs Therapy Vitals Temp: 97.9 F (36.6 C) Temp Source: Oral Pulse Rate: 73 Resp: 18 BP: 118/74 Patient Position (if appropriate): Sitting Oxygen Therapy SpO2: 98 % O2 Device: Room Air Pain Pain Assessment Pain Scale: 0-10 Pain Score: 0-No pain Faces Pain Scale: No hurt Vision/Perception  Perception Inattention/Neglect: Other (comment) (decreased attention to L visual field but improved since eval)  Cognition Overall Cognitive Status: Within Functional Limits for tasks assessed Arousal/Alertness: Awake/alert Orientation Level: Oriented X4 Sensation Sensation Light Touch: Impaired Detail Central sensation comments: mild LUE/LLE parasthesia Peripheral sensation comments: mild impairment in LUE Light Touch Impaired Details: Impaired LLE;Impaired LUE Proprioception: Impaired by gross assessment (mild deficits, but greatly improved from Eval) Coordination Gross Motor Movements are Fluid and Coordinated: No Fine Motor Movements are Fluid and Coordinated: No Coordination and Movement Description: mild Lt hemi with mild coordination deficits that have improved since eval Finger Nose Finger Test: mild dysmetria on the L Heel Shin Test: mild ataxia on the L, greatly improved from eval Motor  Motor  Motor: Hemiplegia;Ataxia Motor - Discharge Observations: Mild Lt hemi and ataxia, coordination has improved since eval  Mobility Bed Mobility Bed Mobility: Rolling Right;Rolling Left;Sit to Supine;Supine to  Sit Rolling Right: Independent Rolling Left: Independent Supine to Sit: Independent Sit to Supine: Independent Transfers Transfers: Sit to [employer] Transfers Sit to Stand: Independent with assistive device Stand Pivot Transfers: Independent with assistive device Transfer (Assistive device): Rolling walker Locomotion  Gait Ambulation: Yes Gait Assistance: Independent with assistive device Gait Distance (Feet): 150 Feet Assistive device: Rolling walker Gait Gait: Yes Gait Pattern: Impaired Gait Pattern: Ataxic;Poor foot clearance - left Stairs / Additional Locomotion Stairs: Yes Stairs Assistance: Supervision/Verbal cueing Stair Management Technique: Two rails Number of Stairs: 12 Height of Stairs: 6 Wheelchair Mobility Wheelchair Mobility: No  Trunk/Postural Assessment  Cervical Assessment Cervical Assessment: Within Functional Limits Thoracic Assessment Thoracic Assessment: Within Functional Limits Lumbar Assessment Lumbar Assessment: Within Functional Limits Postural Control Postural Control: Within Functional Limits  Balance Balance Balance Assessed: Yes Standardized Balance Assessment Standardized Balance Assessment: Berg Balance Test Berg Balance Test Sit to Stand: Able to stand without using hands and stabilize independently Standing Unsupported: Able to stand safely 2 minutes Sitting with Back Unsupported but Feet Supported on Floor or Stool: Able to sit safely and securely 2 minutes Stand to Sit: Sits safely with minimal use of hands Transfers: Able to transfer safely, definite need of hands Standing Unsupported with Eyes Closed: Able to stand 10 seconds safely Standing Ubsupported with Feet Together: Able to place feet together independently and stand for 1 minute with supervision From Standing, Reach Forward with Outstretched Arm: Can reach confidently >25 cm (10") From Standing Position, Pick up Object from Floor: Able to pick up shoe, needs  supervision From Standing Position, Turn to Look Behind Over each Shoulder: Looks behind from both sides and weight shifts well Turn 360 Degrees: Able to turn 360 degrees safely but slowly Standing Unsupported, Alternately Place Feet on Step/Stool: Able to complete >2 steps/needs minimal assist Standing Unsupported, One Foot in Front: Able to plae foot ahead of the other independently and hold 30 seconds Standing on One Leg: Able to lift leg independently and hold equal to or more than 3 seconds Total Score: 45 Static Sitting Balance Static Sitting - Balance Support: No upper extremity supported Static Sitting - Level of Assistance: 7: Independent Dynamic Sitting Balance Dynamic Sitting - Balance Support: No upper extremity supported Dynamic Sitting - Level of Assistance: 7: Independent Dynamic Sitting - Balance Activities: Lateral lean/weight shifting;Forward lean/weight shifting;Reaching for objects Sitting balance - Comments: Good sitting balance with dynamic sitting tasks for forward weight shifting for LB dressing and bathing Static Standing Balance Static Standing - Balance Support: No upper extremity supported Static Standing - Level of Assistance: 6: Modified independent (Device/Increase time) Dynamic Standing Balance Dynamic Standing - Balance Support: Bilateral upper extremity supported;During functional activity Dynamic Standing - Level of Assistance: 6: Modified independent (Device/Increase time) Dynamic Standing - Balance Activities: Lateral lean/weight shifting;Forward lean/weight shifting Dynamic Standing - Comments: dynamic standing activities for LB dressing to don/doff over hips Extremity Assessment  RUE Assessment RUE Assessment: Within Functional Limits LUE Assessment LUE Assessment: Within Functional Limits Active Range of Motion (AROM) Comments: ROM is good and WNL but decreased coordination RLE Assessment RLE Assessment: Within Functional Limits LLE  Assessment LLE Assessment: Exceptions to [facility] Active Range of Motion (AROM) Comments: mild dysmetria General Strength Comments: 5/5    Lorie Phenix 07/14/2019, 4:56 PM

## 2019-07-14 NOTE — Discharge Instructions (Signed)
Inpatient Rehab Discharge Instructions  Russell Herman Discharge date and time:  07/15/19   Activities/Precautions/ Functional Status: Activity: no lifting, driving, or strenuous exercise till cleared by MD Diet: cardiac diet Wound Care: none needed    Functional status:  ___ No restrictions     ___ Walk up steps independently _X__ 24/7 supervision/assistance   ___ Walk up steps with assistance ___ Intermittent supervision/assistance  ___ Bathe/dress independently ___ Walk with walker     __X_ Bathe/dress with assistance ___ Walk Independently    ___ Shower independently ___ Walk with assistance    ___ Shower with assistance _X__ No alcohol __ Return to work/school ________   COMMUNITY REFERRALS UPON DISCHARGE:    Home Health:   PT     OT                    Agency: [facility] Phone: [phone]    Medical Equipment/Items Ordered: Tub Transfer Bench, Rolling Walker                                                 Agency/Supplier: [employer] [facility] [employer]   Special Instructions: 1. ASA/Plavix to continue for 3 months.     STROKE/TIA DISCHARGE INSTRUCTIONS SMOKING Cigarette smoking nearly doubles your risk of having a stroke & is the single most alterable risk factor  If you smoke or have smoked in the last 12 months, you are advised to quit smoking for your health.  Most of the excess cardiovascular risk related to smoking disappears within a year of stopping.  Ask you doctor about anti-smoking medications  [place] Quit Line: 1-800-QUIT NOW  Free Smoking Cessation Classes [phone]  CHOLESTEROL Know your levels; limit fat & cholesterol in your diet  Lipid Panel     Component Value Date/Time   CHOL 193 07/06/2019 0619   TRIG 103 07/06/2019 0619   HDL 33 (L) 07/06/2019 0619   CHOLHDL 5.8 07/06/2019 0619   VLDL 21 07/06/2019 0619   LDLCALC 139 (H) 07/06/2019 0619      Many patients benefit from treatment even if their cholesterol is at goal.  Goal: Total  Cholesterol (CHOL) less than 160  Goal:  Triglycerides (TRIG) less than 150  Goal:  HDL greater than 40  Goal:  LDL (LDLCALC) less than 100   BLOOD PRESSURE American Stroke Association blood pressure target is less that 120/80 mm/Hg  Your discharge blood pressure is:     Monitor your blood pressure  Limit your salt and alcohol intake  Many individuals will require more than one medication for high blood pressure  DIABETES (A1c is a blood sugar average for last 3 months) Goal HGBA1c is under 7% (HBGA1c is blood sugar average for last 3 months)  Diabetes: Pre-diabetes    Lab Results  Component Value Date   HGBA1C 6.2 (H) 07/06/2019     Your HGBA1c can be lowered with medications, healthy diet, and exercise.  Check your blood sugar as directed by your physician  Call your physician if you experience unexplained or low blood sugars.  PHYSICAL ACTIVITY/REHABILITATION Goal is 30 minutes at least 4 days per week  Activity: No driving, Therapies: see above Return to work: N/A  Activity decreases your risk of heart attack and stroke and makes your heart stronger.  It helps control your weight and blood pressure;  helps you relax and can improve your mood.  Participate in a regular exercise program.  Talk with your doctor about the best form of exercise for you (dancing, walking, swimming, cycling).  DIET/WEIGHT Goal is to maintain a healthy weight  Your discharge diet is:  Diet Order            Diet 2 gram sodium Room service appropriate? Yes; Fluid consistency: Thin  Diet effective now            liquids Your height is:  5'6" Your current weight is: Weight: 72.6 kg-159.7 lbs Your Body Mass Index (BMI) is:  BMI (Calculated): 25.84  Following the type of diet specifically designed for you will help prevent another stroke.  Your goal weight is:  155 lbs  Your goal Body Mass Index (BMI) is 19-24.  Healthy food habits can help reduce 3 risk factors for stroke:  High  cholesterol, hypertension, and excess weight.  RESOURCES Stroke/Support Group:  Call [phone]   STROKE EDUCATION PROVIDED/REVIEWED AND GIVEN TO PATIENT Stroke warning signs and symptoms How to activate emergency medical system (call 911). Medications prescribed at discharge. Need for follow-up after discharge. Personal risk factors for stroke. Pneumonia vaccine given:  Flu vaccine given:  My questions have been answered, the writing is legible, and I understand these instructions.  I will adhere to these goals & educational materials that have been provided to me after my discharge from the hospital.    My questions have been answered and I understand these instructions. I will adhere to these goals and the provided educational materials after my discharge from the hospital.  Patient/Caregiver Signature _______________________________ Date __________  Clinician Signature _______________________________________ Date __________  Please bring this form and your medication list with you to all your follow-up doctor's appointments.                      Heart Healthy Consistent Carbohydrate Nutrition Therapy  A heart-healthy and consistent carbohydrate diet is recommended to manage heart disease and blood sugar.  To follow a heart-healthy and consistent carbohydrate diet,  Eat a balanced diet with whole grains, fruits and vegetables, and lean protein sources.  Choose heart-healthy unsaturated fats. Limit saturated fats, trans fats, and cholesterol intake. Eat more plant-based or vegetarian meals using beans and soy foods for protein.  Eat whole, unprocessed foods to limit the amount of sodium (salt) you eat.  Choose a consistent amount of carbohydrate at each meal and snack. Limit refined carbohydrates especially sugar, sweets and sugar-sweetened beverages.  If you drink alcohol, do so in moderation: one serving per day (women) and two servings per day (men). o  One serving is equivalent to 12 ounces beer, 5 ounces wine, or 1.5 ounces distilled spirits  Tips for Choosing Heart-Healthy Fats:  Choose lean protein and low-fat dairy foods to reduce saturated fat intake.  Saturated fat is usually found in animal-based protein and is associated with certain health risks. Saturated fat is the biggest contributor to raise low-density lipoprotein (LDL) cholesterol levels. Research shows that limiting saturated fat lowers unhealthy cholesterol levels. Eat no more than 7% of your total calories each day from saturated fat. Ask your RDN to help you determine how much saturated fat is right for you.  There are many foods that do not contain large amounts of saturated fats. Swapping these foods to replace foods high in saturated fats will help you limit the saturated fat you eat and improve your cholesterol levels. You  can also try eating more plant-based or vegetarian meals.  Instead of. Try:  Whole milk, cheese, yogurt, and ice cream 1% or skim milk, low-fat cheese, non-fat yogurt, and low-fat ice cream  Fatty, marbled beef and pork Lean beef, pork, or venison  Poultry with skin Poultry without skin  Butter, stick margarine Reduced-fat, whipped, or liquid spreads  Coconut oil, palm oil Liquid vegetable oils: corn, canola, olive, soybean and safflower oils   Avoid foods that contain trans fats.  Trans fats increase levels of LDL-cholesterol. Hydrogenated fat in processed foods is the main source of trans fats in foods.  Trans fats can be found in stick margarine, shortening, processed sweets, baked goods, some fried foods, and packaged foods made with hydrogenated oils. Avoid foods with "partially hydrogenated oil" on the ingredient list such as: cookies, pastries, baked goods, biscuits, crackers, microwave popcorn, and frozen dinners.  Choose foods with heart healthy fats.  Polyunsaturated and monounsaturated fat are unsaturated fats that may help lower your  blood cholesterol level when used in place of saturated fat in your diet.  Ask your RDN about taking a dietary supplement with plant sterols and stanols to help lower your cholesterol level.  Research shows that substituting saturated fats with unsaturated fats is beneficial to cholesterol levels. Try these easy swaps:  Instead of. Try:  Butter, stick margarine, or solid shortening Reduced-fat, whipped, or liquid spreads  Beef, pork, or poultry with skin  Fish and seafood  Chips, crackers, snack foods Raw or unsalted nuts and seeds or nut butters Hummus with vegetables Avocado on toast  Coconut oil, palm oil Liquid vegetable oils: corn, canola, olive, soybean and safflower oils   Limit the amount of cholesterol you eat to less than 200 milligrams per day.  Cholesterol is a substance carried through the bloodstream via lipoproteins, which are known as "transporters" of fat. Some body functions need cholesterol to work properly, but too much cholesterol in the bloodstream can damage arteries and build up blood vessel linings (which can lead to heart attack and stroke). You should eat less than 200 milligrams cholesterol per day.  People respond differently to eating cholesterol. There is no test available right now that can figure out which people will respond more to dietary cholesterol and which will respond less. For individuals with high intake of dietary cholesterol, different types of increase (none, small, moderate, large) in LDL-cholesterol levels are all possible.   Food sources of cholesterol include egg yolks and organ meats such as liver, gizzards. Limit egg yolks to two to four per week and avoid organ meats like liver and gizzards to control cholesterol intake.  Tips for Choosing Heart-Healthy Carbohydrates  Consume a consistent amount of carbohydrate  It is important to eat foods with carbohydrates in moderation because they impact your blood glucose level. Carbohydrates  can be found in many foods such as: ? Grains (breads, crackers, rice, pasta, and cereals) ? Starchy Vegetables (potatoes, corn, and peas) ? Beans and legumes ? Milk, soy milk, and yogurt ? Fruit and fruit juice ? Sweets (cakes, cookies, ice cream, jam and jelly)  Your RDN will help you set a goal for how many carbohydrate servings to eat at your meals and snacks. For many adults, eating 3 to 5 servings of carbohydrate foods at each meal and 1 or 2 carbohydrate servings for each snack works well.  Check your blood glucose level regularly. It can tell you if you need to adjust when you eat carbohydrates.  Choose  foods rich in viscous (soluble) fiber  Viscous, or soluble, is found in the walls of plant cells. Viscous fiber is found only in plant-based foods. Eating foods with fiber helps to lower your unhealthy cholesterol and keep your blood glucose in range ? Rich sources of viscous fiber include vegetables (asparagus, Brussels sprouts, sweet potatoes, turnips) fruit (apricots, mangoes, oranges), legumes, and whole grains (barley, oats, and oat bran).  As you increase your fiber intake gradually, also increase the amount of water you drink. This will help prevent constipation.  If you have difficulty achieving this goal, ask your RDN about fiber laxatives. Choose fiber supplements made with viscous fibers such as psyllium seed husks or methylcellulose to help lower unhealthy cholesterol.  Limit refined carbohydrates   There are three types of carbohydrates: starches, sugar, and fiber. Some carbohydrates occur naturally in food, like the starches in rice or corn or the sugars in fruits and milk. Refined carbohydrates--foods with high amounts of simple sugars--can raise triglyceride levels. High triglyceride levels are associated with coronary heart disease.  Some examples of refined carbohydrate foods are table sugar, sweets, and beverages sweetened with added sugar.  Tips for Reducing  Sodium (Salt) Although sodium is important for your body to function, too much sodium can be harmful for people with high blood pressure. As sodium and fluid buildup in your tissues and bloodstream, your blood pressure increases. High blood pressure may cause damage to other organs and increase your risk for a stroke. Even if you take a pill for blood pressure or a water pill (diuretic) to remove fluid, it is still important to have less salt in your diet. Ask your doctor and RDN what amount of sodium is right for you.  Avoid processed foods. Eat more fresh foods. ? Fresh fruits and vegetables are naturally low in sodium, as well as frozen vegetables and fruits that have no added juices or sauces. ? Fresh meats are lower in sodium than processed meats, such as bacon, sausage, and hotdogs. Read the nutrition label or ask your butcher to help you find a fresh meat that is low in sodium.  Eat less salt--at the table and when cooking. ? A single teaspoon of table salt has 2,300 mg of sodium. ? Leave the salt out of recipes for pasta, casseroles, and soups. ? Ask your RDN how to cook your favorite recipes without sodium  Be a smart shopper. ? Look for food packages that say "salt-free" or "sodium-free." These items contain less than 5 milligrams of sodium per serving. ? "Very low-sodium" products contain less than 35 milligrams of sodium per serving. ? "Low-sodium" products contain less than 140 milligrams of sodium per serving. ? Beware for "Unsalted" or "No Added Salt" products. These items may still be high in sodium. Check the nutrition label.  Add flavors to your food without adding sodium. ? Try lemon juice, lime juice, fruit juice or vinegar. ? Dry or fresh herbs add flavor. Try basil, bay leaf, dill, rosemary, parsley, sage, dry mustard, nutmeg, thyme, and paprika. ? Pepper, red pepper flakes, and cayenne pepper can add spice t your meals without adding sodium. Hot sauce contains sodium, but  if you use just a drop or two, it will not add up to much. ? Buy a sodium-free seasoning blend or make your own at home.  Additional Lifestyle Tips  Achieve and maintain a healthy weight.  Talk with your RDN or your doctor about what is a healthy weight for you.  Set  goals to reach and maintain that weight.  To lose weight, reduce your calorie intake along with increasing your physical activity. A weight loss of 10 to 15 pounds could reduce LDL-cholesterol by 5 milligrams per deciliter.  Participate in physical activity.  Talk with your health care team to find out what types of physical activity are best for you. Set a plan to get about 30 minutes of exercise on most days.  Food Group Foods Recommended  Grains Whole grain breads and cereals, including whole wheat, barley, rye, buckwheat, corn, teff, quinoa, millet, amaranth, brown or wild rice, sorghum, and oats Pasta, especially whole wheat or other whole grain types  [employer], quinoa or wild rice Whole grain crackers, bread, rolls, pitas Home-made bread with reduced-sodium baking soda  Protein Foods Lean cuts of beef and pork (loin, leg, round, extra lean hamburger)  Skinless Cytogeneticist and other wild game Dried beans and peas Nuts and nut butters Meat alternatives made with soy or textured vegetable protein  Egg whites or egg substitute Cold cuts made with lean meat or soy protein  Dairy Nonfat (skim), low-fat, or 1%-fat milk  Nonfat or low-fat yogurt or cottage cheese Fat-free and low-fat cheese  Vegetables Fresh, frozen, or canned vegetables without added fat or salt   Fruits Fresh, frozen, canned, or dried fruit   Oils Unsaturated oils (corn, olive, peanut, soy, sunflower, canola)  Soft or liquid margarines and vegetable oil spreads  Salad dressings Seeds and nuts  Avocado    Food Group Foods Not Recommended  Grains Breads or crackers topped with salt Cereals (hot or cold) with more than 300 mg  sodium per serving Biscuits, cornbread, and other "quick" breads prepared with baking soda Bread crumbs or stuffing mix from a store High-fat bakery products, such as doughnuts, biscuits, croissants, danish pastries, pies, cookies Instant cooking foods to which you add hot water and stir--potatoes, noodles, rice, etc. Packaged starchy foods--seasoned noodle or rice dishes, stuffing mix, macaroni and cheese dinner Snacks made with partially hydrogenated oils, including chips, cheese puffs, snack mixes, regular crackers, butter-flavored popcorn  Protein Foods Higher-fat cuts of meats (ribs, t-bone steak, regular hamburger) Bacon, sausage, or hot dogs Cold cuts, such as salami or bologna, deli meats, cured meats, corned beef Organ meats (liver, brains, gizzards, sweetbreads) Poultry with skin Fried or smoked meat, poultry, and fish Whole eggs and egg yolks (more than 2-4 per week) Salted legumes, nuts, seeds, or nut/seed butters Meat alternatives with high levels of sodium (>300 mg per serving) or saturated fat (>5 g per serving)  Dairy Whole milk,?2% fat milk, buttermilk Whole milk yogurt or ice cream Cream Half-&-half Cream cheese Sour cream Cheese  Vegetables Canned or frozen vegetables with salt, fresh vegetables prepared with salt, butter, cheese, or cream sauce Fried vegetables Pickled vegetables such as olives, pickles, or sauerkraut  Fruits Fried fruits Fruits served with butter or cream  Oils Butter, stick margarine, shortening Partially hydrogenated oils or trans fats Tropical oils (coconut, palm, palm kernel oils)  Other Candy, sugar sweetened soft drinks and desserts Salt, sea salt, garlic salt, and seasoning mixes containing salt Bouillon cubes Ketchup, barbecue sauce, Worcestershire sauce, soy sauce, teriyaki sauce Miso Salsa Pickles, olives, relish    Heart Healthy Consistent Carbohydrate Sample 1-Day Menu Breakfast 1 cup cooked oatmeal (2 carbohydrate  servings) 3/4 cup blueberries (1 carbohydrate serving) 1 ounce almonds 1 cup skim milk (1 carbohydrate serving) 1 cup coffee  Morning Snack 1 cup sugar-free nonfat yogurt (1 carbohydrate  serving)  Lunch 2 slices whole-wheat bread (2 carbohydrate servings) 2 ounces lean [place] breast 1 ounce low-fat Swiss cheese 1 teaspoon mustard 1 slice tomato 1 lettuce leaf 1 small pear (1 carbohydrate serving) 1 cup skim milk (1 carbohydrate serving)  Afternoon Snack 1 ounce trail mix with unsalted nuts, seeds, and raisins (1 carbohydrate serving)  Evening Meal 3 ounces salmon 2/3 cup cooked brown rice (2 carbohydrate servings) 1 teaspoon soft margarine 1 cup cooked broccoli with 1/2 cup cooked carrots (1 carbohydrate serving Carrots, cooked, boiled, drained, without salt 1 cup lettuce 1 teaspoon olive oil with vinegar for dressing 1 small whole grain roll (1 carbohydrate serving) 1 teaspoon soft margarine 1 cup unsweetened tea  Evening Snack 1 extra-small banana (1 carbohydrate serving)

## 2019-07-14 NOTE — Plan of Care (Signed)
  Problem: Consults Goal: RH STROKE PATIENT EDUCATION Description See Patient Education module for education specifics  Outcome: Progressing Goal: Nutrition Consult-if indicated Outcome: Progressing Goal: Diabetes Guidelines if Diabetic/Glucose > 140 Description If diabetic or lab glucose is > 140 mg/dl - Initiate Diabetes/Hyperglycemia Guidelines & Document Interventions  Outcome: Progressing   

## 2019-07-15 MED ORDER — CLOPIDOGREL BISULFATE 75 MG PO TABS: 75.0000 mg | ORAL_TABLET | Freq: Every day | ORAL | 2 refills | Status: DC

## 2019-07-15 MED ORDER — ROSUVASTATIN CALCIUM 40 MG PO TABS: 40.0000 mg | ORAL_TABLET | Freq: Every day | ORAL | 0 refills | Status: DC

## 2019-07-15 MED ORDER — PANTOPRAZOLE SODIUM 40 MG PO TBEC: 40.0000 mg | DELAYED_RELEASE_TABLET | Freq: Every day | ORAL | 0 refills | Status: DC

## 2019-07-15 NOTE — Plan of Care (Signed)
  Problem: RH SAFETY Goal: RH STG ADHERE TO SAFETY PRECAUTIONS W/ASSISTANCE/DEVICE Description: STG Adhere to Safety Precautions With cues/reminders/supervision-Assistance/Device. Outcome: Progressing Goal: RH STG DECREASED RISK OF FALL WITH ASSISTANCE Description: STG Decreased Risk of Fall With cues/reminders/supervision-Assistance. Outcome: Progressing   Problem: RH SAFETY Goal: RH STG ADHERE TO SAFETY PRECAUTIONS W/ASSISTANCE/DEVICE Description: STG Adhere to Safety Precautions With cues/reminders/supervision-Assistance/Device. Outcome: Progressing

## 2019-07-15 NOTE — Progress Notes (Signed)
Inpatient Rehabilitation Care Coordinator  Discharge Note  The overall goal for the admission was met for:   Discharge location: Yes, Home  Length of Stay: Yes, 8 Days  Discharge activity level: Yes, MOD I  Home/community participation: Yes  Services provided included: MD, RD, PT, OT, SLP, RN, CM, TR, Pharmacy and [place]: Medicare  Follow-up services arranged: Home Health: [facility]  Comments (or additional information): PT OT St  Patient/Family verbalized understanding of follow-up arrangements: Yes  Individual responsible for coordination of the follow-up plan: sandra [phone]  Confirmed correct DME delivered: Dyanne Iha 07/15/2019    Dyanne Iha

## 2019-07-15 NOTE — Progress Notes (Signed)
[facility] PHYSICAL MEDICINE & REHABILITATION PROGRESS NOTE   Subjective/Complaints:  Excited about going home, discussed MD f/u and meds ROS: Denies CP, SOB, N/V/D  Objective:   No results found. No results for input(s): WBC, HGB, HCT, PLT in the last 72 hours. No results for input(s): NA, K, CL, CO2, GLUCOSE, BUN, CREATININE, CALCIUM in the last 72 hours.  Intake/Output Summary (Last 24 hours) at 07/15/2019 0902 Last data filed at 07/15/2019 0755 Gross per 24 hour  Intake 760 ml  Output 400 ml  Net 360 ml     Physical Exam: Vital Signs Blood pressure 104/69, pulse 68, temperature 97.7 F (36.5 C), temperature source Oral, resp. rate 18, height 5\' 6"  (1.676 m), weight 72.6 kg, SpO2 96 %.    General: No acute distress Mood and affect are appropriate Heart: Regular rate and rhythm no rubs murmurs or extra sounds Lungs: Clear to auscultation, breathing unlabored, no rales or wheezes Abdomen: Positive bowel sounds, soft nontender to palpation, nondistended Extremities: No clubbing, cyanosis, or edema  Sensory exam intact LT but poor functional position sense with ambulation   FNFMusculoskeletal: Full range of motion in all 4 extremities. No joint swelling  Assessment/Plan: 1. Functional deficits secondary to Right thalamic Stable for D/C today F/u PCP in 3-4 weeks F/u PM&R 2 weeks See D/C summary  See D/C instructions  Care Tool:  Bathing    Body parts bathed by patient: Right arm, Left arm, Chest, Abdomen, Front perineal area, Buttocks, Right upper leg, Left upper leg, Right lower leg, Left lower leg, Face         Bathing assist Assist Level: Supervision/Verbal cueing     Upper Body Dressing/Undressing Upper body dressing   What is the patient wearing?: Pull over shirt    Upper body assist Assist Level: Independent with assistive device    Lower Body Dressing/Undressing Lower body dressing      What is the patient wearing?: Pants, Underwear/pull up      Lower body assist Assist for lower body dressing: Independent with assitive device Assistive Device Comment: RW for occasional UE support   Toileting Toileting Toileting Activity did not occur (Clothing management and hygiene only): N/A (no void or bm)  Toileting assist Assist for toileting: Independent with assistive device Assistive Device Comment: with RW   Transfers Chair/bed transfer  Transfers assist     Chair/bed transfer assist level: Independent with assistive device     Locomotion Ambulation   Ambulation assist      Assist level: Independent with assistive device Assistive device: Walker-rolling Max distance: 180   Walk 10 feet activity   Assist     Assist level: Independent with assistive device Assistive device: Walker-rolling   Walk 50 feet activity   Assist    Assist level: Independent with assistive device Assistive device: Walker-rolling    Walk 150 feet activity   Assist    Assist level: Independent with assistive device Assistive device: Walker-rolling    Walk 10 feet on uneven surface  activity   Assist     Assist level: Supervision/Verbal cueing Assistive device: Aeronautical engineer Will patient use wheelchair at discharge?: No   Wheelchair activity did not occur: N/A         Wheelchair 50 feet with 2 turns activity    Assist    Wheelchair 50 feet with 2 turns activity did not occur: N/A       Wheelchair 150 feet activity  Assist  Wheelchair 150 feet activity did not occur: N/A       Blood pressure 104/69, pulse 68, temperature 97.7 F (36.5 C), temperature source Oral, resp. rate 18, height 5\' 6"  (1.676 m), weight 72.6 kg, SpO2 96 %.  Medical Problem List and Plan: 1.    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  Antithrombotics: -DVT/anticoagulation:  Pharmaceutical: Lovenox              -antiplatelet therapy: DAPT 3. Pain Management: Tylenol prn.  4. Mood: LCSW to follow for evaluation and support.              -antipsychotic agents: N/A 5. Neuropsych: This patient is capable of making decisions on his own behalf. 6. Skin/Wound Care:  Routine pressure relief measures.  7. Fluids/Electrolytes/Nutrition: Monitor I/O.  8. 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 HTN: Monitor BP   On no anti-hypertensives at present Vitals:   07/14/19 1937 07/15/19 0541  BP: 108/74 104/69  Pulse: 74 68  Resp:    Temp: 97.6 F (36.4 C) 97.7 F (36.5 C)  SpO2: 98% 96%   Controlled on 6/10 10.  Dyslipidemia: LDL-131. Now on Crestor.  11. Hypokalemia:Resolved  Supplemented X 2 on 6/2  Potassium 4.1 on 6/4, labs 6/7 K+ 3.8 12. H/o GERD/esophagitis: Continue protonix- used omeprazole at home, but needs Protonix since doesn't interact with Plavix.  13.  AKI resolved   LOS: 8 days A FACE TO FACE EVALUATION WAS PERFORMED  Charlett Blake 07/15/2019, 9:02 AM

## 2019-07-19 DIAGNOSIS — Z8673 Personal history of transient ischemic attack (TIA), and cerebral infarction without residual deficits: Secondary | ICD-10-CM | POA: Insufficient documentation

## 2019-07-19 DIAGNOSIS — I6521 Occlusion and stenosis of right carotid artery: Secondary | ICD-10-CM | POA: Insufficient documentation

## 2019-07-19 NOTE — Discharge Summary (Signed)
Physician Discharge Summary  [name] MRN: [id number] DOB/AGE: [birth year] 75 y.o.  Admit date: 07/07/2019 Discharge date: 07/15/2019  Discharge Diagnoses:  Principal Problem:   Stroke [facility]) Active Problems:   Decreased GFR   Dyslipidemia   Hemiparesis affecting left side as late effect of stroke [facility])   Discharged Condition: stable   Significant Diagnostic Studies:   Labs:  Basic Metabolic Panel: BMP Latest Ref Rng & Units 07/11/2019 07/08/2019 07/05/2019  Glucose 70 - 99 mg/dL 134(H) 105(H) 122(H)  BUN 8 - 23 mg/dL 16 21 22   Creatinine 0.61 - 1.24 mg/dL 1.17 1.20 1.19  Sodium 135 - 145 mmol/L 136 138 139  Potassium 3.5 - 5.1 mmol/L 3.8 4.1 3.2(L)  Chloride 98 - 111 mmol/L 102 103 101  CO2 22 - 32 mmol/L 24 23 27   Calcium 8.9 - 10.3 mg/dL 9.4 9.4 9.3    CBC: CBC Latest Ref Rng & Units 07/11/2019 07/08/2019 07/05/2019  WBC 4.0 - 10.5 K/uL 7.9 9.9 7.7  Hemoglobin 13.0 - 17.0 g/dL 15.2 15.7 15.5  Hematocrit 39 - 52 % 43.7 44.5 45.0  Platelets 150 - 400 K/uL 280 253 283    CBG: No results for input(s): GLUCAP in the last 168 hours.  Brief HPI:   Russell Herman is a 74 y.o. RH- male with history of HTN, GERD, BCC who was admitted to [facility] on 07/05/19 after waking up with left sided weakness, numbness and difficulty walking.  CT head was negative for acute changes.  MRI brain revealed small acute infarct in right lateral thalamus and mild small vessel disease.  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  Stroke was felt to be due to small vessel disease and neurology recommending DAPT x3 months.  Dr. Lucky Cowboy was consulted for input on CAS and plans for surgical intervention on outpatient basis.  Therapy has been ongoing and patient continues to be limited by left-sided weakness with left knee instability and decrease in coordination of LUE.  CIR was recommended due to impairments in mobility  and ADLs.   Hospital Course: [name] was admitted to rehab 07/07/2019 for inpatient therapies to consist of PT and OT at least three hours five days a week. Past admission physiatrist, therapy team and rehab RN have worked together to provide customized collaborative inpatient rehab. His blood pressures were monitored on TID basis and have been stable.  Hypokalemia has resolved with brief supplementation. Follow up labs revealed that lytes and renal status is WNL.  He continues on protonix to manage GERD symptoms. He was maintained on DAPT and follow up CBC showed that H/H/Platelets are stable. He is continent of B/B. Dysmetria LUE is improving. He has made good gains and is modified independent at discharge. He will continue to receive follow up HHPT and [place] [facility] after discharge.     Rehab course: During patient's stay in rehab weekly team conference was held to monitor patient's progress, set goals and discuss barriers to discharge. At admission, patient required min assist with mobility and basic ADLs.  He has had improvement in activity tolerance, balance, postural control as well as ability to compensate for deficits. He has had improvement in functional use RUE  and LLE as well as improvement in awareness. He requires supervision with LB bathing and is able to complete all other ADL tasks at modified independent level. He is modified independent for transfers  and to ambulate 180' with RW. Family education completed regarding all aspects of care and safety.    Disposition:  Home.  Diet: Heart Healthy  Special Instructions: 1. No driving till cleared by MD. 2. Follow up with vascular for input on stopping Plavix.   Discharge Instructions    Ambulatory referral to Physical Medicine Rehab   Complete by: As directed    1-2 weeks TC appt     Allergies as of 07/15/2019      Reactions   Losartan Other (See Comments)   rash   Chlorthalidone Nausea And Vomiting   Can  tolerate if taking with Omeprazole   Keflex [cephalexin]    "minor reaction"   Statins Rash   Myalgias, muscle pain/weakness   Sulfa Antibiotics Rash   Sulfamethoxazole-trimethoprim Rash      Medication List    STOP taking these medications   chlorthalidone 25 MG tablet Commonly known as: HYGROTON     TAKE these medications   aspirin 81 MG EC tablet Take 1 tablet (81 mg total) by mouth daily.   clopidogrel 75 MG tablet Commonly known as: PLAVIX Take 1 tablet (75 mg total) by mouth daily.   fluticasone 50 MCG/ACT nasal spray Commonly known as: FLONASE Place 2 sprays into both nostrils daily.   montelukast 10 MG tablet Commonly known as: SINGULAIR Take 10 mg by mouth at bedtime.   pantoprazole 40 MG tablet Commonly known as: PROTONIX Take 1 tablet (40 mg total) by mouth daily.   rosuvastatin 40 MG tablet Commonly known as: CRESTOR Take 1 tablet (40 mg total) by mouth at bedtime.   Vitamin D-3 25 MCG (1000 UT) Caps Take 1,000 Units by mouth daily at 12 noon.       Follow-up Information    Kirsteins, Luanna Salk, MD Follow up.   Specialty: Physical Medicine and Rehabilitation Why: Office will call you with follow up appt Contact information: [place] [address] [phone]        Algernon Huxley, MD Follow up.   Specialties: Vascular Surgery, Radiology, Interventional Cardiology Why: Keep follow up appointment Contact information: [address] [place] [address] [phone]        [place]. Call on 07/18/2019.   Why: keep follow up appointment Contact information: [place] [phone]       Valera Castle, MD. Call on 07/18/2019.   Specialty: Family Medicine Why: for  post hospital appt Contact information: [place] [address] [phone]               Signed: Bary Leriche 07/20/2019, 10:50 PM

## 2019-07-28 ENCOUNTER — Encounter: Payer: Medicare Other | Admitting: Registered Nurse

## 2019-07-29 ENCOUNTER — Encounter (INDEPENDENT_AMBULATORY_CARE_PROVIDER_SITE_OTHER): Payer: Self-pay | Admitting: Vascular Surgery

## 2019-07-29 ENCOUNTER — Ambulatory Visit (INDEPENDENT_AMBULATORY_CARE_PROVIDER_SITE_OTHER): Payer: Medicare Other | Admitting: Vascular Surgery

## 2019-07-29 ENCOUNTER — Other Ambulatory Visit: Payer: Self-pay

## 2019-07-29 VITALS — BP 127/75 | HR 73 | Resp 20 | Ht 66.0 in | Wt 158.0 lb

## 2019-07-29 DIAGNOSIS — I63331 Cerebral infarction due to thrombosis of right posterior cerebral artery: Secondary | ICD-10-CM

## 2019-07-29 DIAGNOSIS — I6529 Occlusion and stenosis of unspecified carotid artery: Secondary | ICD-10-CM | POA: Insufficient documentation

## 2019-07-29 DIAGNOSIS — I10 Essential (primary) hypertension: Secondary | ICD-10-CM | POA: Diagnosis not present

## 2019-07-29 DIAGNOSIS — I6521 Occlusion and stenosis of right carotid artery: Secondary | ICD-10-CM | POA: Diagnosis present

## 2019-07-29 DIAGNOSIS — E782 Mixed hyperlipidemia: Secondary | ICD-10-CM

## 2019-07-29 DIAGNOSIS — I639 Cerebral infarction, unspecified: Secondary | ICD-10-CM

## 2019-07-29 NOTE — Patient Instructions (Signed)
Carotid Endarterectomy A carotid endarterectomy is a surgery to remove a blockage in the carotid arteries. The carotid arteries are the large blood vessels on both sides of the neck that supply blood to the brain. Carotid artery disease, also called carotid artery stenosis, is the narrowing or blockage of one or both carotid arteries. Carotid artery disease is usually caused by atherosclerosis, which is a buildup of fat and plaque in the arteries. Some buildup of plaque normally occurs with aging. The plaque may partially or totally block blood flow or cause a clot to form in the carotid arteries. This may cause a stroke. Tell a health care provider about:  Any allergies you have.  All medicines you are taking, including vitamins, herbs, eye drops, creams, and over-the-counter medicines.  Any problems you or family members have had with anesthetic medicines.  Any blood disorders you have.  Any surgeries you have had.  Any medical conditions you have, or have had, including diabetes, kidney problems, and infections.  Whether you are pregnant or may be pregnant. What are the risks? Generally, this is a safe procedure. However, problems may occur, including:  Infection.  Bleeding.  Blood clots.  Allergic reactions to medicines.  Damage to nerves near the carotid arteries. This can cause a hoarse voice or weakness of muscles in your face.  Stroke.  Seizures.  Heart attack (myocardial infarction).  Narrowing of the opened blood vessel (restenosis). This may require another surgery. What happens before the procedure? Staying hydrated Follow instructions from your health care provider about hydration, which may include:  Up to 2 hours before the procedure - you may continue to drink clear liquids, such as water, clear fruit juice, black coffee, and plain tea.  Eating and drinking restrictions Follow instructions from your health care provider about eating and drinking, which may  include:  8 hours before the procedure - stop eating heavy meals or foods, such as meat, fried foods, or fatty foods.  6 hours before the procedure - stop eating light meals or foods, such as toast or cereal.  6 hours before the procedure - stop drinking milk or drinks that contain milk.  2 hours before the procedure - stop drinking clear liquids. Medicines  Ask your health care provider about: ? Changing or stopping your regular medicines. This is especially important if you are taking diabetes medicines or blood thinners. ? Taking medicines such as aspirin and ibuprofen. These medicines can thin your blood. Do not take these medicines unless your health care provider tells you to take them. ? Taking over-the-counter medicines, vitamins, herbs, and supplements. General instructions  Do not use any products that contain nicotine or tobacco for at least 4-6 weeks, or as soon as possible, before the procedure. These products include cigarettes, e-cigarettes, and chewing tobacco. If you need help quitting, ask your health care provider.  You may need to have blood tests, a test to check heart rhythm (electrocardiogram), or a test to check blood flow (angiogram).  Plan to have someone take you home from the hospital or clinic.  Ask your health care provider: ? How your surgery site will be marked. ? What steps will be taken to help prevent infection. These may include:  Removing hair at the surgery site.  Washing skin with a germ-killing soap.  Taking antibiotic medicine. What happens during the procedure?   An IV will be inserted into one of your veins.  You will be given one or more of the following: ?  A medicine to help you relax (sedative). ? A medicine to make you fall asleep (general anesthetic).  The surgeon will make a small incision in your neck to expose the carotid artery.  A tube may be inserted into the carotid artery above and below the blockage. This tube will  allow blood to flow around the blockage during the surgery.  An incision will be made in the carotid artery at the location of the blockage.  The blockage will be removed. In some cases, a section of the carotid artery may be removed and a graft patch may be used to repair the artery.  The carotid artery will be closed with stitches (sutures).  If a tube was inserted into the artery to allow blood flow around the blockage during surgery, the tube will be removed. Once the tube is removed, blood flow through the carotid artery will be restored.  The incision in the neck will be closed with sutures.  A bandage (dressing) will be placed over your incision. The procedure may vary among health care providers and hospitals. What happens after the procedure?  Your blood pressure, heart rate, breathing rate, and blood oxygen level will be monitored until you leave the hospital or clinic.  You may have some pain or an ache in your neck for about 2 weeks. This is normal.  Do not drive for 24 hours if you were given a sedative during your procedure. Summary  A carotid endarterectomy is a surgery to remove a blockage in the carotid arteries.  The carotid arteries are the large blood vessels on both sides of the neck that supply blood to the brain.  Before the procedure, ask your health care provider about changing or stopping your regular medicines.  Follow instructions from your health care provider about eating and drinking before the procedure.  After the procedure, do not drive for 24 hours if you were given a sedative. This information is not intended to replace advice given to you by your health care provider. Make sure you discuss any questions you have with your health care provider. Document Revised: 09/29/2017 Document Reviewed: 09/29/2017 Elsevier Patient Education  2020 [employer].

## 2019-07-29 NOTE — Assessment & Plan Note (Signed)
lipid control important in reducing the progression of atherosclerotic disease. Continue statin therapy  

## 2019-07-29 NOTE — Progress Notes (Signed)
[name], male   DOB: Jan 07, 1946, 74 y.o.   MRN: [id number]  Chief Complaint  Patient presents with  . Follow-up    HPI Russell Herman is a 74 y.o. male.  I am seeing the patient today after he was seen by our 31 assistant in the hospital about 3 weeks ago for right carotid artery stenosis with stroke.  He got inpatient rehab which he did well with.  He is now at home.  He is able to walk but is still using a walker and does still have some left leg weakness and numbness.  He underwent a CT angiogram of the carotid arteries while he was in the hospital which I have independently reviewed.  This demonstrates what was officially read is a 78% right ICA stenosis but appears to be worse than that.  It is densely calcific and the anatomy is not favorable for carotid stenting.  It was recommended he consider right carotid endarterectomy but we wanted to have 2 to 3 weeks after his stroke to allow healing.  He is here today to discuss surgery.   Past Medical History:  Diagnosis Date  . Adenomatous colon polyp   . Allergy   . Arthritis   . Cancer ([place])   . GERD (gastroesophageal reflux disease)   . Hypertension   . Wears hearing aid in both ears     Past Surgical History:  Procedure Laterality Date  . cyst removed    . EXCISION OF TONGUE LESION Bilateral 05/05/2019   Procedure: EXCISION OF DORSAL TONGUE LESION;  Surgeon: Margaretha Sheffield, MD;  Location: [facility];  Service: ENT;  Laterality: Bilateral;  . skin cancer removed       Family History  Problem Relation Age of Onset  . Breast cancer Mother   . Melanoma Father        mets to lung  . Breast cancer Sister   . Kidney failure Brother        s/p transplant     Social History   Tobacco Use  . Smoking status: Never Smoker  . Smokeless tobacco: Never Used  . Tobacco comment: smoked "some" as teenager  Vaping Use  . Vaping Use: Never used  Substance Use Topics  . Alcohol use: Not  Currently    Alcohol/week: 0.0 standard drinks  . Drug use: Not on file    Allergies  Allergen Reactions  . Losartan Other (See Comments)    rash  . Chlorthalidone Nausea And Vomiting    Can tolerate if taking with Omeprazole  . Keflex [Cephalexin]     "minor reaction"  . Statins Rash    Myalgias, muscle pain/weakness  . Sulfa Antibiotics Rash  . Sulfamethoxazole-Trimethoprim Rash    Current Outpatient Medications  Medication Sig Dispense Refill  . aspirin EC 81 MG EC tablet Take 1 tablet (81 mg total) by mouth daily. 30 tablet 0  . Cholecalciferol (VITAMIN D-3) 1000 UNITS CAPS Take 1,000 Units by mouth daily at 12 noon.     . clopidogrel (PLAVIX) 75 MG tablet Take 1 tablet (75 mg total) by mouth daily. 30 tablet 2  . montelukast (SINGULAIR) 10 MG tablet Take 10 mg by mouth at bedtime.    . pantoprazole (PROTONIX) 40 MG tablet Take 1 tablet (40 mg total) by mouth daily. 30 tablet 0  . rosuvastatin (CRESTOR) 40 MG tablet Take 1 tablet (40 mg total) by mouth at bedtime. 30 tablet 0  .  fluticasone (FLONASE) 50 MCG/ACT nasal spray Place 2 sprays into both nostrils daily.  (Patient not taking: Reported on 07/29/2019)  4   No current facility-administered medications for this visit.      REVIEW OF SYSTEMS (Negative unless checked)  Constitutional: [] Weight loss  [] Fever  [] Chills Cardiac: [] Chest pain   [] Chest pressure   [] Palpitations   [] Shortness of breath when laying flat   [] Shortness of breath at rest   [] Shortness of breath with exertion. Vascular:  [] Pain in legs with walking   [] Pain in legs at rest   [] Pain in legs when laying flat   [] Claudication   [] Pain in feet when walking  [] Pain in feet at rest  [] Pain in feet when laying flat   [] History of DVT   [] Phlebitis   [] Swelling in legs   [] Varicose veins   [] Non-healing ulcers Pulmonary:   [] Uses home oxygen   [] Productive cough   [] Hemoptysis   [] Wheeze  [] COPD   [] Asthma Neurologic:  [] Dizziness  [] Blackouts    [] Seizures   [x] History of stroke   [] History of TIA  [] Aphasia   [] Temporary blindness   [] Dysphagia   [] Weakness or numbness in arms   [x] Weakness or numbness in legs Musculoskeletal:  [x] Arthritis   [] Joint swelling   [] Joint pain   [] Low back pain Hematologic:  [] Easy bruising  [] Easy bleeding   [] Hypercoagulable state   [] Anemic  [] Hepatitis Gastrointestinal:  [] Blood in stool   [] Vomiting blood  [x] Gastroesophageal reflux/heartburn   [] Abdominal pain Genitourinary:  [] Chronic kidney disease   [] Difficult urination  [] Frequent urination  [] Burning with urination   [] Hematuria Skin:  [] Rashes   [] Ulcers   [] Wounds Psychological:  [] History of anxiety   []  History of major depression.    Physical Exam BP 127/75 (BP Location: Right Arm)   Pulse 73   Resp 20   Ht 5\' 6"  (1.676 m)   Wt 158 lb (71.7 kg)   BMI 25.50 kg/m  Gen:  WD/WN, NAD Head: [place]/AT, No temporalis wasting. Ear/Nose/Throat: Hearing diminished, nares w/o erythema or drainage, oropharynx w/o Erythema/Exudate Eyes: Conjunctiva clear, sclera non-icteric  Neck: trachea midline.  No JVD.  Pulmonary:  Good air movement, respirations not labored, no use of accessory muscles  Cardiac: RRR, no JVD Vascular:  Vessel Right Left  Radial Palpable Palpable                                   Gastrointestinal:. No masses, surgical incisions, or scars. Musculoskeletal: Mild left leg weakness.  Extremities without ischemic changes.  No deformity or atrophy.  Walks with a walker edema. Neurologic: Sensation grossly intact in extremities.  Some left leg weakness.  Speech is fluent.  Psychiatric: Judgment intact, Mood & affect appropriate for pt's clinical situation. Dermatologic: No rashes or ulcers noted.  No cellulitis or open wounds.    Radiology CT HEAD WO CONTRAST  Result Date: 07/05/2019 CLINICAL DATA:  Dizziness EXAM: CT HEAD WITHOUT CONTRAST TECHNIQUE: Contiguous axial images were obtained from the base of the skull  through the vertex without intravenous contrast. COMPARISON:  None. FINDINGS: Brain: There is no acute intracranial hemorrhage, mass effect, or edema. Gray-white differentiation is preserved. There is no extra-axial fluid collection. Ventricles and sulci are within normal limits in size and configuration. Minimal patchy hypoattenuation in the supratentorial white matter is nonspecific but may reflect minor chronic microvascular ischemic changes. Vascular: No hyperdense vessel or unexpected calcification.  Skull: Calvarium is unremarkable. Sinuses/Orbits: No acute finding. Other: None. IMPRESSION: No acute intracranial hemorrhage, mass effect, or evidence of acute infarction. Electronically Signed   By: Macy Mis M.D.   On: 07/05/2019 08:20   CT ANGIO NECK W OR WO CONTRAST  Result Date: 07/06/2019 CLINICAL DATA:  Carotid stenosis.  Right thalamic stroke. EXAM: CT ANGIOGRAPHY NECK TECHNIQUE: Multidetector CT imaging of the neck was performed using the standard protocol during bolus administration of intravenous contrast. Multiplanar CT image reconstructions and MIPs were obtained to evaluate the vascular anatomy. Carotid stenosis measurements (when applicable) are obtained utilizing NASCET criteria, using the distal internal carotid diameter as the denominator. CONTRAST:  76mL OMNIPAQUE IOHEXOL 350 MG/ML SOLN COMPARISON:  Carotid Doppler 07/05/2019 FINDINGS: Aortic arch: Standard branching. Imaged portion shows no evidence of aneurysm or dissection. No significant stenosis of the major arch vessel origins. Mild atherosclerotic calcification aortic arch. Right carotid system: Calcified and noncalcified plaque right carotid bifurcation. Minimal luminal diameter on the sagittal view is 0.86 mm corresponding to 78% diameter stenosis. No intraluminal filling defect. Remainder of the right internal carotid artery is patent. Left carotid system: Mild atherosclerotic disease left carotid bifurcation without stenosis.  Vertebral arteries: Both vertebral arteries patent to the basilar. Mild stenosis proximal right vertebral artery. No significant left vertebral stenosis. Skeleton: Cervical spondylosis diffusely. No focal or acute skeletal abnormality. Other neck: Negative for mass or adenopathy in the neck. Upper chest: Lung apices clear bilaterally. IMPRESSION: 1. 78% diameter stenosis proximal right internal carotid artery due to calcified and noncalcified atherosclerotic plaque. 2. No significant left carotid stenosis 3. Mild stenosis origin of right vertebral artery. Left vertebral artery widely patent. Electronically Signed   By: Franchot Gallo M.D.   On: 07/06/2019 11:36   MR BRAIN WO CONTRAST  Result Date: 07/05/2019 CLINICAL DATA:  Acute presentation with left-sided numbness and tingling affecting the leg. EXAM: MRI HEAD WITHOUT CONTRAST TECHNIQUE: Multiplanar, multiecho pulse sequences of the brain and surrounding structures were obtained without intravenous contrast. COMPARISON:  Head CT same day FINDINGS: Brain: Diffusion imaging shows a small area of acute infarction affecting the lateral thalamus on the right. No sign of swelling or hemorrhage. No other acute finding. Elsewhere, there are mild chronic small-vessel changes of the hemispheric white matter. No large vessel territory infarction. No mass lesion, hemorrhage, hydrocephalus or extra-axial collection. Vascular: Major vessels at the base of the brain show flow. Skull and upper cervical spine: Negative Sinuses/Orbits: Clear/normal Other: None IMPRESSION: Small area of acute infarction in the lateral right thalamus. Mild chronic small-vessel change of the white matter otherwise. No hemorrhage or mass effect. Electronically Signed   By: Nelson Chimes M.D.   On: 07/05/2019 13:39   US Carotid Bilateral (at [facility] and [facility] only)  Result Date: 07/05/2019 CLINICAL DATA:  Carotid artery disease. Acute infarction involving the right lateral thalamus. EXAM: BILATERAL  CAROTID DUPLEX ULTRASOUND TECHNIQUE: Pearline Cables scale imaging, color Doppler and duplex ultrasound were performed of bilateral carotid and vertebral arteries in the neck. COMPARISON:  None. FINDINGS: Criteria: Quantification of carotid stenosis is based on velocity parameters that correlate the residual internal carotid diameter with NASCET-based stenosis levels, using the diameter of the distal internal carotid lumen as the denominator for stenosis measurement. The following velocity measurements were obtained: RIGHT ICA: 247/58 cm/sec CCA: 16/10 cm/sec SYSTOLIC ICA/CCA RATIO:  3.5 ECA: 150 cm/sec LEFT ICA: 100/33 cm/sec CCA: 96/04 cm/sec SYSTOLIC ICA/CCA RATIO:  1.6 ECA: 85 cm/sec RIGHT CAROTID ARTERY: Echogenic and heterogeneous plaque at  the right carotid bulb. External carotid artery is patent with normal waveform. Irregular heterogeneous plaque at the proximal aspect of the internal carotid artery. Evidence for stenosis and turbulent flow in the proximal internal carotid artery. Elevated peak systolic velocity in the proximal internal carotid artery measures 247 cm/sec and end-diastolic velocity measures 58 cm/sec. Mid and distal right internal carotid artery are patent. RIGHT VERTEBRAL ARTERY: Antegrade flow and normal waveform in the right vertebral artery. LEFT CAROTID ARTERY: Mild atherosclerotic disease at the left carotid bulb. Minimal plaque near the proximal external carotid artery. External carotid artery is patent with normal waveform. Small amount of plaque in the proximal internal carotid artery. Normal waveforms and velocities in the internal carotid artery. LEFT VERTEBRAL ARTERY: Antegrade flow and normal waveform in the left vertebral artery. IMPRESSION: 1. Atherosclerotic plaque involving the right carotid bulb and proximal right internal carotid artery. Estimated degree of stenosis in the right internal carotid artery is greater than 70%. 2. Mild plaque in the left carotid arteries. Estimated degree  of stenosis in left internal carotid artery is less than 50%. 3. Patent vertebral arteries with antegrade flow. Electronically Signed   By: Markus Daft M.D.   On: 07/05/2019 15:34   ECHOCARDIOGRAM COMPLETE BUBBLE STUDY  Result Date: 07/06/2019    ECHOCARDIOGRAM REPORT   Patient Name:   ALDYN TOON Date of Exam: 07/06/2019 Medical Rec #:  [id number]       Height:       66.0 in Accession #:    [id number]      Weight:       158.7 lb Date of Birth:  [birth year]        BSA:          1.813 m Patient Age:    9 years        BP:           129/74 mmHg Patient Gender: M               HR:           73 bpm. Exam Location:  [facility] Procedure: 2D Echo, Cardiac Doppler, Color Doppler and Saline Contrast Bubble            Study Indications:     Stroke 434.91  History:         Patient has no prior history of Echocardiogram examinations.                  Risk Factors:Hypertension.  Sonographer:     Sherrie Sport RDCS (AE) Referring Phys:  [id number] LESLIE REYNOLDS Diagnosing Phys: Kate Sable MD  Sonographer Comments: Technically challenging study due to limited acoustic windows. The best view is from the subcostal. IMPRESSIONS  1. Left ventricular ejection fraction, by estimation, is 65 to 70%. The left ventricle has normal function. The left ventricle has no regional wall motion abnormalities. Left ventricular diastolic parameters are consistent with Grade I diastolic dysfunction (impaired relaxation).  2. Right ventricular systolic function is normal. The right ventricular size is normal. There is normal pulmonary artery systolic pressure.  3. The mitral valve is normal in structure. No evidence of mitral valve regurgitation. No evidence of mitral stenosis.  4. The aortic valve is normal in structure. Aortic valve regurgitation is not visualized. No aortic stenosis is present.  5. The inferior vena cava is normal in size with greater than 50% respiratory variability, suggesting right atrial pressure of 3 mmHg.  6. Agitated saline  contrast bubble study  was negative, with no evidence of any interatrial shunt. FINDINGS  Left Ventricle: Left ventricular ejection fraction, by estimation, is 65 to 70%. The left ventricle has normal function. The left ventricle has no regional wall motion abnormalities. The left ventricular internal cavity size was normal in size. There is  no left ventricular hypertrophy. Left ventricular diastolic parameters are consistent with Grade I diastolic dysfunction (impaired relaxation). Right Ventricle: The right ventricular size is normal. No increase in right ventricular wall thickness. Right ventricular systolic function is normal. There is normal pulmonary artery systolic pressure. The tricuspid regurgitant velocity is 2.44 m/s, and  with an assumed right atrial pressure of 3 mmHg, the estimated right ventricular systolic pressure is 27.0 mmHg. Left Atrium: Left atrial size was normal in size. Right Atrium: Right atrial size was normal in size. Pericardium: There is no evidence of pericardial effusion. Mitral Valve: The mitral valve is normal in structure. Normal mobility of the mitral valve leaflets. No evidence of mitral valve regurgitation. No evidence of mitral valve stenosis. Tricuspid Valve: The tricuspid valve is normal in structure. Tricuspid valve regurgitation is trivial. No evidence of tricuspid stenosis. Aortic Valve: The aortic valve is normal in structure. Aortic valve regurgitation is not visualized. No aortic stenosis is present. Aortic valve mean gradient measures 3.5 mmHg. Aortic valve peak gradient measures 5.5 mmHg. Aortic valve area, by VTI measures 2.92 cm. Pulmonic Valve: The pulmonic valve was not well visualized. Pulmonic valve regurgitation is not visualized. No evidence of pulmonic stenosis. Aorta: The aortic root is normal in size and structure. Venous: The inferior vena cava is normal in size with greater than 50% respiratory variability, suggesting right atrial pressure of 3 mmHg.  IAS/Shunts: No atrial level shunt detected by color flow Doppler. Agitated saline contrast was given intravenously to evaluate for intracardiac shunting. Agitated saline contrast bubble study was negative, with no evidence of any interatrial shunt.  LEFT VENTRICLE PLAX 2D LVIDd:         4.24 cm  Diastology LVIDs:         2.13 cm  LV e' lateral:   8.38 cm/s LV PW:         0.78 cm  LV E/e' lateral: 7.7 LV IVS:        0.74 cm  LV e' medial:    6.85 cm/s LVOT diam:     2.20 cm  LV E/e' medial:  9.4 LV SV:         72 LV SV Index:   40 LVOT Area:     3.80 cm  RIGHT VENTRICLE RV Basal diam:  2.61 cm RV S prime:     13.60 cm/s TAPSE (M-mode): 3.7 cm LEFT ATRIUM             Index       RIGHT ATRIUM           Index LA diam:        2.60 cm 1.43 cm/m  RA Area:     12.40 cm LA Vol (A2C):   26.6 ml 14.68 ml/m RA Volume:   24.70 ml  13.63 ml/m LA Vol (A4C):   22.4 ml 12.36 ml/m LA Biplane Vol: 25.2 ml 13.90 ml/m  AORTIC VALVE                   PULMONIC VALVE AV Area (Vmax):    3.06 cm    PV Vmax:        0.78 m/s AV Area (Vmean):  3.01 cm    PV Peak grad:   2.5 mmHg AV Area (VTI):     2.92 cm    RVOT Peak grad: 6 mmHg AV Vmax:           117.50 cm/s AV Vmean:          86.600 cm/s AV VTI:            0.248 m AV Peak Grad:      5.5 mmHg AV Mean Grad:      3.5 mmHg LVOT Vmax:         94.60 cm/s LVOT Vmean:        68.600 cm/s LVOT VTI:          0.190 m LVOT/AV VTI ratio: 0.77  AORTA Ao Root diam: 3.30 cm MITRAL VALVE               TRICUSPID VALVE MV Area (PHT): 4.71 cm    TR Peak grad:   23.8 mmHg MV Decel Time: 161 msec    TR Vmax:        244.00 cm/s MV E velocity: 64.30 cm/s MV A velocity: 86.50 cm/s  SHUNTS MV E/A ratio:  0.74        Systemic VTI:  0.19 m                            Systemic Diam: 2.20 cm Kate Sable MD Electronically signed by Kate Sable MD Signature Date/Time: 07/06/2019/1:46:56 PM    Final     Labs Recent Results (from the past 2160 hour(s))  SARS CORONAVIRUS 2 (TAT 6-24 HRS)  Nasopharyngeal Nasopharyngeal Swab     Status: None   Collection Time: 05/03/19  9:01 AM   Specimen: Nasopharyngeal Swab  Result Value Ref Range   SARS Coronavirus 2 NEGATIVE NEGATIVE    Comment: (NOTE) SARS-CoV-2 target nucleic acids are NOT DETECTED. The SARS-CoV-2 RNA is generally detectable in upper and lower respiratory specimens during the acute phase of infection. Negative results do not preclude SARS-CoV-2 infection, do not rule out co-infections with other pathogens, and should not be used as the sole basis for treatment or other patient management decisions. Negative results must be combined with clinical observations, patient history, and epidemiological information. The expected result is Negative. Fact Sheet for Patients: SugarRoll.be Fact Sheet for Healthcare Providers: https://www.woods-mathews.com/ This test is not yet approved or cleared by the [place] FDA and  has been authorized for detection and/or diagnosis of SARS-CoV-2 by FDA under an Emergency Use Authorization (EUA). This EUA will remain  in effect (meaning this test can be used) for the duration of the COVID-19 declaration under Section 56 4(b)(1) of the Act, 21 U.S.C. section 360bbb-3(b)(1), unless the authorization is terminated or revoked sooner. Performed at [facility], [place] [address], [place], [place] [address]   Surgical pathology     Status: None   Collection Time: 05/05/19 10:25 AM  Result Value Ref Range   SURGICAL PATHOLOGY      SURGICAL PATHOLOGY CASE: [id number] [phone] PATIENT: Darlyn Read Surgical Pathology Report     Specimen Submitted: A. Tongue lesion, dorsal  Clinical History: Uncertain neoplasm of tongue    DIAGNOSIS: A. 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 - LESION APPEARS COMPLETELY EXCISED. - NEGATIVE FOR MALIGNANCY.  GROSS DESCRIPTION: A.  Labeled: Dorsal tongue lesion Received: Formalin Tissue fragment(s): 1 Size: 1.2 x 1.0 x 0.7 cm  Description: Received is a polypoid fragment of tan-pink, mucosal soft tissue.  The surgical resection margin is inked blue.  The specimen is trisected perpendicularly in relation to the surgical resection margin. Entirely submitted in cassette 1.     Final Diagnosis performed by Allena Napoleon, MD.   Electronically signed 05/06/2019 11:32:13AM The electronic signature indicates that the named Attending Pathologist has evaluated the specimen Technical component performe d at [facility], [address], [place], [place] [address] Lab: [phone] Dir: Rush Farmer, MD, MMM  Professional component performed at [facility], [facility], [address], [place], [place] [address] Lab: [phone] Dir: Dellia Nims. Rubinas, MD   Glucose, capillary     Status: Abnormal   Collection Time: 07/05/19  7:51 AM  Result Value Ref Range   Glucose-Capillary 111 (H) 70 - 99 mg/dL    Comment: Glucose reference range applies only to samples taken after fasting for at least 8 hours.  Protime-INR     Status: None   Collection Time: 07/05/19  7:59 AM  Result Value Ref Range   Prothrombin Time 13.7 11.4 - 15.2 seconds   INR 1.1 0.8 - 1.2    Comment: (NOTE) INR goal varies based on device and disease states. Performed at [facility], [address] [place], [place] [address]   APTT     Status: None   Collection Time: 07/05/19  7:59 AM  Result Value Ref Range   aPTT 31 24 - 36 seconds    Comment: Performed at [facility], [address] [place], [place] [address]  CBC     Status: None   Collection Time: 07/05/19  7:59 AM  Result Value Ref Range   WBC 7.7 4.0 - 10.5 K/uL   RBC 5.27 4.22 - 5.81 MIL/uL   Hemoglobin 15.5 13.0 - 17.0 g/dL   HCT 45.0 39 - 52 %   MCV 85.4 80.0 - 100.0 fL   MCH 29.4 26.0 - 34.0 pg   MCHC 34.4 30.0 - 36.0 g/dL   RDW 12.6 11.5 - 15.5 %    Platelets 283 150 - 400 K/uL   nRBC 0.0 0.0 - 0.2 %    Comment: Performed at [facility], [address] [place],  [address]  Differential     Status: None   Collection Time: 07/05/19  7:59 AM  Result Value Ref Range   Neutrophils Relative % 60 %   Neutro Abs 4.7 1.7 - 7.7 K/uL   Lymphocytes Relative 24 %   Lymphs Abs 1.9 0.7 - 4.0 K/uL   Monocytes Relative 10 %   Monocytes Absolute 0.8 0 - 1 K/uL   Eosinophils Relative 4 %   Eosinophils Absolute 0.3 0 - 0 K/uL   Basophils Relative 1 %   Basophils Absolute 0.1 0 - 0 K/uL   Immature Granulocytes 1 %   Abs Immature Granulocytes 0.04 0.00 - 0.07 K/uL    Comment: Performed at [facility], [address], [place],  [address]  Comprehensive metabolic panel     Status: Abnormal   Collection Time: 07/05/19  7:59 AM  Result Value Ref Range   Sodium 139 135 - 145 mmol/L   Potassium 3.2 (L) 3.5 - 5.1 mmol/L   Chloride 101 98 - 111 mmol/L   CO2 27 22 - 32 mmol/L   Glucose, Bld 122 (H) 70 - 99 mg/dL    Comment: Glucose reference range applies only to samples taken after fasting for at least  8 hours.   BUN 22 8 - 23 mg/dL   Creatinine, Ser 1.19 0.61 - 1.24 mg/dL   Calcium 9.3 8.9 - 10.3 mg/dL   Total Protein 7.3 6.5 - 8.1 g/dL   Albumin 4.4 3.5 - 5.0 g/dL   AST 29 15 - 41 U/L   ALT 24 0 - 44 U/L   Alkaline Phosphatase 49 38 - 126 U/L   Total Bilirubin 1.1 0.3 - 1.2 mg/dL   GFR calc non Af Amer 59 (L) >60 mL/min   GFR calc Af Amer >60 >60 mL/min   Anion gap 11 5 - 15    Comment: Performed at [facility], [address] [place], [place] [address]  SARS Coronavirus 2 by RT PCR (hospital order, performed in [facility] hospital lab) Nasopharyngeal Nasopharyngeal Swab     Status: None   Collection Time: 07/05/19  9:14 AM   Specimen: Nasopharyngeal Swab  Result Value Ref Range   SARS Coronavirus 2 NEGATIVE NEGATIVE    Comment: (NOTE) SARS-CoV-2 target nucleic acids are NOT DETECTED. The  SARS-CoV-2 RNA is generally detectable in upper and lower respiratory specimens during the acute phase of infection. The lowest concentration of SARS-CoV-2 viral copies this assay can detect is 250 copies / mL. A negative result does not preclude SARS-CoV-2 infection and should not be used as the sole basis for treatment or other patient management decisions.  A negative result may occur with improper specimen collection / handling, submission of specimen other than nasopharyngeal swab, presence of viral mutation(s) within the areas targeted by this assay, and inadequate number of viral copies (<250 copies / mL). A negative result must be combined with clinical observations, patient history, and epidemiological information. Fact Sheet for Patients:   StrictlyIdeas.no Fact Sheet for Healthcare Providers: BankingDealers.co.za This test is not yet approved or cleared  by the [place] FDA and has been authorized for detection and/or diagnosis of SARS-CoV-2 by FDA under an Emergency Use Authorization (EUA).  This EUA will remain in effect (meaning this test can be used) for the duration of the COVID-19 declaration under Section 564(b)(1) of the Act, 21 U.S.C. section 360bbb-3(b)(1), unless the authorization is terminated or revoked sooner. Performed at [facility], [address] [place], [place] [address]   Hemoglobin A1c     Status: Abnormal   Collection Time: 07/06/19  6:19 AM  Result Value Ref Range   Hgb A1c MFr Bld 6.2 (H) 4.8 - 5.6 %    Comment: (NOTE) Pre diabetes:          5.7%-6.4% Diabetes:              >6.4% Glycemic control for   <7.0% adults with diabetes    Mean Plasma Glucose 131.24 mg/dL    Comment: Performed at [facility] [address] [place],  [address]  Lipid panel     Status: Abnormal   Collection Time: 07/06/19  6:19 AM  Result Value Ref Range   Cholesterol 193 0 - 200 mg/dL    Triglycerides 103 <150 mg/dL   HDL 33 (L) >40 mg/dL   Total CHOL/HDL Ratio 5.8 RATIO   VLDL 21 0 - 40 mg/dL   LDL Cholesterol 139 (H) 0 - 99 mg/dL    Comment:        Total Cholesterol/HDL:CHD Risk Coronary Heart Disease Risk Table  Men   Women  1/2 Average Risk   3.4   3.3  Average Risk       5.0   4.4  2 X Average Risk   9.6   7.1  3 X Average Risk  23.4   11.0        Use the calculated Patient Ratio above and the CHD Risk Table to determine the patient's CHD Risk.        ATP III CLASSIFICATION (LDL):  <100     mg/dL   Optimal  100-129  mg/dL   Near or Above                    Optimal  130-159  mg/dL   Borderline  160-189  mg/dL   High  >190     mg/dL   Very High Performed at [facility], [address] [place], [place] [address]   CBC WITH DIFFERENTIAL     Status: None   Collection Time: 07/08/19  6:33 AM  Result Value Ref Range   WBC 9.9 4.0 - 10.5 K/uL   RBC 5.12 4.22 - 5.81 MIL/uL   Hemoglobin 15.7 13.0 - 17.0 g/dL   HCT 44.5 39 - 52 %   MCV 86.9 80.0 - 100.0 fL   MCH 30.7 26.0 - 34.0 pg   MCHC 35.3 30.0 - 36.0 g/dL   RDW 12.9 11.5 - 15.5 %   Platelets 253 150 - 400 K/uL   nRBC 0.0 0.0 - 0.2 %   Neutrophils Relative % 60 %   Neutro Abs 5.9 1.7 - 7.7 K/uL   Lymphocytes Relative 27 %   Lymphs Abs 2.7 0.7 - 4.0 K/uL   Monocytes Relative 9 %   Monocytes Absolute 0.9 0 - 1 K/uL   Eosinophils Relative 3 %   Eosinophils Absolute 0.3 0 - 0 K/uL   Basophils Relative 1 %   Basophils Absolute 0.1 0 - 0 K/uL   Immature Granulocytes 0 %   Abs Immature Granulocytes 0.04 0.00 - 0.07 K/uL    Comment: Performed at [facility], [address] [place], [place] [address]  Comprehensive metabolic panel     Status: Abnormal   Collection Time: 07/08/19  6:33 AM  Result Value Ref Range   Sodium 138 135 - 145 mmol/L   Potassium 4.1 3.5 - 5.1 mmol/L    Comment: SLIGHT HEMOLYSIS   Chloride 103 98 - 111 mmol/L   CO2 23 22 - 32 mmol/L    Glucose, Bld 105 (H) 70 - 99 mg/dL    Comment: Glucose reference range applies only to samples taken after fasting for at least 8 hours.   BUN 21 8 - 23 mg/dL   Creatinine, Ser 1.20 0.61 - 1.24 mg/dL   Calcium 9.4 8.9 - 10.3 mg/dL   Total Protein 6.4 (L) 6.5 - 8.1 g/dL   Albumin 3.9 3.5 - 5.0 g/dL   AST 32 15 - 41 U/L   ALT 24 0 - 44 U/L   Alkaline Phosphatase 43 38 - 126 U/L   Total Bilirubin 1.5 (H) 0.3 - 1.2 mg/dL   GFR calc non Af Amer 59 (L) >60 mL/min   GFR calc Af Amer >60 >60 mL/min   Anion gap 12 5 - 15    Comment: Performed at [facility] [address] [place], [place] [address]  Basic metabolic panel     Status: Abnormal   Collection Time:  07/11/19  8:43 AM  Result Value Ref Range   Sodium 136 135 - 145 mmol/L   Potassium 3.8 3.5 - 5.1 mmol/L   Chloride 102 98 - 111 mmol/L   CO2 24 22 - 32 mmol/L   Glucose, Bld 134 (H) 70 - 99 mg/dL    Comment: Glucose reference range applies only to samples taken after fasting for at least 8 hours.   BUN 16 8 - 23 mg/dL   Creatinine, Ser 1.17 0.61 - 1.24 mg/dL   Calcium 9.4 8.9 - 10.3 mg/dL   GFR calc non Af Amer >60 >60 mL/min   GFR calc Af Amer >60 >60 mL/min   Anion gap 10 5 - 15    Comment: Performed at [facility] [address] [place], [place] [address]  CBC     Status: None   Collection Time: 07/11/19  8:43 AM  Result Value Ref Range   WBC 7.9 4.0 - 10.5 K/uL   RBC 5.06 4.22 - 5.81 MIL/uL   Hemoglobin 15.2 13.0 - 17.0 g/dL   HCT 43.7 39 - 52 %   MCV 86.4 80.0 - 100.0 fL   MCH 30.0 26.0 - 34.0 pg   MCHC 34.8 30.0 - 36.0 g/dL   RDW 12.5 11.5 - 15.5 %   Platelets 280 150 - 400 K/uL   nRBC 0.0 0.0 - 0.2 %    Comment: Performed at [facility], [place] [address], [place], [place] [address]    Assessment/Plan:  Essential hypertension blood pressure control important in reducing the progression of atherosclerotic disease. On appropriate oral medications.   Hyperlipidemia, mixed lipid control  important in reducing the progression of atherosclerotic disease. Continue statin therapy   Stroke [facility]) Still with some left leg weakness but has recovered reasonably well over the past 3 weeks.  Carotid stenosis He underwent a CT angiogram of the carotid arteries while he was in the hospital which I have independently reviewed.  This demonstrates what was officially read is a 78% right ICA stenosis but appears to be worse than that.  It is densely calcific and the anatomy is not favorable for carotid stenting. We had a long discussion today regarding the risks and benefits of carotid endarterectomy which is clearly indicated for stroke risk reduction.  The patient and his wife had all their questions answered.  He will continue all of his current medicines and then can hold Plavix for a few days prior to surgery.  This will be scheduled in the near future at his convenience.      Leotis Pain 07/29/2019, 11:10 AM   This note was created with Dragon medical transcription system.  Any errors from dictation are unintentional.

## 2019-07-29 NOTE — Assessment & Plan Note (Signed)
blood pressure control important in reducing the progression of atherosclerotic disease. On appropriate oral medications.  

## 2019-07-29 NOTE — Assessment & Plan Note (Signed)
He underwent a CT angiogram of the carotid arteries while he was in the hospital which I have independently reviewed.  This demonstrates what was officially read is a 78% right ICA stenosis but appears to be worse than that.  It is densely calcific and the anatomy is not favorable for carotid stenting. We had a long discussion today regarding the risks and benefits of carotid endarterectomy which is clearly indicated for stroke risk reduction.  The patient and his wife had all their questions answered.  He will continue all of his current medicines and then can hold Plavix for a few days prior to surgery.  This will be scheduled in the near future at his convenience.

## 2019-07-29 NOTE — Assessment & Plan Note (Signed)
Still with some left leg weakness but has recovered reasonably well over the past 3 weeks.

## 2019-08-01 ENCOUNTER — Telehealth (INDEPENDENT_AMBULATORY_CARE_PROVIDER_SITE_OTHER): Payer: Self-pay

## 2019-08-01 NOTE — Telephone Encounter (Signed)
Spoke with the patient and he is now scheduled with Dr. Lucky Cowboy for a RCEA on 08/04/19 at the [facility]. Patient will do a phone call pre-op on 08/03/19 between 8-1 pm and covid testing on 08/03/19 between 8-1 pm at the [facility]. Pre-surgical instructions were discussed regarding his blood thinner and stopping for 3 days and information will be mailed to the patient.

## 2019-08-02 ENCOUNTER — Other Ambulatory Visit (INDEPENDENT_AMBULATORY_CARE_PROVIDER_SITE_OTHER): Payer: Self-pay | Admitting: Nurse Practitioner

## 2019-08-03 ENCOUNTER — Other Ambulatory Visit
Admission: RE | Admit: 2019-08-03 | Discharge: 2019-08-03 | Disposition: A | Discharge status: Home / Self Care | Payer: Medicare Other | Source: Ambulatory Visit | Attending: General Surgery | Admitting: General Surgery

## 2019-08-03 ENCOUNTER — Other Ambulatory Visit
Admission: RE | Admit: 2019-08-03 | Discharge: 2019-08-03 | Disposition: A | Discharge status: Home / Self Care | Payer: Medicare Other | Source: Ambulatory Visit | Attending: Vascular Surgery | Admitting: Vascular Surgery

## 2019-08-03 ENCOUNTER — Other Ambulatory Visit: Payer: Self-pay

## 2019-08-03 HISTORY — DX: Atherosclerotic heart disease of native coronary artery without angina pectoris: I25.10

## 2019-08-03 LAB — BASIC METABOLIC PANEL
Anion gap: 8 (ref 5–15)
BUN: 22 mg/dL (ref 8–23)
CO2: 24 mmol/L (ref 22–32)
Calcium: 9.2 mg/dL (ref 8.9–10.3)
Chloride: 104 mmol/L (ref 98–111)
Creatinine, Ser: 1.1 mg/dL (ref 0.61–1.24)
GFR calc Af Amer: >60 mL/min (ref >60)
GFR calc non Af Amer: >60 mL/min (ref >60)
Glucose, Bld: 105 mg/dL — ABNORMAL HIGH (ref 70–99)
Potassium: 3.9 mmol/L (ref 3.5–5.1)
Sodium: 136 mmol/L (ref 135–145)

## 2019-08-03 LAB — SARS CORONAVIRUS 2 (TAT 6-24 HRS): SARS Coronavirus 2: NEGATIVE

## 2019-08-03 LAB — CBC WITH DIFFERENTIAL/PLATELET
Abs Immature Granulocytes: 0.05 10*3/uL (ref 0.00–0.07)
Basophils Absolute: 0.1 10*3/uL (ref 0.0–0.1)
Basophils Relative: 1 %
Eosinophils Absolute: 0.6 10*3/uL — ABNORMAL HIGH (ref 0.0–0.5)
Eosinophils Relative: 6 %
HCT: 41.7 % (ref 39.0–52.0)
Hemoglobin: 14.7 g/dL (ref 13.0–17.0)
Immature Granulocytes: 1 %
Lymphocytes Relative: 23 %
Lymphs Abs: 2.1 10*3/uL (ref 0.7–4.0)
MCH: 29.5 pg (ref 26.0–34.0)
MCHC: 35.3 g/dL (ref 30.0–36.0)
MCV: 83.7 fL (ref 80.0–100.0)
Monocytes Absolute: 0.8 10*3/uL (ref 0.1–1.0)
Monocytes Relative: 9 %
Neutro Abs: 5.5 10*3/uL (ref 1.7–7.7)
Neutrophils Relative %: 60 %
Platelets: 319 10*3/uL (ref 150–400)
RBC: 4.98 MIL/uL (ref 4.22–5.81)
RDW: 12.5 % (ref 11.5–15.5)
WBC: 9.1 10*3/uL (ref 4.0–10.5)
nRBC: 0 % (ref 0.0–0.2)

## 2019-08-03 LAB — PROTIME-INR
INR: 1.1 (ref 0.8–1.2)
Prothrombin Time: 14.1 seconds (ref 11.4–15.2)

## 2019-08-03 LAB — APTT: aPTT: 30 seconds (ref 24–36)

## 2019-08-03 LAB — TYPE AND SCREEN
ABO/RH(D): B POS
Antibody Screen: NEGATIVE

## 2019-08-03 NOTE — Patient Instructions (Signed)
Your procedure is scheduled on: Thursday 08/04/19.  Report to DAY SURGERY DEPARTMENT LOCATED ON 2ND FLOOR MEDICAL MALL ENTRANCE. To find out your arrival time please call [phone] between 1PM - 3PM on Wednesday 08/03/19.   Remember: Instructions that are not followed completely may result in serious medical risk, up to and including death, or upon the discretion of your surgeon and anesthesiologist your surgery may need to be rescheduled.      _X__ 1. Do not eat food after midnight the night before your procedure.                 No gum chewing or hard candies. You may drink clear liquids up to 2 hours                 before you are scheduled to arrive for your surgery- DO NOT drink clear                 liquids within 2 hours of the start of your surgery.                 Clear Liquids include:  water, apple juice without pulp, clear carbohydrate                 drink such as Clearfast or Gatorade, Black Coffee or Tea (Do not add                 milk or creamer to your coffee or tea.).   __X__2.  On the morning of surgery brush your teeth with toothpaste and water, you may rinse your mouth with mouthwash if you wish.  Do not swallow any toothpaste or mouthwash.      _X__ 3.  No Alcohol for 24 hours before or after surgery.    _X__ 4.  Do Not Smoke or use e-cigarettes For 24 Hours Prior to Your Surgery.                 Do not use any chewable tobacco products for at least 6 hours prior to                 surgery.   __X__5.  Notify your doctor if there is any change in your medical condition      (cold, fever, infections).       Do not wear jewelry, make-up, hairpins, clips or nail polish. Do not wear lotions, powders, or perfumes.  Do not shave 48 hours prior to surgery. Men may shave face and neck. Do not bring valuables to the hospital.    [facility] is not responsible for any belongings or valuables.   Contacts, dentures/partials or body piercings may not be worn into  surgery. Bring a case for your contacts, glasses or hearing aids, a denture cup will be supplied.   Leave your suitcase in the car. After surgery it may be brought to your room. For patients admitted to the hospital, discharge time is determined by your treatment team.       __X__ Take these medicines the morning of surgery with A SIP OF WATER:     1. pantoprazole (PROTONIX)      __X__ Use CHG Soap as directed    __X__ Stop Blood Thinners Plavix & Aspirin. You reported stopping these on Tuesday. Your last dose was on Monday  08/01/19.    __X__ Stop Anti-inflammatories 7 days before surgery such as Advil, Ibuprofen, Motrin, BC or Goodies Powder, Naprosyn, Naproxen, Aleve,  Aspirin, Meloxicam. May take Tylenol if needed for pain or discomfort.     __X__ Don't start taking any new herbal supplements or vitamins prior to your procedure.

## 2019-08-04 ENCOUNTER — Inpatient Hospital Stay: Payer: Medicare Other | Admitting: Anesthesiology

## 2019-08-04 ENCOUNTER — Encounter
Admission: RE | Disposition: A | Discharge status: Home / Self Care | Payer: Self-pay | Source: Ambulatory Visit | Attending: Vascular Surgery

## 2019-08-04 ENCOUNTER — Encounter: Payer: Self-pay | Admitting: Vascular Surgery

## 2019-08-04 ENCOUNTER — Other Ambulatory Visit: Payer: Self-pay

## 2019-08-04 ENCOUNTER — Inpatient Hospital Stay
Admission: RE | Admit: 2019-08-04 | Discharge: 2019-08-05 | DRG: 038 | Disposition: A | Discharge status: Home / Self Care | Payer: Medicare Other | Attending: Vascular Surgery | Admitting: Vascular Surgery

## 2019-08-04 DIAGNOSIS — Z8673 Personal history of transient ischemic attack (TIA), and cerebral infarction without residual deficits: Secondary | ICD-10-CM

## 2019-08-04 DIAGNOSIS — I10 Essential (primary) hypertension: Secondary | ICD-10-CM | POA: Diagnosis present

## 2019-08-04 DIAGNOSIS — I6521 Occlusion and stenosis of right carotid artery: Secondary | ICD-10-CM | POA: Diagnosis present

## 2019-08-04 DIAGNOSIS — Z95811 Presence of heart assist device: Secondary | ICD-10-CM

## 2019-08-04 DIAGNOSIS — I6529 Occlusion and stenosis of unspecified carotid artery: Secondary | ICD-10-CM | POA: Diagnosis not present

## 2019-08-04 DIAGNOSIS — Z972 Presence of dental prosthetic device (complete) (partial): Secondary | ICD-10-CM

## 2019-08-04 DIAGNOSIS — I2510 Atherosclerotic heart disease of native coronary artery without angina pectoris: Secondary | ICD-10-CM | POA: Diagnosis present

## 2019-08-04 DIAGNOSIS — Z20822 Contact with and (suspected) exposure to covid-19: Secondary | ICD-10-CM | POA: Diagnosis present

## 2019-08-04 HISTORY — PX: ENDARTERECTOMY: SHX5162

## 2019-08-04 LAB — ABO/RH: ABO/RH(D): B POS

## 2019-08-04 LAB — MRSA PCR SCREENING: MRSA by PCR: NEGATIVE

## 2019-08-04 LAB — GLUCOSE, CAPILLARY: Glucose-Capillary: 118 mg/dL — ABNORMAL HIGH (ref 70–99)

## 2019-08-04 SURGERY — ENDARTERECTOMY, CAROTID
Anesthesia: General | Laterality: Right

## 2019-08-04 MED ORDER — ALUM & MAG HYDROXIDE-SIMETH 200-200-20 MG/5ML PO SUSP: 15.0000 mL | ORAL | Status: DC | PRN

## 2019-08-04 MED ORDER — ACETAMINOPHEN 650 MG RE SUPP: 325.0000 mg | RECTAL | Status: DC | PRN

## 2019-08-04 MED ORDER — PROPOFOL 10 MG/ML IV BOLUS
INTRAVENOUS | Status: DC | PRN
  Administered 2019-08-04: 120 mg via INTRAVENOUS

## 2019-08-04 MED ORDER — ASPIRIN EC 81 MG PO TBEC
81.0000 mg | DELAYED_RELEASE_TABLET | Freq: Every day | ORAL | Status: DC
  Administered 2019-08-04 – 2019-08-05 (×2): 81 mg via ORAL
  Filled 2019-08-04 (×2): qty 1

## 2019-08-04 MED ORDER — OXYCODONE-ACETAMINOPHEN 5-325 MG PO TABS
1.0000 | ORAL_TABLET | ORAL | Status: DC | PRN
  Administered 2019-08-04 – 2019-08-05 (×2): 2 via ORAL
  Filled 2019-08-04 (×2): qty 2

## 2019-08-04 MED ORDER — MONTELUKAST SODIUM 10 MG PO TABS
10.0000 mg | ORAL_TABLET | Freq: Every day | ORAL | Status: DC
  Administered 2019-08-04: 10 mg via ORAL
  Filled 2019-08-04: qty 1

## 2019-08-04 MED ORDER — PHENOL 1.4 % MT LIQD
1.0000 | OROMUCOSAL | Status: DC | PRN
  Filled 2019-08-04: qty 177

## 2019-08-04 MED ORDER — HEPARIN SODIUM (PORCINE) 1000 UNIT/ML IJ SOLN
INTRAMUSCULAR | Status: DC | PRN
  Administered 2019-08-04: 5000 [IU] via INTRAVENOUS

## 2019-08-04 MED ORDER — POTASSIUM CHLORIDE CRYS ER 20 MEQ PO TBCR: 20.0000 meq | EXTENDED_RELEASE_TABLET | Freq: Every day | ORAL | Status: DC | PRN

## 2019-08-04 MED ORDER — PHENYLEPHRINE HCL (PRESSORS) 10 MG/ML IV SOLN
INTRAVENOUS | Status: DC | PRN
  Administered 2019-08-04: 100 ug via INTRAVENOUS

## 2019-08-04 MED ORDER — CLINDAMYCIN PHOSPHATE 300 MG/50ML IV SOLN
300.0000 mg | Freq: Three times a day (TID) | INTRAVENOUS | Status: DC
  Administered 2019-08-04 – 2019-08-05 (×3): 300 mg via INTRAVENOUS
  Filled 2019-08-04 (×4): qty 50

## 2019-08-04 MED ORDER — NITROGLYCERIN IN D5W 200-5 MCG/ML-% IV SOLN: 5.0000 ug/min | INTRAVENOUS | Status: DC

## 2019-08-04 MED ORDER — CLOPIDOGREL BISULFATE 75 MG PO TABS
75.0000 mg | ORAL_TABLET | Freq: Every day | ORAL | Status: DC
  Administered 2019-08-04 – 2019-08-05 (×2): 75 mg via ORAL
  Filled 2019-08-04 (×2): qty 1

## 2019-08-04 MED ORDER — ROCURONIUM BROMIDE 100 MG/10ML IV SOLN
INTRAVENOUS | Status: DC | PRN
  Administered 2019-08-04: 50 mg via INTRAVENOUS

## 2019-08-04 MED ORDER — PANTOPRAZOLE SODIUM 40 MG PO TBEC
40.0000 mg | DELAYED_RELEASE_TABLET | Freq: Every day | ORAL | Status: DC
  Administered 2019-08-05: 40 mg via ORAL
  Filled 2019-08-04: qty 1

## 2019-08-04 MED ORDER — PROMETHAZINE HCL 25 MG/ML IJ SOLN: 6.2500 mg | INTRAMUSCULAR | Status: DC | PRN

## 2019-08-04 MED ORDER — DEXAMETHASONE SODIUM PHOSPHATE 10 MG/ML IJ SOLN
INTRAMUSCULAR | Status: DC | PRN
  Administered 2019-08-04: 5 mg via INTRAVENOUS

## 2019-08-04 MED ORDER — FAMOTIDINE IN NACL 20-0.9 MG/50ML-% IV SOLN
20.0000 mg | Freq: Two times a day (BID) | INTRAVENOUS | Status: DC
  Administered 2019-08-04 – 2019-08-05 (×2): 20 mg via INTRAVENOUS
  Filled 2019-08-04 (×2): qty 50

## 2019-08-04 MED ORDER — CLINDAMYCIN PHOSPHATE 300 MG/50ML IV SOLN
300.0000 mg | INTRAVENOUS | Status: AC
  Administered 2019-08-04: 300 mg via INTRAVENOUS

## 2019-08-04 MED ORDER — HYDRALAZINE HCL 20 MG/ML IJ SOLN: 5.0000 mg | INTRAMUSCULAR | Status: DC | PRN

## 2019-08-04 MED ORDER — FENTANYL CITRATE (PF) 100 MCG/2ML IJ SOLN
INTRAMUSCULAR | Status: AC
  Filled 2019-08-04: qty 2

## 2019-08-04 MED ORDER — FENTANYL CITRATE (PF) 100 MCG/2ML IJ SOLN
25.0000 ug | INTRAMUSCULAR | Status: DC | PRN
  Administered 2019-08-04 (×4): 25 ug via INTRAVENOUS

## 2019-08-04 MED ORDER — SODIUM CHLORIDE 0.9 % IV SOLN: 500.0000 mL | Freq: Once | INTRAVENOUS | Status: DC | PRN

## 2019-08-04 MED ORDER — LIDOCAINE HCL 1 % IJ SOLN
INTRAMUSCULAR | Status: DC | PRN
  Administered 2019-08-04: 10 mL

## 2019-08-04 MED ORDER — VITAMIN D 25 MCG (1000 UNIT) PO TABS
1000.0000 [IU] | ORAL_TABLET | Freq: Every day | ORAL | Status: DC
  Administered 2019-08-05: 1000 [IU] via ORAL
  Filled 2019-08-04: qty 1

## 2019-08-04 MED ORDER — CHLORHEXIDINE GLUCONATE CLOTH 2 % EX PADS
6.0000 | MEDICATED_PAD | Freq: Once | CUTANEOUS | Status: AC
  Administered 2019-08-04: 6 via TOPICAL

## 2019-08-04 MED ORDER — LIDOCAINE HCL (CARDIAC) PF 100 MG/5ML IV SOSY
PREFILLED_SYRINGE | INTRAVENOUS | Status: DC | PRN
  Administered 2019-08-04: 80 mg via INTRAVENOUS

## 2019-08-04 MED ORDER — NITROGLYCERIN IN D5W 200-5 MCG/ML-% IV SOLN
INTRAVENOUS | Status: AC
  Filled 2019-08-04: qty 250

## 2019-08-04 MED ORDER — LIDOCAINE HCL (PF) 1 % IJ SOLN
INTRAMUSCULAR | Status: AC
  Filled 2019-08-04: qty 30

## 2019-08-04 MED ORDER — BUPIVACAINE HCL (PF) 0.25 % IJ SOLN
INTRAMUSCULAR | Status: AC
  Filled 2019-08-04: qty 30

## 2019-08-04 MED ORDER — LABETALOL HCL 5 MG/ML IV SOLN: 10.0000 mg | INTRAVENOUS | Status: DC | PRN

## 2019-08-04 MED ORDER — CHLORHEXIDINE GLUCONATE 0.12 % MT SOLN
OROMUCOSAL | Status: AC
  Filled 2019-08-04: qty 15

## 2019-08-04 MED ORDER — LACTATED RINGERS IV SOLN: INTRAVENOUS | Status: DC

## 2019-08-04 MED ORDER — CLINDAMYCIN PHOSPHATE 300 MG/50ML IV SOLN
INTRAVENOUS | Status: AC
  Filled 2019-08-04: qty 50

## 2019-08-04 MED ORDER — ORAL CARE MOUTH RINSE: 15.0000 mL | Freq: Once | OROMUCOSAL | Status: AC

## 2019-08-04 MED ORDER — HEPARIN SODIUM (PORCINE) 5000 UNIT/ML IJ SOLN
INTRAMUSCULAR | Status: AC
  Filled 2019-08-04: qty 2

## 2019-08-04 MED ORDER — ONDANSETRON HCL 4 MG/2ML IJ SOLN
INTRAMUSCULAR | Status: DC | PRN
  Administered 2019-08-04: 4 mg via INTRAVENOUS

## 2019-08-04 MED ORDER — "VISTASEAL 4 ML SINGLE DOSE KIT "
PACK | CUTANEOUS | Status: DC | PRN
  Administered 2019-08-04: 4 mL via TOPICAL

## 2019-08-04 MED ORDER — ROSUVASTATIN CALCIUM 10 MG PO TABS
40.0000 mg | ORAL_TABLET | Freq: Every day | ORAL | Status: DC
  Administered 2019-08-04: 40 mg via ORAL
  Filled 2019-08-04: qty 4

## 2019-08-04 MED ORDER — CHLORHEXIDINE GLUCONATE 0.12 % MT SOLN
15.0000 mL | Freq: Once | OROMUCOSAL | Status: AC
  Administered 2019-08-04: 15 mL via OROMUCOSAL

## 2019-08-04 MED ORDER — METOPROLOL TARTRATE 5 MG/5ML IV SOLN: 2.0000 mg | INTRAVENOUS | Status: DC | PRN

## 2019-08-04 MED ORDER — SODIUM CHLORIDE 0.9 % IV SOLN: INTRAVENOUS | Status: DC

## 2019-08-04 MED ORDER — ONDANSETRON HCL 4 MG/2ML IJ SOLN: 4.0000 mg | Freq: Four times a day (QID) | INTRAMUSCULAR | Status: DC | PRN

## 2019-08-04 MED ORDER — ACETAMINOPHEN 160 MG/5ML PO SOLN: 325.0000 mg | ORAL | Status: DC | PRN

## 2019-08-04 MED ORDER — HYDROCODONE-ACETAMINOPHEN 7.5-325 MG PO TABS: 1.0000 | ORAL_TABLET | Freq: Once | ORAL | Status: DC | PRN

## 2019-08-04 MED ORDER — PHENYLEPHRINE HCL-NACL 20-0.9 MG/250ML-% IV SOLN
INTRAVENOUS | Status: DC | PRN
  Administered 2019-08-04: 25 ug/min via INTRAVENOUS

## 2019-08-04 MED ORDER — ESMOLOL HCL-SODIUM CHLORIDE 2000 MG/100ML IV SOLN
25.0000 ug/kg/min | INTRAVENOUS | Status: DC
  Filled 2019-08-04: qty 100

## 2019-08-04 MED ORDER — ACETAMINOPHEN 325 MG PO TABS: 325.0000 mg | ORAL_TABLET | ORAL | Status: DC | PRN

## 2019-08-04 MED ORDER — MORPHINE SULFATE (PF) 2 MG/ML IV SOLN
2.0000 mg | INTRAVENOUS | Status: DC | PRN
  Administered 2019-08-04 (×2): 2 mg via INTRAVENOUS
  Filled 2019-08-04 (×2): qty 1

## 2019-08-04 MED ORDER — EPHEDRINE SULFATE 50 MG/ML IJ SOLN
INTRAMUSCULAR | Status: DC | PRN
  Administered 2019-08-04: 10 mg via INTRAVENOUS

## 2019-08-04 MED ORDER — HEMOSTATIC AGENTS (NO CHARGE) OPTIME
TOPICAL | Status: DC | PRN
  Administered 2019-08-04: 1 via TOPICAL

## 2019-08-04 MED ORDER — SODIUM CHLORIDE 0.9 % IV SOLN
INTRAVENOUS | Status: DC | PRN
  Administered 2019-08-04: 250 mL via INTRAMUSCULAR

## 2019-08-04 MED ORDER — FENTANYL CITRATE (PF) 100 MCG/2ML IJ SOLN
INTRAMUSCULAR | Status: DC | PRN
  Administered 2019-08-04: 100 ug via INTRAVENOUS

## 2019-08-04 MED ORDER — DOCUSATE SODIUM 100 MG PO CAPS
100.0000 mg | ORAL_CAPSULE | Freq: Every day | ORAL | Status: DC
  Administered 2019-08-05: 100 mg via ORAL
  Filled 2019-08-04: qty 1

## 2019-08-04 MED ORDER — GUAIFENESIN-DM 100-10 MG/5ML PO SYRP: 15.0000 mL | ORAL_SOLUTION | ORAL | Status: DC | PRN

## 2019-08-04 MED ORDER — FLUTICASONE PROPIONATE 50 MCG/ACT NA SUSP
2.0000 | Freq: Every day | NASAL | Status: DC
  Administered 2019-08-05: 2 via NASAL
  Filled 2019-08-04: qty 16

## 2019-08-04 MED ORDER — DIPHENHYDRAMINE HCL 25 MG PO CAPS
25.0000 mg | ORAL_CAPSULE | Freq: Four times a day (QID) | ORAL | Status: DC | PRN
  Filled 2019-08-04: qty 1

## 2019-08-04 MED ORDER — MAGNESIUM SULFATE 2 GM/50ML IV SOLN: 2.0000 g | Freq: Every day | INTRAVENOUS | Status: DC | PRN

## 2019-08-04 SURGICAL SUPPLY — 59 items
BAG DECANTER FOR FLEXI CONT (MISCELLANEOUS) ×3 IMPLANT
BLADE SURG 15 STRL LF DISP TIS (BLADE) ×1 IMPLANT
BLADE SURG 15 STRL SS (BLADE) ×2
BLADE SURG SZ11 CARB STEEL (BLADE) ×3 IMPLANT
BOOT SUTURE AID YELLOW STND (SUTURE) ×3 IMPLANT
BRUSH SCRUB EZ  4% CHG (MISCELLANEOUS) ×2
BRUSH SCRUB EZ 4% CHG (MISCELLANEOUS) ×1 IMPLANT
CANISTER SUCT 1200ML W/VALVE (MISCELLANEOUS) ×3 IMPLANT
CHLORAPREP W/TINT 26ML (MISCELLANEOUS) ×3 IMPLANT
COVER WAND RF STERILE (DRAPES) ×3 IMPLANT
DERMABOND ADVANCED (GAUZE/BANDAGES/DRESSINGS) ×2
DERMABOND ADVANCED .7 DNX12 (GAUZE/BANDAGES/DRESSINGS) ×1 IMPLANT
DRAPE 3/4 80X56 (DRAPES) IMPLANT
DRAPE INCISE IOBAN 66X45 STRL (DRAPES) ×3 IMPLANT
DRAPE LAPAROTOMY 77X122 PED (DRAPES) ×3 IMPLANT
ELECT CAUTERY BLADE 6.4 (BLADE) ×3 IMPLANT
ELECT REM PT RETURN 9FT ADLT (ELECTROSURGICAL) ×3
ELECTRODE REM PT RTRN 9FT ADLT (ELECTROSURGICAL) ×1 IMPLANT
GLOVE BIO SURGEON STRL SZ7 (GLOVE) ×9 IMPLANT
GLOVE INDICATOR 7.5 STRL GRN (GLOVE) ×3 IMPLANT
GOWN STRL REUS W/ TWL LRG LVL3 (GOWN DISPOSABLE) ×2 IMPLANT
GOWN STRL REUS W/ TWL XL LVL3 (GOWN DISPOSABLE) ×2 IMPLANT
GOWN STRL REUS W/TWL LRG LVL3 (GOWN DISPOSABLE) ×4
GOWN STRL REUS W/TWL XL LVL3 (GOWN DISPOSABLE) ×4
HEMOSTAT SURGICEL 2X3 (HEMOSTASIS) ×3 IMPLANT
IV NS 250ML (IV SOLUTION) ×2
IV NS 250ML BAXH (IV SOLUTION) ×1 IMPLANT
KIT TURNOVER KIT A (KITS) ×3 IMPLANT
LABEL OR SOLS (LABEL) ×3 IMPLANT
LOOP RED MAXI  1X406MM (MISCELLANEOUS) ×4
LOOP VESSEL MAXI 1X406 RED (MISCELLANEOUS) ×2 IMPLANT
LOOP VESSEL MINI 0.8X406 BLUE (MISCELLANEOUS) ×1 IMPLANT
LOOPS BLUE MINI 0.8X406MM (MISCELLANEOUS) ×2
NEEDLE FILTER BLUNT 18X 1/2SAF (NEEDLE) ×2
NEEDLE FILTER BLUNT 18X1 1/2 (NEEDLE) ×1 IMPLANT
NEEDLE HYPO 25X1 1.5 SAFETY (NEEDLE) ×3 IMPLANT
NS IRRIG 500ML POUR BTL (IV SOLUTION) ×3 IMPLANT
PACK BASIN MAJOR (MISCELLANEOUS) ×3 IMPLANT
PATCH CAROTID ECM VASC 1X10 (Prosthesis & Implant Heart) ×3 IMPLANT
PENCIL ELECTRO HAND CTR (MISCELLANEOUS) ×3 IMPLANT
SHUNT W TPORT 9FR PRUITT F3 (SHUNT) ×3 IMPLANT
SUT MNCRL 4-0 (SUTURE) ×2
SUT MNCRL 4-0 27XMFL (SUTURE) ×1
SUT PROLENE 6 0 BV (SUTURE) ×18 IMPLANT
SUT PROLENE 7 0 BV 1 (SUTURE) ×3 IMPLANT
SUT SILK 2 0 (SUTURE) ×2
SUT SILK 2-0 18XBRD TIE 12 (SUTURE) ×1 IMPLANT
SUT SILK 3 0 (SUTURE) ×2
SUT SILK 3-0 18XBRD TIE 12 (SUTURE) ×1 IMPLANT
SUT SILK 4 0 (SUTURE) ×2
SUT SILK 4-0 18XBRD TIE 12 (SUTURE) ×1 IMPLANT
SUT VIC AB 3-0 SH 27 (SUTURE) ×4
SUT VIC AB 3-0 SH 27X BRD (SUTURE) ×2 IMPLANT
SUTURE MNCRL 4-0 27XMF (SUTURE) ×1 IMPLANT
SYR 10ML LL (SYRINGE) ×6 IMPLANT
SYR 20ML LL LF (SYRINGE) ×3 IMPLANT
TRAY FOLEY MTR SLVR 16FR STAT (SET/KITS/TRAYS/PACK) ×3 IMPLANT
TUBING CONNECTING 10 (TUBING) IMPLANT
TUBING CONNECTING 10' (TUBING)

## 2019-08-04 NOTE — Progress Notes (Signed)
Pt came from pacu Alert and oriented and has remained that way. Wife at bedside, pt just finished dinner, well tolerated. Vitals WNL. Will continue to monitor

## 2019-08-04 NOTE — Anesthesia Procedure Notes (Signed)
Procedure Name: Intubation Performed by: Fredderick Phenix, CRNA Pre-anesthesia Checklist: Patient identified, Emergency Drugs available, Suction available and Patient being monitored Patient Re-evaluated:Patient Re-evaluated prior to induction Oxygen Delivery Method: Circle system utilized Preoxygenation: Pre-oxygenation with 100% oxygen Induction Type: IV induction Ventilation: Mask ventilation without difficulty Laryngoscope Size: Mac and 3 Grade View: Grade I Tube type: Oral Tube size: 7.0 mm Number of attempts: 1 Airway Equipment and Method: Stylet and Oral airway Placement Confirmation: ETT inserted through vocal cords under direct vision,  positive ETCO2 and breath sounds checked- equal and bilateral Secured at: 21 cm Tube secured with: Tape Dental Injury: Teeth and Oropharynx as per pre-operative assessment

## 2019-08-04 NOTE — Anesthesia Preprocedure Evaluation (Addendum)
Anesthesia Evaluation  Patient identified by MRN, date of birth, ID band Patient awake    Reviewed: Allergy & Precautions, H&P , NPO status , reviewed documented beta blocker date and time   Airway Mallampati: III  TM Distance: <3 FB Neck ROM: limited    Dental  (+) Partial Lower, Chipped, Missing, Caps, Dental Advidsory Given   Pulmonary    Pulmonary exam normal        Cardiovascular hypertension, + CAD  Normal cardiovascular exam  07/2019 ECHO 1. Left ventricular ejection fraction, by estimation, is 65 to 70%. The  left ventricle has normal function. The left ventricle has no regional  wall motion abnormalities. Left ventricular diastolic parameters are  consistent with Grade I diastolic  dysfunction (impaired relaxation).  2. Right ventricular systolic function is normal. The right ventricular  size is normal. There is normal pulmonary artery systolic pressure.  3. The mitral valve is normal in structure. No evidence of mitral valve  regurgitation. No evidence of mitral stenosis.  4. The aortic valve is normal in structure. Aortic valve regurgitation is  not visualized. No aortic stenosis is present.  5. The inferior vena cava is normal in size with greater than 50%  respiratory variability, suggesting right atrial pressure of 3 mmHg.  6. Agitated saline contrast bubble study was negative, with no evidence  of any interatrial shunt.    Neuro/Psych Some mild residual L leg/arm stiffness/weakness TIACVA, Residual Symptoms    GI/Hepatic GERD  Medicated and Controlled,  Endo/Other    Renal/GU Renal disease     Musculoskeletal  (+) Arthritis ,   Abdominal   Peds  Hematology   Anesthesia Other Findings Past Medical History: No date: Adenomatous colon polyp No date: Allergy No date: Arthritis No date: Cancer ([place]) No date: Coronary artery disease No date: GERD (gastroesophageal reflux disease) No date:  Hypertension No date: Wears hearing aid in both ears Past Surgical History: No date: cyst removed 05/05/2019: EXCISION OF TONGUE LESION; Bilateral     Comment:  Procedure: EXCISION OF DORSAL TONGUE LESION;  Surgeon:               Margaretha Sheffield, MD;  Location: [facility];                Service: ENT;  Laterality: Bilateral; No date: skin cancer removed   Reproductive/Obstetrics                          Anesthesia Physical Anesthesia Plan  ASA: III  Anesthesia Plan: General   Post-op Pain Management:    Induction: Intravenous  PONV Risk Score and Plan: Ondansetron and Treatment may vary due to age or medical condition  Airway Management Planned: Oral ETT  Additional Equipment:   Intra-op Plan:   Post-operative Plan: Extubation in OR  Informed Consent: I have reviewed the patients History and Physical, chart, labs and discussed the procedure including the risks, benefits and alternatives for the proposed anesthesia with the patient or authorized representative who has indicated his/her understanding and acceptance.     Dental Advisory Given  Plan Discussed with: CRNA  Anesthesia Plan Comments:         Anesthesia Quick Evaluation

## 2019-08-04 NOTE — Plan of Care (Signed)

## 2019-08-04 NOTE — H&P (Signed)
[facility] History & Physical Update  The patient was interviewed and re-examined.  The patient's previous History and Physical has been reviewed and is unchanged.  There is no change in the plan of care. We plan to proceed with the scheduled procedure.  Leotis Pain, MD  08/04/2019, 10:31 AM

## 2019-08-04 NOTE — Op Note (Addendum)
[name]   OPERATIVE NOTE  PROCEDURE:   1.  Right carotid endarterectomy with CorMatrix arterial patch reconstruction  PRE-OPERATIVE DIAGNOSIS: 1.  Right carotid stenosis 2. Recent stroke approximately four weeks ago  POST-OPERATIVE DIAGNOSIS: same as above   SURGEON: Leotis Pain, MD  ASSISTANT(S): Hezzie Bump, PA-C  ANESTHESIA: general  ESTIMATED BLOOD LOSS: 25 cc  FINDING(S): 1.  Right carotid plaque.  SPECIMEN(S):  Carotid plaque (sent to Pathology)  INDICATIONS:   Russell Herman is a 74 y.o. male who presents with right carotid stenosis of 80%.  I discussed with the patient the risks, benefits, and alternatives to carotid endarterectomy.  I discussed the differences between carotid stenting and carotid endarterectomy. I discussed the procedural details of carotid endarterectomy with the patient.  The patient is aware that the risks of carotid endarterectomy include but are not limited to: bleeding, infection, stroke, myocardial infarction, death, cranial nerve injuries both temporary and permanent, neck hematoma, possible airway compromise, labile blood pressure post-operatively, cerebral hyperperfusion syndrome, and possible need for additional interventions in the future. The patient is aware of the risks and agrees to proceed forward with the procedure. An assistant was present during the procedure to help facilitate the exposure and expedite the procedure.  DESCRIPTION: After full informed written consent was obtained from the patient, the patient was brought back to the operating room and placed supine upon the operating table.  Prior to induction, the patient received IV antibiotics.  After obtaining adequate anesthesia, the patient was placed into a modified beach chair position with a shoulder roll in place and the patient's neck slightly hyperextended and rotated away from the surgical site. The assistant provided retraction and mobilization to  help facilitate exposure and expedite the procedure throughout the entire procedure.  This included following suture, using retractors, and optimizing lighting. The patient was prepped in the standard fashion for a carotid endarterectomy.  I made an incision anterior to the sternocleidomastoid muscle and dissected down through the subcutaneous tissue.  The platysmas was opened with electrocautery.  Then I dissected down to the internal jugular vein and facial vein.  The facial vein is ligated and divided between 2-0 silk ties.  This was dissected posteriorly until I obtained visualization of the common carotid artery.  This was dissected out and then a vessel loop was placed around the common carotid artery.  I then dissected in a periadventitial fashion along the common carotid artery up to the bifurcation.  I then identified the external carotid artery and the superior thyroid artery.  I placed a vessel loop around the superior thyroid artery, and I also dissected out the external carotid artery and placed a vessel loop around it. In the process of this dissection, the hypoglossal nerve was identified and protected from harm.  I then dissected out the internal carotid artery until I identified an area in the internal carotid artery clearly above the stenosis.  I dissected slightly distal to this area, and placed a vessel loop around the artery.  At this point, we gave the patient 5000 units of intravenous heparin.  After this was allowed to circulate for several minutes, I pulled up control on the vessel loops to clamp the internal carotid artery, external carotid artery, superior thyroid artery, and then the common carotid artery.  I then made an arteriotomy in the common carotid artery with a 11 blade, and extended the arteriotomy with a Potts scissor down into the common carotid artery, then I  carried the arteriotomy through the bifurcation into the internal carotid artery until I reached an area that was not  diseased.  At this point, I took the [place] shunt that previously been prepared and I inserted it into the internal carotid artery first, and then into the common carotid artery taking care to flush and de-air prior to release of control. At this point, I started the endarterectomy in the common carotid artery with a [place] elevator and carried this dissection down into the common carotid artery circumferentially.  Then I transected the plaque at a segment where it was adherent and transected the plaque with Potts scissors.  I then carried this dissection up into the external carotid artery.  The plaque was extracted by unclamping the external carotid artery and performing an eversion endarterectomy.  The dissection was then carried into the internal carotid artery where a nice feathered end point was created with gentle traction.  I passed the plaque off the field as a specimen. At this point I removed all loose flecks and remaining disease possible.  At this point, I was satisfied that the minimal remaining disease was densely adherent to the wall and wall integrity was intact. The distal endpoint was tacked down with two 7-0 Prolene sutures.  I then fashioned a CorMatrix arterial patch for the artery and sewed it in place with two running stitch of 6-0 Prolene.  I started at the distal endpoint and ran one half the length of the arteriotomy.  I then cut and beveled the patch to an appropriate length to match the arteriotomy.  I started the second 6-0 Prolene at the proximal end point.  The medial suture line was completed and the lateral suture line was run approximately one quarter the length of the arteriotomy.  Prior to completing this patch angioplasty, I removed the shunt first from the internal carotid artery, from which there was excellent backbleeding, and clamped it.  Then I removed the shunt from the common carotid artery, from which there was excellent antegrade bleeding, and then clamped it.   At this point, I allowed the external carotid artery to backbleed, which was excellent.  Then I instilled heparinized saline in this patched artery and then completed the patch angioplasty in the usual fashion.  First, I released the clamp on the external carotid artery, then I released it on the common carotid artery.  After waiting a few seconds, I then released it on the internal carotid artery. Several minutes of pressure were held and 6-0 Prolene patch sutures were used as need for hemostasis.  At this point, I placed Surgicel and Evicel topical hemostatic agents.  There was no more active bleeding in the surgical site.  The sternocleidomastoid space was closed with three interrupted 3-0 Vicryl sutures. I then reapproximated the platysma muscle with a running stitch of 3-0 Vicryl.  The skin was then closed with a running subcuticular 4-0 Monocryl.  The skin was then cleaned, dried and Dermabond was used to reinforce the skin closure.  The patient awakened and was taken to the recovery room in stable condition, following commands and moving all four extremities without any apparent deficits.    COMPLICATIONS: none  CONDITION: stable  Leotis Pain  08/04/2019, 2:44 PM    This note was created with Dragon Medical transcription system. Any errors in dictation are purely unintentional.

## 2019-08-04 NOTE — Transfer of Care (Signed)
Immediate Anesthesia Transfer of Care Note  Patient: Russell Herman  Procedure(s) Performed: ENDARTERECTOMY CAROTID (Right )  Patient Location: PACU  Anesthesia Type:General  Level of Consciousness: awake and alert   Airway & Oxygen Therapy: Patient Spontanous Breathing and Patient connected to face mask oxygen  Post-op Assessment: Report given to RN and Post -op Vital signs reviewed and stable  Post vital signs: Reviewed and stable  Last Vitals:  Vitals Value Taken Time  BP 133/72 08/04/19 1452  Temp    Pulse 72 08/04/19 1456  Resp 15 08/04/19 1456  SpO2 96 % 08/04/19 1456  Vitals shown include unvalidated device data.  Last Pain:  Vitals:   08/04/19 1114  TempSrc: Temporal  PainSc: 0-No pain         Complications: No complications documented.

## 2019-08-05 ENCOUNTER — Other Ambulatory Visit (INDEPENDENT_AMBULATORY_CARE_PROVIDER_SITE_OTHER): Payer: Self-pay | Admitting: Vascular Surgery

## 2019-08-05 ENCOUNTER — Encounter: Payer: Self-pay | Admitting: Vascular Surgery

## 2019-08-05 ENCOUNTER — Other Ambulatory Visit: Payer: Medicare Other

## 2019-08-05 DIAGNOSIS — I6529 Occlusion and stenosis of unspecified carotid artery: Secondary | ICD-10-CM

## 2019-08-05 LAB — CBC
HCT: 39.3 % (ref 39.0–52.0)
Hemoglobin: 13.8 g/dL (ref 13.0–17.0)
MCH: 29.6 pg (ref 26.0–34.0)
MCHC: 35.1 g/dL (ref 30.0–36.0)
MCV: 84.2 fL (ref 80.0–100.0)
Platelets: 336 10*3/uL (ref 150–400)
RBC: 4.67 MIL/uL (ref 4.22–5.81)
RDW: 12.4 % (ref 11.5–15.5)
WBC: 22.2 10*3/uL — ABNORMAL HIGH (ref 4.0–10.5)
nRBC: 0 % (ref 0.0–0.2)

## 2019-08-05 LAB — BASIC METABOLIC PANEL
Anion gap: 13 (ref 5–15)
BUN: 19 mg/dL (ref 8–23)
CO2: 19 mmol/L — ABNORMAL LOW (ref 22–32)
Calcium: 8.8 mg/dL — ABNORMAL LOW (ref 8.9–10.3)
Chloride: 103 mmol/L (ref 98–111)
Creatinine, Ser: 0.94 mg/dL (ref 0.61–1.24)
GFR calc Af Amer: >60 mL/min (ref >60)
GFR calc non Af Amer: >60 mL/min (ref >60)
Glucose, Bld: 150 mg/dL — ABNORMAL HIGH (ref 70–99)
Potassium: 4.2 mmol/L (ref 3.5–5.1)
Sodium: 135 mmol/L (ref 135–145)

## 2019-08-05 MED ORDER — VITAMIN D-3 25 MCG (1000 UT) PO CAPS: 1000.0000 [IU] | ORAL_CAPSULE | Freq: Every day | ORAL | 3 refills | Status: DC

## 2019-08-05 MED ORDER — ASPIRIN 81 MG PO TBEC: 81.0000 mg | DELAYED_RELEASE_TABLET | Freq: Every day | ORAL | 3 refills | Status: DC

## 2019-08-05 MED ORDER — CLOPIDOGREL BISULFATE 75 MG PO TABS: 75.0000 mg | ORAL_TABLET | Freq: Every day | ORAL | 3 refills | Status: AC

## 2019-08-05 MED ORDER — ROSUVASTATIN CALCIUM 40 MG PO TABS: 40.0000 mg | ORAL_TABLET | Freq: Every day | ORAL | 3 refills | Status: AC

## 2019-08-05 MED ORDER — CHLORHEXIDINE GLUCONATE CLOTH 2 % EX PADS: 6.0000 | MEDICATED_PAD | Freq: Every day | CUTANEOUS | Status: DC

## 2019-08-05 MED ORDER — PANTOPRAZOLE SODIUM 40 MG PO TBEC: 40.0000 mg | DELAYED_RELEASE_TABLET | Freq: Every day | ORAL | 3 refills | Status: AC

## 2019-08-05 MED ORDER — OXYCODONE-ACETAMINOPHEN 5-325 MG PO TABS: 1.0000 | ORAL_TABLET | Freq: Four times a day (QID) | ORAL | 0 refills | Status: DC | PRN

## 2019-08-05 NOTE — Anesthesia Postprocedure Evaluation (Signed)
Anesthesia Post Note  Patient: Russell Herman  Procedure(s) Performed: ENDARTERECTOMY CAROTID (Right )  Patient location during evaluation: SICU Anesthesia Type: General Level of consciousness: awake and alert, oriented and patient cooperative Pain management: pain level controlled Vital Signs Assessment: post-procedure vital signs reviewed and stable Respiratory status: spontaneous breathing, nonlabored ventilation and respiratory function stable Cardiovascular status: stable Postop Assessment: no apparent nausea or vomiting Anesthetic complications: no   No complications documented.   Last Vitals:  Vitals:   08/05/19 0400 08/05/19 0600  BP: 112/70 121/71  Pulse: 60 79  Resp: 11 14  Temp: 37 C   SpO2: 100% 93%    Last Pain:  Vitals:   08/05/19 0400  TempSrc: Oral  PainSc: [name]

## 2019-08-05 NOTE — Discharge Summary (Signed)
[facility]    Discharge Summary  Patient ID:  Russell Herman MRN: [id number] DOB/AGE: [birth year] 74 y.o.  Admit date: 08/04/2019 Discharge date: 08/05/2019 Date of Surgery: 08/04/2019 Surgeon: Surgeon(s): Algernon Huxley, MD  Admission Diagnosis: Carotid stenosis, right [I65.21]  Discharge Diagnoses:  Carotid stenosis, right [I65.21]  Secondary Diagnoses: Past Medical History:  Diagnosis Date  . Adenomatous colon polyp   . Allergy   . Arthritis   . Cancer ([place])   . Coronary artery disease   . GERD (gastroesophageal reflux disease)   . Hypertension   . Wears hearing aid in both ears    Procedure(s): 08/04/19: 1.  Right carotid endarterectomy with CorMatrix arterial patch reconstruction  Discharged Condition: Good  HPI / Hospital Course:  Russell Herman is a 74 y.o. male who presents with right carotid stenosis of 80%.  I discussed with the patient the risks, benefits, and alternatives to carotid endarterectomy.  I discussed the differences between carotid stenting and carotid endarterectomy. I discussed the procedural details of carotid endarterectomy with the patient.  On August 04, 2019, the patient underwent:  1.  Right carotid endarterectomy with CorMatrix arterial patch reconstruction  He tolerated procedure well was transferred from the recovery room to the ICU for observation overnight.  The patient's that of surgery was unremarkable.  During his inpatient stay, his diet was advanced, he was urinating, his discomfort was controlled with use of p.o. pain medication he was ambulating at baseline.  Day of discharge the patient was afebrile with stable vital signs.  Physical exam:  Alert and oriented x3, no acute distress Face: Symmetrical, tongue midline Neck:  Incision: Clean dry intact.  Minimal swelling.  Trachea midline Cardiovascular: Regular rate and rhythm Pulmonary: Clear to auscultation Abdomen: Nontender, nondistended, positive bowel  sounds Extremities  Bilateral: Warm distally to toes.  Minimal edema.  Good capillary refill.  Motor/sensory intact Neurological: No deficits on exam.  Neurologically intact  Labs: As below  Complications: None  Consults: None  Significant Diagnostic Studies: CBC Lab Results  Component Value Date   WBC 22.2 (H) 08/05/2019   HGB 13.8 08/05/2019   HCT 39.3 08/05/2019   MCV 84.2 08/05/2019   PLT 336 08/05/2019   BMET    Component Value Date/Time   NA 135 08/05/2019 0500   K 4.2 08/05/2019 0500   CL 103 08/05/2019 0500   CO2 19 (L) 08/05/2019 0500   GLUCOSE 150 (H) 08/05/2019 0500   BUN 19 08/05/2019 0500   CREATININE 0.94 08/05/2019 0500   CALCIUM 8.8 (L) 08/05/2019 0500   GFRNONAA >60 08/05/2019 0500   GFRAA >60 08/05/2019 0500   COAG Lab Results  Component Value Date   INR 1.1 08/03/2019   INR 1.1 07/05/2019   Disposition:  Discharge to :Home  Allergies as of 08/05/2019      Reactions   Losartan Other (See Comments)   rash   Chlorthalidone Nausea And Vomiting   Can tolerate if taking with Omeprazole   Keflex [cephalexin]    "minor reaction"   Statins Rash   Myalgias, muscle pain/weakness   Sulfa Antibiotics Rash   Sulfamethoxazole-trimethoprim Rash      Medication List    STOP taking these medications   montelukast 10 MG tablet Commonly known as: SINGULAIR     TAKE these medications   aspirin 81 MG EC tablet Take 1 tablet (81 mg total) by mouth daily.   clopidogrel 75 MG tablet Commonly known as: PLAVIX Take  1 tablet (75 mg total) by mouth daily.   diphenhydrAMINE 25 MG tablet Commonly known as: BENADRYL Take 25 mg by mouth every 6 (six) hours as needed.   fluticasone 50 MCG/ACT nasal spray Commonly known as: FLONASE Place 2 sprays into both nostrils daily.   oxyCODONE-acetaminophen 5-325 MG tablet Commonly known as: PERCOCET/ROXICET Take 1 tablet by mouth every 6 (six) hours as needed for moderate pain.   pantoprazole 40 MG  tablet Commonly known as: PROTONIX Take 1 tablet (40 mg total) by mouth daily.   rosuvastatin 40 MG tablet Commonly known as: CRESTOR Take 1 tablet (40 mg total) by mouth at bedtime.   Vitamin D-3 25 MCG (1000 UT) Caps Take 1 capsule (1,000 Units total) by mouth daily at 12 noon.      Verbal and written Discharge instructions given to the patient. Wound care per Discharge AVS  Follow-up Information    Kris Hartmann, NP Follow up in 1 month(s).   Specialty: Vascular Surgery Why: First postop visit.  Patient will need a carotid duplex with visit. Contact information: [place] [address] [phone]        Kris Hartmann, NP.   Specialty: Vascular Surgery Contact information: [place] [address] [phone]              Signed: Sela Hua, PA-C  08/05/2019, 12:33 PM

## 2019-08-05 NOTE — Progress Notes (Signed)
Patient up to chair all day and walked around the unit- vitals stable- reviewed discharge paperwork with patient and his wife.  Patient then discharged home.

## 2019-08-05 NOTE — Discharge Instructions (Signed)
Vascular Surgery Discharge Instructions:  1) You may shower as of Saturday.  Please keep your incision clean and dry.  Gently wash with soap and water and gently pat dry. 2) The Dermabond or glue that is covering your incision will fall off on its own over the next week or 2.  Please do not pick at the incision. 3) No strenuous activity or lifting greater than 10 pounds for at least 3 weeks. 4) No driving for at least 3 weeks. 5) No driving if you are taking pain medication.

## 2019-08-09 LAB — SURGICAL PATHOLOGY

## 2019-08-15 DIAGNOSIS — E785 Hyperlipidemia, unspecified: Secondary | ICD-10-CM

## 2019-08-15 DIAGNOSIS — R944 Abnormal results of kidney function studies: Secondary | ICD-10-CM

## 2019-08-15 DIAGNOSIS — I69354 Hemiplegia and hemiparesis following cerebral infarction affecting left non-dominant side: Secondary | ICD-10-CM

## 2019-08-15 DIAGNOSIS — N179 Acute kidney failure, unspecified: Secondary | ICD-10-CM | POA: Insufficient documentation

## 2019-08-29 ENCOUNTER — Encounter: Payer: Self-pay | Admitting: Vascular Surgery

## 2019-09-02 ENCOUNTER — Encounter: Payer: Medicare Other | Attending: Physical Medicine & Rehabilitation | Admitting: Physical Medicine & Rehabilitation

## 2019-09-02 ENCOUNTER — Other Ambulatory Visit: Payer: Self-pay

## 2019-09-02 ENCOUNTER — Encounter: Payer: Self-pay | Admitting: Physical Medicine & Rehabilitation

## 2019-09-02 VITALS — BP 131/81 | HR 72 | Temp 98.2°F | Ht 65.0 in | Wt 154.0 lb

## 2019-09-02 DIAGNOSIS — I69354 Hemiplegia and hemiparesis following cerebral infarction affecting left non-dominant side: Secondary | ICD-10-CM | POA: Insufficient documentation

## 2019-09-02 DIAGNOSIS — I639 Cerebral infarction, unspecified: Secondary | ICD-10-CM

## 2019-09-02 NOTE — Patient Instructions (Signed)
Get ok from Dr Lucky Cowboy     Graduated return to driving instructions were provided. It is recommended that the patient first drives with another licensed driver in an empty parking lot. If the patient does well with this, and they can drive on a quiet street with the licensed driver. If the patient does well with this they can drive on a busy street with a licensed driver. If the patient does well with this, the next time out they can go by himself. For the first month after resuming driving, I recommend no nighttime or Interstate driving.

## 2019-09-02 NOTE — Progress Notes (Signed)
Subjective:    [name], male    DOB: Aug 25, 1945, 74 y.o.   MRN: [id number] 74 y.o. RH- male with history of HTN, GERD, BCC who was admitted to [facility] on 07/05/19 after waking up with left sided weakness, numbness and difficulty walking.  CT head was negative for acute changes.  MRI brain revealed small acute infarct in right lateral thalamus and mild small vessel disease.  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  Stroke was felt to be due to small vessel disease and neurology recommending DAPT x3 months.  Dr. Lucky Cowboy was consulted for input on CAS and plans for surgical intervention on outpatient basis.  Therapy has been ongoing and patient continues to be limited by left-sided weakness with left knee instability and decrease in coordination of LUE.  Admit date: 07/07/2019 Discharge date: 07/15/2019 HPI  7/1- RIght CEA  Has not had any therapy since discharge , though 3 people have been out to the home to get papers signed  Left hemiparesis and numbness, Left shoulder and Left thigh  Walking 1/2 miles per day Occ uses the walker to amb at night Mod I dressing and bathing  No vision issues  Pain Inventory Average Pain 3 Pain Right Now 3 My pain is tingling and aching  In the last 24 hours, has pain interfered with the following? General activity 1 Relation with others 0 Enjoyment of life 5 What TIME of day is your pain at its worst? daytime Sleep (in general) Good  Pain is worse with: sitting Pain improves with: therapy/exercise Relief from Meds: 4  Mobility walk without assistance use a walker ability to climb steps?  yes do you drive?  no  Function retired  Neuro/Psych numbness tingling trouble walking  Prior Studies Any changes since last visit?  no  Physicians involved in your care Any changes since last visit?  no  Has seen PCP and sees surgeon on  Monday   Family History  Problem Relation Age of Onset  . Breast cancer Mother   . Melanoma Father        mets to lung  . Breast cancer Sister   . Kidney failure Brother        s/p transplant   Social History   Socioeconomic History  . Marital status: Married    Spouse name: Not on file  . Number of children: Not on file  . Years of education: Not on file  . Highest education level: Not on file  Occupational History  . Not on file  Tobacco Use  . Smoking status: Never Smoker  . Smokeless tobacco: Never Used  . Tobacco comment: smoked "some" as teenager  Vaping Use  . Vaping Use: Never used  Substance and Sexual Activity  . Alcohol use: Not Currently    Alcohol/week: 0.0 standard drinks  . Drug use: Not Currently  . Sexual activity: Not on file  Other Topics Concern  . Not on file  Social History Narrative  . Not on file   Social Determinants of Health   Financial Resource Strain:   . Difficulty of Paying Living Expenses:   Food Insecurity:   . Worried About Charity fundraiser in the Last Year:   . Arboriculturist in the Last Year:   Transportation Needs:   . Film/video editor (Medical):   Marland Kitchen Lack of Transportation (Non-Medical):   Physical  Activity:   . Days of Exercise per Week:   . Minutes of Exercise per Session:   Stress:   . Feeling of Stress :   Social Connections:   . Frequency of Communication with Friends and Family:   . Frequency of Social Gatherings with Friends and Family:   . Attends Religious Services:   . Active Member of Clubs or Organizations:   . Attends Archivist Meetings:   Marland Kitchen Marital Status:    Past Surgical History:  Procedure Laterality Date  . cyst removed    . ENDARTERECTOMY Right 08/04/2019   Procedure: ENDARTERECTOMY CAROTID;  Surgeon: Algernon Huxley, MD;  Location: [facility];  Service: Vascular;  Laterality: Right;  . EXCISION OF TONGUE LESION Bilateral 05/05/2019   Procedure: EXCISION OF DORSAL TONGUE LESION;   Surgeon: Margaretha Sheffield, MD;  Location: [facility];  Service: ENT;  Laterality: Bilateral;  . skin cancer removed     Past Medical History:  Diagnosis Date  . Adenomatous colon polyp   . Allergy   . Arthritis   . Cancer ([place])   . Coronary artery disease   . GERD (gastroesophageal reflux disease)   . Hypertension   . Wears hearing aid in both ears    BP (!) 131/81   Pulse 72   Temp 98.2 F (36.8 C)   Ht 5\' 5"  (1.651 m)   Wt 154 lb (69.9 kg)   SpO2 94%   BMI 25.63 kg/m   Opioid Risk Score:   Fall Risk Score:  `1  Depression screen PHQ 2/9  Depression screen PHQ 2/9 09/02/2019  Decreased Interest 0  Down, Depressed, Hopeless 0  PHQ - 2 Score 0  Altered sleeping 0  Tired, decreased energy 3  Change in appetite 0  Feeling bad or failure about yourself  0  Trouble concentrating 0  Moving slowly or fidgety/restless 3  Suicidal thoughts 0  PHQ-9 Score 6  Difficult doing work/chores Somewhat difficult    Review of Systems  Constitutional: Negative.   HENT: Negative.   Eyes: Negative.   Respiratory: Negative.   Cardiovascular: Negative.   Gastrointestinal: Negative.   Endocrine: Negative.   Genitourinary: Negative.   Musculoskeletal: Positive for gait problem.       Left shoulder pain  Skin: Positive for rash.       Right abd- pcp aware  Allergic/Immunologic: Negative.   Neurological: Positive for numbness.       Tingling  Hematological: Bruises/bleeds easily.       Plavix  Psychiatric/Behavioral: Negative.   All other systems reviewed and are negative.      Objective:   Physical Exam Vitals and nursing note reviewed.  Constitutional:      Appearance: Normal appearance.  HENT:     Head: Normocephalic and atraumatic.  Eyes:     Extraocular Movements: Extraocular movements intact.     Conjunctiva/sclera: Conjunctivae normal.     Pupils: Pupils are equal, round, and reactive to light.  Musculoskeletal:        General: Normal range of motion.      Right lower leg: No edema.     Left lower leg: No edema.  Skin:    General: Skin is warm and dry.  Neurological:     Mental Status: He is oriented to person, place, and time.  Psychiatric:        Mood and Affect: Mood normal.        Behavior: Behavior normal.  Motor strength is 5/5 in the right deltoid, bicep, tricep, grip, hip flexor, knee extensor, ankle dorsiflexor 4+ on the left deltoid, bicep, tricep, grip, hip flexion, knee extensor ankle dorsiflexor There is ataxia on finger-nose-finger testing on the left side, mild Heel-to-shin on the left side mild dysmetria Ambulates without assistive device no evidence of toe drag or knee instability.  Patient is unable to form toe walk, unable to form tandem gait.      Assessment & Plan:  1.  Right lateral thalamic infarct mild ataxia and mild hemiparesis.  Still has gait dysfunction related to CVA.  Also has left upper extremity coordination issues.  He would benefit from outpatient PT OT will make referral He will follow-up with vascular surgery status post CEA right ICA If okay with vascular surgery, patient may resume driving as below  Graduated return to driving instructions were provided. It is recommended that the patient first drives with another licensed driver in an empty parking lot. If the patient does well with this, and they can drive on a quiet street with the licensed driver. If the patient does well with this they can drive on a busy street with a licensed driver. If the patient does well with this, the next time out they can go by himself. For the first month after resuming driving, I recommend no nighttime or Interstate driving.

## 2019-09-05 ENCOUNTER — Encounter (INDEPENDENT_AMBULATORY_CARE_PROVIDER_SITE_OTHER): Payer: Self-pay | Admitting: Nurse Practitioner

## 2019-09-05 ENCOUNTER — Ambulatory Visit (INDEPENDENT_AMBULATORY_CARE_PROVIDER_SITE_OTHER): Payer: Medicare Other | Admitting: Nurse Practitioner

## 2019-09-05 ENCOUNTER — Other Ambulatory Visit (INDEPENDENT_AMBULATORY_CARE_PROVIDER_SITE_OTHER): Payer: Self-pay | Admitting: Vascular Surgery

## 2019-09-05 ENCOUNTER — Ambulatory Visit (INDEPENDENT_AMBULATORY_CARE_PROVIDER_SITE_OTHER): Payer: Medicare Other

## 2019-09-05 ENCOUNTER — Other Ambulatory Visit: Payer: Self-pay

## 2019-09-05 VITALS — BP 136/76 | HR 67 | Resp 16 | Wt 153.6 lb

## 2019-09-05 DIAGNOSIS — I6521 Occlusion and stenosis of right carotid artery: Secondary | ICD-10-CM

## 2019-09-05 DIAGNOSIS — Z9889 Other specified postprocedural states: Secondary | ICD-10-CM | POA: Diagnosis not present

## 2019-09-05 DIAGNOSIS — K219 Gastro-esophageal reflux disease without esophagitis: Secondary | ICD-10-CM

## 2019-09-05 DIAGNOSIS — I10 Essential (primary) hypertension: Secondary | ICD-10-CM

## 2019-09-05 DIAGNOSIS — E785 Hyperlipidemia, unspecified: Secondary | ICD-10-CM

## 2019-09-06 ENCOUNTER — Encounter (INDEPENDENT_AMBULATORY_CARE_PROVIDER_SITE_OTHER): Payer: Self-pay | Admitting: Nurse Practitioner

## 2019-09-06 NOTE — Progress Notes (Signed)
Subjective:    [name], male    DOB: 30-Jul-1945, 74 y.o.   MRN: [id number] Chief Complaint  Patient presents with  . Follow-up    64month [facility] post carotid cea    The patient is seen for follow up evaluation of carotid stenosis status post right carotid endarterectomy on 08/04/2019.  There were no post operative problems or complications related to the surgery.  The patient denies neck or incisional pain.  The patient denies interval amaurosis fugax. There is no recent history of TIA symptoms or focal motor deficits.  The patient is still recovering from his most recent CVA.  He will be beginning outpatient rehab therapy soon.   The patient denies headache.  The patient is taking enteric-coated aspirin 81 mg daily.  The patient has a history of coronary artery disease, no recent episodes of angina or shortness of breath. The patient denies PAD or claudication symptoms. There is a history of hyperlipidemia which is being treated with a statin.   Today noninvasive studies show 1 to 39% stenosis bilaterally.  There is a patent right endarterectomy.  Normal flow hemodynamics seen in the vertebral and subclavian arteries.   Review of Systems  Skin: Positive for wound.  Neurological: Positive for weakness.  All other systems reviewed and are negative.      Objective:   Physical Exam Vitals reviewed.  HENT:     Head: Normocephalic.  Neck:     Vascular: No carotid bruit.  Cardiovascular:     Rate and Rhythm: Normal rate and regular rhythm.     Pulses: Normal pulses.  Pulmonary:     Effort: Pulmonary effort is normal.  Skin:    General: Skin is warm and dry.     Comments: Incision, clean dry and intact  Neurological:     Mental Status: He is alert and oriented to person, place, and time.  Psychiatric:        Mood and Affect: Mood normal.        Behavior: Behavior normal.        Thought Content: Thought content normal.        Judgment: Judgment normal.      BP 136/76 (BP Location: Right Arm)   Pulse 67   Resp 16   Wt 153 lb 9.6 oz (69.7 kg)   BMI 25.56 kg/m   Past Medical History:  Diagnosis Date  . Adenomatous colon polyp   . Allergy   . Arthritis   . Cancer ([place])   . Coronary artery disease   . GERD (gastroesophageal reflux disease)   . Hypertension   . Wears hearing aid in both ears     Social History   Socioeconomic History  . Marital status: Married    Spouse name: Not on file  . Number of children: Not on file  . Years of education: Not on file  . Highest education level: Not on file  Occupational History  . Not on file  Tobacco Use  . Smoking status: Never Smoker  . Smokeless tobacco: Never Used  . Tobacco comment: smoked "some" as teenager  Vaping Use  . Vaping Use: Never used  Substance and Sexual Activity  . Alcohol use: Not Currently    Alcohol/week: 0.0 standard drinks  . Drug use: Not Currently  . Sexual activity: Not on file  Other Topics Concern  . Not on file  Social History Narrative  . Not on file   Social Determinants of  Health   Financial Resource Strain:   . Difficulty of Paying Living Expenses:   Food Insecurity:   . Worried About Charity fundraiser in the Last Year:   . Arboriculturist in the Last Year:   Transportation Needs:   . Film/video editor (Medical):   Marland Kitchen Lack of Transportation (Non-Medical):   Physical Activity:   . Days of Exercise per Week:   . Minutes of Exercise per Session:   Stress:   . Feeling of Stress :   Social Connections:   . Frequency of Communication with Friends and Family:   . Frequency of Social Gatherings with Friends and Family:   . Attends Religious Services:   . Active Member of Clubs or Organizations:   . Attends Archivist Meetings:   Marland Kitchen Marital Status:   Intimate Partner Violence:   . Fear of Current or Ex-Partner:   . Emotionally Abused:   Marland Kitchen Physically Abused:   . Sexually Abused:     Past Surgical History:   Procedure Laterality Date  . cyst removed    . ENDARTERECTOMY Right 08/04/2019   Procedure: ENDARTERECTOMY CAROTID;  Surgeon: Algernon Huxley, MD;  Location: [facility];  Service: Vascular;  Laterality: Right;  . EXCISION OF TONGUE LESION Bilateral 05/05/2019   Procedure: EXCISION OF DORSAL TONGUE LESION;  Surgeon: Margaretha Sheffield, MD;  Location: [place];  Service: ENT;  Laterality: Bilateral;  . skin cancer removed      Family History  Problem Relation Age of Onset  . Breast cancer Mother   . Melanoma Father        mets to lung  . Breast cancer Sister   . Kidney failure Brother        s/p transplant    Allergies  Allergen Reactions  . Losartan Other (See Comments)    rash  . Chlorthalidone Nausea And Vomiting    Can tolerate if taking with Omeprazole  . Keflex [Cephalexin]     "minor reaction"  . Statins Rash    Myalgias, muscle pain/weakness  . Sulfa Antibiotics Rash  . Sulfamethoxazole-Trimethoprim Rash       Assessment & Plan:   1. Carotid stenosis, right Recommend:  The patient is s/p successful right CEA  Duplex ultrasound preoperatively shows 1-39% contralateral stenosis.  Continue antiplatelet therapy as prescribed Continue management of CAD, HTN and Hyperlipidemia Healthy heart diet,  encouraged exercise at least 4 times per week  Follow up in 3 months with duplex ultrasound and physical exam based on the patient's carotid surgery     2. Dyslipidemia Continue statin as ordered and reviewed, no changes at this time   3. Essential hypertension Continue antihypertensive medications as already ordered, these medications have been reviewed and there are no changes at this time.   4. GERD without esophagitis Continue PPI as already ordered, this medication has been reviewed and there are no changes at this time.  Avoidence of caffeine and alcohol  Moderate elevation of the head of the bed    Current Outpatient Medications on File Prior to Visit   Medication Sig Dispense Refill  . aspirin 81 MG EC tablet Take 1 tablet (81 mg total) by mouth daily. 90 tablet 3  . Cholecalciferol (VITAMIN D-3) 25 MCG (1000 UT) CAPS Take 1 capsule (1,000 Units total) by mouth daily at 12 noon. 90 capsule 3  . clopidogrel (PLAVIX) 75 MG tablet Take 1 tablet (75 mg total) by mouth daily. 90 tablet  3  . diphenhydrAMINE (BENADRYL) 25 MG tablet Take 25 mg by mouth every 6 (six) hours as needed.    Mariane Baumgarten Calcium (STOOL SOFTENER PO) Take by mouth as needed.    . fluticasone (FLONASE) 50 MCG/ACT nasal spray Place 2 sprays into both nostrils daily.   4  . pantoprazole (PROTONIX) 40 MG tablet Take 1 tablet (40 mg total) by mouth daily. 90 tablet 3  . rosuvastatin (CRESTOR) 40 MG tablet Take 1 tablet (40 mg total) by mouth at bedtime. 90 tablet 3   No current facility-administered medications on file prior to visit.    There are no Patient Instructions on file for this visit. No follow-ups on file.   Kris Hartmann, NP

## 2019-09-14 ENCOUNTER — Encounter: Payer: Self-pay | Admitting: Physical Therapy

## 2019-09-14 ENCOUNTER — Encounter: Payer: Self-pay | Admitting: Occupational Therapy

## 2019-09-14 ENCOUNTER — Ambulatory Visit: Payer: Medicare Other | Attending: Physical Medicine & Rehabilitation | Admitting: Occupational Therapy

## 2019-09-14 ENCOUNTER — Other Ambulatory Visit: Payer: Self-pay

## 2019-09-14 ENCOUNTER — Ambulatory Visit: Payer: Medicare Other | Admitting: Physical Therapy

## 2019-09-14 DIAGNOSIS — M6281 Muscle weakness (generalized): Secondary | ICD-10-CM | POA: Diagnosis present

## 2019-09-14 DIAGNOSIS — R278 Other lack of coordination: Secondary | ICD-10-CM

## 2019-09-14 DIAGNOSIS — I6309 Cerebral infarction due to thrombosis of other precerebral artery: Secondary | ICD-10-CM | POA: Insufficient documentation

## 2019-09-14 DIAGNOSIS — I69354 Hemiplegia and hemiparesis following cerebral infarction affecting left non-dominant side: Secondary | ICD-10-CM

## 2019-09-14 NOTE — Therapy (Addendum)
[place] MAIN [facility] SERVICES [address] [place], [place], [address] Phone: [phone]   Fax:  [phone]  Occupational Therapy Evaluation  Patient Details  Name: Russell Herman MRN: [id number] Date of Birth: 1945/09/03 Referring Provider (OT): Dr. Letta Pate   Encounter Date: 09/14/2019   OT End of Session - 09/14/19 1004    Visit Number 1    Number of Visits 24    Date for OT Re-Evaluation 12/07/19    OT Start Time 0900    OT Stop Time 0945    OT Time Calculation (min) 45 min    Activity Tolerance Patient tolerated treatment well    Behavior During Therapy [facility] for tasks assessed/performed           Past Medical History:  Diagnosis Date  . Adenomatous colon polyp   . Allergy   . Arthritis   . Cancer ([place])   . Coronary artery disease   . GERD (gastroesophageal reflux disease)   . Hypertension   . Wears hearing aid in both ears     Past Surgical History:  Procedure Laterality Date  . cyst removed    . ENDARTERECTOMY Right 08/04/2019   Procedure: ENDARTERECTOMY CAROTID;  Surgeon: Algernon Huxley, MD;  Location: [facility] ORS;  Service: Vascular;  Laterality: Right;  . EXCISION OF TONGUE LESION Bilateral 05/05/2019   Procedure: EXCISION OF DORSAL TONGUE LESION;  Surgeon: Margaretha Sheffield, MD;  Location: [place];  Service: ENT;  Laterality: Bilateral;  . skin cancer removed      There were no vitals filed for this visit.   Subjective Assessment - 09/14/19 0956    Subjective  Pt. reports that he wants w=to be able to do the things that he was able to do prior to the CVA    Pertinent History Pt. is a 74 y.o. male who was hospitalized with a CVA on June 1st, 2021. Imaging revealed a small acute infarct in right lateral thalamus, and mild small vessel disease. Pt. received inpatient rehab services, and home health services. Pt. underwent  a Right Carotid Endartectomy on 08/04/2019. Pt. resides with his wife, and has supportive  family. Pt. is a retired Administrator for [employer], and enjoys gardening.    Currently in Pain? Yes    Pain Score 5     Pain Location Shoulder    Pain Orientation Left    Pain Descriptors / Indicators Tingling    Pain Type Acute pain             [facility] OT Assessment - 09/14/19 0001      Assessment   Medical Diagnosis CVA    Referring Provider (OT) Dr. Letta Pate    Onset Date/Surgical Date 07/05/19    Hand Dominance Right      Precautions   Precautions None      Restrictions   Weight Bearing Restrictions No      Balance Screen   Has the patient fallen in the past 6 months No    Has the patient had a decrease in activity level because of a fear of falling?  No    Is the patient reluctant to leave their home because of a fear of falling?  No      Home  Environment   Family/patient expects to be discharged to: Private residence    Living Arrangements Spouse/significant other    Available Help at Discharge Family    Type of [place]  Access Ramped entrance    Home Layout Two level    Bathroom Shower/Tub Tub/Shower unit;Curtain    Bathroom Toilet Handicapped height    How accessible Accessible via Management consultant - 2 wheels    Lives With Spouse      Prior Function   Level of Independence Independent    Vocation Retired    Leisure Gardening, [place], Arboriculturist work on car      ADL   Eating/Feeding Independent   incoordination with cutting food.   Grooming Independent    Upper Body Bathing Independent    Lower Body Bathing Independent    Upper Body Dressing Independent    Lower Body Dressing Increased time    Toilet Transfer Independent    Toileting - Social research officer, government -  Hygiene Independent    Tub/Shower Transfer Modified independent   Using a shower chair     IADL   Prior Level of Function Shopping Independent    Shopping Needs to be accompanied on any shopping trip    Prior Level of Function Light  Housekeeping Independent    Light Housekeeping Needs help with all home maintenance tasks    Prior Level of Function Meal Prep Independent    Meal Prep Able to complete simple warm meal prep    Prior Level of Function Community Mobility Independent    Community Mobility Relies on family or friends for transportation    Prior Level of Function Medication Managment Independent    Medication Management Is responsible for taking medication in correct dosages at correct time    Prior Level of Function Therapist, sports financial matters independently (budgets, writes checks, pays rent, bills goes to bank), collects and keeps track of income      Mobility   Mobility Status Independent      Written Expression   Dominant Hand Right    Handwriting 100% legible    Written Experience Within Functional Limits      Vision - History   Baseline Vision Wears glasses only for reading    Additional Comments Hearing Aids      Activity Tolerance   Activity Tolerance Tolerate 30+ min activity without fatigue      Cognition   Overall Cognitive Status Within Functional Limits for tasks assessed      Sensation   Light Touch Appears Intact    Proprioception Appears Intact      Coordination   Gross Motor Movements are Fluid and Coordinated No    Fine Motor Movements are Fluid and Coordinated Yes    Right 9 Hole Peg Test 23    Left 9 Hole Peg Test 31      Strength   Overall Strength Comments LUE shoulder abduction, flexion 4/5, elbow flexion, extension, and wrist extension:5/5, RUE 5/5      Hand Function   Right Hand Grip (lbs) 57    Right Hand Lateral Pinch 15 lbs    Right Hand 3 Point Pinch 16 lbs    Left Hand Grip (lbs) 54    Left Hand Lateral Pinch 17 lbs    Left 3 point pinch 15 lbs                           OT Education - 09/14/19 1004    Education Details OT services, goal, POC.    Person(s) Educated Patient  Methods  Explanation    Comprehension Verbalized understanding;Need further instruction               OT Long Term Goals - 09/14/19 1649      OT LONG TERM GOAL #1   Title Pt. will improve LUE strength by 1 muscle grade to assist with gardening, and yardwork.    Baseline Eval: 4/5 shoulder flexion, abduction. Pt. is unable to garden, and perform yardwork    Time 12    Period Weeks    Status New    Target Date 12/07/19      OT LONG TERM GOAL #2   Title Pt. will improve FOTO score by 2 points to maximize maximize independence with ADLs, and IADL functioning.    Baseline Intake score: 59    Time 12    Period Weeks    Status New    Target Date 12/07/19      OT LONG TERM GOAL #3   Title Pt will improve Left hand [facility] skills in order to be able to manipulate small objects    Baseline Eval: Left hand [facility] skills are limited. pt. has difficulty manipulating small objects efficiently during ADLs, and IADLs.    Time 12    Period Weeks    Status New    Target Date 12/07/19      OT LONG TERM GOAL #4   Title Pt. will imprive left hand motor control skills to be able to independently brush his hair with his LUE.    Baseline Eval: Pt. has difficulty    Time 12    Period Weeks    Status New    Target Date 12/07/19      OT LONG TERM GOAL #5   Title Pt. will use his left hand to be able to independently hold, and use a washcloth    Baseline Eval: Pt. has difficulty    Time 12    Period Weeks    Status New    Target Date 12/07/19                 Plan - 09/14/19 1005    Clinical Impression Statement Pt. is a 74 y.o. male who was diagnosed with a CVA with left sided weakness. The CVA occurred on June 1st, 2021. Pt. underwent a right carotid endartectomy on 08/05/2019. Pt. presents with left sided weakness, impaired left shoulder strength, impaired left hand function, and [facility] skills which limit his ability to perfrom daily ADL ,and IADL tasks. Pt.'s FOTO intake score is 59. Pt. will  benefit from OT services to improve LUE strength, and [facility] skills in order to be able to use his left hand to brush his hair, use a washcloth, open jars, reach up to to place items on shelves, and perform yardwork, and gardening.    OT Occupational Profile and History Problem Focused Assessment - Including review of records relating to presenting problem    Occupational performance deficits (Please refer to evaluation for details): ADL's;IADL's    Body Structure / Function / Physical Skills ADL;IADL;Balance;Proprioception;Strength;Coordination;UE functional use;FMC;Dexterity    Rehab Potential Good    Clinical Decision Making Several treatment options, min-mod task modification necessary    Comorbidities Affecting Occupational Performance: May have comorbidities impacting occupational performance    Modification or Assistance to Complete Evaluation  Min-Moderate modification of tasks or assist with assess necessary to complete eval    OT Frequency 2x / week    OT Duration 12 weeks  OT Treatment/Interventions Self-care/ADL training;Therapeutic exercise;DME and/or AE instruction;Patient/family education;Therapeutic activities;Energy conservation;Neuromuscular education    Consulted and Agree with Plan of Care Patient           Patient will benefit from skilled therapeutic intervention in order to improve the following deficits and impairments:   Body Structure / Function / Physical Skills: ADL, IADL, Balance, Proprioception, Strength, Coordination, UE functional use, FMC, Dexterity       Visit Diagnosis: Muscle weakness (generalized) - Plan: Ot plan of care cert/re-cert  Other lack of coordination - Plan: Ot plan of care cert/re-cert    Problem List Patient Active Problem List   Diagnosis Date Noted  . Carotid stenosis, right 08/04/2019  . Carotid stenosis 07/29/2019  . Carotid artery plaque, right 07/19/2019  . Hx of completed stroke 07/19/2019  . AKI (acute kidney injury) ([place])    . 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 Decreased GFR   . Dyslipidemia   . Stroke ([place]) 07/06/2019  . TIA (transient ischemic attack) 07/05/2019  . Hypokalemia 07/05/2019  . GERD without esophagitis 06/18/2017  . Essential hypertension 08/19/2016  . Vertigo 12/05/2015  . Adenomatous colon polyp 01/31/2014  . Basal cell carcinoma 09/23/2013  . Hay fever 09/23/2013  . Vitamin D deficiency 09/23/2013  . Gastric catarrh 11/28/2011  . Hyperlipidemia, mixed 11/28/2011  . Preop cardiovascular exam 11/28/2011    Harrel Carina, MS, OTR/L 09/14/2019, 4:59 PM  [place] MAIN [facility] SERVICES [address] [place], [place], [address] Phone: [phone]   Fax:  [phone]  Name: Russell Herman MRN: [id number] Date of Birth: Nov 08, 1945

## 2019-09-14 NOTE — Therapy (Signed)
[place] MAIN [facility] SERVICES [address] [place], [place], [address] Phone: [phone]   Fax:  [phone]  Physical Therapy Evaluation  Patient Details  Name: Russell Herman MRN: [id number] Date of Birth: 08-Jan-1946 No data recorded  Encounter Date: 09/14/2019   PT End of Session - 09/14/19 1005    Visit Number 1    Number of Visits 17    Date for PT Re-Evaluation 12/13/19    PT Start Time 1000    PT Stop Time 1100    PT Time Calculation (min) 60 min    Equipment Utilized During Treatment Gait belt    Activity Tolerance Patient tolerated treatment well    Behavior During Therapy WFL for tasks assessed/performed           Past Medical History:  Diagnosis Date   Adenomatous colon polyp    Allergy    Arthritis    Cancer ([place])    Coronary artery disease    GERD (gastroesophageal reflux disease)    Hypertension    Wears hearing aid in both ears     Past Surgical History:  Procedure Laterality Date   cyst removed     ENDARTERECTOMY Right 08/04/2019   Procedure: ENDARTERECTOMY CAROTID;  Surgeon: Algernon Huxley, MD;  Location: [facility];  Service: Vascular;  Laterality: Right;   EXCISION OF TONGUE LESION Bilateral 05/05/2019   Procedure: EXCISION OF DORSAL TONGUE LESION;  Surgeon: Margaretha Sheffield, MD;  Location: [place];  Service: ENT;  Laterality: Bilateral;   skin cancer removed      There were no vitals filed for this visit.    Subjective Assessment - 09/14/19 1113    Subjective Patient had CVA 6/1 and went to [facility] rehab 6/3.    Pertinent History Right lateral thalamic infarct mild ataxia and mild hemiparesis. Patient went to rehab 07/07/19-07/15/19. Then he had surgery 7/1 for  s/p successful right CEA    Currently in Pain? No/denies    Pain Score 0-No pain              PAIN: no reports of pain  POSTURE:WNL    PROM/AROM: PROM BUE:WFL AROM BUEWFL PROM BLEWFL AROM BLE:WFL  STRENGTH:   Graded on a 0-5 scale Muscle Group Left Right                          Hip Flex /5 /5  Hip Abd /5 /5  Hip Add /5 /5  Hip Ext /5 /5  Hip IR/ER /5 /5  Knee Flex /5 /5  Knee Ext /5 /5  Ankle DF /5 /5  Ankle PF /5 /5   SENSATION:  BUE : L shoulder feels tight  BLE : L thigh feels tight, calf has tingling, foot feels twisted and cold  NEUROLOGICAL SCREEN: (2+ unless otherwise noted.) N=normal  Ab=abnormal   Level Dermatome R L  C3 Anterior Neck  N N  C4 Top of Shoulder N N  C5 Lateral Upper Arm  N N  C6 Lateral Arm/ Thumb  N N  C7 Middle Finger  N N  C8 4th & 5th Finger N N  T1 Medial Arm N N  L2 Medial thigh/groin N ab  L3 Lower thigh/med.knee N ab  L4 Medial leg/lat thigh N ab  L5 Lat. leg & dorsal foot N ab  S1 post/lat foot/thigh/leg N ab  S2 Post./med. thigh & leg N ab  SOMATOSENSORY:  Any N & T in extremities or weakness: reports :         Sensation           Intact      Diminished         Absent  Light touch  BUE  LLE thigh, calf and foot                             COORDINATION: Finger to Nose:   Slow speed and minimal pass pointing ...  FUNCTIONAL MOBILITY:MI due to being slow  BALANCE:  Static Standing Balance  Normal Able to maintain standing balance against maximal resistance   Good Able to maintain standing balance against moderate resistance x  Good-/Fair+ Able to maintain standing balance against minimal resistance   Fair Able to stand unsupported without UE support and without LOB for 1-2 min   Fair- Requires Min A and UE support to maintain standing without loss of balance   Poor+ Requires mod A and UE support to maintain standing without loss of balance   Poor Requires max A and UE support to maintain standing balance without loss    Standing Dynamic Balance  Normal Stand independently unsupported, able to weight shift and cross midline maximally   Good Stand independently unsupported, able to weight shift and cross midline moderately    Good-/Fair+ Stand independently unsupported, able to weight shift across midline minimally x  Fair Stand independently unsupported, weight shift, and reach ipsilaterally, loss of balance when crossing midline   Poor+ Able to stand with Min A and reach ipsilaterally, unable to weight shift   Poor Able to stand with Mod A and minimally reach ipsilaterally, unable to cross midline.       GAIT: Patient has mild deviation in path for gait without AD during intermediate distances, and slow turning during 180 deg turning  OUTCOME MEASURES: TEST Outcome Interpretation  5 times sit<>stand 17.90sec >60 yo, >15 sec indicates increased risk for falls  10 meter walk test        1.01         m/s <1.0 m/s indicates increased risk for falls; limited community ambulator  Timed up and Go    10.07             sec <14 sec indicates increased risk for falls  6 minute walk test        1140        Feet 1000 feet is community Water quality scientist 42/56 <36/56 (100% risk for falls), 37-45 (80% risk for falls); 46-51 (>50% risk for falls); 52-55 (lower risk <25% of falls)    LEFS 43/80: 80/80    Treatment: Leg press 55 lbs x 20 x 3,  40 lbs heel raises x 20 x 3  Objective measurements completed on examination: See above findings.               PT Education - 09/14/19 1004    Education Details plan of care    Person(s) Educated Patient    Methods Explanation    Comprehension Verbalized understanding            PT Short Term Goals - 09/14/19 1055      PT SHORT TERM GOAL #1   Title Patient will increase lower extremity functional scale to >60/80 to demonstrate improved functional mobility and increased tolerance with ADLs.    Baseline 09/14/19= 43/80  Time 6    Period Weeks    Status New    Target Date 11/02/19      PT SHORT TERM GOAL #2   Title Patient will be modified independent in walking on even/uneven surface with least restrictive assistive device, for 20+  minutes without rest break, reporting some difficulty or less to improve walking tolerance with community ambulation including grocery shopping, going to church,etc.    Baseline Patient can ambulate for 10 minutes on level surfaces without incline and without AD    Time 6    Period Weeks    Status New    Target Date 11/02/19             PT Long Term Goals - 09/14/19 1057      PT LONG TERM GOAL #1   Title Patient will increase BLE gross strength to 4+/5 as to improve functional strength for independent gait, increased standing tolerance and increased ADL ability.    Baseline LLE 3/5 hip abd, 3+/5 hip flex, knee flex/ext 4/5, ankle 4/5    Time 12    Period Weeks    Status New    Target Date 12/07/19      PT LONG TERM GOAL #2   Title Patient will increase Berg Balance score by > 6 points to demonstrate decreased fall risk during functional activities.    Baseline 09/14/19= 42/56    Time 12    Period Weeks    Status New    Target Date 12/07/19      PT LONG TERM GOAL #3   Title Patient will ascend/descend 4 stairs without rail assist independently without loss of balance to improve ability to get in/out of home.    Baseline 09/14/19= needs UE rail support and no AD    Time 12    Period Weeks    Status New    Target Date 12/07/19      PT LONG TERM GOAL #4   Title Patient will be independent in home exercise program to improve strength/mobility for better functional independence with ADLs.    Time 12    Period Weeks    Status New    Target Date 12/07/19                  Plan - 09/14/19 1006    Clinical Impression Statement Patient presents with decreased LLE strength, decreased gait speed, decreased standing balance.  Patient's main complaint is pain and inability to participate in desired activities. Patient will benefit from skilled therapy in order to increase LE strength and standing balance and increase gait speed and improve her ability to participate in desired  activities and decrease her falls risk.     Personal Factors and Comorbidities Age    Stability/Clinical Decision Making Stable/Uncomplicated    Clinical Decision Making Low    Rehab Potential Good    PT Frequency 2x / week    PT Duration 12 weeks    PT Treatment/Interventions Patient/family education;Balance training;Neuromuscular re-education;Therapeutic exercise;Stair training    PT Next Visit Plan balance and strengthening    Consulted and Agree with Plan of Care Patient           Patient will benefit from skilled therapeutic intervention in order to improve the following deficits and impairments:  Decreased activity tolerance, Decreased strength, Difficulty walking, Decreased balance, Decreased coordination, Decreased mobility, Decreased endurance  Visit Diagnosis: 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 (HCC)  Hemiparesis affecting left  side as late effect of stroke [facility])     Problem List Patient Active Problem List   Diagnosis Date Noted   Carotid stenosis, right 08/04/2019   Carotid stenosis 07/29/2019   Carotid artery plaque, right 07/19/2019   Hx of completed stroke 07/19/2019   AKI (acute kidney injury) ([place])    Hemiparesis affecting left side as late effect of stroke ([place])    Decreased GFR    Dyslipidemia    Stroke ([place]) 07/06/2019   TIA (transient ischemic attack) 07/05/2019   Hypokalemia 07/05/2019   GERD without esophagitis 06/18/2017   Essential hypertension 08/19/2016   Vertigo 12/05/2015   Adenomatous colon polyp 01/31/2014   Basal cell carcinoma 09/23/2013   Hay fever 09/23/2013   Vitamin D deficiency 09/23/2013   Gastric catarrh 11/28/2011   Hyperlipidemia, mixed 11/28/2011   Preop cardiovascular exam 11/28/2011    Alanson Puls , PT DPT 09/14/2019, 11:13 AM  [place] [address] [place], [place], [address] Phone:  [phone]   Fax:  [phone]  Name: Russell Herman MRN: [id number] Date of Birth: November 27, 1945

## 2019-09-14 NOTE — Addendum Note (Signed)
Addended by: Lucia Bitter on: 09/14/2019 05:03 PM   Modules accepted: Orders

## 2019-09-19 ENCOUNTER — Ambulatory Visit: Payer: Medicare Other | Admitting: Occupational Therapy

## 2019-09-19 ENCOUNTER — Ambulatory Visit: Payer: Medicare Other

## 2019-09-21 ENCOUNTER — Ambulatory Visit: Payer: Medicare Other | Admitting: Physical Therapy

## 2019-09-21 ENCOUNTER — Ambulatory Visit: Payer: Medicare Other | Admitting: Occupational Therapy

## 2019-09-21 ENCOUNTER — Encounter: Payer: Self-pay | Admitting: Occupational Therapy

## 2019-09-21 ENCOUNTER — Other Ambulatory Visit: Payer: Self-pay

## 2019-09-21 ENCOUNTER — Encounter: Payer: Self-pay | Admitting: Physical Therapy

## 2019-09-21 DIAGNOSIS — I6309 Cerebral infarction due to thrombosis of other precerebral artery: Secondary | ICD-10-CM

## 2019-09-21 DIAGNOSIS — R278 Other lack of coordination: Secondary | ICD-10-CM

## 2019-09-21 DIAGNOSIS — I69354 Hemiplegia and hemiparesis following cerebral infarction affecting left non-dominant side: Secondary | ICD-10-CM

## 2019-09-21 DIAGNOSIS — M6281 Muscle weakness (generalized): Secondary | ICD-10-CM

## 2019-09-21 NOTE — Therapy (Signed)
[place] MAIN [facility] SERVICES [address] [place], [place], [address] Phone: [phone]   Fax:  [phone]  Physical Therapy Treatment  Patient Details  Name: Russell Herman MRN: [id number] Date of Birth: 05-16-1945 No data recorded  Encounter Date: 09/21/2019   PT End of Session - 09/21/19 1026    Visit Number 2    Number of Visits 17    Date for PT Re-Evaluation 12/13/19    PT Start Time 9417    PT Stop Time 1100    PT Time Calculation (min) 45 min    Equipment Utilized During Treatment Gait belt    Activity Tolerance Patient tolerated treatment well    Behavior During Therapy WFL for tasks assessed/performed           Past Medical History:  Diagnosis Date  . Adenomatous colon polyp   . Allergy   . Arthritis   . Cancer ([place])   . Coronary artery disease   . GERD (gastroesophageal reflux disease)   . Hypertension   . Wears hearing aid in both ears     Past Surgical History:  Procedure Laterality Date  . cyst removed    . ENDARTERECTOMY Right 08/04/2019   Procedure: ENDARTERECTOMY CAROTID;  Surgeon: Algernon Huxley, MD;  Location: [facility];  Service: Vascular;  Laterality: Right;  . EXCISION OF TONGUE LESION Bilateral 05/05/2019   Procedure: EXCISION OF DORSAL TONGUE LESION;  Surgeon: Margaretha Sheffield, MD;  Location: [place];  Service: ENT;  Laterality: Bilateral;  . skin cancer removed      There were no vitals filed for this visit.   Subjective Assessment - 09/21/19 1025    Subjective Patient reports that he feels stiff in his legs.    Pertinent History Right lateral thalamic infarct mild ataxia and mild hemiparesis. Patient went to rehab 07/07/19-07/15/19. Then he had surgery 7/1 for  s/p successful right CEA    Currently in Pain? No/denies    Pain Score 0-No pain           Ther-ex  [employer] fitness  x 5 mins  Hamstring stretching BLE x 30 sec x 3  Calf stretching BLE x 30 sesc x 3 Hip flexion marches with 2#  ankle weights x 10 bilateral Hip abduction with 2# x 10 bilateral Hip extension with 2# x 10 bilateral Quantum leg press 25# x 20 x 3 sets  Squats x 15 with cues for correct posture, 5 sec hold Lunges to BOSU ball x 15 BLE, UE support and 15 reps without UE  support Heel raises x 15 x 2 sets Step ups from purple pad  to 6-inch stool x 20  Eccentric step downs from 3 inch stool x 10 verbal cues to complete slow and tap heel.  BUE CGA Patient needs occasional verbal cueing to improve posture and cueing to correctly perform exercises slowly, holding at end of range to increase motor firing of desired muscle to encourage fatigue.                           PT Education - 09/21/19 1026    Education Details HEP    Person(s) Educated Patient    Methods Explanation    Comprehension Need further instruction;Verbalized understanding;Returned demonstration            PT Short Term Goals - 09/14/19 1055      PT SHORT TERM GOAL #1  Title Patient will increase lower extremity functional scale to >60/80 to demonstrate improved functional mobility and increased tolerance with ADLs.    Baseline 09/14/19= 43/80    Time 6    Period Weeks    Status New    Target Date 11/02/19      PT SHORT TERM GOAL #2   Title Patient will be modified independent in walking on even/uneven surface with least restrictive assistive device, for 20+ minutes without rest break, reporting some difficulty or less to improve walking tolerance with community ambulation including grocery shopping, going to church,etc.    Baseline Patient can ambulate for 10 minutes on level surfaces without incline and without AD    Time 6    Period Weeks    Status New    Target Date 11/02/19             PT Long Term Goals - 09/14/19 1057      PT LONG TERM GOAL #1   Title Patient will increase BLE gross strength to 4+/5 as to improve functional strength for independent gait, increased standing tolerance and  increased ADL ability.    Baseline LLE 3/5 hip abd, 3+/5 hip flex, knee flex/ext 4/5, ankle 4/5    Time 12    Period Weeks    Status New    Target Date 12/07/19      PT LONG TERM GOAL #2   Title Patient will increase Berg Balance score by > 6 points to demonstrate decreased fall risk during functional activities.    Baseline 09/14/19= 42/56    Time 12    Period Weeks    Status New    Target Date 12/07/19      PT LONG TERM GOAL #3   Title Patient will ascend/descend 4 stairs without rail assist independently without loss of balance to improve ability to get in/out of home.    Baseline 09/14/19= needs UE rail support and no AD    Time 12    Period Weeks    Status New    Target Date 12/07/19      PT LONG TERM GOAL #4   Title Patient will be independent in home exercise program to improve strength/mobility for better functional independence with ADLs.    Time 12    Period Weeks    Status New    Target Date 12/07/19                 Plan - 09/21/19 1027    Clinical Impression Statement Patient instructed in beginning balance and coordination exercise. Patient required mod VCs and min A for stretching techniques.  Patient requires min VCs to improve ankle stability and strength with gait.  Patients would benefit from additional skilled PT intervention to improve balance/gait safety and reduce fall risk.   Personal Factors and Comorbidities Age;Comorbidity 1    Comorbidities HTN    Examination-Activity Limitations Locomotion Level;Stand    Examination-Participation Restrictions Driving;Yard Work;Meal Prep    Stability/Clinical Decision Making Stable/Uncomplicated    Rehab Potential Good    PT Frequency 2x / week    PT Duration 12 weeks    PT Treatment/Interventions Patient/family education;Balance training;Neuromuscular re-education;Therapeutic exercise;Stair training    PT Next Visit Plan balance and strengthening    Consulted and Agree with Plan of Care Patient            Patient will benefit from skilled therapeutic intervention in order to improve the following deficits and impairments:  Decreased activity tolerance,  Decreased strength, Difficulty walking, Decreased balance, Decreased coordination, Decreased mobility, Decreased endurance  Visit Diagnosis: 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 Carotid stenosis, right 08/04/2019  . Carotid stenosis 07/29/2019  . Carotid artery plaque, right 07/19/2019  . Hx of completed stroke 07/19/2019  . AKI (acute kidney injury) ([place])   . Hemiparesis affecting left side as late effect of stroke ([place])   . Decreased GFR   . Dyslipidemia   . Stroke ([place]) 07/06/2019  . TIA (transient ischemic attack) 07/05/2019  . Hypokalemia 07/05/2019  . GERD without esophagitis 06/18/2017  . Essential hypertension 08/19/2016  . Vertigo 12/05/2015  . Adenomatous colon polyp 01/31/2014  . Basal cell carcinoma 09/23/2013  . Hay fever 09/23/2013  . Vitamin D deficiency 09/23/2013  . Gastric catarrh 11/28/2011  . Hyperlipidemia, mixed 11/28/2011  . Preop cardiovascular exam 11/28/2011    Alanson Puls, [place] DPT 09/21/2019, 10:36 AM  [place] MAIN [facility] SERVICES [address] [place], [place], [address] Phone: [phone]   Fax:  [phone]  Name: Russell Herman MRN: [id number] Date of Birth: 1945/11/28

## 2019-09-21 NOTE — Therapy (Addendum)
[place] MAIN [facility] SERVICES [address] [place], [place], [address] Phone: [phone]   Fax:  [phone]  Occupational Therapy Treatment  Patient Details  Name: Russell Herman MRN: [id number] Date of Birth: August 06, 1945 Referring Provider (OT): Dr. Letta Pate   Encounter Date: 09/21/2019   OT End of Session - 09/21/19 1039    Visit Number 2    Number of Visits 24    Date for OT Re-Evaluation 12/07/19    OT Start Time 0930    OT Stop Time 1015    OT Time Calculation (min) 45 min    Activity Tolerance Patient tolerated treatment well    Behavior During Therapy [facility] for tasks assessed/performed           Past Medical History:  Diagnosis Date  . Adenomatous colon polyp   . Allergy   . Arthritis   . Cancer ([place])   . Coronary artery disease   . GERD (gastroesophageal reflux disease)   . Hypertension   . Wears hearing aid in both ears     Past Surgical History:  Procedure Laterality Date  . cyst removed    . ENDARTERECTOMY Right 08/04/2019   Procedure: ENDARTERECTOMY CAROTID;  Surgeon: Algernon Huxley, MD;  Location: [facility];  Service: Vascular;  Laterality: Right;  . EXCISION OF TONGUE LESION Bilateral 05/05/2019   Procedure: EXCISION OF DORSAL TONGUE LESION;  Surgeon: Margaretha Sheffield, MD;  Location: [place];  Service: ENT;  Laterality: Bilateral;  . skin cancer removed      There were no vitals filed for this visit.   Subjective Assessment - 09/21/19 1026    Subjective  Pt. reports that he was able to get out and mow his lawn  this past week.    Pertinent History Pt. is a 74 y.o. male who was hospitalized with a CVA on June 1st, 2021. Imaging revealed a small acute infarct in right lateral thalamus, and mild small vessel disease. Pt. received inpatient rehab services, and home health services. Pt. underwent  a Right Carotid Endartectomy on 08/04/2019. Pt. resides with his wife, and has supportive family. Pt. is a retired  Administrator for [employer], and enjoys gardening.    Currently in Pain? Yes    Pain Location Shoulder    Pain Orientation Left    Pain Descriptors / Indicators Aching;Tingling    Pain Type Acute pain          OT TREATMENT    Neuro muscular re-education:  Pt. performed [place] tasks using the Grooved pegboard. Pt. worked on grasping the grooved pegs from a horizontal position, and moving the pegs to a vertical position in the hand to prepare for placing them in the grooved slot. Pt. Worked on using his left hand to store the pegs, and translatory movements moving the pegs from his palm to the tip of his 2nd digit, and thumb to prepare for placing the pegs into the pegboard.  Therapeutic Exercise:  Pt. Performed BUE there. Ex shoulder stabilization exercises in supine for shoulder protraction against gravity, circular motion, and formulating the alphabet. AROM In supine for shoulder flexion, abduction, followed by reps with 2# dowel. Pt. Worked on reaching with the left UE using the shape tower moving shapes through 4 vertical dowels of progressive heights. Pt. Initially performed the the task without weight. Pt. Tolerated adding a 2# cuff weight for the remainder. Pt. Worked on AAROM for shoulder flexion at the wall.    Manual  Therapy:  Pt.tolerated scapular mobilizations in sidelying for elevation, depression, abduction/rotation. Pt.tolerated anterior, posterior, and inferior glides at the [facility] joint for flexion, and abduction. Manual therapy was performed independent of, and in preparation for ROM,a nd there. Ex.    Pt. responded well to moist heat, manual techniques, and ROM for the left shoulder. Pt. Presented with decreased pain following there. Ex, moist heat, and manual techniques. Pt. Continues to need work on improving and normalizing tone, improving strength, and [place] skills in his left hand. Pt. Has difficulty using his left hand for storing multiple pegs, and translatory movements. Pt.  Continues to work on improving LUE strength, motor control, and [facility] skills in order to improve LUE fiunctioning during ADLS, and IADLs, and maximize overall independence.                         OT Education - 09/21/19 1039    Education Details OT services, goal, POC.    Person(s) Educated Patient    Methods Explanation    Comprehension Verbalized understanding;Need further instruction               OT Long Term Goals - 09/14/19 1649      OT LONG TERM GOAL #1   Title Pt. will improve LUE strength by 1 muscle grade to assist with gardening, and yardwork.    Baseline Eval: 4/5 shoulder flexion, abduction. Pt. is unable to garden, and perform yardwork    Time 12    Period Weeks    Status New    Target Date 12/07/19      OT LONG TERM GOAL #2   Title Pt. will improve FOTO score by 2 points to maximize maximize independence with ADLs, and IADL functioning.    Baseline Intake score: 59    Time 12    Period Weeks    Status New    Target Date 12/07/19      OT LONG TERM GOAL #3   Title Pt will improve Left hand [facility] skills in order to be able to manipulate small objects    Baseline Eval: Left hand [facility] skills are limited. pt. has difficulty manipulating small objects efficiently during ADLs, and IADLs.    Time 12    Period Weeks    Status New    Target Date 12/07/19      OT LONG TERM GOAL #4   Title Pt. will imprive left hand motor control skills to be able to independently brush his hair with his LUE.    Baseline Eval: Pt. has difficulty    Time 12    Period Weeks    Status New    Target Date 12/07/19      OT LONG TERM GOAL #5   Title Pt. will use his left hand to be able to independently hold, and use a washcloth    Baseline Eval: Pt. has difficulty    Time 12    Period Weeks    Status New    Target Date 12/07/19                 Plan - 09/21/19 1040    Clinical Impression Statement Pt. responded well to moist heat, manual techniques, and  ROM for the left shoulder. Pt. Presented with decreased pain following there. Ex, moist heat, and manual techniques. Pt. Continues to need work on improving and normalizing tone, improving strength, and [place] skills in his left hand. Pt. Has difficulty using  his left hand for storing multiple pegs, and translatory movements. Pt. Continues to work on improving LUE strength, motor control, and [facility] skills in order to improve LUE fiunctioning during ADLS, and IADLs, and maximize overall independence.       OT Occupational Profile and History Problem Focused Assessment - Including review of records relating to presenting problem    Occupational performance deficits (Please refer to evaluation for details): ADL's;IADL's    Body Structure / Function / Physical Skills ADL;IADL;Balance;Proprioception;Strength;Coordination;UE functional use;FMC;Dexterity    Rehab Potential Good    Clinical Decision Making Several treatment options, min-mod task modification necessary    Comorbidities Affecting Occupational Performance: May have comorbidities impacting occupational performance    Modification or Assistance to Complete Evaluation  Min-Moderate modification of tasks or assist with assess necessary to complete eval    OT Frequency 2x / week    OT Duration 12 weeks    OT Treatment/Interventions Self-care/ADL training;Therapeutic exercise;DME and/or AE instruction;Patient/family education;Therapeutic activities;Energy conservation;Neuromuscular education    Consulted and Agree with Plan of Care Patient           Patient will benefit from skilled therapeutic intervention in order to improve the following deficits and impairments:   Body Structure / Function / Physical Skills: ADL, IADL, Balance, Proprioception, Strength, Coordination, UE functional use, FMC, Dexterity       Visit Diagnosis: Muscle weakness (generalized)  Other lack of coordination    Problem List Patient Active Problem List    Diagnosis Date Noted  . Carotid stenosis, right 08/04/2019  . Carotid stenosis 07/29/2019  . Carotid artery plaque, right 07/19/2019  . Hx of completed stroke 07/19/2019  . AKI (acute kidney injury) ([place])   . Hemiparesis affecting left side as late effect of stroke ([place])   . Decreased GFR   . Dyslipidemia   . Stroke ([place]) 07/06/2019  . TIA (transient ischemic attack) 07/05/2019  . Hypokalemia 07/05/2019  . GERD without esophagitis 06/18/2017  . Essential hypertension 08/19/2016  . Vertigo 12/05/2015  . Adenomatous colon polyp 01/31/2014  . Basal cell carcinoma 09/23/2013  . Hay fever 09/23/2013  . Vitamin D deficiency 09/23/2013  . Gastric catarrh 11/28/2011  . Hyperlipidemia, mixed 11/28/2011  . Preop cardiovascular exam 11/28/2011    Russell Carina, MS, OTR/L 09/21/2019, 10:45 AM  [place] MAIN [facility] SERVICES [address] [place], [place], [address] Phone: [phone]   Fax:  [phone]  Name: Russell Herman MRN: [id number] Date of Birth: [birth year]

## 2019-09-26 ENCOUNTER — Ambulatory Visit: Payer: Medicare Other

## 2019-09-26 ENCOUNTER — Other Ambulatory Visit: Payer: Self-pay

## 2019-09-26 DIAGNOSIS — M6281 Muscle weakness (generalized): Secondary | ICD-10-CM | POA: Diagnosis not present

## 2019-09-26 DIAGNOSIS — I69354 Hemiplegia and hemiparesis following cerebral infarction affecting left non-dominant side: Secondary | ICD-10-CM

## 2019-09-26 DIAGNOSIS — R278 Other lack of coordination: Secondary | ICD-10-CM

## 2019-09-26 NOTE — Therapy (Signed)
[place] MAIN [facility] SERVICES [address] [place], [place], [address] Phone: [phone]   Fax:  [phone]  Physical Therapy Treatment  Patient Details  Name: Russell Herman MRN: [id number] Date of Birth: 03/01/1945 No data recorded  Encounter Date: 09/26/2019   PT End of Session - 09/26/19 1212    Visit Number 3    Number of Visits 17    Date for PT Re-Evaluation 12/13/19    PT Start Time 1016    PT Stop Time 1100    PT Time Calculation (min) 44 min    Equipment Utilized During Treatment Gait belt    Activity Tolerance Patient tolerated treatment well    Behavior During Therapy WFL for tasks assessed/performed           Past Medical History:  Diagnosis Date  . Adenomatous colon polyp   . Allergy   . Arthritis   . Cancer ([place])   . Coronary artery disease   . GERD (gastroesophageal reflux disease)   . Hypertension   . Wears hearing aid in both ears     Past Surgical History:  Procedure Laterality Date  . cyst removed    . ENDARTERECTOMY Right 08/04/2019   Procedure: ENDARTERECTOMY CAROTID;  Surgeon: Algernon Huxley, MD;  Location: [facility];  Service: Vascular;  Laterality: Right;  . EXCISION OF TONGUE LESION Bilateral 05/05/2019   Procedure: EXCISION OF DORSAL TONGUE LESION;  Surgeon: Margaretha Sheffield, MD;  Location: [place];  Service: ENT;  Laterality: Bilateral;  . skin cancer removed      There were no vitals filed for this visit.   Subjective Assessment - 09/26/19 1019    Subjective Patient is doing good today. He notes that his L leg is tight like band around his quad and hamstrings, and L shoulder pain is at 6/10. No updates since last visit, denies any falls. No other concerns/questions at this time.    Pertinent History Right lateral thalamic infarct mild ataxia and mild hemiparesis. Patient went to rehab 07/07/19-07/15/19. Then he had surgery 7/1 for  s/p successful right CEA    Currently in Pain? Yes    Pain  Score 6     Pain Location Shoulder    Pain Orientation Left    Pain Descriptors / Indicators Sore    Pain Type Chronic pain    Pain Onset More than a month ago    Pain Frequency Occasional            TREATMENT  Ther-ex  Nu-step fitness x 5 mins L4 during history taking (unbilled 2 minutes) Hamstring stretching BLE x 30 sec Calf stretching BLE x 30 secs Quad stretching BLE x 30 secs Quantum leg press 25# x 40, 30# x 20,   Standing exercises with 2.5 AW: Hip flexion marches x 15 BLE HS curls x 15 BLE Hip extension x 15 BLE Hip abduction x 15 BLE Heel/Toe raises x 15   Neuromuscular Re-education Alternating step taps from airex pad to 6" step x 15 each LE, with no UE support, patient notes his balance is not what it was Step ups from airex pad to 6-inch step x 10 each LE Eccentric step downs from 4 inch step x 15 verbal cues to complete slow and tap heel SUE CGA  4 square stepping counterclockwise and clockwise with stepping diagonol x 3 rounds, patient cued for tightening core and to take bigger steps, placed walking poles on ground for more difficulty;  patient struggled with stepping backwards and diagonal forwards/backwards.    Pt educated throughout session about proper posture and technique with exercises. Improved exercise technique, movement at target joints, use of target muscles after min to mod verbal, visual, tactile cues.  Patient demonstrates excellent motivation throughout physical therapy session. Session today focused on balance and strengthening exercises. Patient is showing increased strength as weight and reps increased for both the leg press and the standing ankle weight activities. Balance activities continue to be challenging for patient, especially on uneven surfaces or stepping backwards/diagonol. Pt encouraged to continue HEP and follow-up as scheduled. Pt will benefit from PT services to address deficits in strength, balance, and mobility in order to  return to full function at home.        PT Short Term Goals - 09/14/19 1055      PT SHORT TERM GOAL #1   Title Patient will increase lower extremity functional scale to >60/80 to demonstrate improved functional mobility and increased tolerance with ADLs.    Baseline 09/14/19= 43/80    Time 6    Period Weeks    Status New    Target Date 11/02/19      PT SHORT TERM GOAL #2   Title Patient will be modified independent in walking on even/uneven surface with least restrictive assistive device, for 20+ minutes without rest break, reporting some difficulty or less to improve walking tolerance with community ambulation including grocery shopping, going to church,etc.    Baseline Patient can ambulate for 10 minutes on level surfaces without incline and without AD    Time 6    Period Weeks    Status New    Target Date 11/02/19             PT Long Term Goals - 09/14/19 1057      PT LONG TERM GOAL #1   Title Patient will increase BLE gross strength to 4+/5 as to improve functional strength for independent gait, increased standing tolerance and increased ADL ability.    Baseline LLE 3/5 hip abd, 3+/5 hip flex, knee flex/ext 4/5, ankle 4/5    Time 12    Period Weeks    Status New    Target Date 12/07/19      PT LONG TERM GOAL #2   Title Patient will increase Berg Balance score by > 6 points to demonstrate decreased fall risk during functional activities.    Baseline 09/14/19= 42/56    Time 12    Period Weeks    Status New    Target Date 12/07/19      PT LONG TERM GOAL #3   Title Patient will ascend/descend 4 stairs without rail assist independently without loss of balance to improve ability to get in/out of home.    Baseline 09/14/19= needs UE rail support and no AD    Time 12    Period Weeks    Status New    Target Date 12/07/19      PT LONG TERM GOAL #4   Title Patient will be independent in home exercise program to improve strength/mobility for better functional independence  with ADLs.    Time 12    Period Weeks    Status New    Target Date 12/07/19                 Plan - 09/26/19 1212    Clinical Impression Statement Patient demonstrates excellent motivation throughout physical therapy session. Session today focused on balance and  strengthening exercises. Patient is showing increased strength as weight and reps increased for both the leg press and the standing ankle weight activities. Balance activities continue to be challenging for patient, especially on uneven surfaces or stepping backwards/diagonol. Pt encouraged to continue HEP and follow-up as scheduled. Pt will benefit from PT services to address deficits in strength, balance, and mobility in order to return to full function at home.    Personal Factors and Comorbidities Age;Comorbidity 1    Comorbidities HTN    Examination-Activity Limitations Locomotion Level;Stand    Examination-Participation Restrictions Driving;Yard Work;Meal Prep    Stability/Clinical Decision Making Stable/Uncomplicated    Rehab Potential Good    PT Frequency 2x / week    PT Duration 12 weeks    PT Treatment/Interventions Patient/family education;Balance training;Neuromuscular re-education;Therapeutic exercise;Stair training    PT Next Visit Plan balance and strengthening    Consulted and Agree with Plan of Care Patient           Patient will benefit from skilled therapeutic intervention in order to improve the following deficits and impairments:  Decreased activity tolerance, Decreased strength, Difficulty walking, Decreased balance, Decreased coordination, Decreased mobility, Decreased endurance  Visit Diagnosis: Muscle weakness (generalized)  Other lack of coordination  Hemiparesis affecting left side as late effect of stroke [facility])     Problem List Patient Active Problem List   Diagnosis Date Noted  . Carotid stenosis, right 08/04/2019  . Carotid stenosis 07/29/2019  . Carotid artery plaque, right  07/19/2019  . Hx of completed stroke 07/19/2019  . AKI (acute kidney injury) ([place])   . Hemiparesis affecting left side as late effect of stroke ([place])   . Decreased GFR   . Dyslipidemia   . Stroke ([place]) 07/06/2019  . TIA (transient ischemic attack) 07/05/2019  . Hypokalemia 07/05/2019  . GERD without esophagitis 06/18/2017  . Essential hypertension 08/19/2016  . Vertigo 12/05/2015  . Adenomatous colon polyp 01/31/2014  . Basal cell carcinoma 09/23/2013  . Hay fever 09/23/2013  . Vitamin D deficiency 09/23/2013  . Gastric catarrh 11/28/2011  . Hyperlipidemia, mixed 11/28/2011  . Preop cardiovascular exam 11/28/2011    Noemi Chapel, SPT Bernita Raisin 09/26/2019, 12:21 PM  [place] MAIN [facility] SERVICES [address] [place], [place], [address] Phone: [phone]   Fax:  [phone]  Name: Russell Herman MRN: [id number] Date of Birth: 1946-01-25

## 2019-09-28 ENCOUNTER — Ambulatory Visit: Payer: Medicare Other

## 2019-09-28 ENCOUNTER — Other Ambulatory Visit: Payer: Self-pay

## 2019-09-28 ENCOUNTER — Ambulatory Visit: Payer: Medicare Other | Admitting: Occupational Therapy

## 2019-09-28 DIAGNOSIS — M6281 Muscle weakness (generalized): Secondary | ICD-10-CM

## 2019-09-28 DIAGNOSIS — R278 Other lack of coordination: Secondary | ICD-10-CM

## 2019-09-28 DIAGNOSIS — I69354 Hemiplegia and hemiparesis following cerebral infarction affecting left non-dominant side: Secondary | ICD-10-CM

## 2019-09-28 NOTE — Therapy (Signed)
[place] MAIN [facility] SERVICES [address] [place], [place], [address] Phone: [phone]   Fax:  [phone]  Physical Therapy Treatment  Patient Details  Name: Russell Herman MRN: [id number] Date of Birth: 1945-06-04 No data recorded  Encounter Date: 09/28/2019   PT End of Session - 09/28/19 1102    Visit Number 4    Number of Visits 17    Date for PT Re-Evaluation 12/13/19    PT Start Time 1058    PT Stop Time 1143    PT Time Calculation (min) 45 min    Equipment Utilized During Treatment Gait belt    Activity Tolerance Patient tolerated treatment well    Behavior During Therapy WFL for tasks assessed/performed           Past Medical History:  Diagnosis Date   Adenomatous colon polyp    Allergy    Arthritis    Cancer ([place])    Coronary artery disease    GERD (gastroesophageal reflux disease)    Hypertension    Wears hearing aid in both ears     Past Surgical History:  Procedure Laterality Date   cyst removed     ENDARTERECTOMY Right 08/04/2019   Procedure: ENDARTERECTOMY CAROTID;  Surgeon: Algernon Huxley, MD;  Location: [facility];  Service: Vascular;  Laterality: Right;   EXCISION OF TONGUE LESION Bilateral 05/05/2019   Procedure: EXCISION OF DORSAL TONGUE LESION;  Surgeon: Margaretha Sheffield, MD;  Location: [place];  Service: ENT;  Laterality: Bilateral;   skin cancer removed      There were no vitals filed for this visit.   Subjective Assessment - 09/28/19 1100    Subjective Patient is doing good today. He notes that his L leg is still tight like band around his quad and hamstrings, but stretching Monday helped some. L shoulder pain is at 5/10. No updates since last visit, denies any falls. He continues to do his exercises given to him from inpatient rehab. No other concerns/questions at this time.    Pertinent History Right lateral thalamic infarct mild ataxia and mild hemiparesis. Patient went to rehab  07/07/19-07/15/19. Then he had surgery 7/1 for  s/p successful right CEA    Currently in Pain? Yes    Pain Score 5     Pain Location Shoulder    Pain Orientation Left    Pain Descriptors / Indicators Sore    Pain Type Chronic pain    Pain Onset More than a month ago           TREATMENT  Ther-ex  Nu-step fitness x 5 mins L4 during history taking (unbilled 2 minutes) Hamstring stretching BLE x 30 sec Calf stretching BLE x 30 secs Quad stretching LLE x 30 secs Leg press 40# x 20, 45# x 20   Standing exercises with 2.5 AW with BUE support: increase weight to 4# next visit Hip flexion marches x 20 BLE Hip extension x 20 BLE Hip abduction x 15 BLE Heel/Toe raises x 20   Neuromuscular Re-education Alternating step taps from airex pad to 6" step x 20 each LE, with no UE support  Tapping cones x multiple bouts with SPT calling out what foot to touch what color cones, patient able to balance and touch multiple cones at once before touching the pad. Performed series of 1, 2, and 3 cone tapping, CGA assist, LOB x 2, more difficult standing on his L LE.  Tapping cones x multiple bouts  on airex pad with SPT calling out what foot to touch what color cones, patient able to balance and touch multiple cones at once before touching the pad. Performed series of 1, 2, and 3 cone tapping, CGA assist; LOB x 3, more difficult standing on his L LE.  Agility ladder forwards, forward in and outs, lateral in and outs with diagonal stepping x 1 length each, patient cued for proper stepping pattern, feet alignment, and to not touch the rings/boundaries, CGA assist with no LOB.  Patient needs occasional verbal cueing to improve posture and cueing to correctly perform exercises slowly, holding at end of range to increase motor firing of desired muscle to encourage fatigue.   Patient demonstrates excellent motivation throughout physical therapy session. Added stretching exercises to his HEP so he can stretch out  his legs at home. Session today focused on balance, coordination, and strengthening exercises. Patient is showing increased strength as weight and reps increased for the leg press. Next time will increase ankle weight as he notes that exercises could be more difficult. Initiated the agility ladder to challenge single leg balance and coordination, which he did well on with no loss of balance. Balance activities continue to be challenging for patient, especially on uneven surfaces or stepping backwards/diagonal. Also, balancing on L LE is more difficult than standing on his R. Pt encouraged to continue HEP and follow-up as scheduled. Pt will benefit from PT services to address deficits in strength, balance, and mobility in order to return to full function at home.       PT Short Term Goals - 09/14/19 1055      PT SHORT TERM GOAL #1   Title Patient will increase lower extremity functional scale to >60/80 to demonstrate improved functional mobility and increased tolerance with ADLs.    Baseline 09/14/19= 43/80    Time 6    Period Weeks    Status New    Target Date 11/02/19      PT SHORT TERM GOAL #2   Title Patient will be modified independent in walking on even/uneven surface with least restrictive assistive device, for 20+ minutes without rest break, reporting some difficulty or less to improve walking tolerance with community ambulation including grocery shopping, going to church,etc.    Baseline Patient can ambulate for 10 minutes on level surfaces without incline and without AD    Time 6    Period Weeks    Status New    Target Date 11/02/19             PT Long Term Goals - 09/14/19 1057      PT LONG TERM GOAL #1   Title Patient will increase BLE gross strength to 4+/5 as to improve functional strength for independent gait, increased standing tolerance and increased ADL ability.    Baseline LLE 3/5 hip abd, 3+/5 hip flex, knee flex/ext 4/5, ankle 4/5    Time 12    Period Weeks     Status New    Target Date 12/07/19      PT LONG TERM GOAL #2   Title Patient will increase Berg Balance score by > 6 points to demonstrate decreased fall risk during functional activities.    Baseline 09/14/19= 42/56    Time 12    Period Weeks    Status New    Target Date 12/07/19      PT LONG TERM GOAL #3   Title Patient will ascend/descend 4 stairs without rail assist independently without  loss of balance to improve ability to get in/out of home.    Baseline 09/14/19= needs UE rail support and no AD    Time 12    Period Weeks    Status New    Target Date 12/07/19      PT LONG TERM GOAL #4   Title Patient will be independent in home exercise program to improve strength/mobility for better functional independence with ADLs.    Time 12    Period Weeks    Status New    Target Date 12/07/19                 Plan - 09/28/19 1103    Clinical Impression Statement Patient demonstrates excellent motivation throughout physical therapy session. Added stretching exercises to his HEP so he can stretch out his legs at home. Session today focused on balance, coordination, and strengthening exercises. Patient is showing increased strength as weight and reps increased for the leg press. Next time will increase ankle weight as he notes that exercises could be more difficult. Initiated the agility ladder to challenge single leg balance and coordination, which he did well on with no loss of balance. Balance activities continue to be challenging for patient, especially on uneven surfaces or stepping backwards/diagonal. Also, balancing on L LE is more difficult than standing on his R. Pt encouraged to continue HEP and follow-up as scheduled. Pt will benefit from PT services to address deficits in strength, balance, and mobility in order to return to full function at home.    Personal Factors and Comorbidities Age;Comorbidity 1    Comorbidities HTN    Examination-Activity Limitations Locomotion  Level;Stand    Examination-Participation Restrictions Driving;Yard Work;Meal Prep    Stability/Clinical Decision Making Stable/Uncomplicated    Rehab Potential Good    PT Frequency 2x / week    PT Duration 12 weeks    PT Treatment/Interventions Patient/family education;Balance training;Neuromuscular re-education;Therapeutic exercise;Stair training    PT Next Visit Plan balance and strengthening    PT Home Exercise Plan Medbridge Access Code: [email or web address]    Consulted and Agree with Plan of Care Patient           Patient will benefit from skilled therapeutic intervention in order to improve the following deficits and impairments:  Decreased activity tolerance, Decreased strength, Difficulty walking, Decreased balance, Decreased coordination, Decreased mobility, Decreased endurance  Visit Diagnosis: Muscle weakness (generalized)  Other lack of coordination  Hemiparesis affecting left side as late effect of stroke [facility])     Problem List Patient Active Problem List   Diagnosis Date Noted   Carotid stenosis, right 08/04/2019   Carotid stenosis 07/29/2019   Carotid artery plaque, right 07/19/2019   Hx of completed stroke 07/19/2019   AKI (acute kidney injury) ([place])    Hemiparesis affecting left side as late effect of stroke ([place])    Decreased GFR    Dyslipidemia    Stroke ([place]) 07/06/2019   TIA (transient ischemic attack) 07/05/2019   Hypokalemia 07/05/2019   GERD without esophagitis 06/18/2017   Essential hypertension 08/19/2016   Vertigo 12/05/2015   Adenomatous colon polyp 01/31/2014   Basal cell carcinoma 09/23/2013   Hay fever 09/23/2013   Vitamin D deficiency 09/23/2013   Gastric catarrh 11/28/2011   Hyperlipidemia, mixed 11/28/2011   Preop cardiovascular exam 11/28/2011    Noemi Chapel, SPT Bernita Raisin 09/28/2019, 12:07 PM  [place] MAIN [facility] SERVICES [address] [place], [place], [address] Phone:  [phone]  Fax:  [phone]  Name: AVISH TORRY MRN: [id number] Date of Birth: [birth year]

## 2019-09-28 NOTE — Patient Instructions (Signed)
Access Code: [email or web address] URL: [email or web address] Date: 09/28/2019 Prepared by: Rebbeca Paul  Exercises Hamstring Towel Stretch - 1 x daily - 7 x weekly - 1 sets - 2 reps - 30 hold Prone Quad Stretch with Towel Roll and Strap - 1 x daily - 7 x weekly - 1 sets - 2 reps - 30 hold

## 2019-09-29 ENCOUNTER — Encounter: Payer: Self-pay | Admitting: Occupational Therapy

## 2019-09-29 NOTE — Therapy (Addendum)
[place] MAIN [facility] SERVICES [address] [place], [place], [address] Phone: [phone]   Fax:  [phone]  Occupational Therapy Treatment  Patient Details  Name: Russell Herman MRN: [id number] Date of Birth: [birth year] Referring Provider (OT): Dr. Letta Pate   Encounter Date: 09/28/2019   OT End of Session - 09/29/19 0902    Visit Number 3    Number of Visits 24    Date for OT Re-Evaluation 12/07/19    OT Start Time 1148    OT Stop Time 1230    OT Time Calculation (min) 42 min    Activity Tolerance Patient tolerated treatment well    Behavior During Therapy [facility] for tasks assessed/performed           Past Medical History:  Diagnosis Date  . Adenomatous colon polyp   . Allergy   . Arthritis   . Cancer (Russell Herman)   . Coronary artery disease   . GERD (gastroesophageal reflux disease)   . Hypertension   . Wears hearing aid in both ears     Past Surgical History:  Procedure Laterality Date  . cyst removed    . ENDARTERECTOMY Right 08/04/2019   Procedure: ENDARTERECTOMY CAROTID;  Surgeon: Algernon Huxley, MD;  Location: [facility];  Service: Vascular;  Laterality: Right;  . EXCISION OF TONGUE LESION Bilateral 05/05/2019   Procedure: EXCISION OF DORSAL TONGUE LESION;  Surgeon: Margaretha Sheffield, MD;  Location: [place];  Service: ENT;  Laterality: Bilateral;  . skin cancer removed      There were no vitals filed for this visit.   Subjective Assessment - 09/29/19 0859    Subjective  Pt. reports that he has been able to mow his lawn this past week.    Pertinent History Pt. is a 74 y.o. male who was hospitalized with a CVA on June 1st, 2021. Imaging revealed a small acute infarct in right lateral thalamus, and mild small vessel disease. Pt. received inpatient rehab services, and home health services. Pt. underwent  a Right Carotid Endartectomy on 08/04/2019. Pt. resides with his wife, and has supportive family. Pt. is a retired Administrator  for [employer], and enjoys gardening.    Currently in Pain? Yes    Pain Score 2     Pain Location Shoulder    Pain Orientation Left    Pain Descriptors / Indicators Tingling    Pain Type Chronic pain    Pain Onset More than a month ago           OT TREATMENT    Therapeutic Exercise:  Pt. Performed BUE there. Ex shoulder stabilization exercises in supine for shoulder protraction against gravity, circular motion, and formulating the alphabet. AROM In supine for shoulder flexion, abduction, followed by reps with 2# dowel. Pt. Worked on reaching with the left UE using the shape tower moving shapes through 4 vertical dowels of progressive heights. Pt. Initially performed the the task without weight. Pt. Tolerated adding a 2# cuff weight for the remainder. Pt. Worked on AAROM for shoulder flexion at the wall.    Manual Therapy:  Pt. tolerated scapular mobilizations in sidelying for elevation, depression, abduction/rotation. Pt.tolerated scapular mobilizations in sidelying for elevation, depression, abduction/rotation. Pt.tolerated anterior, posterior, and inferior glides at the [facility] joint for flexion, and abduction. Manual therapy was performed independent of, and in preparation for ROM,a nd there. Ex.    Pt. reports that he is doing more tasks at home. Pt. presents with decreased pain  of 2/10. Pt. responded well to manual techniques, and ROM for the left shoulder. Pt. presented with decreased pain following there. Ex, and manual techniques. Pt. continues to need work on improving and normalizing tone, improving strength, and [place] skills in his left hand. Pt. continues to work on improving LUE strength, motor control, and [facility] skills in order to improve LUE fiunctioning during ADLS, and IADLs, and maximize overall independence.                         OT Education - 09/29/19 0902    Education Details OT services, goal, POC.    Person(s) Educated Patient    Methods  Explanation    Comprehension Verbalized understanding;Need further instruction               OT Long Term Goals - 09/14/19 1649      OT LONG TERM GOAL #1   Title Pt. will improve LUE strength by 1 muscle grade to assist with gardening, and yardwork.    Baseline Eval: 4/5 shoulder flexion, abduction. Pt. is unable to garden, and perform yardwork    Time 12    Period Weeks    Status New    Target Date 12/07/19      OT LONG TERM GOAL #2   Title Pt. will improve FOTO score by 2 points to maximize maximize independence with ADLs, and IADL functioning.    Baseline Intake score: 59    Time 12    Period Weeks    Status New    Target Date 12/07/19      OT LONG TERM GOAL #3   Title Pt will improve Left hand [facility] skills in order to be able to manipulate small objects    Baseline Eval: Left hand [facility] skills are limited. pt. has difficulty manipulating small objects efficiently during ADLs, and IADLs.    Time 12    Period Weeks    Status New    Target Date 12/07/19      OT LONG TERM GOAL #4   Title Pt. will imprive left hand motor control skills to be able to independently brush his hair with his LUE.    Baseline Eval: Pt. has difficulty    Time 12    Period Weeks    Status New    Target Date 12/07/19      OT LONG TERM GOAL #5   Title Pt. will use his left hand to be able to independently hold, and use a washcloth    Baseline Eval: Pt. has difficulty    Time 12    Period Weeks    Status New    Target Date 12/07/19                 Plan - 09/29/19 5170    Clinical Impression Statement Pt. reports that he is doing more tasks at home. Pt. presents with decreased pain of 2/10. Pt. responded well to manual techniques, and ROM for the left shoulder. Pt. presented with decreased pain following there. Ex, and manual techniques. Pt. continues to need work on improving and normalizing tone, improving strength, and [place] skills in his left hand. Pt. continues to work on improving LUE  strength, motor control, and [facility] skills in order to improve LUE fiunctioning during ADLS, and IADLs, and maximize overall independence.    OT Occupational Profile and History Problem Focused Assessment - Including review of records relating to presenting problem  Occupational performance deficits (Please refer to evaluation for details): ADL's;IADL's    Body Structure / Function / Physical Skills ADL;IADL;Balance;Proprioception;Strength;Coordination;UE functional use;FMC;Dexterity    Rehab Potential Good    Clinical Decision Making Several treatment options, min-mod task modification necessary    Comorbidities Affecting Occupational Performance: May have comorbidities impacting occupational performance    Modification or Assistance to Complete Evaluation  Min-Moderate modification of tasks or assist with assess necessary to complete eval    OT Frequency 2x / week    OT Treatment/Interventions Self-care/ADL training;Therapeutic exercise;DME and/or AE instruction;Patient/family education;Therapeutic activities;Energy conservation;Neuromuscular education    Consulted and Agree with Plan of Care Patient           Patient will benefit from skilled therapeutic intervention in order to improve the following deficits and impairments:   Body Structure / Function / Physical Skills: ADL, IADL, Balance, Proprioception, Strength, Coordination, UE functional use, FMC, Dexterity       Visit Diagnosis: Muscle weakness (generalized)  Other lack of coordination    Problem List Patient Active Problem List   Diagnosis Date Noted  . Carotid stenosis, right 08/04/2019  . Carotid stenosis 07/29/2019  . Carotid artery plaque, right 07/19/2019  . Hx of completed stroke 07/19/2019  . AKI (acute kidney injury) ([place])   . Hemiparesis affecting left side as late effect of stroke ([place])   . Decreased GFR   . Dyslipidemia   . Stroke ([place]) 07/06/2019  . TIA (transient ischemic attack) 07/05/2019  .  Hypokalemia 07/05/2019  . GERD without esophagitis 06/18/2017  . Essential hypertension 08/19/2016  . Vertigo 12/05/2015  . Adenomatous colon polyp 01/31/2014  . Basal cell carcinoma 09/23/2013  . Hay fever 09/23/2013  . Vitamin D deficiency 09/23/2013  . Gastric catarrh 11/28/2011  . Hyperlipidemia, mixed 11/28/2011  . Preop cardiovascular exam 11/28/2011    Harrel Carina, MS, OTR/L 09/29/2019, 9:10 AM  [place] MAIN [facility] SERVICES [address] [place], [place], [address] Phone: [phone]   Fax:  [phone]  Name: RAMOND DARNELL MRN: [id number] Date of Birth: 01/23/1946

## 2019-10-04 ENCOUNTER — Ambulatory Visit: Payer: Medicare Other | Admitting: Occupational Therapy

## 2019-10-04 ENCOUNTER — Encounter: Payer: Self-pay | Admitting: Occupational Therapy

## 2019-10-04 ENCOUNTER — Encounter: Payer: Self-pay | Admitting: Physical Therapy

## 2019-10-04 ENCOUNTER — Other Ambulatory Visit: Payer: Self-pay

## 2019-10-04 ENCOUNTER — Ambulatory Visit: Payer: Medicare Other | Admitting: Physical Therapy

## 2019-10-04 DIAGNOSIS — R278 Other lack of coordination: Secondary | ICD-10-CM

## 2019-10-04 DIAGNOSIS — M6281 Muscle weakness (generalized): Secondary | ICD-10-CM | POA: Diagnosis not present

## 2019-10-04 NOTE — Patient Instructions (Signed)
Access Code: [email or web address] URL: [email or web address] Date: 10/04/2019 Prepared by: Blanche East  Exercises Standing Hip Extension with Resistance at Ankles and Counter Support - 1 x daily - 7 x weekly - 2 sets - 15 reps Standing Hip Abduction with Resistance at Ankles and Counter Support - 1 x daily - 7 x weekly - 2 sets - 15 reps Standing Hip Flexion with Resistance at Ankles and Counter Support - 1 x daily - 7 x weekly - 2 sets - 15 reps Forward Monster Walks - 1 x daily - 7 x weekly - 10 reps - 2 sets Seated Ankle Dorsiflexion with Resistance - 1 x daily - 7 x weekly - 1 sets - 20 reps

## 2019-10-04 NOTE — Therapy (Addendum)
[place] MAIN [facility] SERVICES [address] [place], [place], [address] Phone: [phone]   Fax:  [phone]  Occupational Therapy Treatment  Patient Details  Name: Russell Herman MRN: [id number] Date of Birth: October 01, 1945 Referring Provider (OT): Dr. Letta Pate   Encounter Date: 10/04/2019   OT End of Session - 10/04/19 1114    Visit Number 4    Number of Visits 24    Date for OT Re-Evaluation 12/07/19    OT Start Time 1020    OT Stop Time 1100    OT Time Calculation (min) 40 min    Activity Tolerance Patient tolerated treatment well    Behavior During Therapy [facility] for tasks assessed/performed           Past Medical History:  Diagnosis Date  . Adenomatous colon polyp   . Allergy   . Arthritis   . Cancer ([place])   . Coronary artery disease   . GERD (gastroesophageal reflux disease)   . Hypertension   . Wears hearing aid in both ears     Past Surgical History:  Procedure Laterality Date  . cyst removed    . ENDARTERECTOMY Right 08/04/2019   Procedure: ENDARTERECTOMY CAROTID;  Surgeon: Algernon Huxley, MD;  Location: [facility];  Service: Vascular;  Laterality: Right;  . EXCISION OF TONGUE LESION Bilateral 05/05/2019   Procedure: EXCISION OF DORSAL TONGUE LESION;  Surgeon: Margaretha Sheffield, MD;  Location: [place];  Service: ENT;  Laterality: Bilateral;  . skin cancer removed      There were no vitals filed for this visit.   Subjective Assessment - 10/04/19 1112    Subjective  Pt. reports that he has been doing yardwork.    Pertinent History Pt. is a 74 y.o. male who was hospitalized with a CVA on June 1st, 2021. Imaging revealed a small acute infarct in right lateral thalamus, and mild small vessel disease. Pt. received inpatient rehab services, and home health services. Pt. underwent  a Right Carotid Endartectomy on 08/04/2019. Pt. resides with his wife, and has supportive family. Pt. is a retired Administrator for [employer], and enjoys  gardening.    Currently in Pain? Yes    Pain Score 4     Pain Location Shoulder    Pain Orientation Left    Pain Descriptors / Indicators Sore    Pain Type Chronic pain    Pain Frequency Intermittent    Pain Relieving Factors Pain 2/10 after therapy    Multiple Pain Sites No          OT TREATMENT   Therapeutic Exercise:  Pt. performed BUE therapeutic. ex scapular protraction in supine with the shoulder flexed to 90 degrees, and the elbow extended. Pt. performed shoulder stabilization exercises in supine. Pt. performed gross gripping with grip strengthener. Pt. worked on sustaining grip while grasping pegs and reaching at various heights. The gripper was set at 17.9#. Pt. worked on gross gripping with the [employer] dynamometer: 55.2#, 56.2#, 54.2#, and 54.2#  Manual Therapy:  Pt.tolerated scapular mobilizations in sidelying for elevation, depression, abduction/rotation. Pt.tolerated anterior, posterior, and inferior glides at the [facility] joint for flexion, and abduction. Manual therapy was performed independent of, and in preparation for ROM, and there. Ex.    Pt. reports that he is doing yard work at home. Pt. Reported 4/10 pain in the left shoulder initially, 2/10 at the end of therapy. Pt. Reports pain at 128 degrees of shoulder flexion in the ROM  arc, and at 40 degrees when returning his UE to his side after flexion. Pt. responded well to manual techniques, and ROM for the left shoulder. Pt. presented with decreased pain following there. Ex, and manual techniques. Pt. continues to need work on improving and normalizing tone, improving strength, and [place] skills in his left hand. Pt. continues to work on improving LUE strength, motor control, and [facility] skills in order to improve LUE fiunctioning during ADLS, and IADLs, and maximize overall independence.                          OT Education - 10/04/19 1114    Education Details OT services, goal, POC.     Person(s) Educated Patient    Methods Explanation    Comprehension Verbalized understanding;Need further instruction               OT Long Term Goals - 09/14/19 1649      OT LONG TERM GOAL #1   Title Pt. will improve LUE strength by 1 muscle grade to assist with gardening, and yardwork.    Baseline Eval: 4/5 shoulder flexion, abduction. Pt. is unable to garden, and perform yardwork    Time 12    Period Weeks    Status New    Target Date 12/07/19      OT LONG TERM GOAL #2   Title Pt. will improve FOTO score by 2 points to maximize maximize independence with ADLs, and IADL functioning.    Baseline Intake score: 59    Time 12    Period Weeks    Status New    Target Date 12/07/19      OT LONG TERM GOAL #3   Title Pt will improve Left hand [facility] skills in order to be able to manipulate small objects    Baseline Eval: Left hand [facility] skills are limited. pt. has difficulty manipulating small objects efficiently during ADLs, and IADLs.    Time 12    Period Weeks    Status New    Target Date 12/07/19      OT LONG TERM GOAL #4   Title Pt. will imprive left hand motor control skills to be able to independently brush his hair with his LUE.    Baseline Eval: Pt. has difficulty    Time 12    Period Weeks    Status New    Target Date 12/07/19      OT LONG TERM GOAL #5   Title Pt. will use his left hand to be able to independently hold, and use a washcloth    Baseline Eval: Pt. has difficulty    Time 12    Period Weeks    Status New    Target Date 12/07/19                 Plan - 10/04/19 1115    Clinical Impression Statement Pt. reports that he is doing yard work at home. Pt. Reported 4/10 pain in the left shoulder initially, 2/10 at the end of therapy. Pt. Reports pain at 128 degrees of shoulder flexion in the ROM arc, and at 40 degrees when returning his UE to his side after flexion. Pt. responded well to manual techniques, and ROM for the left shoulder. Pt. presented  with decreased pain following there. Ex, and manual techniques. Pt. continues to need work on improving and normalizing tone, improving strength, and [place] skills in his left hand. Pt. continues  to work on improving LUE strength, motor control, and [facility] skills in order to improve LUE fiunctioning during ADLS, and IADLs, and maximize overall independence.   OT Occupational Profile and History Problem Focused Assessment - Including review of records relating to presenting problem    Occupational performance deficits (Please refer to evaluation for details): ADL's;IADL's    Body Structure / Function / Physical Skills ADL;IADL;Balance;Proprioception;Strength;Coordination;UE functional use;FMC;Dexterity    Rehab Potential Good    Clinical Decision Making Several treatment options, min-mod task modification necessary    Comorbidities Affecting Occupational Performance: May have comorbidities impacting occupational performance    Modification or Assistance to Complete Evaluation  Min-Moderate modification of tasks or assist with assess necessary to complete eval    OT Frequency 2x / week    OT Duration 12 weeks    OT Treatment/Interventions Self-care/ADL training;Therapeutic exercise;DME and/or AE instruction;Patient/family education;Therapeutic activities;Energy conservation;Neuromuscular education    Consulted and Agree with Plan of Care Patient           Patient will benefit from skilled therapeutic intervention in order to improve the following deficits and impairments:   Body Structure / Function / Physical Skills: ADL, IADL, Balance, Proprioception, Strength, Coordination, UE functional use, FMC, Dexterity       Visit Diagnosis: Muscle weakness (generalized)  Other lack of coordination    Problem List Patient Active Problem List   Diagnosis Date Noted  . Carotid stenosis, right 08/04/2019  . Carotid stenosis 07/29/2019  . Carotid artery plaque, right 07/19/2019  . Hx of completed  stroke 07/19/2019  . AKI (acute kidney injury) ([place])   . Hemiparesis affecting left side as late effect of stroke ([place])   . Decreased GFR   . Dyslipidemia   . Stroke ([place]) 07/06/2019  . TIA (transient ischemic attack) 07/05/2019  . Hypokalemia 07/05/2019  . GERD without esophagitis 06/18/2017  . Essential hypertension 08/19/2016  . Vertigo 12/05/2015  . Adenomatous colon polyp 01/31/2014  . Basal cell carcinoma 09/23/2013  . Hay fever 09/23/2013  . Vitamin D deficiency 09/23/2013  . Gastric catarrh 11/28/2011  . Hyperlipidemia, mixed 11/28/2011  . Preop cardiovascular exam 11/28/2011    Harrel Carina, MS, OTR/L 10/04/2019, 11:18 AM  [place] MAIN [facility] SERVICES [address] [place], [place], [address] Phone: [phone]   Fax:  [phone]  Name: DEVIN GANAWAY MRN: [id number] Date of Birth: 1946/01/15

## 2019-10-04 NOTE — Therapy (Signed)
[place] MAIN [facility] SERVICES [address] [place], [place], [address] Phone: [phone]   Fax:  [phone]  Physical Therapy Treatment  Patient Details  Name: Russell Herman MRN: [id number] Date of Birth: 12/03/1945 No data recorded  Encounter Date: 10/04/2019   PT End of Session - 10/04/19 0934    Visit Number 5    Number of Visits 17    Date for PT Re-Evaluation 12/13/19    PT Start Time 0932    PT Stop Time 1015    PT Time Calculation (min) 43 min    Equipment Utilized During Treatment Gait belt    Activity Tolerance Patient tolerated treatment well    Behavior During Therapy WFL for tasks assessed/performed           Past Medical History:  Diagnosis Date  . Adenomatous colon polyp   . Allergy   . Arthritis   . Cancer ([place])   . Coronary artery disease   . GERD (gastroesophageal reflux disease)   . Hypertension   . Wears hearing aid in both ears     Past Surgical History:  Procedure Laterality Date  . cyst removed    . ENDARTERECTOMY Right 08/04/2019   Procedure: ENDARTERECTOMY CAROTID;  Surgeon: Algernon Huxley, MD;  Location: [facility];  Service: Vascular;  Laterality: Right;  . EXCISION OF TONGUE LESION Bilateral 05/05/2019   Procedure: EXCISION OF DORSAL TONGUE LESION;  Surgeon: Margaretha Sheffield, MD;  Location: [facility];  Service: ENT;  Laterality: Bilateral;  . skin cancer removed      There were no vitals filed for this visit.   Subjective Assessment - 10/04/19 0933    Subjective Patient reports increased soreness in left shoulder. He reports adherence with HEP; denies any new falls; No new complaints;    Pertinent History Right lateral thalamic infarct mild ataxia and mild hemiparesis. Patient went to rehab 07/07/19-07/15/19. Then he had surgery 7/1 for  s/p successful right CEA    Currently in Pain? Yes    Pain Score 5     Pain Location Shoulder    Pain Orientation Left    Pain Descriptors / Indicators  Aching;Sore    Pain Type Chronic pain    Pain Onset More than a month ago    Pain Frequency Intermittent    Aggravating Factors  raising arm overhead    Pain Relieving Factors rest    Effect of Pain on Daily Activities decreased reaching tolerance;    Multiple Pain Sites No                  TREATMENT  Ther-ex Warm up on crosstrainer, level 2 x4 min (Unbilled); seat #6 Leg press, BLE 70# x15 Leg Press LLE only 40#, 2 x20 reps;  Patient required min-moderate verbal/tactile cues for correct exercise technique.   Standing exercises with blue tband around BLE ankles for HEP: Hip extension x15 BLE Hip abductionx15BLE Hip flexion SLR x15 reps bilaterally Forward/backward diagonal stepping x10 feet x2 reps each direction;   Seated with green tband around BLE feet: -ankle DF x20 reps each LE  Provided written HEP for better adherence; Patient does require min VCs for proper positioning including to avoid trunk lean for better hip strengthening;    Neuromuscular Re-education Resisted walking 12.5# forward/backward, side/side x4 way, x2 laps each with CGA for safety;   Weaving around cones #5 x2 laps with close supervision  Side stepping over cones #5 x2 laps each  direction with CGA for safety;  Required min VCs to increase step length for better foot clearance;      Patient needs occasional verbal cueing to improve posture and cueing to correctly perform exercises slowly, holding at end of range to increase motor firing of desired muscle to encourage fatigue.  Patient tolerated session well. Advanced HEP with resisted exercise, see patient instructions. He does require min VCs for proper exercise technique. Patient denies any increase in pain at end of session. He does report mild fatigue;                PT Education - 10/04/19 0934    Education Details LE strengthening, balance, HEP    Person(s) Educated Patient    Methods Explanation;Verbal  cues    Comprehension Verbalized understanding;Returned demonstration;Verbal cues required;Need further instruction            PT Short Term Goals - 09/14/19 1055      PT SHORT TERM GOAL #1   Title Patient will increase lower extremity functional scale to >60/80 to demonstrate improved functional mobility and increased tolerance with ADLs.    Baseline 09/14/19= 43/80    Time 6    Period Weeks    Status New    Target Date 11/02/19      PT SHORT TERM GOAL #2   Title Patient will be modified independent in walking on even/uneven surface with least restrictive assistive device, for 20+ minutes without rest break, reporting some difficulty or less to improve walking tolerance with community ambulation including grocery shopping, going to church,etc.    Baseline Patient can ambulate for 10 minutes on level surfaces without incline and without AD    Time 6    Period Weeks    Status New    Target Date 11/02/19             PT Long Term Goals - 09/14/19 1057      PT LONG TERM GOAL #1   Title Patient will increase BLE gross strength to 4+/5 as to improve functional strength for independent gait, increased standing tolerance and increased ADL ability.    Baseline LLE 3/5 hip abd, 3+/5 hip flex, knee flex/ext 4/5, ankle 4/5    Time 12    Period Weeks    Status New    Target Date 12/07/19      PT LONG TERM GOAL #2   Title Patient will increase Berg Balance score by > 6 points to demonstrate decreased fall risk during functional activities.    Baseline 09/14/19= 42/56    Time 12    Period Weeks    Status New    Target Date 12/07/19      PT LONG TERM GOAL #3   Title Patient will ascend/descend 4 stairs without rail assist independently without loss of balance to improve ability to get in/out of home.    Baseline 09/14/19= needs UE rail support and no AD    Time 12    Period Weeks    Status New    Target Date 12/07/19      PT LONG TERM GOAL #4   Title Patient will be independent  in home exercise program to improve strength/mobility for better functional independence with ADLs.    Time 12    Period Weeks    Status New    Target Date 12/07/19                 Plan - 10/04/19 1310  Clinical Impression Statement Patient motivated and participated well within session. He was instructed in advanced LE strengthening as part of HEP for better exercise progression. He does require min VCs for proper exercise technique/positioning. Patient also instructed in advanced dynamic balance activities. He did require CGA with resisted walking for safety. Patient also had difficulty with stepping over obstacles requiring cues for increased hip flexion for better foot clearance. Patient would benefit from additional skilled PT Intervention to improve strength, balance and mobility;    Personal Factors and Comorbidities Age;Comorbidity 1    Comorbidities HTN    Examination-Activity Limitations Locomotion Level;Stand    Examination-Participation Restrictions Driving;Yard Work;Meal Prep    Stability/Clinical Decision Making Stable/Uncomplicated    Rehab Potential Good    PT Frequency 2x / week    PT Duration 12 weeks    PT Treatment/Interventions Patient/family education;Balance training;Neuromuscular re-education;Therapeutic exercise;Stair training    PT Next Visit Plan balance and strengthening    PT Home Exercise Plan Medbridge Access Code: [email or web address]    Consulted and Agree with Plan of Care Patient           Patient will benefit from skilled therapeutic intervention in order to improve the following deficits and impairments:  Decreased activity tolerance, Decreased strength, Difficulty walking, Decreased balance, Decreased coordination, Decreased mobility, Decreased endurance  Visit Diagnosis: Muscle weakness (generalized)  Other lack of coordination     Problem List Patient Active Problem List   Diagnosis Date Noted  . Carotid stenosis, right 08/04/2019  .  Carotid stenosis 07/29/2019  . Carotid artery plaque, right 07/19/2019  . Hx of completed stroke 07/19/2019  . AKI (acute kidney injury) ([place])   . Hemiparesis affecting left side as late effect of stroke ([place])   . Decreased GFR   . Dyslipidemia   . Stroke ([place]) 07/06/2019  . TIA (transient ischemic attack) 07/05/2019  . Hypokalemia 07/05/2019  . GERD without esophagitis 06/18/2017  . Essential hypertension 08/19/2016  . Vertigo 12/05/2015  . Adenomatous colon polyp 01/31/2014  . Basal cell carcinoma 09/23/2013  . Hay fever 09/23/2013  . Vitamin D deficiency 09/23/2013  . Gastric catarrh 11/28/2011  . Hyperlipidemia, mixed 11/28/2011  . Preop cardiovascular exam 11/28/2011    Ulyana Pitones PT, DPT 10/04/2019, 1:15 PM  [place] MAIN [facility] SERVICES [address] [place], [place], [address] Phone: [phone]   Fax:  [phone]  Name: Russell Herman MRN: [id number] Date of Birth: March 20, 1945

## 2019-10-06 ENCOUNTER — Ambulatory Visit: Payer: Medicare Other | Attending: Physical Medicine & Rehabilitation | Admitting: Physical Therapy

## 2019-10-06 ENCOUNTER — Other Ambulatory Visit: Payer: Self-pay

## 2019-10-06 ENCOUNTER — Encounter: Payer: Self-pay | Admitting: Physical Therapy

## 2019-10-06 DIAGNOSIS — R278 Other lack of coordination: Secondary | ICD-10-CM | POA: Diagnosis present

## 2019-10-06 DIAGNOSIS — M6281 Muscle weakness (generalized): Secondary | ICD-10-CM | POA: Diagnosis not present

## 2019-10-06 DIAGNOSIS — I6309 Cerebral infarction due to thrombosis of other precerebral artery: Secondary | ICD-10-CM | POA: Insufficient documentation

## 2019-10-06 DIAGNOSIS — I69354 Hemiplegia and hemiparesis following cerebral infarction affecting left non-dominant side: Secondary | ICD-10-CM | POA: Diagnosis present

## 2019-10-06 NOTE — Therapy (Signed)
[place] MAIN [facility] SERVICES [address] [place], [place], [address] Phone: [phone]   Fax:  [phone]  Physical Therapy Treatment  Patient Details  Name: Russell Herman MRN: [id number] Date of Birth: 1946/02/01 No data recorded  Encounter Date: 10/06/2019   PT End of Session - 10/06/19 0939    Visit Number 6    Number of Visits 17    Date for PT Re-Evaluation 12/13/19    PT Start Time 0932    PT Stop Time 1015    PT Time Calculation (min) 43 min    Equipment Utilized During Treatment Gait belt    Activity Tolerance Patient tolerated treatment well    Behavior During Therapy WFL for tasks assessed/performed           Past Medical History:  Diagnosis Date  . Adenomatous colon polyp   . Allergy   . Arthritis   . Cancer ([place])   . Coronary artery disease   . GERD (gastroesophageal reflux disease)   . Hypertension   . Wears hearing aid in both ears     Past Surgical History:  Procedure Laterality Date  . cyst removed    . ENDARTERECTOMY Right 08/04/2019   Procedure: ENDARTERECTOMY CAROTID;  Surgeon: Algernon Huxley, MD;  Location: [facility];  Service: Vascular;  Laterality: Right;  . EXCISION OF TONGUE LESION Bilateral 05/05/2019   Procedure: EXCISION OF DORSAL TONGUE LESION;  Surgeon: Margaretha Sheffield, MD;  Location: [place];  Service: ENT;  Laterality: Bilateral;  . skin cancer removed      There were no vitals filed for this visit.   Subjective Assessment - 10/06/19 0938    Subjective Patient reports doing well; He reports continued soreness/stiffness in left shoulder. Reports doing HEP for LE strengthening with good tolerance;    Pertinent History Right lateral thalamic infarct mild ataxia and mild hemiparesis. Patient went to rehab 07/07/19-07/15/19. Then he had surgery 7/1 for  s/p successful right CEA    Currently in Pain? Yes    Pain Score 6     Pain Location Shoulder    Pain Orientation Left    Pain Descriptors  / Indicators Aching;Sore    Pain Type Chronic pain    Pain Onset More than a month ago    Pain Frequency Intermittent    Aggravating Factors  raising arm overhead    Pain Relieving Factors rest/stretching    Effect of Pain on Daily Activities decreased reaching tolerance;    Multiple Pain Sites No              TREATMENT  Warm up on crosstrainer, level 2 x4 min (Unbilled); seat #6 Neuromuscular Re-education Stepping over hurdles #2:  Forward reciprocal x10 reps each without rail assist with CGA for safety;  Side stepping x5 reps each direction without rail assist, close supervision;   Forward lunges on BOSU x10 reps each LE with B rail assist;  Standing on BOSU: -heel/toe raises x15 reps; -feet apart:  Unsupported standing 30 sec hold with increased posterior lean requiring min A for balance recovery;   Unsupported standing BUE ball pass side/side x10 reps;  Unsupported standing mini squat x10 reps Patient required min A for safety for balance recovery, exhibiting increased posterior lean requiring cues for neutral weight shift;   Standing on airex beam: -side stepping without rail assist x3 laps each direction -forward tandem step with 1 rail assist x6 laps Patient required close supervision; does exhibit increased  instability with decreased ankle control especially while on compliant surface;   Resisted walking 12.5# forward/backward, side/side x4 way, x2 laps each with CGA for safety;     Patient tolerated session well. Instructed patient in advanced balance exercise this session. He does exhibit increased posterior loss of balance especially when on compliant surfaces. He denies any increase in pain at end of session;                        PT Education - 10/06/19 0939    Education Details LE strengthening, balance, HEP    Person(s) Educated Patient    Methods Explanation;Verbal cues    Comprehension Verbalized understanding;Returned  demonstration;Verbal cues required;Need further instruction            PT Short Term Goals - 09/14/19 1055      PT SHORT TERM GOAL #1   Title Patient will increase lower extremity functional scale to >60/80 to demonstrate improved functional mobility and increased tolerance with ADLs.    Baseline 09/14/19= 43/80    Time 6    Period Weeks    Status New    Target Date 11/02/19      PT SHORT TERM GOAL #2   Title Patient will be modified independent in walking on even/uneven surface with least restrictive assistive device, for 20+ minutes without rest break, reporting some difficulty or less to improve walking tolerance with community ambulation including grocery shopping, going to church,etc.    Baseline Patient can ambulate for 10 minutes on level surfaces without incline and without AD    Time 6    Period Weeks    Status New    Target Date 11/02/19             PT Long Term Goals - 09/14/19 1057      PT LONG TERM GOAL #1   Title Patient will increase BLE gross strength to 4+/5 as to improve functional strength for independent gait, increased standing tolerance and increased ADL ability.    Baseline LLE 3/5 hip abd, 3+/5 hip flex, knee flex/ext 4/5, ankle 4/5    Time 12    Period Weeks    Status New    Target Date 12/07/19      PT LONG TERM GOAL #2   Title Patient will increase Berg Balance score by > 6 points to demonstrate decreased fall risk during functional activities.    Baseline 09/14/19= 42/56    Time 12    Period Weeks    Status New    Target Date 12/07/19      PT LONG TERM GOAL #3   Title Patient will ascend/descend 4 stairs without rail assist independently without loss of balance to improve ability to get in/out of home.    Baseline 09/14/19= needs UE rail support and no AD    Time 12    Period Weeks    Status New    Target Date 12/07/19      PT LONG TERM GOAL #4   Title Patient will be independent in home exercise program to improve strength/mobility for  better functional independence with ADLs.    Time 12    Period Weeks    Status New    Target Date 12/07/19                 Plan - 10/06/19 1113    Clinical Impression Statement Patient motivated and participated well within session. He was instructed in  advanced balance activities. patient does exhibit increased posterior lean especially when standing on compliant surfaces. He does require CGA to min A for most balance activities. Instructed patient in balance exercise with less rail assist to challenge stance control. He would benefit from additional skilled PT intervention to improve strength, balance and mobility;    Personal Factors and Comorbidities Age;Comorbidity 1    Comorbidities HTN    Examination-Activity Limitations Locomotion Level;Stand    Examination-Participation Restrictions Driving;Yard Work;Meal Prep    Stability/Clinical Decision Making Stable/Uncomplicated    Rehab Potential Good    PT Frequency 2x / week    PT Duration 12 weeks    PT Treatment/Interventions Patient/family education;Balance training;Neuromuscular re-education;Therapeutic exercise;Stair training    PT Next Visit Plan balance and strengthening    PT Home Exercise Plan Medbridge Access Code: [email or web address]    Consulted and Agree with Plan of Care Patient           Patient will benefit from skilled therapeutic intervention in order to improve the following deficits and impairments:  Decreased activity tolerance, Decreased strength, Difficulty walking, Decreased balance, Decreased coordination, Decreased mobility, Decreased endurance  Visit Diagnosis: Muscle weakness (generalized)  Other lack of coordination     Problem List Patient Active Problem List   Diagnosis Date Noted  . Carotid stenosis, right 08/04/2019  . Carotid stenosis 07/29/2019  . Carotid artery plaque, right 07/19/2019  . Hx of completed stroke 07/19/2019  . AKI (acute kidney injury) ([place])   . Hemiparesis affecting left  side as late effect of stroke ([place])   . Decreased GFR   . Dyslipidemia   . Stroke ([place]) 07/06/2019  . TIA (transient ischemic attack) 07/05/2019  . Hypokalemia 07/05/2019  . GERD without esophagitis 06/18/2017  . Essential hypertension 08/19/2016  . Vertigo 12/05/2015  . Adenomatous colon polyp 01/31/2014  . Basal cell carcinoma 09/23/2013  . Hay fever 09/23/2013  . Vitamin D deficiency 09/23/2013  . Gastric catarrh 11/28/2011  . Hyperlipidemia, mixed 11/28/2011  . Preop cardiovascular exam 11/28/2011    Josian Lanese PT, DPT 10/06/2019, 11:15 AM  [place] MAIN [facility] SERVICES [address] [place], [place], [address] Phone: [phone]   Fax:  [phone]  Name: Russell Herman MRN: [id number] Date of Birth: 05-17-1945

## 2019-10-11 ENCOUNTER — Ambulatory Visit: Payer: Medicare Other | Admitting: Physical Therapy

## 2019-10-11 ENCOUNTER — Other Ambulatory Visit: Payer: Self-pay

## 2019-10-11 ENCOUNTER — Ambulatory Visit: Payer: Medicare Other | Admitting: Occupational Therapy

## 2019-10-11 ENCOUNTER — Encounter: Payer: Self-pay | Admitting: Physical Therapy

## 2019-10-11 ENCOUNTER — Encounter: Payer: Self-pay | Admitting: Occupational Therapy

## 2019-10-11 DIAGNOSIS — R278 Other lack of coordination: Secondary | ICD-10-CM

## 2019-10-11 DIAGNOSIS — M6281 Muscle weakness (generalized): Secondary | ICD-10-CM

## 2019-10-11 DIAGNOSIS — I69354 Hemiplegia and hemiparesis following cerebral infarction affecting left non-dominant side: Secondary | ICD-10-CM

## 2019-10-11 DIAGNOSIS — I6309 Cerebral infarction due to thrombosis of other precerebral artery: Secondary | ICD-10-CM

## 2019-10-11 NOTE — Therapy (Signed)
[place] MAIN [facility] SERVICES [address] [place], [place], [address] Phone: [phone]   Fax:  [phone]  Physical Therapy Treatment  Patient Details  Name: Russell Herman MRN: [id number] Date of Birth: 1945/05/11 No data recorded  Encounter Date: 10/11/2019   PT End of Session - 10/11/19 1115    Visit Number 7    Number of Visits 17    Date for PT Re-Evaluation 12/13/19    PT Start Time 1100    PT Stop Time 1140    PT Time Calculation (min) 40 min    Equipment Utilized During Treatment Gait belt    Activity Tolerance Patient tolerated treatment well    Behavior During Therapy WFL for tasks assessed/performed           Past Medical History:  Diagnosis Date  . Adenomatous colon polyp   . Allergy   . Arthritis   . Cancer ([place])   . Coronary artery disease   . GERD (gastroesophageal reflux disease)   . Hypertension   . Wears hearing aid in both ears     Past Surgical History:  Procedure Laterality Date  . cyst removed    . ENDARTERECTOMY Right 08/04/2019   Procedure: ENDARTERECTOMY CAROTID;  Surgeon: Algernon Huxley, MD;  Location: [facility];  Service: Vascular;  Laterality: Right;  . EXCISION OF TONGUE LESION Bilateral 05/05/2019   Procedure: EXCISION OF DORSAL TONGUE LESION;  Surgeon: Margaretha Sheffield, MD;  Location: [place];  Service: ENT;  Laterality: Bilateral;  . skin cancer removed      There were no vitals filed for this visit.   Subjective Assessment - 10/11/19 1113    Subjective Patient reports doing well; He reports continued soreness/stiffness in left shoulder. Reports doing HEP for LE strengthening with good tolerance;    Pertinent History Right lateral thalamic infarct mild ataxia and mild hemiparesis. Patient went to rehab 07/07/19-07/15/19. Then he had surgery 7/1 for  s/p successful right CEA    Currently in Pain? Yes    Pain Score 3     Pain Location Shoulder    Pain Orientation Left    Pain Descriptors  / Indicators Aching    Pain Type Chronic pain    Pain Onset More than a month ago    Pain Frequency Constant    Aggravating Factors  using his arm over his head    Pain Relieving Factors rest    Effect of Pain on Daily Activities slow mobility    Multiple Pain Sites No           Neuromuscular Re-education  Rocker board fwd/bwd, side to side x 20 each direction Heel/toe raises without UE support 3s hold x 10 each 1/2 foam roll balance with flat side up 30s x 2 reps 1/2 foam roll balance with flat side down 30s x 2 reps 1/2 foam roll tandem balance alternating forward LE 30s x 2 each LE forward Lateral side steps from foam to 6 inch stool left and right x 15 Backwards stepping from foam to 6 inch stool x 15  Matrix x 5 reps fwd/ bwd, side to side x 22. 5 lbs       Pt educated throughout session about proper posture and technique with exercises. Improved exercise technique, movement at target joints, use of target muscles after min to mod verbal, visual, tactile cues.  PT Education - 10/11/19 1114    Education Details HEP    Person(s) Educated Patient    Methods Explanation;Demonstration;Verbal cues    Comprehension Verbalized understanding;Returned demonstration;Need further instruction            PT Short Term Goals - 09/14/19 1055      PT SHORT TERM GOAL #1   Title Patient will increase lower extremity functional scale to >60/80 to demonstrate improved functional mobility and increased tolerance with ADLs.    Baseline 09/14/19= 43/80    Time 6    Period Weeks    Status New    Target Date 11/02/19      PT SHORT TERM GOAL #2   Title Patient will be modified independent in walking on even/uneven surface with least restrictive assistive device, for 20+ minutes without rest break, reporting some difficulty or less to improve walking tolerance with community ambulation including grocery shopping, going to church,etc.    Baseline  Patient can ambulate for 10 minutes on level surfaces without incline and without AD    Time 6    Period Weeks    Status New    Target Date 11/02/19             PT Long Term Goals - 09/14/19 1057      PT LONG TERM GOAL #1   Title Patient will increase BLE gross strength to 4+/5 as to improve functional strength for independent gait, increased standing tolerance and increased ADL ability.    Baseline LLE 3/5 hip abd, 3+/5 hip flex, knee flex/ext 4/5, ankle 4/5    Time 12    Period Weeks    Status New    Target Date 12/07/19      PT LONG TERM GOAL #2   Title Patient will increase Berg Balance score by > 6 points to demonstrate decreased fall risk during functional activities.    Baseline 09/14/19= 42/56    Time 12    Period Weeks    Status New    Target Date 12/07/19      PT LONG TERM GOAL #3   Title Patient will ascend/descend 4 stairs without rail assist independently without loss of balance to improve ability to get in/out of home.    Baseline 09/14/19= needs UE rail support and no AD    Time 12    Period Weeks    Status New    Target Date 12/07/19      PT LONG TERM GOAL #4   Title Patient will be independent in home exercise program to improve strength/mobility for better functional independence with ADLs.    Time 12    Period Weeks    Status New    Target Date 12/07/19                 Plan - 10/11/19 1115    Clinical Impression Statement Patient instructed in intermediate strengthening and balance exercise.  Patient requires min Vcs for correct exercise technique including to improve LE control with standing exercise. Patient demonstrates better quad control with SLS tasks with rail assist. Patient would benefit from additional skilled PT intervention to improve balance/gait safety and reduce fall risk.   Personal Factors and Comorbidities Age;Comorbidity 1    Comorbidities HTN    Examination-Activity Limitations Locomotion Level;Stand     Examination-Participation Restrictions Driving;Yard Work;Meal Prep    Stability/Clinical Decision Making Stable/Uncomplicated    Rehab Potential Good    PT Frequency 2x / week  PT Duration 12 weeks    PT Treatment/Interventions Patient/family education;Balance training;Neuromuscular re-education;Therapeutic exercise;Stair training    PT Next Visit Plan balance and strengthening    PT Home Exercise Plan Medbridge Access Code: [email or web address]    Consulted and Agree with Plan of Care Patient           Patient will benefit from skilled therapeutic intervention in order to improve the following deficits and impairments:  Decreased activity tolerance, Decreased strength, Difficulty walking, Decreased balance, Decreased coordination, Decreased mobility, Decreased endurance  Visit Diagnosis: Muscle weakness (generalized)  Other lack of coordination  Hemiparesis affecting left side as late effect of stroke (HCC)  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 [facility])     Problem List Patient Active Problem List   Diagnosis Date Noted  . Carotid stenosis, right 08/04/2019  . Carotid stenosis 07/29/2019  . Carotid artery plaque, right 07/19/2019  . Hx of completed stroke 07/19/2019  . AKI (acute kidney injury) ([place])   . Hemiparesis affecting left side as late effect of stroke ([place])   . Decreased GFR   . Dyslipidemia   . Stroke ([place]) 07/06/2019  . TIA (transient ischemic attack) 07/05/2019  . Hypokalemia 07/05/2019  . GERD without esophagitis 06/18/2017  . Essential hypertension 08/19/2016  . Vertigo 12/05/2015  . Adenomatous colon polyp 01/31/2014  . Basal cell carcinoma 09/23/2013  . Hay fever 09/23/2013  . Vitamin D deficiency 09/23/2013  . Gastric catarrh 11/28/2011  . Hyperlipidemia, mixed 11/28/2011  . Preop cardiovascular exam 11/28/2011    Alanson Puls, [place] DPT 10/11/2019, 11:16 AM  [place] MAIN  [facility] SERVICES [address] [place], [place], [address] Phone: [phone]   Fax:  [phone]  Name: Russell Herman MRN: [id number] Date of Birth: 11/22/1945

## 2019-10-11 NOTE — Therapy (Addendum)
[place] MAIN [facility] SERVICES [address] [place], [place], [address] Phone: [phone]   Fax:  [phone]  Occupational Therapy Treatment  Patient Details  Name: Russell Herman MRN: [id number] Date of Birth: 07-Dec-1945 Referring Provider (OT): Dr. Letta Pate   Encounter Date: 10/11/2019   OT End of Session - 10/11/19 1024    Visit Number 5    Number of Visits 24    Date for OT Re-Evaluation 12/07/19    OT Start Time 1020    OT Stop Time 1100    OT Time Calculation (min) 40 min    Activity Tolerance Patient tolerated treatment well    Behavior During Therapy [facility] for tasks assessed/performed           Past Medical History:  Diagnosis Date  . Adenomatous colon polyp   . Allergy   . Arthritis   . Cancer ([place])   . Coronary artery disease   . GERD (gastroesophageal reflux disease)   . Hypertension   . Wears hearing aid in both ears     Past Surgical History:  Procedure Laterality Date  . cyst removed    . ENDARTERECTOMY Right 08/04/2019   Procedure: ENDARTERECTOMY CAROTID;  Surgeon: Algernon Huxley, MD;  Location: [facility];  Service: Vascular;  Laterality: Right;  . EXCISION OF TONGUE LESION Bilateral 05/05/2019   Procedure: EXCISION OF DORSAL TONGUE LESION;  Surgeon: Margaretha Sheffield, MD;  Location: [place];  Service: ENT;  Laterality: Bilateral;  . skin cancer removed      There were no vitals filed for this visit.   Subjective Assessment - 10/11/19 1023    Subjective  Pt. reports that he has been doing yardwork mowing his lawn.    Patient is accompanied by: Family member    Pertinent History Pt. is a 74 y.o. male who was hospitalized with a CVA on June 1st, 2021. Imaging revealed a small acute infarct in right lateral thalamus, and mild small vessel disease. Pt. received inpatient rehab services, and home health services. Pt. underwent  a Right Carotid Endartectomy on 08/04/2019. Pt. resides with his wife, and has  supportive family. Pt. is a retired Administrator for [employer], and enjoys gardening.    Currently in Pain? Yes    Pain Score 5     Pain Orientation Left    Pain Descriptors / Indicators Aching;Tightness    Pain Type Chronic pain          OT TREATMENT   Therapeutic Exercise:  Pt. performed BUE therapeutic. ex scapular protraction in supine with the shoulder flexed to 90 degrees, and the elbow extended. Pt. performed shoulder stabilization exercises in supine. Pt. worked on pinch strengthening in the left hand for lateral, and 3pt. pinch using yellow, red, green, blue, and black resistive clips. Pt. worked on placing the clips at various vertical and horizontal angles. Tactile and verbal cues were required for eliciting the desired movement.  Neuromuscular re-ed:  Pt. worked on [facility] skills grasping 1" sticks, 1/4" collars, and 1/4" washers. Pt. worked on storing the objects in the palm, and translatory skills moving the items from the palm of the hand to the tip of the 2nd digit, and thumb. Pt. worked on removing the pegs using bilateral alternating hand patterns.  Manual Therapy:  Pt.tolerated scapular mobilizations in sidelying for elevation, depression, abduction/rotation. Pt.tolerated anterior, posterior, and inferior glides at the [facility] joint for flexion, and abduction. Manual therapy was performed independent of, and in  preparation for ROM,a nd there. Ex.   Pt. reports that he is doing yard work at home. Pt. Reported 5/10 pain in the left shoulder initially, and reports that his pain feels better. Pt. reports pain at 128 degrees of shoulder flexion in the ROM arc, and at 40 degrees when returning his UE to his side after flexion.Pt. responded well tomanual techniques, and ROM for the left shoulder. Pt. presented with decreased pain following there. Ex, and manual techniques. Pt. dropped multiple small objects when performing [facility] skills, and translatory movements of the hand.  Pt.continues to need work on improving and normalizing tone, improving strength, and [place] skills in his left hand. Pt.continues to work on improving LUE strength, motor control, and [facility] skills in order to improve LUE fiunctioning during ADLS, and IADLs, and maximize overall independence.                        OT Education - 10/11/19 1024    Education Details OT services, goal, POC.    Person(s) Educated Patient    Methods Explanation    Comprehension Verbalized understanding;Need further instruction               OT Long Term Goals - 09/14/19 1649      OT LONG TERM GOAL #1   Title Pt. will improve LUE strength by 1 muscle grade to assist with gardening, and yardwork.    Baseline Eval: 4/5 shoulder flexion, abduction. Pt. is unable to garden, and perform yardwork    Time 12    Period Weeks    Status New    Target Date 12/07/19      OT LONG TERM GOAL #2   Title Pt. will improve FOTO score by 2 points to maximize maximize independence with ADLs, and IADL functioning.    Baseline Intake score: 59    Time 12    Period Weeks    Status New    Target Date 12/07/19      OT LONG TERM GOAL #3   Title Pt will improve Left hand [facility] skills in order to be able to manipulate small objects    Baseline Eval: Left hand [facility] skills are limited. pt. has difficulty manipulating small objects efficiently during ADLs, and IADLs.    Time 12    Period Weeks    Status New    Target Date 12/07/19      OT LONG TERM GOAL #4   Title Pt. will imprive left hand motor control skills to be able to independently brush his hair with his LUE.    Baseline Eval: Pt. has difficulty    Time 12    Period Weeks    Status New    Target Date 12/07/19      OT LONG TERM GOAL #5   Title Pt. will use his left hand to be able to independently hold, and use a washcloth    Baseline Eval: Pt. has difficulty    Time 12    Period Weeks    Status New    Target Date 12/07/19                  Plan - 10/11/19 1124    Clinical Impression Statement Pt. reports that he is doing yard work at home. Pt. Reported 5/10 pain in the left shoulder initially, and reports that his pain feels better. Pt. reports pain at 128 degrees of shoulder flexion in the ROM arc, and  at 40 degrees when returning his UE to his side after flexion.Pt. responded well tomanual techniques, and ROM for the left shoulder. Pt. presented with decreased pain following there. Ex, and manual techniques. Pt. dropped multiple small objects when performing [facility] skills, and translatory movements of the hand. Pt.continues to need work on improving and normalizing tone, improving strength, and [place] skills in his left hand. Pt.continues to work on improving LUE strength, motor control, and [facility] skills in order to improve LUE fiunctioning during ADLS, and IADLs, and maximize overall independence.    Occupational performance deficits (Please refer to evaluation for details): ADL's;IADL's    Body Structure / Function / Physical Skills ADL;IADL;Balance;Proprioception;Strength;Coordination;UE functional use;FMC;Dexterity    Rehab Potential Good    Clinical Decision Making Several treatment options, min-mod task modification necessary    Comorbidities Affecting Occupational Performance: May have comorbidities impacting occupational performance    Modification or Assistance to Complete Evaluation  Min-Moderate modification of tasks or assist with assess necessary to complete eval    OT Frequency 2x / week    OT Duration 12 weeks    OT Treatment/Interventions Self-care/ADL training;Therapeutic exercise;DME and/or AE instruction;Patient/family education;Therapeutic activities;Energy conservation;Neuromuscular education    Consulted and Agree with Plan of Care Patient           Patient will benefit from skilled therapeutic intervention in order to improve the following deficits and impairments:   Body Structure / Function /  Physical Skills: ADL, IADL, Balance, Proprioception, Strength, Coordination, UE functional use, FMC, Dexterity       Visit Diagnosis: Muscle weakness (generalized)  Other lack of coordination    Problem List Patient Active Problem List   Diagnosis Date Noted  . Carotid stenosis, right 08/04/2019  . Carotid stenosis 07/29/2019  . Carotid artery plaque, right 07/19/2019  . Hx of completed stroke 07/19/2019  . AKI (acute kidney injury) ([place])   . Hemiparesis affecting left side as late effect of stroke ([place])   . Decreased GFR   . Dyslipidemia   . Stroke ([place]) 07/06/2019  . TIA (transient ischemic attack) 07/05/2019  . Hypokalemia 07/05/2019  . GERD without esophagitis 06/18/2017  . Essential hypertension 08/19/2016  . Vertigo 12/05/2015  . Adenomatous colon polyp 01/31/2014  . Basal cell carcinoma 09/23/2013  . Hay fever 09/23/2013  . Vitamin D deficiency 09/23/2013  . Gastric catarrh 11/28/2011  . Hyperlipidemia, mixed 11/28/2011  . Preop cardiovascular exam 11/28/2011    Harrel Carina, MS, OTR/L 10/11/2019, 11:30 AM  [place] MAIN [facility] SERVICES [address] [place], [place], [address] Phone: [phone]   Fax:  [phone]  Name: Russell Herman MRN: [id number] Date of Birth: [birth year]

## 2019-10-13 ENCOUNTER — Encounter: Payer: Medicare Other | Admitting: Occupational Therapy

## 2019-10-13 ENCOUNTER — Ambulatory Visit: Payer: Medicare Other

## 2019-10-13 ENCOUNTER — Other Ambulatory Visit: Payer: Self-pay

## 2019-10-13 DIAGNOSIS — M6281 Muscle weakness (generalized): Secondary | ICD-10-CM | POA: Diagnosis not present

## 2019-10-13 DIAGNOSIS — R278 Other lack of coordination: Secondary | ICD-10-CM

## 2019-10-13 NOTE — Therapy (Signed)
[place] MAIN [facility] SERVICES [address] [place], [place], [address] Phone: [phone]   Fax:  [phone]  Physical Therapy Treatment  Patient Details  Name: Russell Herman MRN: [id number] Date of Birth: 09/04/1945 No data recorded  Encounter Date: 10/13/2019   PT End of Session - 10/13/19 1136    Visit Number 8    Number of Visits 17    Date for PT Re-Evaluation 12/13/19    PT Start Time 1856    PT Stop Time 1230    PT Time Calculation (min) 45 min    Equipment Utilized During Treatment Gait belt    Activity Tolerance Patient tolerated treatment well    Behavior During Therapy WFL for tasks assessed/performed           Past Medical History:  Diagnosis Date  . Adenomatous colon polyp   . Allergy   . Arthritis   . Cancer ([place])   . Coronary artery disease   . GERD (gastroesophageal reflux disease)   . Hypertension   . Wears hearing aid in both ears     Past Surgical History:  Procedure Laterality Date  . cyst removed    . ENDARTERECTOMY Right 08/04/2019   Procedure: ENDARTERECTOMY CAROTID;  Surgeon: Algernon Huxley, MD;  Location: [facility];  Service: Vascular;  Laterality: Right;  . EXCISION OF TONGUE LESION Bilateral 05/05/2019   Procedure: EXCISION OF DORSAL TONGUE LESION;  Surgeon: Margaretha Sheffield, MD;  Location: [facility];  Service: ENT;  Laterality: Bilateral;  . skin cancer removed      There were no vitals filed for this visit.   Subjective Assessment - 10/13/19 1136    Subjective Patient reports doing well today however his L shoulder continues to be bothersome. He sees Dr. Letta Pate tomorrow and is going to discuss it at that time. He currently rates his shoulder pain as 7/10. Reports doing HEP for LE strengthening with good tolerance.  No specific questions or concerns at this time    Pertinent History Right lateral thalamic infarct mild ataxia and mild hemiparesis. Patient went to rehab 07/07/19-07/15/19. Then he  had surgery 7/1 for  s/p successful right CEA    Currently in Pain? Yes    Pain Score 7     Pain Location Shoulder    Pain Orientation Left    Pain Descriptors / Indicators Aching    Pain Type Chronic pain    Pain Onset More than a month ago    Pain Frequency Constant             TREATMENT   Ther-ex  Nu-step fitness x 5 mins L3-4 during history taking (4 minutes unbilled) Leg press 40# x 20, 55# x 20, 70# x 20; Sit to stand from regular height chair with right lower extremity on 6 inch step, no upper extremity support 2x10;  Seated hamstring step stretch BLE 2 x 30s; Calf stretching BLE with Prostretch 2 x 30s;  Standing exercises with 4# AW with BUE support: Hip flexion marches x 20 BLE; Hip extension x 20 BLE; Hip abduction x 20 BLE; HS curls x 20 BLE;  Heel/Toe raises x 20;   Neuromuscular Re-education  Alternating step taps from airex pad to 6" step x 10 each LE, with no UE support Airex NBOS eyes open/closed x 30s each; Airex NBOS horizontal and vertical head turns; Airex step-ups to 6" step with additional Airex pad on top alternating leading LE x 10 each; 1/2  foam roll balance with flat side up 30s x 2 reps    Patient demonstrates excellent motivation throughout physical therapy session.  Continued with stretching, balance, and strengthening exercises during session today.  Increased ankle weights for standing resisted strengthening.  Patient challenged with unstable surfaces during balance training as well as single-leg stance.  Patient encouraged to continue HEP and follow-up as scheduled. Pt will benefit from PT services to address deficits in strength, balance, and mobility in order to return to full function at home.                                 PT Short Term Goals - 09/14/19 1055      PT SHORT TERM GOAL #1   Title Patient will increase lower extremity functional scale to >60/80 to demonstrate improved functional mobility and  increased tolerance with ADLs.    Baseline 09/14/19= 43/80    Time 6    Period Weeks    Status New    Target Date 11/02/19      PT SHORT TERM GOAL #2   Title Patient will be modified independent in walking on even/uneven surface with least restrictive assistive device, for 20+ minutes without rest break, reporting some difficulty or less to improve walking tolerance with community ambulation including grocery shopping, going to church,etc.    Baseline Patient can ambulate for 10 minutes on level surfaces without incline and without AD    Time 6    Period Weeks    Status New    Target Date 11/02/19             PT Long Term Goals - 09/14/19 1057      PT LONG TERM GOAL #1   Title Patient will increase BLE gross strength to 4+/5 as to improve functional strength for independent gait, increased standing tolerance and increased ADL ability.    Baseline LLE 3/5 hip abd, 3+/5 hip flex, knee flex/ext 4/5, ankle 4/5    Time 12    Period Weeks    Status New    Target Date 12/07/19      PT LONG TERM GOAL #2   Title Patient will increase Berg Balance score by > 6 points to demonstrate decreased fall risk during functional activities.    Baseline 09/14/19= 42/56    Time 12    Period Weeks    Status New    Target Date 12/07/19      PT LONG TERM GOAL #3   Title Patient will ascend/descend 4 stairs without rail assist independently without loss of balance to improve ability to get in/out of home.    Baseline 09/14/19= needs UE rail support and no AD    Time 12    Period Weeks    Status New    Target Date 12/07/19      PT LONG TERM GOAL #4   Title Patient will be independent in home exercise program to improve strength/mobility for better functional independence with ADLs.    Time 12    Period Weeks    Status New    Target Date 12/07/19                 Plan - 10/13/19 1137    Clinical Impression Statement Patient demonstrates excellent motivation throughout physical therapy  session.  Continued with stretching, balance, and strengthening exercises during session today.  Increased ankle weights for standing resisted strengthening.  Patient challenged with unstable surfaces during balance training as well as single-leg stance.  Patient encouraged to continue HEP and follow-up as scheduled. Pt will benefit from PT services to address deficits in strength, balance, and mobility in order to return to full function at home.    Personal Factors and Comorbidities Age;Comorbidity 1    Comorbidities HTN    Examination-Activity Limitations Locomotion Level;Stand    Examination-Participation Restrictions Driving;Yard Work;Meal Prep    Stability/Clinical Decision Making Stable/Uncomplicated    Rehab Potential Good    PT Frequency 2x / week    PT Duration 12 weeks    PT Treatment/Interventions Patient/family education;Balance training;Neuromuscular re-education;Therapeutic exercise;Stair training    PT Next Visit Plan balance and strengthening    PT Home Exercise Plan Medbridge Access Code: [email or web address]    Consulted and Agree with Plan of Care Patient           Patient will benefit from skilled therapeutic intervention in order to improve the following deficits and impairments:  Decreased activity tolerance, Decreased strength, Difficulty walking, Decreased balance, Decreased coordination, Decreased mobility, Decreased endurance  Visit Diagnosis: Muscle weakness (generalized)  Other lack of coordination     Problem List Patient Active Problem List   Diagnosis Date Noted  . Carotid stenosis, right 08/04/2019  . Carotid stenosis 07/29/2019  . Carotid artery plaque, right 07/19/2019  . Hx of completed stroke 07/19/2019  . AKI (acute kidney injury) ([place])   . Hemiparesis affecting left side as late effect of stroke ([place])   . Decreased GFR   . Dyslipidemia   . Stroke ([place]) 07/06/2019  . TIA (transient ischemic attack) 07/05/2019  . Hypokalemia 07/05/2019  . GERD  without esophagitis 06/18/2017  . Essential hypertension 08/19/2016  . Vertigo 12/05/2015  . Adenomatous colon polyp 01/31/2014  . Basal cell carcinoma 09/23/2013  . Hay fever 09/23/2013  . Vitamin D deficiency 09/23/2013  . Gastric catarrh 11/28/2011  . Hyperlipidemia, mixed 11/28/2011  . Preop cardiovascular exam 11/28/2011   Phillips Grout PT, DPT, GCS  Lachrista Heslin 10/13/2019, 2:38 PM  [place] MAIN [facility] SERVICES [address] [place], [place], [address] Phone: [phone]   Fax:  [phone]  Name: Russell Herman MRN: [id number] Date of Birth: 12/03/1945

## 2019-10-14 ENCOUNTER — Encounter: Payer: Self-pay | Admitting: Physical Medicine & Rehabilitation

## 2019-10-14 ENCOUNTER — Encounter: Payer: Medicare Other | Attending: Physical Medicine & Rehabilitation | Admitting: Physical Medicine & Rehabilitation

## 2019-10-14 ENCOUNTER — Other Ambulatory Visit: Payer: Self-pay

## 2019-10-14 VITALS — BP 149/83 | HR 73 | Temp 98.2°F | Ht 65.0 in | Wt 155.8 lb

## 2019-10-14 DIAGNOSIS — I639 Cerebral infarction, unspecified: Secondary | ICD-10-CM

## 2019-10-14 DIAGNOSIS — I69354 Hemiplegia and hemiparesis following cerebral infarction affecting left non-dominant side: Secondary | ICD-10-CM | POA: Insufficient documentation

## 2019-10-14 DIAGNOSIS — M7542 Impingement syndrome of left shoulder: Secondary | ICD-10-CM | POA: Diagnosis not present

## 2019-10-14 NOTE — Patient Instructions (Signed)

## 2019-10-14 NOTE — Progress Notes (Signed)
Subjective:    [name], male    DOB: [birth year], 74 y.o.   MRN: [id number] 74 y.o. RH- male with history of HTN, GERD, BCC who was admitted to [facility] on 07/05/19 after waking up with left sided weakness, numbness and difficulty walking.  CT head was negative for acute changes.  MRI brain revealed small acute infarct in right lateral thalamus and mild small vessel disease.  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  Stroke was felt to be due to small vessel disease and neurology recommending DAPT x3 months.  Dr. Lucky Cowboy was consulted for input on CAS and plans for surgical intervention on outpatient basis.  Therapy has been ongoing and patient continues to be limited by left-sided weakness with left knee instability and decrease in coordination of LUE.   HPI  Patient returns today with chief complaint of left shoulder pain.  No falls or trauma to the area.  It started sometime in the last 4 weeks or so.  No history of shoulder surgery.  The patient denies any issues with neck pain.  No tenderness in the shoulder area mainly pain with overhead movement. The patient continues numbness and tingling in the fingers as well as the left toes. He is modified independent with all self-care and mobility He continues to receive PT and feels like he is making very good progress with his balance.  He does not feel like he is made as much progress with the left upper extremity function due to his shoulder pain.    Pain Inventory Average Pain 5 Pain Right Now 8 My pain is tingling and aching  LOCATION OF PAIN  Shoulder, elbow, wrist, hand  BOWEL Number of stools per week: 7 Oral laxative use No  Type of laxative na Enema or suppository use No  History of colostomy No  Incontinent No   BLADDER Normal In and out cath, frequency na Able to self cath No  Bladder incontinence No  Frequent urination No   Leakage with coughing No  Difficulty starting stream No  Incomplete bladder emptying No    Mobility how many minutes can you walk? 45 ability to climb steps?  yes do you drive?  yes  Function retired I need assistance with the following:  meal prep and shopping  Neuro/Psych bowel control problems  Prior Studies Any changes since last visit?  no  Physicians involved in your care Any changes since last visit?  no   Family History  Problem Relation Age of Onset  . Breast cancer Mother   . Melanoma Father        mets to lung  . Breast cancer Sister   . Kidney failure Brother        s/p transplant   Social History   Socioeconomic History  . Marital status: Married    Spouse name: Not on file  . Number of children: Not on file  . Years of education: Not on file  . Highest education level: Not on file  Occupational History  . Not on file  Tobacco Use  . Smoking status: Never Smoker  . Smokeless tobacco: Never Used  . Tobacco comment: smoked "some" as teenager  Vaping Use  . Vaping Use: Never used  Substance and Sexual Activity  . Alcohol use: Not Currently    Alcohol/week: 0.0 standard drinks  . Drug use: Not Currently  . Sexual activity: Not  on file  Other Topics Concern  . Not on file  Social History Narrative  . Not on file   Social Determinants of Health   Financial Resource Strain:   . Difficulty of Paying Living Expenses: Not on file  Food Insecurity:   . Worried About Charity fundraiser in the Last Year: Not on file  . Ran Out of Food in the Last Year: Not on file  Transportation Needs:   . Lack of Transportation (Medical): Not on file  . Lack of Transportation (Non-Medical): Not on file  Physical Activity:   . Days of Exercise per Week: Not on file  . Minutes of Exercise per Session: Not on file  Stress:   . Feeling of Stress : Not on file  Social Connections:   . Frequency of Communication with Friends and Family: Not on file  .  Frequency of Social Gatherings with Friends and Family: Not on file  . Attends Religious Services: Not on file  . Active Member of Clubs or Organizations: Not on file  . Attends Archivist Meetings: Not on file  . Marital Status: Not on file   Past Surgical History:  Procedure Laterality Date  . cyst removed    . ENDARTERECTOMY Right 08/04/2019   Procedure: ENDARTERECTOMY CAROTID;  Surgeon: Algernon Huxley, MD;  Location: [facility];  Service: Vascular;  Laterality: Right;  . EXCISION OF TONGUE LESION Bilateral 05/05/2019   Procedure: EXCISION OF DORSAL TONGUE LESION;  Surgeon: Margaretha Sheffield, MD;  Location: [place];  Service: ENT;  Laterality: Bilateral;  . skin cancer removed     Past Medical History:  Diagnosis Date  . Adenomatous colon polyp   . Allergy   . Arthritis   . Cancer ([place])   . Coronary artery disease   . GERD (gastroesophageal reflux disease)   . Hypertension   . Wears hearing aid in both ears    BP (!) 149/83   Pulse 73   Temp 98.2 F (36.8 C)   Ht 5\' 5"  (1.651 m)   Wt 155 lb 12.8 oz (70.7 kg)   SpO2 95%   BMI 25.93 kg/m   Opioid Risk Score:   Fall Risk Score:  `1  Depression screen PHQ 2/9  Depression screen PHQ 2/9 09/02/2019  Decreased Interest 0  Down, Depressed, Hopeless 0  PHQ - 2 Score 0  Altered sleeping 0  Tired, decreased energy 3  Change in appetite 0  Feeling bad or failure about yourself  0  Trouble concentrating 0  Moving slowly or fidgety/restless 3  Suicidal thoughts 0  PHQ-9 Score 6  Difficult doing work/chores Somewhat difficult    Review of Systems  Gastrointestinal: Positive for constipation.  Neurological: Positive for weakness and numbness.       Tingling  All other systems reviewed and are negative.      Objective:   Physical Exam Constitutional:      Appearance: Normal appearance.  Eyes:     Extraocular Movements: Extraocular movements intact.     Conjunctiva/sclera: Conjunctivae normal.      Pupils: Pupils are equal, round, and reactive to light.  Musculoskeletal:     Right shoulder: Normal.     Left shoulder: No swelling, deformity, effusion, laceration, tenderness or crepitus. Decreased range of motion.     Cervical back: Normal range of motion.  Neurological:     Mental Status: He is alert.  Psychiatric:        Mood and  Affect: Mood normal.        Behavior: Behavior normal.        Thought Content: Thought content normal.        Judgment: Judgment normal.   Positive impingement sign left shoulder at 90 degrees.  No evidence of atrophy in the shoulder girdle musculature. Has normal range of motion bilateral shoulders.  Motor strength is 4/5 in the left deltoid bicep tricep grip hip flexor knee extensor ankle dorsiflexor 5/5 in the right deltoid bicep tricep grip hip flexor knee extensor ankle dorsiflexor Sensation is intact to light touch in bilateral upper and lower limbs Ambulates without assistive device no evidence of toe drag or knee instability      Assessment & Plan:  #1.  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  The paresthesias are not particularly painful but if they become so may benefit from gabapentin,, he should continue with outpatient PT OT.  He has been cleared to drive.  He will continue his Plavix for stroke prophylaxis and follow-up with neurology  #2.  Left shoulder impingement syndrome, patient has altered glenohumeral and scapulothoracic movement due to CVA.  We will do subacromial injection today and ask OT to work on scapulothoracic stabilization.  I will see him back in 6 weeks and if pain has not subsided would do ultrasound-guided subacromial injection  Shoulder injection Left    Indication:Left Shoulder pain not relieved by medication management and other conservative care.  Informed consent was obtained after describing risks and benefits of the procedure with the patient,  this includes bleeding, bruising, infection and medication side effects. The patient wishes to proceed and has given written consent. Patient was placed in a seated position. TheLeft shoulder was marked and prepped with betadine in the subacromial area. A 25-gauge 1-1/2 inch needle was inserted into the subacromial area. After negative draw back for blood, a solution containing 1 mL of 6 mg per ML betamethasone and 4 mL of 1% lidocaine was injected. A band aid was applied. The patient tolerated the procedure well. Post procedure instructions were given.

## 2019-10-17 ENCOUNTER — Other Ambulatory Visit: Payer: Self-pay

## 2019-10-17 ENCOUNTER — Encounter: Payer: Self-pay | Admitting: Physical Therapy

## 2019-10-17 ENCOUNTER — Ambulatory Visit: Payer: Medicare Other | Admitting: Physical Therapy

## 2019-10-17 DIAGNOSIS — M6281 Muscle weakness (generalized): Secondary | ICD-10-CM | POA: Diagnosis not present

## 2019-10-17 DIAGNOSIS — R278 Other lack of coordination: Secondary | ICD-10-CM

## 2019-10-17 DIAGNOSIS — I6309 Cerebral infarction due to thrombosis of other precerebral artery: Secondary | ICD-10-CM

## 2019-10-17 DIAGNOSIS — I69354 Hemiplegia and hemiparesis following cerebral infarction affecting left non-dominant side: Secondary | ICD-10-CM

## 2019-10-17 NOTE — Therapy (Signed)
[place] MAIN [facility] SERVICES [address] [place], [place], [address] Phone: [phone]   Fax:  [phone]  Physical Therapy Treatment  Patient Details  Name: Russell Herman MRN: [id number] Date of Birth: [birth year] No data recorded  Encounter Date: 10/17/2019   PT End of Session - 10/17/19 1026    Visit Number 9    Number of Visits 17    Date for PT Re-Evaluation 12/13/19    PT Start Time 1020    PT Stop Time 1100    PT Time Calculation (min) 40 min    Equipment Utilized During Treatment Gait belt    Activity Tolerance Patient tolerated treatment well    Behavior During Therapy WFL for tasks assessed/performed           Past Medical History:  Diagnosis Date  . Adenomatous colon polyp   . Allergy   . Arthritis   . Cancer ([place])   . Coronary artery disease   . GERD (gastroesophageal reflux disease)   . Hypertension   . Wears hearing aid in both ears     Past Surgical History:  Procedure Laterality Date  . cyst removed    . ENDARTERECTOMY Right 08/04/2019   Procedure: ENDARTERECTOMY CAROTID;  Surgeon: Algernon Huxley, MD;  Location: [facility];  Service: Vascular;  Laterality: Right;  . EXCISION OF TONGUE LESION Bilateral 05/05/2019   Procedure: EXCISION OF DORSAL TONGUE LESION;  Surgeon: Margaretha Sheffield, MD;  Location: [place];  Service: ENT;  Laterality: Bilateral;  . skin cancer removed      There were no vitals filed for this visit.   Subjective Assessment - 10/17/19 1023    Subjective Patient reports doing well today however his L shoulder continues to be bothersome. He saw Dr. Letta Pate for a cortisone shot. He currently rates his shoulder pain as 4/10. Reports doing HEP for LE strengthening with good tolerance.  No specific questions or concerns at this time    Pertinent History Right lateral thalamic infarct mild ataxia and mild hemiparesis. Patient went to rehab 07/07/19-07/15/19. Then he had surgery 7/1 for  s/p  successful right CEA    Currently in Pain? No/denies    Pain Onset More than a month ago           Treatment; Therapeutic exercise: [employer] x 5 mins L 3  Leg press x 20 x 3 sets    Neuromuscular Re-education Modified tandem on foam and trunk rotation x 20 One LE on 6 inch stool , one on foam and trunk rotation x 20 with theraball  Heel/toe raises without UE support 3s hold x 10 each 1/2 foam roll balance with flat side up 30sx 2 reps 1/2 foam roll balance with flat side down 30sx 2 reps 1/2 foam roll tandem balance alternating forward LE 30s x 2 eachLE forward Lateral side steps from foam to 6 inch stool left and right x 15     Patient performed with instruction, verbal cues, tactile cues of therapist: goal: increase tissue extensibility, promote proper posture, improve mobility                       PT Education - 10/17/19 1026    Education Details HEP    Person(s) Educated Patient    Methods Explanation    Comprehension Verbalized understanding            PT Short Term Goals - 09/14/19 1055  PT SHORT TERM GOAL #1   Title Patient will increase lower extremity functional scale to >60/80 to demonstrate improved functional mobility and increased tolerance with ADLs.    Baseline 09/14/19= 43/80    Time 6    Period Weeks    Status New    Target Date 11/02/19      PT SHORT TERM GOAL #2   Title Patient will be modified independent in walking on even/uneven surface with least restrictive assistive device, for 20+ minutes without rest break, reporting some difficulty or less to improve walking tolerance with community ambulation including grocery shopping, going to church,etc.    Baseline Patient can ambulate for 10 minutes on level surfaces without incline and without AD    Time 6    Period Weeks    Status New    Target Date 11/02/19             PT Long Term Goals - 09/14/19 1057      PT LONG TERM GOAL #1   Title Patient will  increase BLE gross strength to 4+/5 as to improve functional strength for independent gait, increased standing tolerance and increased ADL ability.    Baseline LLE 3/5 hip abd, 3+/5 hip flex, knee flex/ext 4/5, ankle 4/5    Time 12    Period Weeks    Status New    Target Date 12/07/19      PT LONG TERM GOAL #2   Title Patient will increase Berg Balance score by > 6 points to demonstrate decreased fall risk during functional activities.    Baseline 09/14/19= 42/56    Time 12    Period Weeks    Status New    Target Date 12/07/19      PT LONG TERM GOAL #3   Title Patient will ascend/descend 4 stairs without rail assist independently without loss of balance to improve ability to get in/out of home.    Baseline 09/14/19= needs UE rail support and no AD    Time 12    Period Weeks    Status New    Target Date 12/07/19      PT LONG TERM GOAL #4   Title Patient will be independent in home exercise program to improve strength/mobility for better functional independence with ADLs.    Time 12    Period Weeks    Status New    Target Date 12/07/19                 Plan - 10/17/19 1026    Clinical Impression Statement  Patient instructed in advanced dynamic and static balance exercise. Utilized stable and uneven surfaces to further challenge dynamic balance. Patient required min Vc for correct positioning and exercise technique. Patient had a difficult time during narrow base of support challenges.  He exhibits better SLS ability being able to progress to foam; Patient would benefit from additional skilled PT Intervention to improve strength, balance and gait safety   Personal Factors and Comorbidities Age;Comorbidity 1    Comorbidities HTN    Examination-Activity Limitations Locomotion Level;Stand    Examination-Participation Restrictions Driving;Yard Work;Meal Prep    Stability/Clinical Decision Making Stable/Uncomplicated    Rehab Potential Good    PT Frequency 2x / week    PT  Duration 12 weeks    PT Treatment/Interventions Patient/family education;Balance training;Neuromuscular re-education;Therapeutic exercise;Stair training    PT Next Visit Plan balance and strengthening    PT Home Exercise Plan [place] Access Code: [email or web address]  Consulted and Agree with Plan of Care Patient           Patient will benefit from skilled therapeutic intervention in order to improve the following deficits and impairments:  Decreased activity tolerance, Decreased strength, Difficulty walking, Decreased balance, Decreased coordination, Decreased mobility, Decreased endurance  Visit Diagnosis: Muscle weakness (generalized)  Other lack of coordination  Hemiparesis affecting left side as late effect of stroke [facility])  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 Carotid stenosis, right 08/04/2019  . Carotid stenosis 07/29/2019  . Carotid artery plaque, right 07/19/2019  . Hx of completed stroke 07/19/2019  . AKI (acute kidney injury) ([place])   . Hemiparesis affecting left side as late effect of stroke ([place])   . Decreased GFR   . Dyslipidemia   . Stroke ([place]) 07/06/2019  . TIA (transient ischemic attack) 07/05/2019  . Hypokalemia 07/05/2019  . GERD without esophagitis 06/18/2017  . Essential hypertension 08/19/2016  . Vertigo 12/05/2015  . Adenomatous colon polyp 01/31/2014  . Basal cell carcinoma 09/23/2013  . Hay fever 09/23/2013  . Vitamin D deficiency 09/23/2013  . Gastric catarrh 11/28/2011  . Hyperlipidemia, mixed 11/28/2011  . Preop cardiovascular exam 11/28/2011    Alanson Puls,, PT DPT 10/17/2019, 10:28 AM  [place] MAIN [facility] SERVICES [address] [place], [place], [address] Phone: [phone]   Fax:  [phone]  Name: BRACY PEPPER MRN: [id number] Date of Birth: March 12, 1945

## 2019-10-19 ENCOUNTER — Ambulatory Visit: Payer: Medicare Other | Admitting: Physical Therapy

## 2019-10-19 ENCOUNTER — Encounter: Payer: Medicare Other | Admitting: Occupational Therapy

## 2019-10-20 ENCOUNTER — Ambulatory Visit: Payer: Medicare Other | Admitting: Occupational Therapy

## 2019-10-20 ENCOUNTER — Encounter: Payer: Self-pay | Admitting: Occupational Therapy

## 2019-10-20 ENCOUNTER — Other Ambulatory Visit: Payer: Self-pay

## 2019-10-20 DIAGNOSIS — M6281 Muscle weakness (generalized): Secondary | ICD-10-CM | POA: Diagnosis not present

## 2019-10-20 DIAGNOSIS — R278 Other lack of coordination: Secondary | ICD-10-CM

## 2019-10-20 NOTE — Therapy (Signed)
[place] MAIN [facility] SERVICES [address] [place], [place], [address] Phone: [phone]   Fax:  [phone]  Occupational Therapy Treatment  Patient Details  Name: Russell Herman MRN: [id number] Date of Birth: [birth year] Referring Provider (OT): Dr. Letta Pate   Encounter Date: 10/20/2019   OT End of Session - 10/20/19 1248    Visit Number 6    Number of Visits 24    Date for OT Re-Evaluation 12/07/19    OT Start Time 1151    OT Stop Time 1230    OT Time Calculation (min) 39 min    Activity Tolerance Patient tolerated treatment well    Behavior During Therapy [facility] for tasks assessed/performed           Past Medical History:  Diagnosis Date  . Adenomatous colon polyp   . Allergy   . Arthritis   . Cancer ([place])   . Coronary artery disease   . GERD (gastroesophageal reflux disease)   . Hypertension   . Wears hearing aid in both ears     Past Surgical History:  Procedure Laterality Date  . cyst removed    . ENDARTERECTOMY Right 08/04/2019   Procedure: ENDARTERECTOMY CAROTID;  Surgeon: Algernon Huxley, MD;  Location: [facility];  Service: Vascular;  Laterality: Right;  . EXCISION OF TONGUE LESION Bilateral 05/05/2019   Procedure: EXCISION OF DORSAL TONGUE LESION;  Surgeon: Margaretha Sheffield, MD;  Location: [place];  Service: ENT;  Laterality: Bilateral;  . skin cancer removed      There were no vitals filed for this visit.   Subjective Assessment - 10/20/19 1247    Subjective  Pt. reports that he has been doing well.    Patient is accompanied by: Family member    Pertinent History Pt. is a 74 y.o. male who was hospitalized with a CVA on June 1st, 2021. Imaging revealed a small acute infarct in right lateral thalamus, and mild small vessel disease. Pt. received inpatient rehab services, and home health services. Pt. underwent  a Right Carotid Endartectomy on 08/04/2019. Pt. resides with his wife, and has supportive family. Pt. is a  retired Administrator for [employer], and enjoys gardening.    Currently in Pain? No/denies          OT TREATMENT   Therapeutic Exercise:  Pt.performed BUE therapeutic.exscapular protraction in supine with the shoulder flexed to 90 degrees, and the elbow extended. Pt. performed shoulder flexion, extension, abduction, horizontal abduction, retraction, internal, and external rotation.  Manual Therapy:  Pt.tolerated scapular mobilizations in sidelying, and sitting with ROM  Pt. responded well totreatment today. Reviewed positioning of the LUE with the scapula protracted while in supine pt. Presented with increased tightness in the scapular region. Pt. presented with decreased pain following there. Ex, and manual techniques. Pt. presented with tightness at the scapula initially, which improved following mobilizations. Pt.continues to need work on improving and normalizing tone, improving strength, and [place] skills in his left hand. Pt.continues to work on improving LUE strength, motor control, and [facility] skills in order to improve LUE functioning during ADLS, and IADLs, and maximize overall independence.                       OT Education - 10/20/19 1248    Education Details OT services, goal, POC.    Person(s) Educated Patient    Methods Explanation    Comprehension Verbalized understanding;Need further instruction  OT Long Term Goals - 09/14/19 1649      OT LONG TERM GOAL #1   Title Pt. will improve LUE strength by 1 muscle grade to assist with gardening, and yardwork.    Baseline Eval: 4/5 shoulder flexion, abduction. Pt. is unable to garden, and perform yardwork    Time 12    Period Weeks    Status New    Target Date 12/07/19      OT LONG TERM GOAL #2   Title Pt. will improve FOTO score by 2 points to maximize maximize independence with ADLs, and IADL functioning.    Baseline Intake score: 59    Time 12    Period Weeks    Status New     Target Date 12/07/19      OT LONG TERM GOAL #3   Title Pt will improve Left hand [facility] skills in order to be able to manipulate small objects    Baseline Eval: Left hand [facility] skills are limited. pt. has difficulty manipulating small objects efficiently during ADLs, and IADLs.    Time 12    Period Weeks    Status New    Target Date 12/07/19      OT LONG TERM GOAL #4   Title Pt. will imprive left hand motor control skills to be able to independently brush his hair with his LUE.    Baseline Eval: Pt. has difficulty    Time 12    Period Weeks    Status New    Target Date 12/07/19      OT LONG TERM GOAL #5   Title Pt. will use his left hand to be able to independently hold, and use a washcloth    Baseline Eval: Pt. has difficulty    Time 12    Period Weeks    Status New    Target Date 12/07/19                 Plan - 10/20/19 1249    Clinical Impression Statement . responded well totreatment today. Reviewed positioning of the LUE with the scapula protracted while in supine. Pt. presented with decreased pain following there. Ex, and manual techniques. Pt. presented with tightness at the scapula initially, which improved following  Pt.continues to need work on improving and normalizing tone, improving strength, and [place] skills in his left hand. Pt.continues to work on improving LUE strength, motor control, and [facility] skills in order to improve LUE functioning during ADLS, and IADLs, and maximize overall independence.   OT Occupational Profile and History Problem Focused Assessment - Including review of records relating to presenting problem    Occupational performance deficits (Please refer to evaluation for details): ADL's;IADL's    Body Structure / Function / Physical Skills ADL;IADL;Balance;Proprioception;Strength;Coordination;UE functional use;FMC;Dexterity    Rehab Potential Good    Clinical Decision Making Several treatment options, min-mod task modification necessary     Comorbidities Affecting Occupational Performance: May have comorbidities impacting occupational performance    Modification or Assistance to Complete Evaluation  Min-Moderate modification of tasks or assist with assess necessary to complete eval    OT Frequency 2x / week    OT Duration 12 weeks    OT Treatment/Interventions Self-care/ADL training;Therapeutic exercise;DME and/or AE instruction;Patient/family education;Therapeutic activities;Energy conservation;Neuromuscular education    Consulted and Agree with Plan of Care Patient           Patient will benefit from skilled therapeutic intervention in order to improve the following deficits and impairments:  Body Structure / Function / Physical Skills: ADL, IADL, Balance, Proprioception, Strength, Coordination, UE functional use, FMC, Dexterity       Visit Diagnosis: Muscle weakness (generalized)  Other lack of coordination    Problem List Patient Active Problem List   Diagnosis Date Noted  . Carotid stenosis, right 08/04/2019  . Carotid stenosis 07/29/2019  . Carotid artery plaque, right 07/19/2019  . Hx of completed stroke 07/19/2019  . AKI (acute kidney injury) ([place])   . Hemiparesis affecting left side as late effect of stroke ([place])   . Decreased GFR   . Dyslipidemia   . Stroke ([place]) 07/06/2019  . TIA (transient ischemic attack) 07/05/2019  . Hypokalemia 07/05/2019  . GERD without esophagitis 06/18/2017  . Essential hypertension 08/19/2016  . Vertigo 12/05/2015  . Adenomatous colon polyp 01/31/2014  . Basal cell carcinoma 09/23/2013  . Hay fever 09/23/2013  . Vitamin D deficiency 09/23/2013  . Gastric catarrh 11/28/2011  . Hyperlipidemia, mixed 11/28/2011  . Preop cardiovascular exam 11/28/2011    Harrel Carina, MS, OTR/L 10/20/2019, 12:51 PM  [place] MAIN [facility] SERVICES [address] [place], [place], [address] Phone: [phone]   Fax:  [phone]  Name:  Russell Herman MRN: [id number] Date of Birth: 1946-01-19

## 2019-10-24 ENCOUNTER — Encounter: Payer: Medicare Other | Admitting: Occupational Therapy

## 2019-10-24 ENCOUNTER — Ambulatory Visit: Payer: Medicare Other | Admitting: Physical Therapy

## 2019-10-25 DIAGNOSIS — M25511 Pain in right shoulder: Secondary | ICD-10-CM | POA: Insufficient documentation

## 2019-10-25 DIAGNOSIS — R531 Weakness: Secondary | ICD-10-CM | POA: Insufficient documentation

## 2019-10-26 ENCOUNTER — Encounter: Payer: Medicare Other | Admitting: Occupational Therapy

## 2019-10-26 ENCOUNTER — Ambulatory Visit: Payer: Medicare Other | Admitting: Physical Therapy

## 2019-10-27 ENCOUNTER — Ambulatory Visit: Payer: Medicare Other | Admitting: Occupational Therapy

## 2019-10-27 ENCOUNTER — Encounter: Payer: Self-pay | Admitting: Occupational Therapy

## 2019-10-27 ENCOUNTER — Other Ambulatory Visit: Payer: Self-pay

## 2019-10-27 ENCOUNTER — Ambulatory Visit: Payer: Medicare Other

## 2019-10-27 DIAGNOSIS — M6281 Muscle weakness (generalized): Secondary | ICD-10-CM

## 2019-10-27 DIAGNOSIS — R278 Other lack of coordination: Secondary | ICD-10-CM

## 2019-10-27 NOTE — Therapy (Signed)
[place] MAIN [facility] SERVICES [address] [place], [place], [address] Phone: [phone]   Fax:  [phone]  Physical Therapy Treatment/Progress Note  Dates of reporting period  09/14/19   to   10/27/19  Patient Details  Name: Russell Herman MRN: [id number] Date of Birth: 1945-03-28 No data recorded  Encounter Date: 10/27/2019   PT End of Session - 10/27/19 1106    Visit Number 10    Number of Visits 17    Date for PT Re-Evaluation 12/13/19    PT Start Time 1100    PT Stop Time 1144    PT Time Calculation (min) 44 min    Equipment Utilized During Treatment Gait belt    Activity Tolerance Patient tolerated treatment well    Behavior During Therapy WFL for tasks assessed/performed           Past Medical History:  Diagnosis Date  . Adenomatous colon polyp   . Allergy   . Arthritis   . Cancer ([place])   . Coronary artery disease   . GERD (gastroesophageal reflux disease)   . Hypertension   . Wears hearing aid in both ears     Past Surgical History:  Procedure Laterality Date  . cyst removed    . ENDARTERECTOMY Right 08/04/2019   Procedure: ENDARTERECTOMY CAROTID;  Surgeon: Algernon Huxley, MD;  Location: [facility];  Service: Vascular;  Laterality: Right;  . EXCISION OF TONGUE LESION Bilateral 05/05/2019   Procedure: EXCISION OF DORSAL TONGUE LESION;  Surgeon: Margaretha Sheffield, MD;  Location: [place];  Service: ENT;  Laterality: Bilateral;  . skin cancer removed      There were no vitals filed for this visit.   Subjective Assessment - 10/27/19 1105    Subjective Patient reports doing well today however his L shoulder continues to be bothersome. He currently rates his shoulder pain as 4/10. Reports doing HEP for LE strengthening with good tolerance.  No specific questions or concerns at this time    Pertinent History Right lateral thalamic infarct mild ataxia and mild hemiparesis. Patient went to rehab 07/07/19-07/15/19. Then he had  surgery 7/1 for  s/p successful right CEA    Currently in Pain? Yes    Pain Score 4     Pain Location Shoulder    Pain Orientation Left    Pain Descriptors / Indicators Aching    Pain Type Chronic pain    Pain Onset More than a month ago    Pain Frequency Constant              [facility] PT Assessment - 10/27/19 1132      Standardized Balance Assessment   Standardized Balance Assessment Berg Balance Test;Dynamic Gait Index      Berg Balance Test   Sit to Stand Able to stand without using hands and stabilize independently    Standing Unsupported Able to stand safely 2 minutes    Sitting with Back Unsupported but Feet Supported on Floor or Stool Able to sit safely and securely 2 minutes    Stand to Sit Sits safely with minimal use of hands    Transfers Able to transfer safely, minor use of hands    Standing Unsupported with Eyes Closed Able to stand 10 seconds safely    Standing Unsupported with Feet Together Able to place feet together independently and stand 1 minute safely    From Standing, Reach Forward with Outstretched Arm Can reach confidently >25 cm (10")  From Standing Position, Pick up Object from [place] to pick up shoe safely and easily    From Standing Position, Turn to Look Behind Over each Shoulder Looks behind from both sides and weight shifts well    Turn 360 Degrees Able to turn 360 degrees safely but slowly    Standing Unsupported, Alternately Place Feet on Step/Stool Able to stand independently and safely and complete 8 steps in 20 seconds    Standing Unsupported, One Foot in Front Able to plae foot ahead of the other independently and hold 30 seconds    Standing on One Leg Able to lift leg independently and hold equal to or more than 3 seconds    Total Score 51      Dynamic Gait Index   Level Surface Mild Impairment    Change in Gait Speed Normal    Gait with Horizontal Head Turns Normal    Gait with Vertical Head Turns Normal    Gait and Pivot Turn Normal     Step Over Obstacle Normal    Step Around Obstacles Normal    Steps Mild Impairment    Total Score 22              TREATMENT   Ther-ex  Octane xRide L2-4 x 5 minutes for warm-up during history (4 minutes unbilled); Pt completed LEFS (49/80) and FOTO (69); Precor BLE leg press 70# x 20, 85# 2 x 20;  MMT LLE: 3+/5 hip abd, 4/5 hip flex, 5/5 knee extension, 4+/5 knee flexion, 4+/5 ankle DF  Sit to stand from regular height chair, no upper extremity support 2x10;   Neuromuscular Re-education  Performed BERG (51/56) and DGI (22/24) with patient;   Pt educated throughout session about proper posture and technique with exercises. Improved exercise technique, movement at target joints, use of target muscles after min to mod verbal, visual, tactile cues.    Patient demonstrates excellent motivation throughout physical therapy session.  Updated outcome measures and goals with patient on this date. Pt demonstrates improvement in his LEFS from 43/80 at initial evaluation to 49/80 today. He completed a FOTO questionnaire today which was 96. He demonstrates improvement in his LLE strength for hip flexion, hip abduction, knee extension, knee flexion, and ankle dorsiflexion. BERG has improved considerably from 42/56 initially to 51/56 today. Performed DGI with patient today who demonstrated good dynamic balance scoring 22/24. He has met a large portion of his goals but still has not achieved maximal benefit from therapy. Pt will benefit from PT services to address deficits in strength, balance, and mobility in order to return to full function at home.                            PT Short Term Goals - 10/27/19 1110      PT SHORT TERM GOAL #1   Title Patient will increase lower extremity functional scale to >60/80 to demonstrate improved functional mobility and increased tolerance with ADLs.    Baseline 09/14/19= 43/80; 10/27/19: 49/80    Time 6    Period Weeks    Status  Partially Met    Target Date 11/02/19      PT SHORT TERM GOAL #2   Title Patient will be modified independent in walking on even/uneven surface with least restrictive assistive device, for 20+ minutes without rest break, reporting some difficulty or less to improve walking tolerance with community ambulation including grocery shopping, going to church,etc.  Baseline Patient can ambulate for 10 minutes on level surfaces without incline and without AD, 10/27/19: Pt reports he can ambulate for 25 minutes;    Time 6    Period Weeks    Status Achieved    Target Date 11/02/19             PT Long Term Goals - 10/27/19 1112      PT LONG TERM GOAL #1   Title Patient will increase BLE gross strength to 4+/5 as to improve functional strength for independent gait, increased standing tolerance and increased ADL ability.    Baseline LLE 3/5 hip abd, 3+/5 hip flex, knee flex/ext 4/5, ankle 4/5; 10/27/19: LLE: 3+/5 hip abd, 4/5 hip flex, 5/5 knee extension, 4+/5 knee flexion, 4+/5 ankle DF    Time 12    Period Weeks    Status Partially Met    Target Date 12/07/19      PT LONG TERM GOAL #2   Title Patient will increase Berg Balance score by > 6 points to demonstrate decreased fall risk during functional activities.    Baseline 09/14/19= 42/56; 10/27/19: 51/56    Time 12    Period Weeks    Status Achieved      PT LONG TERM GOAL #3   Title Patient will ascend/descend 4 stairs without rail assist independently without loss of balance to improve ability to get in/out of home.    Baseline 09/14/19= needs UE rail support and no AD; 10/27/19: needs single UE support with reciprocal pattern    Time 12    Period Weeks    Status Partially Met    Target Date 12/07/19      PT LONG TERM GOAL #4   Title Patient will be independent in home exercise program to improve strength/mobility for better functional independence with ADLs.    Time 12    Period Weeks    Status On-going    Target Date 12/07/19                  Plan - 10/27/19 1338    Clinical Impression Statement Patient demonstrates excellent motivation throughout physical therapy session.  Updated outcome measures and goals with patient on this date. Pt demonstrates improvement in his LEFS from 43/80 at initial evaluation to 49/80 today. He completed a FOTO questionnaire today which was 82. He demonstrates improvement in his LLE strength for hip flexion, hip abduction, knee extension, knee flexion, and ankle dorsiflexion. BERG has improved considerably from 42/56 initially to 51/56 today. Performed DGI with patient today who demonstrated good dynamic balance scoring 22/24. He has met a large portion of his goals but still has not achieved maximal benefit from therapy. Pt will benefit from PT services to address deficits in strength, balance, and mobility in order to return to full function at home.    Personal Factors and Comorbidities Age;Comorbidity 1    Comorbidities HTN    Examination-Activity Limitations Locomotion Level;Stand    Examination-Participation Restrictions Driving;Yard Work;Meal Prep    Stability/Clinical Decision Making Stable/Uncomplicated    Rehab Potential Good    PT Frequency 2x / week    PT Duration 12 weeks    PT Treatment/Interventions Patient/family education;Balance training;Neuromuscular re-education;Therapeutic exercise;Stair training    PT Next Visit Plan balance and strengthening    PT Home Exercise Plan Medbridge Access Code: [email or web address]    Consulted and Agree with Plan of Care Patient           Patient will benefit  from skilled therapeutic intervention in order to improve the following deficits and impairments:  Decreased activity tolerance, Decreased strength, Difficulty walking, Decreased balance, Decreased coordination, Decreased mobility, Decreased endurance  Visit Diagnosis: Muscle weakness (generalized)  Other lack of coordination     Problem List Patient Active Problem List    Diagnosis Date Noted  . Carotid stenosis, right 08/04/2019  . Carotid stenosis 07/29/2019  . Carotid artery plaque, right 07/19/2019  . Hx of completed stroke 07/19/2019  . AKI (acute kidney injury) ([place])   . Hemiparesis affecting left side as late effect of stroke ([place])   . Decreased GFR   . Dyslipidemia   . Stroke ([place]) 07/06/2019  . TIA (transient ischemic attack) 07/05/2019  . Hypokalemia 07/05/2019  . GERD without esophagitis 06/18/2017  . Essential hypertension 08/19/2016  . Vertigo 12/05/2015  . Adenomatous colon polyp 01/31/2014  . Basal cell carcinoma 09/23/2013  . Hay fever 09/23/2013  . Vitamin D deficiency 09/23/2013  . Gastric catarrh 11/28/2011  . Hyperlipidemia, mixed 11/28/2011  . Preop cardiovascular exam 11/28/2011   Phillips Grout PT, DPT, GCS  Dorise Gangi 10/27/2019, 1:54 PM  [place] MAIN [facility] SERVICES [address] [place], [place], [address] Phone: [phone]   Fax:  [phone]  Name: Russell Herman MRN: [id number] Date of Birth: 05-23-1945

## 2019-10-27 NOTE — Therapy (Signed)
[place] MAIN [facility] SERVICES [address] [place], [place], [address] Phone: [phone]   Fax:  [phone]  Occupational Therapy Treatment  Patient Details  Name: Russell Herman MRN: [id number] Date of Birth: 22-Dec-1945 Referring Provider (OT): Dr. Letta Pate   Encounter Date: 10/27/2019   OT End of Session - 10/27/19 1244    Visit Number 7    Number of Visits 24    Date for OT Re-Evaluation 12/07/19    OT Start Time 1145    OT Stop Time 1230    OT Time Calculation (min) 45 min    Behavior During Therapy [facility] for tasks assessed/performed           Past Medical History:  Diagnosis Date  . Adenomatous colon polyp   . Allergy   . Arthritis   . Cancer ([place])   . Coronary artery disease   . GERD (gastroesophageal reflux disease)   . Hypertension   . Wears hearing aid in both ears     Past Surgical History:  Procedure Laterality Date  . cyst removed    . ENDARTERECTOMY Right 08/04/2019   Procedure: ENDARTERECTOMY CAROTID;  Surgeon: Algernon Huxley, MD;  Location: [facility];  Service: Vascular;  Laterality: Right;  . EXCISION OF TONGUE LESION Bilateral 05/05/2019   Procedure: EXCISION OF DORSAL TONGUE LESION;  Surgeon: Margaretha Sheffield, MD;  Location: [place];  Service: ENT;  Laterality: Bilateral;  . skin cancer removed      There were no vitals filed for this visit.   Subjective Assessment - 10/27/19 1241    Subjective  Pt. reports doing well today.    Patient is accompanied by: Family member    Pertinent History Pt. is a 74 y.o. male who was hospitalized with a CVA on June 1st, 2021. Imaging revealed a small acute infarct in right lateral thalamus, and mild small vessel disease. Pt. received inpatient rehab services, and home health services. Pt. underwent  a Right Carotid Endartectomy on 08/04/2019. Pt. resides with his wife, and has supportive family. Pt. is a retired Administrator for [employer], and enjoys gardening.    Currently  in Pain? Yes    Pain Score 4     Pain Location Shoulder    Pain Orientation Left    Pain Descriptors / Indicators Aching;Tingling          OT TREATMENT   Therapeutic Exercise:  Pt.performed BUE therapeutic.exscapular protraction in supine with the shoulder flexed to 90 degrees, and the elbow extended. Pt. performed shoulder flexion, extension, abduction, horizontal abduction, retraction, internal, and external rotation. Pt. Tolerated the UBE for 10 min. (67min. No resistance, 5 min. Minimal resistance). Pt. performed gross gripping with grip strengthener. Pt. worked on sustaining left grip while grasping pegs and reaching at various heights. The gripper was set to 17.9# of force. Pt. worked on left grip strength using [employer] digital dynamometer: 50.8#, 51.8#, 51.2#, 52.6#  Neuro muscular re-education:  Pt. worked on left [facility] skills grasping, and manipulating resistive tweezers using Chief Strategy Officer. Pt. worked on grasping the thin pegs from a horizontal position with the resitsive tweezers  Manual Therapy:  Pt.tolerated scapular mobilizations in sidelying, and sitting with ROM. Pt. tolerated STM to the scapular thoracic region AP grade 2. Pt. Tolerated it well with no reports of discomfort.  Pt. Reports that he had a neurology appointment with Dr. Melrose Nakayama this week, and has started on an anti-inflammatory. Pt reports that he may be  referred for an orthopedic consult. Pt. responded well totreatment today. Pt. presents with less pain today in the left shoulder. Pt. presented with tightness at the scapula initially, which improved following manual therapy. Pt.continues to need work on improving and normalizing tone, improving strength, and improving [facility] skills in his left hand. Pt.continues to work on improving LUE strength, motor control, and [facility] skills in order to improve LUE functioning during ADLS, and IADLs, and maximize overall  independence.                         OT Education - 10/27/19 1243    Education Details OT services, goal, POC.    Person(s) Educated Patient    Methods Explanation    Comprehension Verbalized understanding;Need further instruction               OT Long Term Goals - 09/14/19 1649      OT LONG TERM GOAL #1   Title Pt. will improve LUE strength by 1 muscle grade to assist with gardening, and yardwork.    Baseline Eval: 4/5 shoulder flexion, abduction. Pt. is unable to garden, and perform yardwork    Time 12    Period Weeks    Status New    Target Date 12/07/19      OT LONG TERM GOAL #2   Title Pt. will improve FOTO score by 2 points to maximize maximize independence with ADLs, and IADL functioning.    Baseline Intake score: 59    Time 12    Period Weeks    Status New    Target Date 12/07/19      OT LONG TERM GOAL #3   Title Pt will improve Left hand [facility] skills in order to be able to manipulate small objects    Baseline Eval: Left hand [facility] skills are limited. pt. has difficulty manipulating small objects efficiently during ADLs, and IADLs.    Time 12    Period Weeks    Status New    Target Date 12/07/19      OT LONG TERM GOAL #4   Title Pt. will imprive left hand motor control skills to be able to independently brush his hair with his LUE.    Baseline Eval: Pt. has difficulty    Time 12    Period Weeks    Status New    Target Date 12/07/19      OT LONG TERM GOAL #5   Title Pt. will use his left hand to be able to independently hold, and use a washcloth    Baseline Eval: Pt. has difficulty    Time 12    Period Weeks    Status New    Target Date 12/07/19                 Plan - 10/27/19 1245    Clinical Impression Statement Pt. Reports that he had a neurology appointment with Dr. Melrose Nakayama this week, and has started on an anti-inflammatory. Pt reports that he may be referred for an orthopedic consult. Pt. responded well totreatment  today. Pt. presents with less pain today in the left shoulder. Pt. presented with tightness at the scapula initially, which improved following manual therapy. Pt.continues to need work on improving and normalizing tone, improving strength, and improving [facility] skills in his left hand. Pt.continues to work on improving LUE strength, motor control, and [facility] skills in order to improve LUE functioning during ADLS, and IADLs, and maximize  overall independence.   OT Occupational Profile and History Problem Focused Assessment - Including review of records relating to presenting problem    Occupational performance deficits (Please refer to evaluation for details): ADL's;IADL's    Body Structure / Function / Physical Skills ADL;IADL;Balance;Proprioception;Strength;Coordination;UE functional use;FMC;Dexterity    Rehab Potential Good    Clinical Decision Making Several treatment options, min-mod task modification necessary    Comorbidities Affecting Occupational Performance: May have comorbidities impacting occupational performance    Modification or Assistance to Complete Evaluation  Min-Moderate modification of tasks or assist with assess necessary to complete eval    OT Frequency 2x / week    OT Duration 12 weeks    OT Treatment/Interventions Self-care/ADL training;Therapeutic exercise;DME and/or AE instruction;Patient/family education;Therapeutic activities;Energy conservation;Neuromuscular education    Consulted and Agree with Plan of Care Patient           Patient will benefit from skilled therapeutic intervention in order to improve the following deficits and impairments:   Body Structure / Function / Physical Skills: ADL, IADL, Balance, Proprioception, Strength, Coordination, UE functional use, FMC, Dexterity       Visit Diagnosis: Muscle weakness (generalized)  Other lack of coordination    Problem List Patient Active Problem List   Diagnosis Date Noted  . Carotid stenosis, right  08/04/2019  . Carotid stenosis 07/29/2019  . Carotid artery plaque, right 07/19/2019  . Hx of completed stroke 07/19/2019  . AKI (acute kidney injury) ([place])   . Hemiparesis affecting left side as late effect of stroke ([place])   . Decreased GFR   . Dyslipidemia   . Stroke ([place]) 07/06/2019  . TIA (transient ischemic attack) 07/05/2019  . Hypokalemia 07/05/2019  . GERD without esophagitis 06/18/2017  . Essential hypertension 08/19/2016  . Vertigo 12/05/2015  . Adenomatous colon polyp 01/31/2014  . Basal cell carcinoma 09/23/2013  . Hay fever 09/23/2013  . Vitamin D deficiency 09/23/2013  . Gastric catarrh 11/28/2011  . Hyperlipidemia, mixed 11/28/2011  . Preop cardiovascular exam 11/28/2011    Harrel Carina, MS, OTR/L 10/27/2019, 12:49 PM  [place] MAIN [facility] SERVICES [address] [place], [place], [address] Phone: [phone]   Fax:  [phone]  Name: Russell Herman MRN: [id number] Date of Birth: August 25, 1945

## 2019-10-31 ENCOUNTER — Ambulatory Visit: Payer: Medicare Other | Admitting: Occupational Therapy

## 2019-10-31 ENCOUNTER — Other Ambulatory Visit: Payer: Self-pay

## 2019-10-31 ENCOUNTER — Encounter: Payer: Self-pay | Admitting: Physical Therapy

## 2019-10-31 ENCOUNTER — Ambulatory Visit: Payer: Medicare Other | Admitting: Physical Therapy

## 2019-10-31 DIAGNOSIS — M6281 Muscle weakness (generalized): Secondary | ICD-10-CM

## 2019-10-31 DIAGNOSIS — R278 Other lack of coordination: Secondary | ICD-10-CM

## 2019-10-31 DIAGNOSIS — I69354 Hemiplegia and hemiparesis following cerebral infarction affecting left non-dominant side: Secondary | ICD-10-CM

## 2019-10-31 DIAGNOSIS — I6309 Cerebral infarction due to thrombosis of other precerebral artery: Secondary | ICD-10-CM

## 2019-10-31 NOTE — Therapy (Signed)
[place] MAIN [facility] SERVICES [address] [place], [place], [address] Phone: [phone]   Fax:  [phone]  Physical Therapy Treatment  Patient Details  Name: Russell Herman MRN: [id number] Date of Birth: [birth year] No data recorded  Encounter Date: 10/31/2019   PT End of Session - 10/31/19 1116    Visit Number 11    Number of Visits 17    Date for PT Re-Evaluation 12/13/19    PT Start Time 1100    PT Stop Time 1142    PT Time Calculation (min) 42 min    Equipment Utilized During Treatment Gait belt    Activity Tolerance Patient tolerated treatment well    Behavior During Therapy WFL for tasks assessed/performed           Past Medical History:  Diagnosis Date   Adenomatous colon polyp    Allergy    Arthritis    Cancer ([place])    Coronary artery disease    GERD (gastroesophageal reflux disease)    Hypertension    Wears hearing aid in both ears     Past Surgical History:  Procedure Laterality Date   cyst removed     ENDARTERECTOMY Right 08/04/2019   Procedure: ENDARTERECTOMY CAROTID;  Surgeon: Algernon Huxley, MD;  Location: [facility];  Service: Vascular;  Laterality: Right;   EXCISION OF TONGUE LESION Bilateral 05/05/2019   Procedure: EXCISION OF DORSAL TONGUE LESION;  Surgeon: Margaretha Sheffield, MD;  Location: [place];  Service: ENT;  Laterality: Bilateral;   skin cancer removed      There were no vitals filed for this visit.   Subjective Assessment - 10/31/19 1115    Subjective Patient reports doing well today however his L shoulder continues to be bothersome. He currently rates his shoulder pain as 4/10. Reports doing HEP for LE strengthening with good tolerance.  No specific questions or concerns at this time    Pertinent History Right lateral thalamic infarct mild ataxia and mild hemiparesis. Patient went to rehab 07/07/19-07/15/19. Then he had surgery 7/1 for  s/p successful right CEA    Currently in Pain?  Yes    Pain Score 4     Pain Location Shoulder    Pain Orientation Left    Pain Descriptors / Indicators Aching    Pain Onset More than a month ago           Neuromuscular Re-education  Rocker board fwd/bwd, side to side x 20 each direction Tandem gait on level surfaces without UE support x 2 lengths Side stepping on level surfaces without UE support x 2 lengths Heel/toe raises without UE support 3s hold x 10 each 1/2 foam roll balance with flat side up 30s x 2 reps 1/2 foam roll balance with flat side down 30s x 2 reps 1/2 foam roll tandem balance alternating forward LE 30s x 2 each LE forward Lateral side steps from foam to 6 inch stool left and right x 15 Backwards stepping from foam to 6 inch stool x 15  Four Square fwd/bwd, side to side , diagonal x 10 ,cues for posture and stepping strategies, occasional LOB       Pt educated throughout session about proper posture and technique with exercises. Improved exercise technique, movement at target joints, use of target muscles after min to mod verbal, visual, tactile cues. CGA and Min to mod verbal cues used throughout with increased in postural sway and LOB most seen with narrow base  of support and while on uneven surfaces. Continues to have balance deficits typical with diagnosis. Patient performs intermediate level exercises without pain behaviors and needs verbal cuing for postural alignment and head positioning Tactile cues and assistance needed to keep lower leg and knee in neutral to avoid compensations with ankle motions.;                           PT Education - 10/31/19 1115    Education Details HEP    Person(s) Educated Patient    Methods Explanation    Comprehension Verbalized understanding            PT Short Term Goals - 10/27/19 1110      PT SHORT TERM GOAL #1   Title Patient will increase lower extremity functional scale to >60/80 to demonstrate improved functional mobility and  increased tolerance with ADLs.    Baseline 09/14/19= 43/80; 10/27/19: 49/80    Time 6    Period Weeks    Status Partially Met    Target Date 11/02/19      PT SHORT TERM GOAL #2   Title Patient will be modified independent in walking on even/uneven surface with least restrictive assistive device, for 20+ minutes without rest break, reporting some difficulty or less to improve walking tolerance with community ambulation including grocery shopping, going to church,etc.    Baseline Patient can ambulate for 10 minutes on level surfaces without incline and without AD, 10/27/19: Pt reports he can ambulate for 25 minutes;    Time 6    Period Weeks    Status Achieved    Target Date 11/02/19             PT Long Term Goals - 10/27/19 1112      PT LONG TERM GOAL #1   Title Patient will increase BLE gross strength to 4+/5 as to improve functional strength for independent gait, increased standing tolerance and increased ADL ability.    Baseline LLE 3/5 hip abd, 3+/5 hip flex, knee flex/ext 4/5, ankle 4/5; 10/27/19: LLE: 3+/5 hip abd, 4/5 hip flex, 5/5 knee extension, 4+/5 knee flexion, 4+/5 ankle DF    Time 12    Period Weeks    Status Partially Met    Target Date 12/07/19      PT LONG TERM GOAL #2   Title Patient will increase Berg Balance score by > 6 points to demonstrate decreased fall risk during functional activities.    Baseline 09/14/19= 42/56; 10/27/19: 51/56    Time 12    Period Weeks    Status Achieved      PT LONG TERM GOAL #3   Title Patient will ascend/descend 4 stairs without rail assist independently without loss of balance to improve ability to get in/out of home.    Baseline 09/14/19= needs UE rail support and no AD; 10/27/19: needs single UE support with reciprocal pattern    Time 12    Period Weeks    Status Partially Met    Target Date 12/07/19      PT LONG TERM GOAL #4   Title Patient will be independent in home exercise program to improve strength/mobility for better  functional independence with ADLs.    Time 12    Period Weeks    Status On-going    Target Date 12/07/19                 Plan - 10/31/19 1116  Clinical Impression Statement Patient instructed in intermediate strengthening and balance exercise.  Patient requires min Vcs for correct exercise technique including to improve LE control with standing exercise. Patient demonstrates better ankle  control with SLS tasks with rail assist. Patient would benefit from additional skilled PT intervention to improve balance/gait safety and reduce fall risk.   Personal Factors and Comorbidities Age;Comorbidity 1    Comorbidities HTN    Examination-Activity Limitations Locomotion Level;Stand    Examination-Participation Restrictions Driving;Yard Work;Meal Prep    Stability/Clinical Decision Making Stable/Uncomplicated    Rehab Potential Good    PT Frequency 2x / week    PT Duration 12 weeks    PT Treatment/Interventions Patient/family education;Balance training;Neuromuscular re-education;Therapeutic exercise;Stair training    PT Next Visit Plan balance and strengthening    PT Home Exercise Plan Medbridge Access Code: [email or web address]    Consulted and Agree with Plan of Care Patient           Patient will benefit from skilled therapeutic intervention in order to improve the following deficits and impairments:  Decreased activity tolerance, Decreased strength, Difficulty walking, Decreased balance, Decreased coordination, Decreased mobility, Decreased endurance  Visit Diagnosis: Muscle weakness (generalized)  Other lack of coordination  Hemiparesis affecting left side as late effect of stroke (HCC)  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 [facility])     Problem List Patient Active Problem List   Diagnosis Date Noted   Carotid stenosis, right 08/04/2019   Carotid stenosis 07/29/2019   Carotid artery plaque, right 07/19/2019   Hx of completed stroke  07/19/2019   AKI (acute kidney injury) ([place])    Hemiparesis affecting left side as late effect of stroke ([place])    Decreased GFR    Dyslipidemia    Stroke ([place]) 07/06/2019   TIA (transient ischemic attack) 07/05/2019   Hypokalemia 07/05/2019   GERD without esophagitis 06/18/2017   Essential hypertension 08/19/2016   Vertigo 12/05/2015   Adenomatous colon polyp 01/31/2014   Basal cell carcinoma 09/23/2013   Hay fever 09/23/2013   Vitamin D deficiency 09/23/2013   Gastric catarrh 11/28/2011   Hyperlipidemia, mixed 11/28/2011   Preop cardiovascular exam 11/28/2011    Alanson Puls, PT DPT 10/31/2019, 11:17 AM  [place] [facility] MAIN [facility] SERVICES [address] [place], [place], [address] Phone: [phone]   Fax:  [phone]  Name: Russell Herman MRN: [id number] Date of Birth: 1946-01-09

## 2019-11-02 ENCOUNTER — Encounter: Payer: Medicare Other | Admitting: Occupational Therapy

## 2019-11-02 ENCOUNTER — Ambulatory Visit: Payer: Medicare Other | Admitting: Physical Therapy

## 2019-11-02 ENCOUNTER — Encounter: Payer: Self-pay | Admitting: Occupational Therapy

## 2019-11-02 NOTE — Therapy (Signed)
[place] MAIN [facility] SERVICES [address] [place], [place], [address] Phone: [phone]   Fax:  [phone]  Occupational Therapy Treatment  Patient Details  Name: Russell Herman MRN: [id number] Date of Birth: 10/31/1945 Referring Provider (OT): Dr. Letta Pate   Encounter Date: 10/31/2019   OT End of Session - 11/02/19 2054    Visit Number 8    Number of Visits 24    Date for OT Re-Evaluation 12/07/19    OT Start Time 1015    OT Stop Time 1100    OT Time Calculation (min) 45 min    Activity Tolerance Patient tolerated treatment well    Behavior During Therapy [facility] for tasks assessed/performed           Past Medical History:  Diagnosis Date  . Adenomatous colon polyp   . Allergy   . Arthritis   . Cancer ([place])   . Coronary artery disease   . GERD (gastroesophageal reflux disease)   . Hypertension   . Wears hearing aid in both ears     Past Surgical History:  Procedure Laterality Date  . cyst removed    . ENDARTERECTOMY Right 08/04/2019   Procedure: ENDARTERECTOMY CAROTID;  Surgeon: Algernon Huxley, MD;  Location: [facility];  Service: Vascular;  Laterality: Right;  . EXCISION OF TONGUE LESION Bilateral 05/05/2019   Procedure: EXCISION OF DORSAL TONGUE LESION;  Surgeon: Margaretha Sheffield, MD;  Location: [place];  Service: ENT;  Laterality: Bilateral;  . skin cancer removed      There were no vitals filed for this visit.   Subjective Assessment - 11/02/19 2052    Subjective  No complaints, doing well and has been improving with tasks at home    Pertinent History Pt. is a 74 y.o. male who was hospitalized with a CVA on June 1st, 2021. Imaging revealed a small acute infarct in right lateral thalamus, and mild small vessel disease. Pt. received inpatient rehab services, and home health services. Pt. underwent  a Right Carotid Endartectomy on 08/04/2019. Pt. resides with his wife, and has supportive family. Pt. is a retired Dietitian for [employer], and enjoys gardening.    Patient Stated Goals To be as independent as possible.    Currently in Pain? Yes    Pain Score 4     Pain Location Shoulder    Pain Orientation Left    Pain Descriptors / Indicators Aching    Pain Onset More than a month ago    Pain Frequency Constant    Multiple Pain Sites No           Patient seen for joint mobilizations left shoulder posterior and inferior glides at glenohumeral joint, grade II to decrease pain and increase motion.  Performed in supine.  Patient independent with mat transfers.   Patient seen for left shoulder stabilization exercises in supine, shoulder flexion for place and hold at 90 degrees, added external perturbations for 15 count each, small circles forwards and backwards with shoulder at 90 degrees of flexion, elbow extension overhead.  2# dowel for shoulder flexion, ABD, ADD, chest press, forwards and backwards circles.  Cues for proper form and technique.   Reaching patterns in sitting at the edge of mat.    Coordination skills with left hand with manipulation of 1/2 inch sized objects, cues for prehension patterns.    Response to tx:  Patient continues to progress in all areas, still has some shoulder pain which limits  full participation at times in daily activities. Patient's pain more with tasks requiring ABD of the left arm, decreased pain with shoulder flexion.  Remains limited with manipulation skills of left hand but is improving with therapy sessions.  Continue to work towards goals in plan of care to maximize safety and independence in daily tasks.                       OT Education - 11/02/19 2053    Education Details shoulder stabilization exercises, ROM, coordination    Person(s) Educated Patient    Methods Explanation;Demonstration    Comprehension Verbalized understanding;Need further instruction;Returned demonstration;Verbal cues required               OT Long Term Goals -  09/14/19 1649      OT LONG TERM GOAL #1   Title Pt. will improve LUE strength by 1 muscle grade to assist with gardening, and yardwork.    Baseline Eval: 4/5 shoulder flexion, abduction. Pt. is unable to garden, and perform yardwork    Time 12    Period Weeks    Status New    Target Date 12/07/19      OT LONG TERM GOAL #2   Title Pt. will improve FOTO score by 2 points to maximize maximize independence with ADLs, and IADL functioning.    Baseline Intake score: 59    Time 12    Period Weeks    Status New    Target Date 12/07/19      OT LONG TERM GOAL #3   Title Pt will improve Left hand [facility] skills in order to be able to manipulate small objects    Baseline Eval: Left hand [facility] skills are limited. pt. has difficulty manipulating small objects efficiently during ADLs, and IADLs.    Time 12    Period Weeks    Status New    Target Date 12/07/19      OT LONG TERM GOAL #4   Title Pt. will imprive left hand motor control skills to be able to independently brush his hair with his LUE.    Baseline Eval: Pt. has difficulty    Time 12    Period Weeks    Status New    Target Date 12/07/19      OT LONG TERM GOAL #5   Title Pt. will use his left hand to be able to independently hold, and use a washcloth    Baseline Eval: Pt. has difficulty    Time 12    Period Weeks    Status New    Target Date 12/07/19                 Plan - 11/02/19 2054    Clinical Impression Statement Patient continues to progress in all areas, still has some shoulder pain which limits full participation at times in daily activities. Patient's pain more with tasks requiring ABD of the left arm, decreased pain with shoulder flexion.  Remains limited with manipulation skills of left hand but is improving with therapy sessions.  Continue to work towards goals in plan of care to maximize safety and independence in daily tasks.    OT Occupational Profile and History Problem Focused Assessment - Including review of  records relating to presenting problem    Occupational performance deficits (Please refer to evaluation for details): ADL's;IADL's    Body Structure / Function / Physical Skills ADL;IADL;Balance;Proprioception;Strength;Coordination;UE functional use;FMC;Dexterity    Rehab Potential  Good    Clinical Decision Making Several treatment options, min-mod task modification necessary    Comorbidities Affecting Occupational Performance: May have comorbidities impacting occupational performance    Modification or Assistance to Complete Evaluation  Min-Moderate modification of tasks or assist with assess necessary to complete eval    OT Frequency 2x / week    OT Duration 12 weeks    OT Treatment/Interventions Self-care/ADL training;Therapeutic exercise;DME and/or AE instruction;Patient/family education;Therapeutic activities;Energy conservation;Neuromuscular education    Consulted and Agree with Plan of Care Patient           Patient will benefit from skilled therapeutic intervention in order to improve the following deficits and impairments:   Body Structure / Function / Physical Skills: ADL, IADL, Balance, Proprioception, Strength, Coordination, UE functional use, FMC, Dexterity       Visit Diagnosis: Muscle weakness (generalized)  Other lack of coordination  Hemiparesis affecting left side as late effect of stroke [facility])    Problem List Patient Active Problem List   Diagnosis Date Noted  . Carotid stenosis, right 08/04/2019  . Carotid stenosis 07/29/2019  . Carotid artery plaque, right 07/19/2019  . Hx of completed stroke 07/19/2019  . AKI (acute kidney injury) ([place])   . Hemiparesis affecting left side as late effect of stroke ([place])   . Decreased GFR   . Dyslipidemia   . Stroke ([place]) 07/06/2019  . TIA (transient ischemic attack) 07/05/2019  . Hypokalemia 07/05/2019  . GERD without esophagitis 06/18/2017  . Essential hypertension 08/19/2016  . Vertigo 12/05/2015  . Adenomatous  colon polyp 01/31/2014  . Basal cell carcinoma 09/23/2013  . Hay fever 09/23/2013  . Vitamin D deficiency 09/23/2013  . Gastric catarrh 11/28/2011  . Hyperlipidemia, mixed 11/28/2011  . Preop cardiovascular exam 11/28/2011   Achilles Dunk, OTR/L, CLT  Adellyn Capek 11/02/2019, 9:13 PM  [place] MAIN [facility] SERVICES [address] [place], [place], [address] Phone: [phone]   Fax:  [phone]  Name: Russell Herman MRN: [id number] Date of Birth: [birth year]

## 2019-11-03 ENCOUNTER — Other Ambulatory Visit: Payer: Self-pay

## 2019-11-03 ENCOUNTER — Ambulatory Visit: Payer: Medicare Other | Admitting: Occupational Therapy

## 2019-11-03 ENCOUNTER — Encounter: Payer: Self-pay | Admitting: Occupational Therapy

## 2019-11-03 ENCOUNTER — Ambulatory Visit: Payer: Medicare Other

## 2019-11-03 ENCOUNTER — Ambulatory Visit: Payer: Medicare Other | Admitting: Physical Therapy

## 2019-11-03 DIAGNOSIS — M6281 Muscle weakness (generalized): Secondary | ICD-10-CM | POA: Diagnosis not present

## 2019-11-03 DIAGNOSIS — R278 Other lack of coordination: Secondary | ICD-10-CM

## 2019-11-03 NOTE — Therapy (Signed)
[place] MAIN [facility] SERVICES [address] [place], [place], [address] Phone: [phone]   Fax:  [phone]  Occupational Therapy Treatment  Patient Details  Name: Russell Herman MRN: [id number] Date of Birth: February 29, 1945 Referring Provider (OT): Dr. Letta Pate   Encounter Date: 11/03/2019   OT End of Session - 11/03/19 1152    Visit Number 9    Number of Visits 24    Date for OT Re-Evaluation 12/07/19    OT Start Time 1100    OT Stop Time 1145    OT Time Calculation (min) 45 min    Activity Tolerance Patient tolerated treatment well    Behavior During Therapy [facility] for tasks assessed/performed           Past Medical History:  Diagnosis Date  . Adenomatous colon polyp   . Allergy   . Arthritis   . Cancer ([place])   . Coronary artery disease   . GERD (gastroesophageal reflux disease)   . Hypertension   . Wears hearing aid in both ears     Past Surgical History:  Procedure Laterality Date  . cyst removed    . ENDARTERECTOMY Right 08/04/2019   Procedure: ENDARTERECTOMY CAROTID;  Surgeon: Algernon Huxley, MD;  Location: [facility];  Service: Vascular;  Laterality: Right;  . EXCISION OF TONGUE LESION Bilateral 05/05/2019   Procedure: EXCISION OF DORSAL TONGUE LESION;  Surgeon: Margaretha Sheffield, MD;  Location: [place];  Service: ENT;  Laterality: Bilateral;  . skin cancer removed      There were no vitals filed for this visit.   Subjective Assessment - 11/03/19 1151    Subjective  Pt. reports that he am is feeling better.    Patient is accompanied by: Family member    Pertinent History Pt. is a 74 y.o. male who was hospitalized with a CVA on June 1st, 2021. Imaging revealed a small acute infarct in right lateral thalamus, and mild small vessel disease. Pt. received inpatient rehab services, and home health services. Pt. underwent  a Right Carotid Endartectomy on 08/04/2019. Pt. resides with his wife, and has supportive family. Pt. is a  retired Administrator for [employer], and enjoys gardening.    Currently in Pain? Yes    Pain Score 2     Pain Location Shoulder    Pain Orientation Left    Pain Descriptors / Indicators Tightness          OT TREATMENT   Therapeutic Exercise:  Pt. Performed stretches for trunk rotation to the left, and right. Pt.performed BUE therapeutic.exscapular protraction in supine with the shoulder flexed to 90 degrees, and the elbow extended.Pt. performed shoulder flexion, extension, abduction, horizontal abduction, retraction, internal, and external rotation. Pt. Performed retraction with red theraband. Pt. Tolerated the UBE for 10 min.   Manual Therapy  Pt.tolerated scapular mobilizations insidelying, and sitting with ROM. Pt. tolerated STM to the scapular thoracic region AP grade 2. Inferior medial STM to the posterior, lateral left thoracic, scapular musculature. Pt. tolerated AP, and inferior glides at the left  [facility] joint to prepare for ROM Pt. Pt. Tolerated manual therapy well. Manual therapy was performed independent of and in preparation for there. Ex,  Pt. presents with less pain today in the left shoulder reporting 2/10 pain.Pt. presented with tightness at the scapula initially, which improved following manual therapy.Pt.continues to need work on improving and normalizing tone, improving strength, and improving [facility] skills in his left hand. Pt.continues to work on  improving LUE strength, motor control, and [facility] skills in order to improve LUE functioning during ADLS, and IADLs, and maximize overall independence.                       OT Education - 11/03/19 1152    Education Details shoulder stabilization exercises, ROM, coordination    Person(s) Educated Patient    Methods Explanation;Demonstration    Comprehension Verbalized understanding;Need further instruction;Returned demonstration;Verbal cues required               OT Long Term Goals - 09/14/19 1649       OT LONG TERM GOAL #1   Title Pt. will improve LUE strength by 1 muscle grade to assist with gardening, and yardwork.    Baseline Eval: 4/5 shoulder flexion, abduction. Pt. is unable to garden, and perform yardwork    Time 12    Period Weeks    Status New    Target Date 12/07/19      OT LONG TERM GOAL #2   Title Pt. will improve FOTO score by 2 points to maximize maximize independence with ADLs, and IADL functioning.    Baseline Intake score: 59    Time 12    Period Weeks    Status New    Target Date 12/07/19      OT LONG TERM GOAL #3   Title Pt will improve Left hand [facility] skills in order to be able to manipulate small objects    Baseline Eval: Left hand [facility] skills are limited. pt. has difficulty manipulating small objects efficiently during ADLs, and IADLs.    Time 12    Period Weeks    Status New    Target Date 12/07/19      OT LONG TERM GOAL #4   Title Pt. will imprive left hand motor control skills to be able to independently brush his hair with his LUE.    Baseline Eval: Pt. has difficulty    Time 12    Period Weeks    Status New    Target Date 12/07/19      OT LONG TERM GOAL #5   Title Pt. will use his left hand to be able to independently hold, and use a washcloth    Baseline Eval: Pt. has difficulty    Time 12    Period Weeks    Status New    Target Date 12/07/19                 Plan - 11/03/19 1154    Clinical Impression Statement Pt. presents with less pain today in the left shoulder reporting 2/10 pain.Pt. presented with tightness at the scapula initially, which improved following manual therapy.Pt.continues to need work on improving and normalizing tone, improving strength, and improving [facility] skills in his left hand. Pt.continues to work on improving LUE strength, motor control, and [facility] skills in order to improve LUE functioning during ADLS, and IADLs, and maximize overall independence.   OT Occupational Profile and History Problem Focused  Assessment - Including review of records relating to presenting problem    Occupational performance deficits (Please refer to evaluation for details): ADL's;IADL's    Body Structure / Function / Physical Skills ADL;IADL;Balance;Proprioception;Strength;Coordination;UE functional use;FMC;Dexterity    Rehab Potential Good    Clinical Decision Making Several treatment options, min-mod task modification necessary    Comorbidities Affecting Occupational Performance: May have comorbidities impacting occupational performance    Modification or Assistance to Complete Evaluation  Min-Moderate modification of tasks or assist with assess necessary to complete eval    OT Frequency 2x / week    OT Duration 12 weeks    OT Treatment/Interventions Self-care/ADL training;Therapeutic exercise;DME and/or AE instruction;Patient/family education;Therapeutic activities;Energy conservation;Neuromuscular education    Consulted and Agree with Plan of Care Patient           Patient will benefit from skilled therapeutic intervention in order to improve the following deficits and impairments:   Body Structure / Function / Physical Skills: ADL, IADL, Balance, Proprioception, Strength, Coordination, UE functional use, FMC, Dexterity       Visit Diagnosis: Muscle weakness (generalized)    Problem List Patient Active Problem List   Diagnosis Date Noted  . Carotid stenosis, right 08/04/2019  . Carotid stenosis 07/29/2019  . Carotid artery plaque, right 07/19/2019  . Hx of completed stroke 07/19/2019  . AKI (acute kidney injury) ([place])   . Hemiparesis affecting left side as late effect of stroke ([place])   . Decreased GFR   . Dyslipidemia   . Stroke ([place]) 07/06/2019  . TIA (transient ischemic attack) 07/05/2019  . Hypokalemia 07/05/2019  . GERD without esophagitis 06/18/2017  . Essential hypertension 08/19/2016  . Vertigo 12/05/2015  . Adenomatous colon polyp 01/31/2014  . Basal cell carcinoma 09/23/2013  .  Hay fever 09/23/2013  . Vitamin D deficiency 09/23/2013  . Gastric catarrh 11/28/2011  . Hyperlipidemia, mixed 11/28/2011  . Preop cardiovascular exam 11/28/2011    Harrel Carina, MS, OTR/L 11/03/2019, 12:01 PM  [place] MAIN [facility] SERVICES [address] [place], [place], [address] Phone: [phone]   Fax:  [phone]  Name: Russell Herman MRN: [id number] Date of Birth: October 18, 1945

## 2019-11-03 NOTE — Therapy (Signed)
[place] MAIN [facility] SERVICES [address] [place], [place], [address] Phone: [phone]   Fax:  [phone]  Physical Therapy Treatment  Patient Details  Name: Russell Herman MRN: [id number] Date of Birth: Jan 13, 1946 No data recorded  Encounter Date: 11/03/2019   PT End of Session - 11/03/19 1134    Visit Number 12    Number of Visits 17    Date for PT Re-Evaluation 12/13/19    PT Start Time 1941    PT Stop Time 1230    PT Time Calculation (min) 45 min    Equipment Utilized During Treatment Gait belt    Activity Tolerance Patient tolerated treatment well    Behavior During Therapy WFL for tasks assessed/performed           Past Medical History:  Diagnosis Date  . Adenomatous colon polyp   . Allergy   . Arthritis   . Cancer ([place])   . Coronary artery disease   . GERD (gastroesophageal reflux disease)   . Hypertension   . Wears hearing aid in both ears     Past Surgical History:  Procedure Laterality Date  . cyst removed    . ENDARTERECTOMY Right 08/04/2019   Procedure: ENDARTERECTOMY CAROTID;  Surgeon: Algernon Huxley, MD;  Location: [facility];  Service: Vascular;  Laterality: Right;  . EXCISION OF TONGUE LESION Bilateral 05/05/2019   Procedure: EXCISION OF DORSAL TONGUE LESION;  Surgeon: Margaretha Sheffield, MD;  Location: [place];  Service: ENT;  Laterality: Bilateral;  . skin cancer removed      There were no vitals filed for this visit.   Subjective Assessment - 11/03/19 1133    Subjective Patient reports doing well today. His L shoulder continues to be bothersome but he does feel like it is slowly improving. He currently rates his shoulder pain as 3/10. Reports doing HEP for LE strengthening with good tolerance.  No specific questions or concerns at this time    Pertinent History Right lateral thalamic infarct mild ataxia and mild hemiparesis. Patient went to rehab 07/07/19-07/15/19. Then he had surgery 7/1 for  s/p  successful right CEA    Currently in Pain? Yes    Pain Score 3     Pain Location Shoulder    Pain Orientation Left    Pain Descriptors / Indicators Aching    Pain Type Chronic pain    Pain Onset More than a month ago    Pain Frequency Constant              TREATMENT   Ther-ex Nu-stepfitnessx 5 minsL2-4 during history taking (4 minutes unbilled) Leg press 70# x 20, 85# x 20, 100# x 20; Sit to stand from regular height chair with right lower extremity on 6 inch step with 3kg med ball overhead press 2 x 10;  Heel/Toe raises BUE support 3s hold x 20;  Seated hamstring step stretch BLE 2 x 30s; Calf stretching BLE with Prostretch 2 x 30s;   Neuromuscular Re-education 6" alternating step ups with Airex pad on top of step alternating leading LE x 10 each; 1/2 foam roll balance with flat side up x 30s; 1/2 foam roll heel/toe rockingh flat side up x 10 each; 1/2 foam roll tandem balance alternating forward LE 30s x 2 eachLE forward Dynadisc balance with one disc under each foot and no UE support x 30s; Dynadisc mini squats with one disc under each foot and no UE support x 10;  Pt educated throughout session about proper posture and technique with exercises. Improved exercise technique, movement at target joints, use of target muscles after min to mod verbal, visual, tactile cues.   Patient demonstrates excellent motivation throughout physical therapy session.  Continued with stretching, balance, and strengthening exercises during session today.  Increased resistance on the leg press today and pt is able to complete but with more fatigue. Incorporated dynadisc for increased difficulty today. Patient encouraged to continue HEP and follow-up as scheduled. Pt will benefit from PT services to address deficits in strength, balance, and mobility in order to return to full function at home.                            PT Short Term Goals - 10/27/19  1110      PT SHORT TERM GOAL #1   Title Patient will increase lower extremity functional scale to >60/80 to demonstrate improved functional mobility and increased tolerance with ADLs.    Baseline 09/14/19= 43/80; 10/27/19: 49/80    Time 6    Period Weeks    Status Partially Met    Target Date 11/02/19      PT SHORT TERM GOAL #2   Title Patient will be modified independent in walking on even/uneven surface with least restrictive assistive device, for 20+ minutes without rest break, reporting some difficulty or less to improve walking tolerance with community ambulation including grocery shopping, going to church,etc.    Baseline Patient can ambulate for 10 minutes on level surfaces without incline and without AD, 10/27/19: Pt reports he can ambulate for 25 minutes;    Time 6    Period Weeks    Status Achieved    Target Date 11/02/19             PT Long Term Goals - 10/27/19 1112      PT LONG TERM GOAL #1   Title Patient will increase BLE gross strength to 4+/5 as to improve functional strength for independent gait, increased standing tolerance and increased ADL ability.    Baseline LLE 3/5 hip abd, 3+/5 hip flex, knee flex/ext 4/5, ankle 4/5; 10/27/19: LLE: 3+/5 hip abd, 4/5 hip flex, 5/5 knee extension, 4+/5 knee flexion, 4+/5 ankle DF    Time 12    Period Weeks    Status Partially Met    Target Date 12/07/19      PT LONG TERM GOAL #2   Title Patient will increase Berg Balance score by > 6 points to demonstrate decreased fall risk during functional activities.    Baseline 09/14/19= 42/56; 10/27/19: 51/56    Time 12    Period Weeks    Status Achieved      PT LONG TERM GOAL #3   Title Patient will ascend/descend 4 stairs without rail assist independently without loss of balance to improve ability to get in/out of home.    Baseline 09/14/19= needs UE rail support and no AD; 10/27/19: needs single UE support with reciprocal pattern    Time 12    Period Weeks    Status Partially Met     Target Date 12/07/19      PT LONG TERM GOAL #4   Title Patient will be independent in home exercise program to improve strength/mobility for better functional independence with ADLs.    Time 12    Period Weeks    Status On-going    Target Date 12/07/19  Plan - 11/03/19 1135    Clinical Impression Statement Patient demonstrates excellent motivation throughout physical therapy session.  Continued with stretching, balance, and strengthening exercises during session today.  Increased resistance on the leg press today and pt is able to complete but with more fatigue. Incorporated dynadisc for increased difficulty today. Patient encouraged to continue HEP and follow-up as scheduled. Pt will benefit from PT services to address deficits in strength, balance, and mobility in order to return to full function at home.    Personal Factors and Comorbidities Age;Comorbidity 1    Comorbidities HTN    Examination-Activity Limitations Locomotion Level;Stand    Examination-Participation Restrictions Driving;Yard Work;Meal Prep    Stability/Clinical Decision Making Stable/Uncomplicated    Rehab Potential Good    PT Frequency 2x / week    PT Duration 12 weeks    PT Treatment/Interventions Patient/family education;Balance training;Neuromuscular re-education;Therapeutic exercise;Stair training    PT Next Visit Plan balance and strengthening    PT Home Exercise Plan Medbridge Access Code: [email or web address]    Consulted and Agree with Plan of Care Patient           Patient will benefit from skilled therapeutic intervention in order to improve the following deficits and impairments:  Decreased activity tolerance, Decreased strength, Difficulty walking, Decreased balance, Decreased coordination, Decreased mobility, Decreased endurance  Visit Diagnosis: Muscle weakness (generalized)  Other lack of coordination     Problem List Patient Active Problem List   Diagnosis Date Noted  .  Carotid stenosis, right 08/04/2019  . Carotid stenosis 07/29/2019  . Carotid artery plaque, right 07/19/2019  . Hx of completed stroke 07/19/2019  . AKI (acute kidney injury) ([place])   . 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 Decreased GFR   . Dyslipidemia   . Stroke ([place]) 07/06/2019  . TIA (transient ischemic attack) 07/05/2019  . Hypokalemia 07/05/2019  . GERD without esophagitis 06/18/2017  . Essential hypertension 08/19/2016  . Vertigo 12/05/2015  . Adenomatous colon polyp 01/31/2014  . Basal cell carcinoma 09/23/2013  . Hay fever 09/23/2013  . Vitamin D deficiency 09/23/2013  . Gastric catarrh 11/28/2011  . Hyperlipidemia, mixed 11/28/2011  . Preop cardiovascular exam 11/28/2011    Phillips Grout PT, DPT, GCS  Malea Swilling 11/03/2019, 1:50 PM  [place] MAIN [facility] SERVICES [address] [place], [place], [address] Phone: [phone]   Fax:  [phone]  Name: MARITZA GOLDSBOROUGH MRN: [id number] Date of Birth: April 05, 1945

## 2019-11-10 ENCOUNTER — Other Ambulatory Visit: Payer: Self-pay

## 2019-11-10 ENCOUNTER — Ambulatory Visit: Payer: Medicare Other | Attending: Physical Medicine & Rehabilitation

## 2019-11-10 ENCOUNTER — Encounter: Payer: Self-pay | Admitting: Occupational Therapy

## 2019-11-10 ENCOUNTER — Ambulatory Visit: Payer: Medicare Other | Admitting: Occupational Therapy

## 2019-11-10 DIAGNOSIS — R278 Other lack of coordination: Secondary | ICD-10-CM

## 2019-11-10 DIAGNOSIS — I69354 Hemiplegia and hemiparesis following cerebral infarction affecting left non-dominant side: Secondary | ICD-10-CM | POA: Insufficient documentation

## 2019-11-10 DIAGNOSIS — I6309 Cerebral infarction due to thrombosis of other precerebral artery: Secondary | ICD-10-CM | POA: Diagnosis present

## 2019-11-10 DIAGNOSIS — M6281 Muscle weakness (generalized): Secondary | ICD-10-CM | POA: Diagnosis present

## 2019-11-10 NOTE — Therapy (Signed)
[place] MAIN [facility] SERVICES [address] [place], [place], [address] Phone: [phone]   Fax:  [phone]  Occupational Therapy Progress Note  Dates of reporting period  09/14/2019  to  11/10/2019  apy TreatmentPatient Details  Name: Russell Herman MRN: [id number] Date of Birth: [birth year] Referring Provider (OT): Dr. Letta Pate   Encounter Date: 11/10/2019   OT End of Session - 11/10/19 1702    Visit Number 10    Number of Visits 24    Date for OT Re-Evaluation 12/07/19    OT Start Time 1145    OT Stop Time 1230    OT Time Calculation (min) 45 min    Activity Tolerance Patient tolerated treatment well    Behavior During Therapy [facility] for tasks assessed/performed           Past Medical History:  Diagnosis Date  . Adenomatous colon polyp   . Allergy   . Arthritis   . Cancer ([place])   . Coronary artery disease   . GERD (gastroesophageal reflux disease)   . Hypertension   . Wears hearing aid in both ears     Past Surgical History:  Procedure Laterality Date  . cyst removed    . ENDARTERECTOMY Right 08/04/2019   Procedure: ENDARTERECTOMY CAROTID;  Surgeon: Algernon Huxley, MD;  Location: [facility] ORS;  Service: Vascular;  Laterality: Right;  . EXCISION OF TONGUE LESION Bilateral 05/05/2019   Procedure: EXCISION OF DORSAL TONGUE LESION;  Surgeon: Margaretha Sheffield, MD;  Location: [place];  Service: ENT;  Laterality: Bilateral;  . skin cancer removed      There were no vitals filed for this visit.   Subjective Assessment - 11/10/19 1701    Subjective  Pt. reports that he had a bad night last night    Patient is accompanied by: Family member    Pertinent History Pt. is a 74 y.o. male who was hospitalized with a CVA on June 1st, 2021. Imaging revealed a small acute infarct in right lateral thalamus, and mild small vessel disease. Pt. received inpatient rehab services, and home health services. Pt. underwent  a Right Carotid  Endartectomy on 08/04/2019. Pt. resides with his wife, and has supportive family. Pt. is a retired Administrator for [employer], and enjoys gardening.    Currently in Pain? Yes    Pain Location Shoulder    Pain Orientation Left    Pain Descriptors / Indicators Aching    Pain Onset More than a month ago         OT TREATMENT   Therapeutic Exercise:  Pt.performed BUE therapeutic.exscapular protraction in supine with the shoulder flexed to 90 degrees, and the elbow extended.Pt. performed shoulder flexion, extension, abduction, horizontal abduction, retraction, internal, and external rotation.Pt. performed retraction with red theraband. Pt. Tolerated the UBE for 10 min. Pt. performed gross gripping with grip strengthener. Pt. worked on sustaining grip while grasping pegs and reaching at various heights. The gripper was set at 17.9#.  Pt. Worked on gross gripping using the [employer] Dynamometer 40.6#, 40.8#, 40.6#, 40.6#  Manual Therapy  Pt.tolerated scapular mobilizations insidelying, and sitting with ROM. Pt. tolerated STM to the scapular musculature. Pt. Tolerated manual therapy well. Manual therapy was performed independent of and in preparation for there. Ex.  Neuromuscular re-education:  Pt. worked on left hand [facility] skills grasping 1" sticks, 1/4" collars, and 1/4" washers. Pt. worked on storing the objects in the palm, and translatory skills moving the items from  the palm of the hand to the tip of the 2nd digit, and thumb. Pt. worked on removing the pegs using bilateral alternating hand patterns.   Pt. reports that he had a bad night last night with a headache, dizziness, and that his blood pressure was elevated with his systolic reading over 782, and his diastolic over 423. Pt. Reports that he has a nurse visit at his MD's office this afternoon. Pt.'s BP 146/85.  Left shoulder pain was improved today at 2/10. Pt. continues to present with increased tightness proximally.  Pt.   responded well to the treatment session with increased AROM following treatment.  Pt. Continues to work on improving, and normalizing tone in the LUE, promoting active isolated movement proximally,  improving left hand motor control, and [facility] skills in order to improve, and maximize independence with ADLs, and IADL tasks.                     OT Education - 11/10/19 1702    Education Details shoulder stabilization exercises, ROM, coordination    Person(s) Educated Patient    Methods Explanation;Demonstration    Comprehension Verbalized understanding;Need further instruction;Returned demonstration;Verbal cues required               OT Long Term Goals - 11/10/19 1703      OT LONG TERM GOAL #1   Title Pt. will improve LUE strength by 1 muscle grade to assist with gardening, and yardwork.    Baseline 4/5 shoulder flexion, abduction. Pt. has difficulty performing yardwork    Time 12    Period Weeks    Status On-going    Target Date 12/07/19      OT LONG TERM GOAL #2   Title Pt. will improve FOTO score by 2 points to maximize maximize independence with ADLs, and IADL functioning.    Baseline FOTO score: 59    Time 12    Period Weeks    Status On-going    Target Date 12/07/19      OT LONG TERM GOAL #3   Title Pt will improve Left hand [facility] skills in order to be able to manipulate small objects    Baseline Left hand [facility] skills conitnue to be limited. pt. has difficulty manipulating small objects efficiently during ADLs, and IADLs.    Time 12    Period Weeks    Status On-going    Target Date 12/07/19      OT LONG TERM GOAL #4   Title Pt. will imprive left hand motor control skills to be able to independently brush his hair with his LUE.    Baseline Pt. conitnues to have difficulty    Time 12    Period Weeks    Status On-going    Target Date 12/07/19      OT LONG TERM GOAL #5   Title Pt. will use his left hand to be able to independently hold, and use a  washcloth    Baseline Eval: Pt. has difficulty    Time 12    Period Weeks    Status On-going    Target Date 12/07/19                 Plan - 11/10/19 1703    Clinical Impression Statement Pt. reports that he had a bad night last night with a headache, dizziness, and that his blood pressure was elevated with his systolic reading over 536, and his diastolic over 144. Pt. Reports that  he has a nurse visit at his MD's office this afternoon. Pt.'s BP 146/85.  Left shoulder pain was improved today at 2/10. Pt. continues to present with increased tightness proximally.  Pt.  responded well to the treatment session with increased AROM following treatment.  Pt. Continues to work on improving, and normalizing tone in the LUE, promoting active isolated movement proximally,  improving left hand motor control, and [facility] skills in order to improve, and maximize independence with ADLs, and IADL tasks.   OT Occupational Profile and History Problem Focused Assessment - Including review of records relating to presenting problem    Occupational performance deficits (Please refer to evaluation for details): ADL's;IADL's    Body Structure / Function / Physical Skills ADL;IADL;Balance;Proprioception;Strength;Coordination;UE functional use;FMC;Dexterity    Rehab Potential Good    Clinical Decision Making Several treatment options, min-mod task modification necessary    Comorbidities Affecting Occupational Performance: May have comorbidities impacting occupational performance    Modification or Assistance to Complete Evaluation  Min-Moderate modification of tasks or assist with assess necessary to complete eval    OT Frequency 2x / week    OT Duration 12 weeks    OT Treatment/Interventions Self-care/ADL training;Therapeutic exercise;DME and/or AE instruction;Patient/family education;Therapeutic activities;Energy conservation;Neuromuscular education    Consulted and Agree with Plan of Care Patient            Patient will benefit from skilled therapeutic intervention in order to improve the following deficits and impairments:   Body Structure / Function / Physical Skills: ADL, IADL, Balance, Proprioception, Strength, Coordination, UE functional use, FMC, Dexterity       Visit Diagnosis: Muscle weakness (generalized)  Other lack of coordination    Problem List Patient Active Problem List   Diagnosis Date Noted  . Carotid stenosis, right 08/04/2019  . Carotid stenosis 07/29/2019  . Carotid artery plaque, right 07/19/2019  . Hx of completed stroke 07/19/2019  . AKI (acute kidney injury) ([place])   . Hemiparesis affecting left side as late effect of stroke ([place])   . Decreased GFR   . Dyslipidemia   . Stroke ([place]) 07/06/2019  . TIA (transient ischemic attack) 07/05/2019  . Hypokalemia 07/05/2019  . GERD without esophagitis 06/18/2017  . Essential hypertension 08/19/2016  . Vertigo 12/05/2015  . Adenomatous colon polyp 01/31/2014  . Basal cell carcinoma 09/23/2013  . Hay fever 09/23/2013  . Vitamin D deficiency 09/23/2013  . Gastric catarrh 11/28/2011  . Hyperlipidemia, mixed 11/28/2011  . Preop cardiovascular exam 11/28/2011    Harrel Carina, MS, OTR/L 11/10/2019, 5:08 PM  [place] MAIN [facility] SERVICES [address] [place], [place], [address] Phone: [phone]   Fax:  [phone]  Name: Russell Herman MRN: [id number] Date of Birth: [birth year]

## 2019-11-10 NOTE — Therapy (Signed)
[place] MAIN [facility] SERVICES [address] [place], [place], [address] Phone: [phone]   Fax:  [phone]  Physical Therapy Treatment  Patient Details  Name: Russell Herman MRN: [id number] Date of Birth: Nov 30, 1945 No data recorded  Encounter Date: 11/10/2019   PT End of Session - 11/10/19 1116    Visit Number 13    Number of Visits 17    Date for PT Re-Evaluation 12/13/19    PT Start Time 1105    PT Stop Time 1145    PT Time Calculation (min) 40 min    Equipment Utilized During Treatment Gait belt    Activity Tolerance Patient tolerated treatment well;No increased pain    Behavior During Therapy WFL for tasks assessed/performed           Past Medical History:  Diagnosis Date  . Adenomatous colon polyp   . Allergy   . Arthritis   . Cancer ([place])   . Coronary artery disease   . GERD (gastroesophageal reflux disease)   . Hypertension   . Wears hearing aid in both ears     Past Surgical History:  Procedure Laterality Date  . cyst removed    . ENDARTERECTOMY Right 08/04/2019   Procedure: ENDARTERECTOMY CAROTID;  Surgeon: Algernon Huxley, MD;  Location: [facility];  Service: Vascular;  Laterality: Right;  . EXCISION OF TONGUE LESION Bilateral 05/05/2019   Procedure: EXCISION OF DORSAL TONGUE LESION;  Surgeon: Margaretha Sheffield, MD;  Location: [place];  Service: ENT;  Laterality: Bilateral;  . skin cancer removed      There were no vitals filed for this visit.   Subjective Assessment - 11/10/19 1109    Subjective Pt reports a difficult night sleeping last 3 nights, pt having HTN after dinner, 200s/100s mmHg, 128/24mHg at beginning of session. Pt being seen in Dr. PSaralyn Pilaroffice later this date.    Pertinent History Right lateral thalamic infarct mild ataxia and mild hemiparesis. Patient went to rehab 07/07/19-07/15/19. Then he had surgery 7/1 for  s/p successful right CEA    Currently in Pain? Yes    Pain Score 4     Pain  Location --   Left hip, Left shoulder.         INTERVENTION:  -Heel/Toe raises BUE support 3s hold x 20; -Sit to stand from regular height chair with right lower extremity on 6 inch step,+ overhead press 1x10 (3lb RUE, 1lb LUE) -Seated hamstring step stretch BLE 2 x 30s; -6" alternating step ups with Airex pad on top of step alternating leading LE x 10 each; -Dynadisc balance with one disc under each foot: 2 minutes total, hands free, intermittent LOB recovery with RUE on rail -single dynadisc minisquat 1x10 each side, intermittent UE support prn, (funtional squat c BUE rainbow ball to 15" high target) -1/2 foam roll balance with flat side up x 60s; -1/2 foam roll heel/toe rockingh flat side up x60sec; -1/2 foam roll tandem balance alternating forward LE 30s x 2 each LE forward  *minguard assist c gait belt proivded for all balance activity       PT Short Term Goals - 10/27/19 1110      PT SHORT TERM GOAL #1   Title Patient will increase lower extremity functional scale to >60/80 to demonstrate improved functional mobility and increased tolerance with ADLs.    Baseline 09/14/19= 43/80; 10/27/19: 49/80    Time 6    Period Weeks    Status Partially  Met    Target Date 11/02/19      PT SHORT TERM GOAL #2   Title Patient will be modified independent in walking on even/uneven surface with least restrictive assistive device, for 20+ minutes without rest break, reporting some difficulty or less to improve walking tolerance with community ambulation including grocery shopping, going to church,etc.    Baseline Patient can ambulate for 10 minutes on level surfaces without incline and without AD, 10/27/19: Pt reports he can ambulate for 25 minutes;    Time 6    Period Weeks    Status Achieved    Target Date 11/02/19             PT Long Term Goals - 10/27/19 1112      PT LONG TERM GOAL #1   Title Patient will increase BLE gross strength to 4+/5 as to improve functional strength for  independent gait, increased standing tolerance and increased ADL ability.    Baseline LLE 3/5 hip abd, 3+/5 hip flex, knee flex/ext 4/5, ankle 4/5; 10/27/19: LLE: 3+/5 hip abd, 4/5 hip flex, 5/5 knee extension, 4+/5 knee flexion, 4+/5 ankle DF    Time 12    Period Weeks    Status Partially Met    Target Date 12/07/19      PT LONG TERM GOAL #2   Title Patient will increase Berg Balance score by > 6 points to demonstrate decreased fall risk during functional activities.    Baseline 09/14/19= 42/56; 10/27/19: 51/56    Time 12    Period Weeks    Status Achieved      PT LONG TERM GOAL #3   Title Patient will ascend/descend 4 stairs without rail assist independently without loss of balance to improve ability to get in/out of home.    Baseline 09/14/19= needs UE rail support and no AD; 10/27/19: needs single UE support with reciprocal pattern    Time 12    Period Weeks    Status Partially Met    Target Date 12/07/19      PT LONG TERM GOAL #4   Title Patient will be independent in home exercise program to improve strength/mobility for better functional independence with ADLs.    Time 12    Period Weeks    Status On-going    Target Date 12/07/19                 Plan - 11/10/19 1118    Clinical Impression Statement Pt educated throughout session about proper posture and technique with exercises. Improved exercise technique, movement at target joints, use of target muscles after min to mod verbal, visual, tactile cues. Patient demonstrates excellent motivation throughout physical therapy session. Rests provided as needed after exertion. Continued with stretching, balance, and strengthening exercises during session today. Pt is able to complete but with more fatigue. Inc[employer] dynadisc for increased difficulty today. Patient encouraged to continue HEP and follow-up as scheduled. Pt will benefit from PT services to address deficits in strength, balance, and mobility in order to return to full  function at home.    Personal Factors and Comorbidities Age;Comorbidity 1    Comorbidities HTN    Examination-Activity Limitations Locomotion Level;Stand    Examination-Participation Restrictions Driving;Yard Work;Meal Prep    Stability/Clinical Decision Making Stable/Uncomplicated    Clinical Decision Making Low    Rehab Potential Good    PT Frequency 2x / week    PT Duration 12 weeks    PT Treatment/Interventions Patient/family education;Balance training;Neuromuscular re-education;Therapeutic  exercise;Stair training    PT Next Visit Plan balance and strengthening    PT Home Exercise Plan Medbridge Access Code: [email or web address]           Patient will benefit from skilled therapeutic intervention in order to improve the following deficits and impairments:  Decreased activity tolerance, Decreased strength, Difficulty walking, Decreased balance, Decreased coordination, Decreased mobility, Decreased endurance  Visit Diagnosis: Muscle weakness (generalized)  Other lack of coordination  Hemiparesis affecting left side as late effect of stroke [facility])  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 Carotid stenosis, right 08/04/2019  . Carotid stenosis 07/29/2019  . Carotid artery plaque, right 07/19/2019  . Hx of completed stroke 07/19/2019  . AKI (acute kidney injury) ([place])   . Hemiparesis affecting left side as late effect of stroke ([place])   . Decreased GFR   . Dyslipidemia   . Stroke ([place]) 07/06/2019  . TIA (transient ischemic attack) 07/05/2019  . Hypokalemia 07/05/2019  . GERD without esophagitis 06/18/2017  . Essential hypertension 08/19/2016  . Vertigo 12/05/2015  . Adenomatous colon polyp 01/31/2014  . Basal cell carcinoma 09/23/2013  . Hay fever 09/23/2013  . Vitamin D deficiency 09/23/2013  . Gastric catarrh 11/28/2011  . Hyperlipidemia, mixed 11/28/2011  . Preop  cardiovascular exam 11/28/2011   11:47 AM, 11/10/19 Etta Grandchild, PT, DPT Physical Therapist - [facility]  Outpatient Physical Therapy- [place] [phone]     Etta Grandchild 11/10/2019, 11:26 AM  [place] MAIN [facility] SERVICES [address] [place], [place], [address] Phone: [phone]   Fax:  [phone]  Name: Russell Herman MRN: [id number] Date of Birth: August 03, 1945

## 2019-11-14 ENCOUNTER — Other Ambulatory Visit: Payer: Self-pay

## 2019-11-14 ENCOUNTER — Encounter: Payer: Self-pay | Admitting: Occupational Therapy

## 2019-11-14 ENCOUNTER — Ambulatory Visit: Payer: Medicare Other | Admitting: Occupational Therapy

## 2019-11-14 ENCOUNTER — Ambulatory Visit: Payer: Medicare Other

## 2019-11-14 DIAGNOSIS — M6281 Muscle weakness (generalized): Secondary | ICD-10-CM

## 2019-11-14 DIAGNOSIS — R278 Other lack of coordination: Secondary | ICD-10-CM

## 2019-11-14 NOTE — Therapy (Signed)
[place] MAIN [facility] SERVICES [address] [place], [place], [address] Phone: [phone]   Fax:  [phone]  Physical Therapy Treatment  Patient Details  Name: Russell Herman MRN: [id number] Date of Birth: 1945/04/09 No data recorded  Encounter Date: 11/14/2019   PT End of Session - 11/14/19 1029    Visit Number 14    Number of Visits 25    Date for PT Re-Evaluation 12/13/19    PT Start Time 3491    PT Stop Time 1100    PT Time Calculation (min) 45 min    Equipment Utilized During Treatment Gait belt    Activity Tolerance Patient tolerated treatment well;No increased pain    Behavior During Therapy WFL for tasks assessed/performed           Past Medical History:  Diagnosis Date   Adenomatous colon polyp    Allergy    Arthritis    Cancer ([place])    Coronary artery disease    GERD (gastroesophageal reflux disease)    Hypertension    Wears hearing aid in both ears     Past Surgical History:  Procedure Laterality Date   cyst removed     ENDARTERECTOMY Right 08/04/2019   Procedure: ENDARTERECTOMY CAROTID;  Surgeon: Algernon Huxley, MD;  Location: [facility];  Service: Vascular;  Laterality: Right;   EXCISION OF TONGUE LESION Bilateral 05/05/2019   Procedure: EXCISION OF DORSAL TONGUE LESION;  Surgeon: Margaretha Sheffield, MD;  Location: [place];  Service: ENT;  Laterality: Bilateral;   skin cancer removed      There were no vitals filed for this visit.   Subjective Assessment - 11/14/19 1025    Subjective Pt reports he is still struggling with sleep at night. He reports 7/10 L shoulder pain upon arrival and overall just states he is not feeling great today. No falls reported since last therpay session.  States that he spoke with a nurse at the cardiology office since last therapy session and she advised him to start taking amlodipine.    Pertinent History Right lateral thalamic infarct mild ataxia and mild hemiparesis.  Patient went to rehab 07/07/19-07/15/19. Then he had surgery 7/1 for  s/p successful right CEA    Currently in Pain? Yes    Pain Score 7     Pain Location Shoulder    Pain Orientation Left    Pain Descriptors / Indicators Aching    Pain Type Chronic pain    Pain Onset More than a month ago    Pain Frequency Constant              TREATMENT   Ther-ex Octane L3 during history taking for warm-up x 5 minutes (72mnutes unbilled) Leg press 70# x 20, 85# x 20;  Standing hip strengthening with 4# ankle weights: Hip flexion marches x 20 BLE; HS curls x 20 BLE; Hip abduction x 20 BLE; Hip extension x 20 BLE;  Heel raises BUE support 2s hold x 20;  Seated LAQ with 4# ankle weights x 20 BLE;  Sit to stand from regular height chair with right lower extremity on 5 inch step 2 x 10;   Neuromuscular Re-education All balance exercises performed without UE support: 1/2 foam roll balance with flat side up x 30s; 1/2 foam roll heel/toe rockingh flat side up x 10 each; 1/2 foam roll tandem balance alternating forward LE 30s x 2 eachLE forward Airex alternating 6" step taps alternating LE x 10  each; Airex NBOS eyes open/closed x 30s each; Airex NBOS eyes open horizontal and vertical head turns x 30s each; Airex staggerred stance with front foot on 5" step x 60s each LE;   Pt educated throughout session about proper posture and technique with exercises. Improved exercise technique, movement at target joints, use of target muscles after min to mod verbal, visual, tactile cues.   Patient demonstrates excellent motivation throughout physical therapy session.Continued with balance and strengthening exercises during session today.  Patient reports he is not feeling great today so decreased resistance on the leg press. He requires more seated rest breaks today during session.  Continued with balance exercises on unstable surfaces such as half foam roller and Airex pad. Patient encouraged  to continue HEP and follow-up as scheduled.Pt will benefit from PT services to address deficits in strength, balance, and mobility in order to return to full function at home.                            PT Short Term Goals - 10/27/19 1110      PT SHORT TERM GOAL #1   Title Patient will increase lower extremity functional scale to >60/80 to demonstrate improved functional mobility and increased tolerance with ADLs.    Baseline 09/14/19= 43/80; 10/27/19: 49/80    Time 6    Period Weeks    Status Partially Met    Target Date 11/02/19      PT SHORT TERM GOAL #2   Title Patient will be modified independent in walking on even/uneven surface with least restrictive assistive device, for 20+ minutes without rest break, reporting some difficulty or less to improve walking tolerance with community ambulation including grocery shopping, going to church,etc.    Baseline Patient can ambulate for 10 minutes on level surfaces without incline and without AD, 10/27/19: Pt reports he can ambulate for 25 minutes;    Time 6    Period Weeks    Status Achieved    Target Date 11/02/19             PT Long Term Goals - 10/27/19 1112      PT LONG TERM GOAL #1   Title Patient will increase BLE gross strength to 4+/5 as to improve functional strength for independent gait, increased standing tolerance and increased ADL ability.    Baseline LLE 3/5 hip abd, 3+/5 hip flex, knee flex/ext 4/5, ankle 4/5; 10/27/19: LLE: 3+/5 hip abd, 4/5 hip flex, 5/5 knee extension, 4+/5 knee flexion, 4+/5 ankle DF    Time 12    Period Weeks    Status Partially Met    Target Date 12/07/19      PT LONG TERM GOAL #2   Title Patient will increase Berg Balance score by > 6 points to demonstrate decreased fall risk during functional activities.    Baseline 09/14/19= 42/56; 10/27/19: 51/56    Time 12    Period Weeks    Status Achieved      PT LONG TERM GOAL #3   Title Patient will ascend/descend 4 stairs  without rail assist independently without loss of balance to improve ability to get in/out of home.    Baseline 09/14/19= needs UE rail support and no AD; 10/27/19: needs single UE support with reciprocal pattern    Time 12    Period Weeks    Status Partially Met    Target Date 12/07/19      PT LONG  TERM GOAL #4   Title Patient will be independent in home exercise program to improve strength/mobility for better functional independence with ADLs.    Time 12    Period Weeks    Status On-going    Target Date 12/07/19                 Plan - 11/14/19 1030    Clinical Impression Statement Patient demonstrates excellent motivation throughout physical therapy session.  Continued with balance and strengthening exercises during session today.  Patient reports he is not feeling great today so decreased resistance on the leg press.  He requires more seated rest breaks today during session.  Continued with balance exercises on unstable surfaces such as half foam roller and Airex pad. Patient encouraged to continue HEP and follow-up as scheduled. Pt will benefit from PT services to address deficits in strength, balance, and mobility in order to return to full function at home.    Personal Factors and Comorbidities Age;Comorbidity 1    Comorbidities HTN    Examination-Activity Limitations Locomotion Level;Stand    Examination-Participation Restrictions Driving;Yard Work;Meal Prep    Stability/Clinical Decision Making Stable/Uncomplicated    Rehab Potential Good    PT Frequency 2x / week    PT Duration 12 weeks    PT Treatment/Interventions Patient/family education;Balance training;Neuromuscular re-education;Therapeutic exercise;Stair training    PT Next Visit Plan balance and strengthening    PT Home Exercise Plan Medbridge Access Code: [email or web address]           Patient will benefit from skilled therapeutic intervention in order to improve the following deficits and impairments:  Decreased activity  tolerance, Decreased strength, Difficulty walking, Decreased balance, Decreased coordination, Decreased mobility, Decreased endurance  Visit Diagnosis: Muscle weakness (generalized)  Other lack of coordination     Problem List Patient Active Problem List   Diagnosis Date Noted   Carotid stenosis, right 08/04/2019   Carotid stenosis 07/29/2019   Carotid artery plaque, right 07/19/2019   Hx of completed stroke 07/19/2019   AKI (acute kidney injury) ([place])    Hemiparesis affecting left side as late effect of stroke ([facility])    Decreased GFR    Dyslipidemia    Stroke ([place]) 07/06/2019   TIA (transient ischemic attack) 07/05/2019   Hypokalemia 07/05/2019   GERD without esophagitis 06/18/2017   Essential hypertension 08/19/2016   Vertigo 12/05/2015   Adenomatous colon polyp 01/31/2014   Basal cell carcinoma 09/23/2013   Hay fever 09/23/2013   Vitamin D deficiency 09/23/2013   Gastric catarrh 11/28/2011   Hyperlipidemia, mixed 11/28/2011   Preop cardiovascular exam 11/28/2011   Lyndel Safe Mikhaila Roh PT, DPT, GCS  Adream Parzych 11/14/2019, 5:07 PM  [place] MAIN [facility] SERVICES [address] [place], [place], [address] Phone: [phone]   Fax:  [phone]  Name: Russell Herman MRN: [id number] Date of Birth: 1945-07-16

## 2019-11-14 NOTE — Therapy (Addendum)
[place] MAIN [facility] SERVICES [address] [place], [place], [address] Phone: [phone]   Fax:  [phone]  Occupational Therapy Treatment  Patient Details  Name: Russell Herman MRN: [id number] Date of Birth: [birth year] Referring Provider (OT): Dr. Letta Pate   Encounter Date: 11/14/2019   OT End of Session - 11/14/19 1156    Visit Number 11    Number of Visits 24    Date for OT Re-Evaluation 12/07/19    OT Start Time 1103    OT Stop Time 1143    OT Time Calculation (min) 40 min    Activity Tolerance Patient tolerated treatment well    Behavior During Therapy [facility] for tasks assessed/performed           Past Medical History:  Diagnosis Date  . Adenomatous colon polyp   . Allergy   . Arthritis   . Cancer ([place])   . Coronary artery disease   . GERD (gastroesophageal reflux disease)   . Hypertension   . Wears hearing aid in both ears     Past Surgical History:  Procedure Laterality Date  . cyst removed    . ENDARTERECTOMY Right 08/04/2019   Procedure: ENDARTERECTOMY CAROTID;  Surgeon: Algernon Huxley, MD;  Location: [facility];  Service: Vascular;  Laterality: Right;  . EXCISION OF TONGUE LESION Bilateral 05/05/2019   Procedure: EXCISION OF DORSAL TONGUE LESION;  Surgeon: Margaretha Sheffield, MD;  Location: [place];  Service: ENT;  Laterality: Bilateral;  . skin cancer removed      There were no vitals filed for this visit.   Subjective Assessment - 11/14/19 1154    Subjective  Pt. reports that he has not been sleeping.    Patient is accompanied by: Family member    Pertinent History Pt. is a 74 y.o. male who was hospitalized with a CVA on June 1st, 2021. Imaging revealed a small acute infarct in right lateral thalamus, and mild small vessel disease. Pt. received inpatient rehab services, and home health services. Pt. underwent  a Right Carotid Endartectomy on 08/04/2019. Pt. resides with his wife, and has supportive family. Pt.  is a retired Administrator for [employer], and enjoys gardening.    Currently in Pain? Yes    Pain Score 7     Pain Location Shoulder    Pain Orientation Left    Pain Descriptors / Indicators Aching;Tingling    Pain Type Chronic pain    Multiple Pain Sites Yes    Pain Score 4    Pain Location Abdomen    Pain Orientation Lateral;Left    Pain Descriptors / Indicators Aching           Manual Therapy:  Pt.tolerated scapular mobilizations in sidelying for elevation, depression, abduction/rotation, and repositioned to sitting secondary to left lateral abdominal pain. STM were preformed to the scapular musculature secondary to increased tone, and tightness. Manual techniques were performed independent of, and in preparation for ROM.  There. Ex.   Pt. Tolerated PROM for the LUE, followed by performing AROM in all joint ranges of the LUE. ROM was performed following moist heat modality.  Pt. presented to the clinic reporting that he has not been able to sleep. Pt. Reports 4/10 pain in his left lateral abdominal region. Pt. was unable to tolerate sidelying on his right side secondary to pain in the left side. Pt. reports 7/10 pain in the left shoulder with increased tightness in his scapular region with tingling radiating down  to his left hand. Pt. Reports tightness in his left hip, and reports having more difficulty with walking. Pt. Reports that he was put on blood pressure medicine last week, and hasn't been feeling well.Pt. Reports that he stopped taking the anti inflammatory that Dr. Melrose Nakayama prescribed for his shoulder as pt. reported that he was concerned it was elevating his blood pressure. Pt. Tolerated the moist heat, and ROM well with decreased tightness in his scapular region. Persistent Left arm pain, and left lateral abdominal pain continues to be limiting. Pt. Continues to work on improving LUE functioning in order to work towards improving, and maximizing independence with ADLs, and  IADLs.                     OT Education - 11/14/19 1155    Education Details shoulder stabilization exercises, ROM, coordination    Person(s) Educated Patient    Methods Explanation;Demonstration    Comprehension Verbalized understanding;Need further instruction;Returned demonstration;Verbal cues required               OT Long Term Goals - 11/10/19 1703      OT LONG TERM GOAL #1   Title Pt. will improve LUE strength by 1 muscle grade to assist with gardening, and yardwork.    Baseline 4/5 shoulder flexion, abduction. Pt. has difficulty performing yardwork    Time 12    Period Weeks    Status On-going    Target Date 12/07/19      OT LONG TERM GOAL #2   Title Pt. will improve FOTO score by 2 points to maximize maximize independence with ADLs, and IADL functioning.    Baseline FOTO score: 59    Time 12    Period Weeks    Status On-going    Target Date 12/07/19      OT LONG TERM GOAL #3   Title Pt will improve Left hand [facility] skills in order to be able to manipulate small objects    Baseline Left hand [facility] skills conitnue to be limited. pt. has difficulty manipulating small objects efficiently during ADLs, and IADLs.    Time 12    Period Weeks    Status On-going    Target Date 12/07/19      OT LONG TERM GOAL #4   Title Pt. will imprive left hand motor control skills to be able to independently brush his hair with his LUE.    Baseline Pt. conitnues to have difficulty    Time 12    Period Weeks    Status On-going    Target Date 12/07/19      OT LONG TERM GOAL #5   Title Pt. will use his left hand to be able to independently hold, and use a washcloth    Baseline Eval: Pt. has difficulty    Time 12    Period Weeks    Status On-going    Target Date 12/07/19                 Plan - 11/14/19 1156    Clinical Impression Statement Pt. presented to the clinic reporting that he has not been able to sleep. Pt. Reports 4/10 pain in his left lateral  abdominal region. Pt. was unable to tolerate sidelying on his right side secondary to pain in the left side. Pt. reports 7/10 pain in the left shoulder with increased tightness in his scapular region with tingling radiating down to his left hand. Pt. Reports tightness in his  left hip, and reports having more difficulty with walking. Pt. Reports that he was put on blood pressure medicine last week, and hasn't been feeling well. Pt. reports that he stopped taking the anti inflammatory that Dr. Melrose Nakayama prescribed for his shoulder as pt. reported that he was concerned it was elevating his blood pressure. Pt. Tolerated the moist heat, and ROM well with decreased tightness in his scapular region. Persistent Left arm pain, and left lateral abdominal pain continues to be limiting. Pt. Continues to work on improving LUE functioning in order to work towards improving, and maximizing independence with ADLs, and IADLs.   OT Occupational Profile and History Problem Focused Assessment - Including review of records relating to presenting problem    Occupational performance deficits (Please refer to evaluation for details): ADL's;IADL's    Body Structure / Function / Physical Skills ADL;IADL;Balance;Proprioception;Strength;Coordination;UE functional use;FMC;Dexterity    Rehab Potential Good    Clinical Decision Making Several treatment options, min-mod task modification necessary    Comorbidities Affecting Occupational Performance: May have comorbidities impacting occupational performance    Modification or Assistance to Complete Evaluation  Min-Moderate modification of tasks or assist with assess necessary to complete eval    OT Frequency 2x / week    OT Duration 12 weeks    OT Treatment/Interventions Self-care/ADL training;Therapeutic exercise;DME and/or AE instruction;Patient/family education;Therapeutic activities;Energy conservation;Neuromuscular education    Consulted and Agree with Plan of Care Patient            Patient will benefit from skilled therapeutic intervention in order to improve the following deficits and impairments:   Body Structure / Function / Physical Skills: ADL, IADL, Balance, Proprioception, Strength, Coordination, UE functional use, FMC, Dexterity       Visit Diagnosis: Muscle weakness (generalized)  Other lack of coordination    Problem List Patient Active Problem List   Diagnosis Date Noted  . Carotid stenosis, right 08/04/2019  . Carotid stenosis 07/29/2019  . Carotid artery plaque, right 07/19/2019  . Hx of completed stroke 07/19/2019  . AKI (acute kidney injury) ([place])   . Hemiparesis affecting left side as late effect of stroke ([place])   . Decreased GFR   . Dyslipidemia   . Stroke ([place]) 07/06/2019  . TIA (transient ischemic attack) 07/05/2019  . Hypokalemia 07/05/2019  . GERD without esophagitis 06/18/2017  . Essential hypertension 08/19/2016  . Vertigo 12/05/2015  . Adenomatous colon polyp 01/31/2014  . Basal cell carcinoma 09/23/2013  . Hay fever 09/23/2013  . Vitamin D deficiency 09/23/2013  . Gastric catarrh 11/28/2011  . Hyperlipidemia, mixed 11/28/2011  . Preop cardiovascular exam 11/28/2011    Harrel Carina, MS, OTR/L 11/14/2019, 3:31 PM  [place] MAIN [facility] SERVICES [address] [place], [place], [address] Phone: [phone]   Fax:  [phone]  Name: Russell Herman MRN: [id number] Date of Birth: 01-13-1946

## 2019-11-17 ENCOUNTER — Ambulatory Visit: Payer: Medicare Other | Admitting: Occupational Therapy

## 2019-11-17 ENCOUNTER — Other Ambulatory Visit: Payer: Self-pay | Admitting: Pediatrics

## 2019-11-17 ENCOUNTER — Other Ambulatory Visit: Payer: Self-pay | Admitting: Acute Care

## 2019-11-17 ENCOUNTER — Ambulatory Visit: Payer: Medicare Other

## 2019-11-17 DIAGNOSIS — R278 Other lack of coordination: Secondary | ICD-10-CM

## 2019-11-17 DIAGNOSIS — M6281 Muscle weakness (generalized): Secondary | ICD-10-CM

## 2019-11-17 DIAGNOSIS — I6930 Unspecified sequelae of cerebral infarction: Secondary | ICD-10-CM | POA: Insufficient documentation

## 2019-11-17 DIAGNOSIS — I639 Cerebral infarction, unspecified: Secondary | ICD-10-CM

## 2019-11-22 ENCOUNTER — Ambulatory Visit: Payer: Medicare Other | Admitting: Occupational Therapy

## 2019-11-22 ENCOUNTER — Other Ambulatory Visit: Payer: Self-pay | Admitting: Physician Assistant

## 2019-11-22 ENCOUNTER — Ambulatory Visit: Payer: Medicare Other | Admitting: Physical Therapy

## 2019-11-22 ENCOUNTER — Other Ambulatory Visit (HOSPITAL_COMMUNITY): Payer: Self-pay | Admitting: Physician Assistant

## 2019-11-22 DIAGNOSIS — M12812 Other specific arthropathies, not elsewhere classified, left shoulder: Secondary | ICD-10-CM

## 2019-11-22 DIAGNOSIS — G8929 Other chronic pain: Secondary | ICD-10-CM

## 2019-11-24 ENCOUNTER — Ambulatory Visit: Payer: Medicare Other

## 2019-11-24 ENCOUNTER — Encounter: Payer: Self-pay | Admitting: Occupational Therapy

## 2019-11-24 ENCOUNTER — Ambulatory Visit: Payer: Medicare Other | Admitting: Occupational Therapy

## 2019-11-24 ENCOUNTER — Other Ambulatory Visit: Payer: Self-pay

## 2019-11-24 DIAGNOSIS — M6281 Muscle weakness (generalized): Secondary | ICD-10-CM

## 2019-11-24 DIAGNOSIS — R278 Other lack of coordination: Secondary | ICD-10-CM

## 2019-11-24 NOTE — Therapy (Signed)
[place] MAIN [facility] SERVICES [address] [place], [place], [address] Phone: [phone]   Fax:  [phone]  Occupational Therapy Treatment  Patient Details  Name: Russell Herman MRN: [id number] Date of Birth: 01/23/1946 Referring Provider (OT): Dr. Letta Pate   Encounter Date: 11/24/2019   OT End of Session - 11/24/19 1610    Visit Number 12    Number of Visits 24    Date for OT Re-Evaluation 12/07/19    OT Start Time 1103    OT Stop Time 1145    OT Time Calculation (min) 42 min    Activity Tolerance Patient tolerated treatment well    Behavior During Therapy [facility] for tasks assessed/performed           Past Medical History:  Diagnosis Date  . Adenomatous colon polyp   . Allergy   . Arthritis   . Cancer ([place])   . Coronary artery disease   . GERD (gastroesophageal reflux disease)   . Hypertension   . Wears hearing aid in both ears     Past Surgical History:  Procedure Laterality Date  . cyst removed    . ENDARTERECTOMY Right 08/04/2019   Procedure: ENDARTERECTOMY CAROTID;  Surgeon: Algernon Huxley, MD;  Location: [facility];  Service: Vascular;  Laterality: Right;  . EXCISION OF TONGUE LESION Bilateral 05/05/2019   Procedure: EXCISION OF DORSAL TONGUE LESION;  Surgeon: Margaretha Sheffield, MD;  Location: [place];  Service: ENT;  Laterality: Bilateral;  . skin cancer removed      There were no vitals filed for this visit.   Subjective Assessment - 11/24/19 1605    Subjective  Pt. reports that he is feeling better now, however is concerned that he may have had ministrokes from from the antiinflammatory he was taking.    Patient is accompanied by: Family member    Pertinent History Pt. is a 74 y.o. male who was hospitalized with a CVA on June 1st, 2021. Imaging revealed a small acute infarct in right lateral thalamus, and mild small vessel disease. Pt. received inpatient rehab services, and home health services. Pt. underwent  a  Right Carotid Endartectomy on 08/04/2019. Pt. resides with his wife, and has supportive family. Pt. is a retired Administrator for [employer], and enjoys gardening.    Currently in Pain? Yes    Pain Score 3     Pain Location Shoulder    Pain Orientation Left    Pain Descriptors / Indicators Aching    Pain Type Chronic pain    Pain Onset More than a month ago          Manual Therapy:  Pt.tolerated scapular mobilizations well in sidelying for elevation, depression, abduction/rotation. STM were preformed to the scapular musculature secondary to increased tone, and tightness. Manual techniques were performed independent of, and in preparation for ROM.  There. Ex.   Pt. performed trunk rotation, elongation stretches to the left, and right to normalize tone secondary to tightness in the trunk. Pt. Tolerated PROM for the LUE, AROM in all joint ranges of the LUE. Pt. Reports 3/10 pain from 25-75 degrees of  Active shoulder flexion in supine, and reports pain from 50 degrees to 25 degrees when returning his arm to his side. Pt. reports 3/10 pain at 50 degrees of shoulder flexion.    Pt. reports that he had an orthopedic appointment for his left shoulder this past week. Pt. Is scheduled to follow-up with Dr. Letta Pate tomorrow. Pt.  reports that he is scheduled for an MRI for his head, and left shoulder next week. Pt. presented with 3/10 pain in the left shoulder, which continues to persist. Pt. Continues to present with increased tightness in the left scapular region, was decreased today. Pt. also reports tingling in his left hand, tightness in his left torso, hip, and leg. Pt. Continues to work on improving LUE functioning in order to work towards improving, and maximizing independence with ADLs, and IADLs.                       OT Education - 11/24/19 1610    Education Details LUE functioning    Person(s) Educated Patient    Methods Explanation;Demonstration    Comprehension  Verbalized understanding;Need further instruction;Returned demonstration;Verbal cues required               OT Long Term Goals - 11/10/19 1703      OT LONG TERM GOAL #1   Title Pt. will improve LUE strength by 1 muscle grade to assist with gardening, and yardwork.    Baseline 4/5 shoulder flexion, abduction. Pt. has difficulty performing yardwork    Time 12    Period Weeks    Status On-going    Target Date 12/07/19      OT LONG TERM GOAL #2   Title Pt. will improve FOTO score by 2 points to maximize maximize independence with ADLs, and IADL functioning.    Baseline FOTO score: 59    Time 12    Period Weeks    Status On-going    Target Date 12/07/19      OT LONG TERM GOAL #3   Title Pt will improve Left hand [facility] skills in order to be able to manipulate small objects    Baseline Left hand [facility] skills conitnue to be limited. pt. has difficulty manipulating small objects efficiently during ADLs, and IADLs.    Time 12    Period Weeks    Status On-going    Target Date 12/07/19      OT LONG TERM GOAL #4   Title Pt. will imprive left hand motor control skills to be able to independently brush his hair with his LUE.    Baseline Pt. conitnues to have difficulty    Time 12    Period Weeks    Status On-going    Target Date 12/07/19      OT LONG TERM GOAL #5   Title Pt. will use his left hand to be able to independently hold, and use a washcloth    Baseline Eval: Pt. has difficulty    Time 12    Period Weeks    Status On-going    Target Date 12/07/19                 Plan - 11/24/19 1611    Clinical Impression Statement Pt. reports that he had an orthopedic appointment for his left shoulder this past week. Pt. Is scheduled to follow-up with Dr. Letta Pate tomorrow. Pt. reports that he is scheduled for an MRI for his head, and left shoulder next week. Pt. presented with 3/10 pain in the left shoulder, which continues to persist. Pt. Continues to present with increased  tightness in the left scapular region, was decreased today. Pt. also reports tingling in his left hand, tightness in his left torso, hip, and leg. Pt. Continues to work on improving LUE functioning in order to work towards improving, and maximizing  independence with ADLs, and IADLs.   OT Occupational Profile and History Problem Focused Assessment - Including review of records relating to presenting problem    Occupational performance deficits (Please refer to evaluation for details): ADL's;IADL's    Body Structure / Function / Physical Skills ADL;IADL;Balance;Proprioception;Strength;Coordination;UE functional use;FMC;Dexterity    Rehab Potential Good    Clinical Decision Making Several treatment options, min-mod task modification necessary    Comorbidities Affecting Occupational Performance: May have comorbidities impacting occupational performance    Modification or Assistance to Complete Evaluation  Min-Moderate modification of tasks or assist with assess necessary to complete eval    OT Frequency 2x / week    OT Duration 12 weeks    OT Treatment/Interventions Self-care/ADL training;Therapeutic exercise;DME and/or AE instruction;Patient/family education;Therapeutic activities;Energy conservation;Neuromuscular education    Consulted and Agree with Plan of Care Patient           Patient will benefit from skilled therapeutic intervention in order to improve the following deficits and impairments:   Body Structure / Function / Physical Skills: ADL, IADL, Balance, Proprioception, Strength, Coordination, UE functional use, FMC, Dexterity       Visit Diagnosis: Muscle weakness (generalized)  Other lack of coordination    Problem List Patient Active Problem List   Diagnosis Date Noted  . Carotid stenosis, right 08/04/2019  . Carotid stenosis 07/29/2019  . Carotid artery plaque, right 07/19/2019  . Hx of completed stroke 07/19/2019  . AKI (acute kidney injury) ([place])   . Hemiparesis  affecting left side as late effect of stroke ([place])   . Decreased GFR   . Dyslipidemia   . Stroke ([place]) 07/06/2019  . TIA (transient ischemic attack) 07/05/2019  . Hypokalemia 07/05/2019  . GERD without esophagitis 06/18/2017  . Essential hypertension 08/19/2016  . Vertigo 12/05/2015  . Adenomatous colon polyp 01/31/2014  . Basal cell carcinoma 09/23/2013  . Hay fever 09/23/2013  . Vitamin D deficiency 09/23/2013  . Gastric catarrh 11/28/2011  . Hyperlipidemia, mixed 11/28/2011  . Preop cardiovascular exam 11/28/2011    Harrel Carina, MS,  OTR/L 11/24/2019, 4:13 PM  [place] MAIN [facility] SERVICES [address] [place], [place], [address] Phone: [phone]   Fax:  [phone]  Name: Russell Herman MRN: [id number] Date of Birth: February 08, 1945

## 2019-11-25 ENCOUNTER — Other Ambulatory Visit: Payer: Self-pay

## 2019-11-25 ENCOUNTER — Encounter: Payer: Self-pay | Admitting: Physical Medicine & Rehabilitation

## 2019-11-25 ENCOUNTER — Encounter: Payer: Medicare Other | Attending: Physical Medicine & Rehabilitation | Admitting: Physical Medicine & Rehabilitation

## 2019-11-25 VITALS — BP 144/84 | HR 76 | Temp 98.2°F | Ht 65.0 in | Wt 156.8 lb

## 2019-11-25 DIAGNOSIS — M7542 Impingement syndrome of left shoulder: Secondary | ICD-10-CM | POA: Diagnosis present

## 2019-11-25 NOTE — Patient Instructions (Signed)
THe left shoulder injection may take several days to take full effect .  Will followup in ~2 wks to check your progress with therapy and with your pain complaints

## 2019-11-25 NOTE — Progress Notes (Signed)
Shoulder injectionLeft subacromial  With  ultrasound guidance)  Indication:Left Shoulder pain not relieved by medication management and other conservative care.  Informed consent was obtained after describing risks and benefits of the procedure with the patient, this includes bleeding, bruising, infection and medication side effects. The patient wishes to proceed and has given written consent. Patient was placed in a seated position. TheLeft shoulder was marked and prepped with betadine in the subacromial area. A 25-gauge 1-1/2 inch needle was inserted into the subacromial area 2l 1% lidocaine infiltrated in skin and subcut tissue, then 22g 65mm ECHO block needle inserted using long axis [place] visualization into left supacromial bursa. After negative draw back for blood, a solution containing 1 mL of 6 mg per ML betamethasone and 4 mL of 1% lidocaine was injected. A band aid was applied. The patient tolerated the procedure well. Post procedure instructions were given.

## 2019-11-28 ENCOUNTER — Other Ambulatory Visit: Payer: Self-pay

## 2019-11-28 ENCOUNTER — Encounter: Payer: Self-pay | Admitting: Occupational Therapy

## 2019-11-28 ENCOUNTER — Ambulatory Visit: Payer: Medicare Other | Admitting: Occupational Therapy

## 2019-11-28 ENCOUNTER — Encounter: Payer: Self-pay | Admitting: Physical Therapy

## 2019-11-28 ENCOUNTER — Ambulatory Visit: Payer: Medicare Other | Admitting: Physical Therapy

## 2019-11-28 DIAGNOSIS — M6281 Muscle weakness (generalized): Secondary | ICD-10-CM | POA: Diagnosis not present

## 2019-11-28 DIAGNOSIS — R278 Other lack of coordination: Secondary | ICD-10-CM

## 2019-11-28 DIAGNOSIS — I6309 Cerebral infarction due to thrombosis of other precerebral artery: Secondary | ICD-10-CM

## 2019-11-28 DIAGNOSIS — I69354 Hemiplegia and hemiparesis following cerebral infarction affecting left non-dominant side: Secondary | ICD-10-CM

## 2019-11-28 NOTE — Therapy (Signed)
[place] MAIN [facility] SERVICES [address] [place], [place], [address] Phone: [phone]   Fax:  [phone]  Physical Therapy Treatment  Patient Details  Name: Russell Herman MRN: [id number] Date of Birth: 1945/12/07 No data recorded  Encounter Date: 11/28/2019   PT End of Session - 11/28/19 1208    Visit Number 15    Number of Visits 25    Date for PT Re-Evaluation 12/13/19    PT Start Time 3382    PT Stop Time 1225    PT Time Calculation (min) 40 min    Equipment Utilized During Treatment Gait belt    Activity Tolerance Patient tolerated treatment well;No increased pain    Behavior During Therapy WFL for tasks assessed/performed           Past Medical History:  Diagnosis Date  . Adenomatous colon polyp   . Allergy   . Arthritis   . Cancer ([place])   . Coronary artery disease   . GERD (gastroesophageal reflux disease)   . Hypertension   . Wears hearing aid in both ears     Past Surgical History:  Procedure Laterality Date  . cyst removed    . ENDARTERECTOMY Right 08/04/2019   Procedure: ENDARTERECTOMY CAROTID;  Surgeon: Algernon Huxley, MD;  Location: [facility] ORS;  Service: Vascular;  Laterality: Right;  . EXCISION OF TONGUE LESION Bilateral 05/05/2019   Procedure: EXCISION OF DORSAL TONGUE LESION;  Surgeon: Margaretha Sheffield, MD;  Location: [place];  Service: ENT;  Laterality: Bilateral;  . skin cancer removed      There were no vitals filed for this visit.   Subjective Assessment - 11/28/19 1152    Subjective . He reports 5/10 L shoulder pain upon arrival and overall just states he is not feeling great today. No falls reported since last therpay session.  States that he spoke with a nurse at the cardiology office since last therapy session and she advised him to start taking amlodipine.    Pertinent History Right lateral thalamic infarct mild ataxia and mild hemiparesis. Patient went to rehab 07/07/19-07/15/19. Then he had  surgery 7/1 for  s/p successful right CEA    Currently in Pain? Yes    Pain Score 7     Pain Location Shoulder    Pain Orientation Left    Pain Descriptors / Indicators Aching    Pain Onset More than a month ago           Neuromuscular Re-education  Matrix 22. 5 lbs fwd/bwd, side to side x 5  Lunge from foam to BOSU ball x 15 BLE Hurdle  fwd/bwd, side to side x 20 each direction Tandem on 1/2 foam and lateral tapes to stepping stones without UE support x 20 standing on foam and fwd tapes to stepping stones without UE support x 20 Side stepping on foam without UE support x 2 lengths Heel/toe raises without UE support 3s hold x 10 each 1/2 foam roll balance with flat side up 30s x 2 reps 1/2 foam roll balance with flat side down 30s x 2 reps Lateral side steps from foam to 6 inch stool left and right x 15 Backwards stepping from foam to 6 inch stool x 15  Pt educated throughout session about proper posture and technique with exercises. Improved exercise technique, movement at target joints, use of target muscles after min to mod verbal, visual, tactile cues.  PT Education - 11/28/19 1207    Education Details HEP    Person(s) Educated Patient    Methods Explanation    Comprehension Verbalized understanding;Tactile cues required            PT Short Term Goals - 10/27/19 1110      PT SHORT TERM GOAL #1   Title Patient will increase lower extremity functional scale to >60/80 to demonstrate improved functional mobility and increased tolerance with ADLs.    Baseline 09/14/19= 43/80; 10/27/19: 49/80    Time 6    Period Weeks    Status Partially Met    Target Date 11/02/19      PT SHORT TERM GOAL #2   Title Patient will be modified independent in walking on even/uneven surface with least restrictive assistive device, for 20+ minutes without rest break, reporting some difficulty or less to improve walking tolerance with community  ambulation including grocery shopping, going to church,etc.    Baseline Patient can ambulate for 10 minutes on level surfaces without incline and without AD, 10/27/19: Pt reports he can ambulate for 25 minutes;    Time 6    Period Weeks    Status Achieved    Target Date 11/02/19             PT Long Term Goals - 10/27/19 1112      PT LONG TERM GOAL #1   Title Patient will increase BLE gross strength to 4+/5 as to improve functional strength for independent gait, increased standing tolerance and increased ADL ability.    Baseline LLE 3/5 hip abd, 3+/5 hip flex, knee flex/ext 4/5, ankle 4/5; 10/27/19: LLE: 3+/5 hip abd, 4/5 hip flex, 5/5 knee extension, 4+/5 knee flexion, 4+/5 ankle DF    Time 12    Period Weeks    Status Partially Met    Target Date 12/07/19      PT LONG TERM GOAL #2   Title Patient will increase Berg Balance score by > 6 points to demonstrate decreased fall risk during functional activities.    Baseline 09/14/19= 42/56; 10/27/19: 51/56    Time 12    Period Weeks    Status Achieved      PT LONG TERM GOAL #3   Title Patient will ascend/descend 4 stairs without rail assist independently without loss of balance to improve ability to get in/out of home.    Baseline 09/14/19= needs UE rail support and no AD; 10/27/19: needs single UE support with reciprocal pattern    Time 12    Period Weeks    Status Partially Met    Target Date 12/07/19      PT LONG TERM GOAL #4   Title Patient will be independent in home exercise program to improve strength/mobility for better functional independence with ADLs.    Time 12    Period Weeks    Status On-going    Target Date 12/07/19                 Plan - 11/28/19 1210    Clinical Impression Statement Pt demonstrates increased postural sway when standing on uneven surface and requires // bars to steady, and demonstrates fatigue at end of set of exercises focused on strength and endurance.  Patient will continue to benefit  from skilled PT for improved balance and strength   Personal Factors and Comorbidities Age;Comorbidity 1    Comorbidities HTN    Examination-Activity Limitations Locomotion Level;Stand    Examination-Participation Restrictions Driving;Yard Work;Meal  Prep    Stability/Clinical Decision Making Stable/Uncomplicated    Rehab Potential Good    PT Frequency 2x / week    PT Duration 12 weeks    PT Treatment/Interventions Patient/family education;Balance training;Neuromuscular re-education;Therapeutic exercise;Stair training    PT Next Visit Plan balance and strengthening    PT Home Exercise Plan Medbridge Access Code: [email or web address]           Patient will benefit from skilled therapeutic intervention in order to improve the following deficits and impairments:  Decreased activity tolerance, Decreased strength, Difficulty walking, Decreased balance, Decreased coordination, Decreased mobility, Decreased endurance  Visit Diagnosis: Muscle weakness (generalized)  Other lack of coordination  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 (HCC)  Hemiparesis affecting left side as late effect of stroke [facility])     Problem List Patient Active Problem List   Diagnosis Date Noted  . Carotid stenosis, right 08/04/2019  . Carotid stenosis 07/29/2019  . Carotid artery plaque, right 07/19/2019  . Hx of completed stroke 07/19/2019  . AKI (acute kidney injury) ([place])   . Hemiparesis affecting left side as late effect of stroke ([place])   . Decreased GFR   . Dyslipidemia   . Stroke ([place]) 07/06/2019  . TIA (transient ischemic attack) 07/05/2019  . Hypokalemia 07/05/2019  . GERD without esophagitis 06/18/2017  . Essential hypertension 08/19/2016  . Vertigo 12/05/2015  . Adenomatous colon polyp 01/31/2014  . Basal cell carcinoma 09/23/2013  . Hay fever 09/23/2013  . Vitamin D deficiency 09/23/2013  . Gastric catarrh 11/28/2011  . Hyperlipidemia, mixed 11/28/2011  . Preop  cardiovascular exam 11/28/2011    Alanson Puls, [place] DPT 11/28/2019, 12:15 PM  [place] MAIN [facility] SERVICES [address] [place], [place], [address] Phone: [phone]   Fax:  [phone]  Name: Russell Herman MRN: [id number] Date of Birth: [birth year]

## 2019-11-28 NOTE — Therapy (Signed)
[place] MAIN [facility] SERVICES [address] [place], [place], [address] Phone: [phone]   Fax:  [phone]  Occupational Therapy Treatment  Patient Details  Name: EDUARD PENKALA MRN: [id number] Date of Birth: October 02, 1945 Referring Provider (OT): Dr. Letta Pate   Encounter Date: 11/28/2019   OT End of Session - 11/28/19 1233    Visit Number 13    Number of Visits 24    Date for OT Re-Evaluation 12/07/19    OT Start Time 1101    OT Stop Time 1145    OT Time Calculation (min) 44 min    Activity Tolerance Patient tolerated treatment well    Behavior During Therapy [facility] for tasks assessed/performed           Past Medical History:  Diagnosis Date  . Adenomatous colon polyp   . Allergy   . Arthritis   . Cancer ([place])   . Coronary artery disease   . GERD (gastroesophageal reflux disease)   . Hypertension   . Wears hearing aid in both ears     Past Surgical History:  Procedure Laterality Date  . cyst removed    . ENDARTERECTOMY Right 08/04/2019   Procedure: ENDARTERECTOMY CAROTID;  Surgeon: Algernon Huxley, MD;  Location: [facility];  Service: Vascular;  Laterality: Right;  . EXCISION OF TONGUE LESION Bilateral 05/05/2019   Procedure: EXCISION OF DORSAL TONGUE LESION;  Surgeon: Margaretha Sheffield, MD;  Location: [place];  Service: ENT;  Laterality: Bilateral;  . skin cancer removed      There were no vitals filed for this visit.   Subjective Assessment - 11/28/19 1228    Subjective  Pt reports improved pain following recent doctor visit however plans to have further testing done.    Patient is accompanied by: Family member    Pertinent History Pt. is a 74 y.o. male who was hospitalized with a CVA on June 1st, 2021. Imaging revealed a small acute infarct in right lateral thalamus, and mild small vessel disease. Pt. received inpatient rehab services, and home health services. Pt. underwent  a Right Carotid Endartectomy on 08/04/2019. Pt.  resides with his wife, and has supportive family. Pt. is a retired Administrator for [employer], and enjoys gardening.    Patient Stated Goals To be as independent as possible.    Currently in Pain? Yes    Pain Score 5     Pain Location Shoulder    Pain Orientation Left    Pain Descriptors / Indicators Aching    Pain Type Chronic pain    Pain Onset More than a month ago           THEREX Pt tolerated moist heat to L shoulder/side in preparation for ROM. Pt tolerated LUE A/PROM for 1 set sidelying and 1 set seated for 10-20 reps at all joints. Pt reported 5/10 pain during shoulder extension from 80 to 40 degrees - pt compensates during AROM by abducting ~30 degrees during shoulder flexion/extension. Pt worked on grasping one inch resistive cubes alternating thumb opposition to the tip of the 2nd through 3rd digits. The board was positioned flat on the table and pt pressed them back into place while isolating 2nd through 3rd digits. Pt worked on Astronomer from a container and holding as many in his hand at one time. Pt improved from 4 tiles to 7 tiles over 10 trials. Pt required assistance to stabilize container and cues for pacing.  OT Education - 11/28/19 1233    Education Details HEP    Person(s) Educated Patient    Methods Explanation;Demonstration    Comprehension Verbalized understanding;Need further instruction;Returned demonstration;Verbal cues required               OT Long Term Goals - 11/28/19 1242      OT LONG TERM GOAL #1   Title Pt. will improve LUE strength by 1 muscle grade to assist with gardening, and yardwork.    Baseline 4/5 shoulder flexion, abduction. Pt. has difficulty performing yardwork    Time 12    Period Weeks    Status On-going      OT LONG TERM GOAL #2   Title Pt. will improve FOTO score by 2 points to maximize maximize independence with ADLs, and IADL functioning.    Baseline FOTO score: 59    Time  12    Period Weeks    Status On-going      OT LONG TERM GOAL #3   Title Pt will improve Left hand [facility] skills in order to be able to manipulate small objects    Baseline Left hand [facility] skills conitnue to be limited. pt. has difficulty manipulating small objects efficiently during ADLs, and IADLs.    Time 12    Period Weeks    Status On-going      OT LONG TERM GOAL #4   Title Pt. will imprive left hand motor control skills to be able to independently brush his hair with his LUE.    Baseline Pt. conitnues to have difficulty    Time 12    Period Weeks    Status On-going      OT LONG TERM GOAL #5   Title Pt. will use his left hand to be able to independently hold, and use a washcloth    Baseline Eval: Pt. has difficulty    Time 12    Period Weeks    Status On-going                 Plan - 11/28/19 1234    Clinical Impression Statement Pt reports waiting for MRI later this week and continued pain in LUE. Pt presented with 5/10 pain in LUE and L side. Pt presents with improved LUE ROM but persistent pain with mobility. Pt continues to work on improving LUE functioning to maximize independence with I/ADLs.    OT Occupational Profile and History Problem Focused Assessment - Including review of records relating to presenting problem    Occupational performance deficits (Please refer to evaluation for details): ADL's;IADL's    Body Structure / Function / Physical Skills ADL;IADL;Balance;Proprioception;Strength;Coordination;UE functional use;FMC;Dexterity    Rehab Potential Good    Clinical Decision Making Several treatment options, min-mod task modification necessary    Comorbidities Affecting Occupational Performance: May have comorbidities impacting occupational performance    Modification or Assistance to Complete Evaluation  Min-Moderate modification of tasks or assist with assess necessary to complete eval    OT Frequency 2x / week    OT Duration 12 weeks    OT  Treatment/Interventions Self-care/ADL training;Therapeutic exercise;DME and/or AE instruction;Patient/family education;Therapeutic activities;Energy conservation;Neuromuscular education    Consulted and Agree with Plan of Care Patient           Patient will benefit from skilled therapeutic intervention in order to improve the following deficits and impairments:   Body Structure / Function / Physical Skills: ADL, IADL, Balance, Proprioception, Strength, Coordination, UE functional use, [place], Dexterity  Visit Diagnosis: Other lack of coordination  Hemiparesis affecting left side as late effect of stroke ([place])  Muscle weakness (generalized)    Problem List Patient Active Problem List   Diagnosis Date Noted  . Carotid stenosis, right 08/04/2019  . Carotid stenosis 07/29/2019  . Carotid artery plaque, right 07/19/2019  . Hx of completed stroke 07/19/2019  . AKI (acute kidney injury) ([place])   . Hemiparesis affecting left side as late effect of stroke ([place])   . Decreased GFR   . Dyslipidemia   . Stroke ([place]) 07/06/2019  . TIA (transient ischemic attack) 07/05/2019  . Hypokalemia 07/05/2019  . GERD without esophagitis 06/18/2017  . Essential hypertension 08/19/2016  . Vertigo 12/05/2015  . Adenomatous colon polyp 01/31/2014  . Basal cell carcinoma 09/23/2013  . Hay fever 09/23/2013  . Vitamin D deficiency 09/23/2013  . Gastric catarrh 11/28/2011  . Hyperlipidemia, mixed 11/28/2011  . Preop cardiovascular exam 11/28/2011    Dessie Coma, M.S. OTR/L  11/28/19, 2:11 PM  ascom [phone]  [place] MAIN [facility] SERVICES [address] [place], [place], [address] Phone: [phone]   Fax:  [phone]  Name: LOURDES MANNING MRN: [id number] Date of Birth: November 21, 1945

## 2019-11-30 ENCOUNTER — Ambulatory Visit: Payer: Medicare Other

## 2019-11-30 ENCOUNTER — Other Ambulatory Visit: Payer: Self-pay

## 2019-11-30 ENCOUNTER — Ambulatory Visit: Payer: Medicare Other | Admitting: Occupational Therapy

## 2019-11-30 ENCOUNTER — Encounter: Payer: Self-pay | Admitting: Occupational Therapy

## 2019-11-30 VITALS — BP 123/81 | HR 83

## 2019-11-30 DIAGNOSIS — R278 Other lack of coordination: Secondary | ICD-10-CM

## 2019-11-30 DIAGNOSIS — M6281 Muscle weakness (generalized): Secondary | ICD-10-CM

## 2019-11-30 NOTE — Therapy (Signed)
[place] MAIN [facility] SERVICES [address] [place], [place], [address] Phone: [phone]   Fax:  [phone]  Physical Therapy Treatment  Patient Details  Name: Russell Herman MRN: [id number] Date of Birth: 11/04/1945 No data recorded  Encounter Date: 11/30/2019   PT End of Session - 11/30/19 1026    Visit Number 16    Number of Visits 25    Date for PT Re-Evaluation 12/13/19    PT Start Time 1017    PT Stop Time 1100    PT Time Calculation (min) 43 min    Equipment Utilized During Treatment Gait belt    Activity Tolerance Patient tolerated treatment well;No increased pain    Behavior During Therapy WFL for tasks assessed/performed           Past Medical History:  Diagnosis Date   Adenomatous colon polyp    Allergy    Arthritis    Cancer ([place])    Coronary artery disease    GERD (gastroesophageal reflux disease)    Hypertension    Wears hearing aid in both ears     Past Surgical History:  Procedure Laterality Date   cyst removed     ENDARTERECTOMY Right 08/04/2019   Procedure: ENDARTERECTOMY CAROTID;  Surgeon: Algernon Huxley, MD;  Location: [facility];  Service: Vascular;  Laterality: Right;   EXCISION OF TONGUE LESION Bilateral 05/05/2019   Procedure: EXCISION OF DORSAL TONGUE LESION;  Surgeon: Margaretha Sheffield, MD;  Location: [place];  Service: ENT;  Laterality: Bilateral;   skin cancer removed      Vitals:   11/30/19 1020  BP: 123/81  Pulse: 83     Subjective Assessment - 11/30/19 1020    Subjective Pt reports 5/10 L shoulder pain upon arrival. No falls reported since last therapy session. Pt reports that he is having a R shoulder MRI this weekend as well as a repeat brain MRI because his provider is concerned about him having another "mini stroke" recently.    Pertinent History Right lateral thalamic infarct mild ataxia and mild hemiparesis. Patient went to rehab 07/07/19-07/15/19. Then he had surgery 7/1  for  s/p successful right CEA    Currently in Pain? Yes    Pain Score 5     Pain Location Shoulder    Pain Orientation Left    Pain Descriptors / Indicators Aching    Pain Type Chronic pain    Pain Onset More than a month ago            TREATMENT    Ther-ex  NuStep L2 x 5 minutes for warm-up during history (3 minutes unbilled); Matrix 22.5# forward, backward, R lateral, and L lateral x 3 each direction; Leg press 70# 2 x 20;   Neuromuscular Re-education  All balance exercises performed with  6" orange hurdle forward and lateral steps x 10 each direction; 1/2 foam roll A/P balance x 30s; 1/2 foam roll A/P heel/toe rocking x 30s; 1/2 foam roll tandem balance alternating forward LE 2 x 30s each;   Pt educated throughout session about proper posture and technique with exercises. Improved exercise technique, movement at target joints, use of target muscles after min to mod verbal, visual, tactile cues.   Patient demonstrates excellent motivation throughout physical therapy session.Continued with balance and strengthening exercises during session today.  Patient reports continued L low back discomfort so unable to progress resistance on the leg press today. Continued with balance exercises on unstable surfaces  such as half foam roller as well as exercises requiring single leg stability such as step-overs. Patient encouraged to continue HEP and follow-up as scheduled.Pt will benefit from PT services to address deficits in strength, balance, and mobility in order to return to full function at home.                               PT Short Term Goals - 10/27/19 1110      PT SHORT TERM GOAL #1   Title Patient will increase lower extremity functional scale to >60/80 to demonstrate improved functional mobility and increased tolerance with ADLs.    Baseline 09/14/19= 43/80; 10/27/19: 49/80    Time 6    Period Weeks    Status Partially Met    Target Date  11/02/19      PT SHORT TERM GOAL #2   Title Patient will be modified independent in walking on even/uneven surface with least restrictive assistive device, for 20+ minutes without rest break, reporting some difficulty or less to improve walking tolerance with community ambulation including grocery shopping, going to church,etc.    Baseline Patient can ambulate for 10 minutes on level surfaces without incline and without AD, 10/27/19: Pt reports he can ambulate for 25 minutes;    Time 6    Period Weeks    Status Achieved    Target Date 11/02/19             PT Long Term Goals - 10/27/19 1112      PT LONG TERM GOAL #1   Title Patient will increase BLE gross strength to 4+/5 as to improve functional strength for independent gait, increased standing tolerance and increased ADL ability.    Baseline LLE 3/5 hip abd, 3+/5 hip flex, knee flex/ext 4/5, ankle 4/5; 10/27/19: LLE: 3+/5 hip abd, 4/5 hip flex, 5/5 knee extension, 4+/5 knee flexion, 4+/5 ankle DF    Time 12    Period Weeks    Status Partially Met    Target Date 12/07/19      PT LONG TERM GOAL #2   Title Patient will increase Berg Balance score by > 6 points to demonstrate decreased fall risk during functional activities.    Baseline 09/14/19= 42/56; 10/27/19: 51/56    Time 12    Period Weeks    Status Achieved      PT LONG TERM GOAL #3   Title Patient will ascend/descend 4 stairs without rail assist independently without loss of balance to improve ability to get in/out of home.    Baseline 09/14/19= needs UE rail support and no AD; 10/27/19: needs single UE support with reciprocal pattern    Time 12    Period Weeks    Status Partially Met    Target Date 12/07/19      PT LONG TERM GOAL #4   Title Patient will be independent in home exercise program to improve strength/mobility for better functional independence with ADLs.    Time 12    Period Weeks    Status On-going    Target Date 12/07/19                 Plan -  11/30/19 1027    Clinical Impression Statement Patient demonstrates excellent motivation throughout physical therapy session.  Continued with balance and strengthening exercises during session today.  Patient reports continued L low back discomfort so unable to progress resistance on the leg press  today. Continued with balance exercises on unstable surfaces such as half foam roller as well as exercises requiring single leg stability such as step-overs. Patient encouraged to continue HEP and follow-up as scheduled. Pt will benefit from PT services to address deficits in strength, balance, and mobility in order to return to full function at home.    Personal Factors and Comorbidities Age;Comorbidity 1    Comorbidities HTN    Examination-Activity Limitations Locomotion Level;Stand    Examination-Participation Restrictions Driving;Yard Work;Meal Prep    Stability/Clinical Decision Making Stable/Uncomplicated    Rehab Potential Good    PT Frequency 2x / week    PT Duration 12 weeks    PT Treatment/Interventions Patient/family education;Balance training;Neuromuscular re-education;Therapeutic exercise;Stair training    PT Next Visit Plan balance and strengthening    PT Home Exercise Plan Medbridge Access Code: [email or web address]           Patient will benefit from skilled therapeutic intervention in order to improve the following deficits and impairments:  Decreased activity tolerance, Decreased strength, Difficulty walking, Decreased balance, Decreased coordination, Decreased mobility, Decreased endurance  Visit Diagnosis: Muscle weakness (generalized)  Other lack of coordination     Problem List Patient Active Problem List   Diagnosis Date Noted   Carotid stenosis, right 08/04/2019   Carotid stenosis 07/29/2019   Carotid artery plaque, right 07/19/2019   Hx of completed stroke 07/19/2019   AKI (acute kidney injury) ([place])    Hemiparesis affecting left side as late effect of stroke ([facility])     Decreased GFR    Dyslipidemia    Stroke ([place]) 07/06/2019   TIA (transient ischemic attack) 07/05/2019   Hypokalemia 07/05/2019   GERD without esophagitis 06/18/2017   Essential hypertension 08/19/2016   Vertigo 12/05/2015   Adenomatous colon polyp 01/31/2014   Basal cell carcinoma 09/23/2013   Hay fever 09/23/2013   Vitamin D deficiency 09/23/2013   Gastric catarrh 11/28/2011   Hyperlipidemia, mixed 11/28/2011   Preop cardiovascular exam 11/28/2011   Lyndel Safe Nakiya Rallis PT, DPT, GCS  Demiana Crumbley 11/30/2019, 3:27 PM  [place] MAIN [facility] SERVICES [address] [place], [place], [address] Phone: [phone]   Fax:  [phone]  Name: Russell Herman MRN: [id number] Date of Birth: 30-May-1945

## 2019-12-01 ENCOUNTER — Encounter: Payer: Self-pay | Admitting: Occupational Therapy

## 2019-12-01 NOTE — Therapy (Signed)
[place] MAIN [facility] SERVICES [address] [place], [place], [address] Phone: [phone]   Fax:  [phone]  Occupational Therapy Treatment  Patient Details  Name: Russell Herman MRN: [id number] Date of Birth: [birth year] Referring Provider (OT): Dr. Letta Pate   Encounter Date: 11/30/2019   OT End of Session - 11/30/19 1342    Visit Number 14    Number of Visits 24    Date for OT Re-Evaluation 12/07/19    OT Start Time 1100    OT Stop Time 1145    OT Time Calculation (min) 45 min    Activity Tolerance Patient tolerated treatment well    Behavior During Therapy [facility] for tasks assessed/performed           Past Medical History:  Diagnosis Date  . Adenomatous colon polyp   . Allergy   . Arthritis   . Cancer ([place])   . Coronary artery disease   . GERD (gastroesophageal reflux disease)   . Hypertension   . Wears hearing aid in both ears     Past Surgical History:  Procedure Laterality Date  . cyst removed    . ENDARTERECTOMY Right 08/04/2019   Procedure: ENDARTERECTOMY CAROTID;  Surgeon: Algernon Huxley, MD;  Location: [facility] ORS;  Service: Vascular;  Laterality: Right;  . EXCISION OF TONGUE LESION Bilateral 05/05/2019   Procedure: EXCISION OF DORSAL TONGUE LESION;  Surgeon: Margaretha Sheffield, MD;  Location: [place];  Service: ENT;  Laterality: Bilateral;  . skin cancer removed      There were no vitals filed for this visit.   Subjective Assessment - 11/30/19 1340    Subjective  Pt. reports that he received an injection in his left shoulder.    Patient is accompanied by: Family member    Pertinent History Pt. is a 73 y.o. male who was hospitalized with a CVA on June 1st, 2021. Imaging revealed a small acute infarct in right lateral thalamus, and mild small vessel disease. Pt. received inpatient rehab services, and home health services. Pt. underwent  a Right Carotid Endartectomy on 08/04/2019. Pt. resides with his wife, and has  supportive family. Pt. is a retired Administrator for [employer], and enjoys gardening.    Patient Stated Goals To be as independent as possible.    Currently in Pain? Yes    Pain Score 5     Pain Location Shoulder    Pain Orientation Left    Pain Descriptors / Indicators Aching    Pain Type Chronic pain    Pain Score 5    Pain Location Abdomen    Pain Orientation Left    Pain Descriptors / Indicators Aching    Pain Type Chronic pain          Manual Therapy:  Pt.tolerated scapular mobilizations well in sidelying for elevation, depression, abduction/rotation. STM were preformed to the scapular musculature secondary to increased tone, and tightness. Manual techniques were performed independent of, and in preparation for ROM.  There. Ex.   Pt. performed trunk rotation, elongation stretches to the left, and right to normalize tone secondary to tightness in the trunk. Pt. Tolerated PROM for the LUE, AROM in all joint ranges of the LUE. Pt. Reports 3/10 pain from 50-75 degrees of  Active shoulder flexion in supine, and reports pain at 50 degrees when returning his arm to his side. Pt. reports 3/10 pain at 50 degrees of shoulder abduction.  Pt.perfromed shoulder retraction with red theraband seated. Pt.  Worked with 2# dumbbell ex. for elbow flexion and extension, forearm supination/pronation, wrist flexion/extension, and radial deviation. Pt. requires rest breaks and verbal cues for proper technique.  Pt. reports that he is scheduled for an MRI for his head, and LUE on Saturday. Pt.presented with 3/10 pain in the left shoulder. Pt. Continues to present with tightness in the left scapular region, however was improved. Pt. also reports tingling in his left hand, tightness in his left torso, hip, and leg. Pt. Continues to work on improving LUE functioning in order to work towards improving, and maximizing independence with ADLs, and IADLs.                      OT Education -  11/30/19 1342    Education Details HEP    Person(s) Educated Patient    Methods Explanation;Demonstration    Comprehension Verbalized understanding;Need further instruction;Returned demonstration;Verbal cues required               OT Long Term Goals - 11/28/19 1242      OT LONG TERM GOAL #1   Title Pt. will improve LUE strength by 1 muscle grade to assist with gardening, and yardwork.    Baseline 4/5 shoulder flexion, abduction. Pt. has difficulty performing yardwork    Time 12    Period Weeks    Status On-going      OT LONG TERM GOAL #2   Title Pt. will improve FOTO score by 2 points to maximize maximize independence with ADLs, and IADL functioning.    Baseline FOTO score: 59    Time 12    Period Weeks    Status On-going      OT LONG TERM GOAL #3   Title Pt will improve Left hand [facility] skills in order to be able to manipulate small objects    Baseline Left hand [facility] skills conitnue to be limited. pt. has difficulty manipulating small objects efficiently during ADLs, and IADLs.    Time 12    Period Weeks    Status On-going      OT LONG TERM GOAL #4   Title Pt. will imprive left hand motor control skills to be able to independently brush his hair with his LUE.    Baseline Pt. conitnues to have difficulty    Time 12    Period Weeks    Status On-going      OT LONG TERM GOAL #5   Title Pt. will use his left hand to be able to independently hold, and use a washcloth    Baseline Eval: Pt. has difficulty    Time 12    Period Weeks    Status On-going                 Plan - 11/30/19 1343    Clinical Impression Statement Pt. reports that he is scheduled for an MRI for his head, and LUE on Saturday. Pt.presented with 3/10 pain in the left shoulder. Pt. Continues to present with tightness in the left scapular region, however was improved. Pt. also reports tingling in his left hand, tightness in his left torso, hip, and leg. Pt. Continues to work on improving LUE  functioning in order to work towards improving, and maximizing independence with ADLs, and IADLs.   Occupational performance deficits (Please refer to evaluation for details): ADL's;IADL's    Body Structure / Function / Physical Skills ADL;IADL;Balance;Proprioception;Strength;Coordination;UE functional use;FMC;Dexterity    Rehab Potential Good    Clinical Decision  Making Several treatment options, min-mod task modification necessary    Comorbidities Affecting Occupational Performance: May have comorbidities impacting occupational performance    Modification or Assistance to Complete Evaluation  Min-Moderate modification of tasks or assist with assess necessary to complete eval    OT Frequency 2x / week    OT Duration 12 weeks    OT Treatment/Interventions Self-care/ADL training;Therapeutic exercise;DME and/or AE instruction;Patient/family education;Therapeutic activities;Energy conservation;Neuromuscular education    Consulted and Agree with Plan of Care Patient           Patient will benefit from skilled therapeutic intervention in order to improve the following deficits and impairments:   Body Structure / Function / Physical Skills: ADL, IADL, Balance, Proprioception, Strength, Coordination, UE functional use, FMC, Dexterity       Visit Diagnosis: Muscle weakness (generalized)  Other lack of coordination    Problem List Patient Active Problem List   Diagnosis Date Noted  . Carotid stenosis, right 08/04/2019  . Carotid stenosis 07/29/2019  . Carotid artery plaque, right 07/19/2019  . Hx of completed stroke 07/19/2019  . AKI (acute kidney injury) ([place])   . Hemiparesis affecting left side as late effect of stroke ([place])   . Decreased GFR   . Dyslipidemia   . Stroke ([place]) 07/06/2019  . TIA (transient ischemic attack) 07/05/2019  . Hypokalemia 07/05/2019  . GERD without esophagitis 06/18/2017  . Essential hypertension 08/19/2016  . Vertigo 12/05/2015  . Adenomatous colon  polyp 01/31/2014  . Basal cell carcinoma 09/23/2013  . Hay fever 09/23/2013  . Vitamin D deficiency 09/23/2013  . Gastric catarrh 11/28/2011  . Hyperlipidemia, mixed 11/28/2011  . Preop cardiovascular exam 11/28/2011    Harrel Carina, MS, OTR/L 12/01/2019, 8:44 AM  [place] MAIN [facility] SERVICES [address] [place], [place], [address] Phone: [phone]   Fax:  [phone]  Name: Russell Herman MRN: [id number] Date of Birth: 10-Oct-1945

## 2019-12-03 ENCOUNTER — Ambulatory Visit
Admission: RE | Admit: 2019-12-03 | Discharge: 2019-12-03 | Disposition: A | Discharge status: Home / Self Care | Payer: Medicare Other | Source: Ambulatory Visit | Attending: Physician Assistant | Admitting: Physician Assistant

## 2019-12-03 ENCOUNTER — Other Ambulatory Visit: Payer: Self-pay

## 2019-12-03 ENCOUNTER — Ambulatory Visit
Admission: RE | Admit: 2019-12-03 | Discharge: 2019-12-03 | Disposition: A | Discharge status: Home / Self Care | Payer: Medicare Other | Source: Ambulatory Visit | Attending: Acute Care | Admitting: Acute Care

## 2019-12-03 DIAGNOSIS — I639 Cerebral infarction, unspecified: Secondary | ICD-10-CM | POA: Insufficient documentation

## 2019-12-03 DIAGNOSIS — M25512 Pain in left shoulder: Secondary | ICD-10-CM | POA: Diagnosis present

## 2019-12-03 DIAGNOSIS — M12812 Other specific arthropathies, not elsewhere classified, left shoulder: Secondary | ICD-10-CM | POA: Insufficient documentation

## 2019-12-03 DIAGNOSIS — G8929 Other chronic pain: Secondary | ICD-10-CM | POA: Insufficient documentation

## 2019-12-06 ENCOUNTER — Encounter: Payer: Self-pay | Admitting: Physical Therapy

## 2019-12-06 ENCOUNTER — Ambulatory Visit: Payer: Medicare Other | Attending: Physical Medicine & Rehabilitation | Admitting: Physical Therapy

## 2019-12-06 ENCOUNTER — Ambulatory Visit: Payer: Medicare Other | Admitting: Occupational Therapy

## 2019-12-06 ENCOUNTER — Other Ambulatory Visit: Payer: Self-pay

## 2019-12-06 ENCOUNTER — Encounter: Payer: Self-pay | Admitting: Occupational Therapy

## 2019-12-06 DIAGNOSIS — R278 Other lack of coordination: Secondary | ICD-10-CM | POA: Diagnosis present

## 2019-12-06 DIAGNOSIS — I6309 Cerebral infarction due to thrombosis of other precerebral artery: Secondary | ICD-10-CM

## 2019-12-06 DIAGNOSIS — M6281 Muscle weakness (generalized): Secondary | ICD-10-CM

## 2019-12-06 DIAGNOSIS — I69354 Hemiplegia and hemiparesis following cerebral infarction affecting left non-dominant side: Secondary | ICD-10-CM

## 2019-12-06 NOTE — Therapy (Signed)
[place] MAIN [facility] SERVICES [address] [place], [place], [address] Phone: [phone]   Fax:  [phone]  Occupational Therapy Treatment  Patient Details  Name: Russell Herman MRN: [id number] Date of Birth: [birth year] Referring Provider (OT): Dr. Letta Pate   Encounter Date: 12/06/2019   OT End of Session - 12/08/19 0932    Visit Number 15    Number of Visits 24    Date for OT Re-Evaluation 12/07/19    OT Start Time 1101    OT Stop Time 1145    OT Time Calculation (min) 44 min    Activity Tolerance Patient tolerated treatment well    Behavior During Therapy [facility] for tasks assessed/performed           Past Medical History:  Diagnosis Date  . Adenomatous colon polyp   . Allergy   . Arthritis   . Cancer ([place])   . Coronary artery disease   . GERD (gastroesophageal reflux disease)   . Hypertension   . Wears hearing aid in both ears     Past Surgical History:  Procedure Laterality Date  . cyst removed    . ENDARTERECTOMY Right 08/04/2019   Procedure: ENDARTERECTOMY CAROTID;  Surgeon: Algernon Huxley, MD;  Location: [facility];  Service: Vascular;  Laterality: Right;  . EXCISION OF TONGUE LESION Bilateral 05/05/2019   Procedure: EXCISION OF DORSAL TONGUE LESION;  Surgeon: Margaretha Sheffield, MD;  Location: [place];  Service: ENT;  Laterality: Bilateral;  . skin cancer removed      There were no vitals filed for this visit.   Subjective Assessment - 12/08/19 0931    Subjective  Patient reports he had an MRI this past weekend on his head and shoulder but has not gotten the results yet.  Is planning to call today and follow up.    Patient is accompanied by: Family member    Pertinent History Pt. is a 74 y.o. male who was hospitalized with a CVA on June 1st, 2021. Imaging revealed a small acute infarct in right lateral thalamus, and mild small vessel disease. Pt. received inpatient rehab services, and home health services. Pt.  underwent  a Right Carotid Endartectomy on 08/04/2019. Pt. resides with his wife, and has supportive family. Pt. is a retired Administrator for [employer], and enjoys gardening.    Patient Stated Goals To be as independent as possible.    Currently in Pain? Yes    Pain Score 3     Pain Location Shoulder    Pain Orientation Left    Pain Descriptors / Indicators Aching    Pain Type Acute pain    Pain Onset More than a month ago    Pain Onset 1 to 4 weeks ago           Neuromuscular re-education:   Patient seen with use of [place] discs with manipulation skills to work towards improved coordination skills with flipping one section 20 discs timed with left hand 46 secs Right hand 35 secs  2 sections of 40 discs Left hand 1 min 18 secs.  Simultaneous flipping/turning with bilateral hands for 2 sections 48 secs.   Place and removed discs from the board to set up and break down task with use of left hand for reaching.   Grooved pegboard to pick up and place into grid with focus on turning and manipulation of pieces to fit into grid in order of presentation.  Cues for prehension patterns for  turning and manipulation of pieces.    Response to tx:   Patient awaiting results of MRI for shoulder pain, plans to call today and see if he can get his results and/or follow up appt.  Patient seen for focus on fine motor coordination and manipulation skills with use of left UE.  Continues to demonstrate impairments in speed, dexterity and coordination skills, occasional cues for prehension patterns.  Remains limited by left shoulder pain which he reports has worsened over the last month. Will plan for re certification and reassessment of skills next session.                     OT Education - 12/09/19 0932    Education Details ROM, coordination skills    Person(s) Educated Patient    Methods Explanation;Demonstration    Comprehension Verbalized understanding;Need further instruction;Returned  demonstration;Verbal cues required               OT Long Term Goals - 11/28/19 1242      OT LONG TERM GOAL #1   Title Pt. will improve LUE strength by 1 muscle grade to assist with gardening, and yardwork.    Baseline 4/5 shoulder flexion, abduction. Pt. has difficulty performing yardwork    Time 12    Period Weeks    Status On-going      OT LONG TERM GOAL #2   Title Pt. will improve FOTO score by 2 points to maximize maximize independence with ADLs, and IADL functioning.    Baseline FOTO score: 59    Time 12    Period Weeks    Status On-going      OT LONG TERM GOAL #3   Title Pt will improve Left hand [facility] skills in order to be able to manipulate small objects    Baseline Left hand [facility] skills conitnue to be limited. pt. has difficulty manipulating small objects efficiently during ADLs, and IADLs.    Time 12    Period Weeks    Status On-going      OT LONG TERM GOAL #4   Title Pt. will imprive left hand motor control skills to be able to independently brush his hair with his LUE.    Baseline Pt. conitnues to have difficulty    Time 12    Period Weeks    Status On-going      OT LONG TERM GOAL #5   Title Pt. will use his left hand to be able to independently hold, and use a washcloth    Baseline Eval: Pt. has difficulty    Time 12    Period Weeks    Status On-going                 Plan - 12/08/19 0933    Clinical Impression Statement Patient awaiting results of MRI for shoulder pain, plans to call today and see if he can get his results and/or follow up appt.  Patient seen for focus on fine motor coordination and manipulation skills with use of left UE.  Continues to demonstrate impairments in speed, dexterity and coordination skills, occasional cues for prehension patterns.  Remains limited by left shoulder pain which he reports has worsened over the last month. Will plan for re certification and reassessment of skills next session.    Occupational performance  deficits (Please refer to evaluation for details): ADL's;IADL's    Body Structure / Function / Physical Skills ADL;IADL;Balance;Proprioception;Strength;Coordination;UE functional use;FMC;Dexterity    Rehab Potential Good  Clinical Decision Making Several treatment options, min-mod task modification necessary    Comorbidities Affecting Occupational Performance: May have comorbidities impacting occupational performance    Modification or Assistance to Complete Evaluation  Min-Moderate modification of tasks or assist with assess necessary to complete eval    OT Frequency 2x / week    OT Duration 12 weeks    OT Treatment/Interventions Self-care/ADL training;Therapeutic exercise;DME and/or AE instruction;Patient/family education;Therapeutic activities;Energy conservation;Neuromuscular education    Consulted and Agree with Plan of Care Patient           Patient will benefit from skilled therapeutic intervention in order to improve the following deficits and impairments:   Body Structure / Function / Physical Skills: ADL, IADL, Balance, Proprioception, Strength, Coordination, UE functional use, FMC, Dexterity       Visit Diagnosis: Muscle weakness (generalized)  Other lack of coordination  Hemiparesis affecting left side as late effect of stroke (HCC)  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 Carotid stenosis, right 08/04/2019  . Carotid stenosis 07/29/2019  . Carotid artery plaque, right 07/19/2019  . Hx of completed stroke 07/19/2019  . AKI (acute kidney injury) ([place])   . Hemiparesis affecting left side as late effect of stroke ([place])   . Decreased GFR   . Dyslipidemia   . Stroke ([place]) 07/06/2019  . TIA (transient ischemic attack) 07/05/2019  . Hypokalemia 07/05/2019  . GERD without esophagitis 06/18/2017  . Essential hypertension 08/19/2016  . Vertigo 12/05/2015   . Adenomatous colon polyp 01/31/2014  . Basal cell carcinoma 09/23/2013  . Hay fever 09/23/2013  . Vitamin D deficiency 09/23/2013  . Gastric catarrh 11/28/2011  . Hyperlipidemia, mixed 11/28/2011  . Preop cardiovascular exam 11/28/2011   Achilles Dunk, OTR/L, CLT' Harriette Tovey 12/09/2019, 9:40 AM  [place] [address] [place], [place], [address] Phone: [phone]   Fax:  [phone]  Name: Russell Herman MRN: [id number] Date of Birth: 1945/02/07

## 2019-12-06 NOTE — Therapy (Addendum)
[place] MAIN [facility] SERVICES [address] [place], [place], [address] Phone: [phone]   Fax:  [phone]  Physical Therapy Treatment  Patient Details  Name: Russell Herman MRN: [id number] Date of Birth: 1946-01-30 No data recorded  Encounter Date: 12/06/2019   PT End of Session - 12/06/19 1035    Visit Number 17    Number of Visits 25    Date for PT Re-Evaluation 12/13/19    PT Start Time 1022    PT Stop Time 1100    PT Time Calculation (min) 38 min    Equipment Utilized During Treatment Gait belt    Activity Tolerance Patient tolerated treatment well;No increased pain    Behavior During Therapy [facility] for tasks assessed/performed           Past Medical History:  Diagnosis Date  . Adenomatous colon polyp   . Allergy   . Arthritis   . Cancer ([place])   . Coronary artery disease   . GERD (gastroesophageal reflux disease)   . Hypertension   . Wears hearing aid in both ears     Past Surgical History:  Procedure Laterality Date  . cyst removed    . ENDARTERECTOMY Right 08/04/2019   Procedure: ENDARTERECTOMY CAROTID;  Surgeon: Algernon Huxley, MD;  Location: [facility];  Service: Vascular;  Laterality: Right;  . EXCISION OF TONGUE LESION Bilateral 05/05/2019   Procedure: EXCISION OF DORSAL TONGUE LESION;  Surgeon: Margaretha Sheffield, MD;  Location: [place];  Service: ENT;  Laterality: Bilateral;  . skin cancer removed      There were no vitals filed for this visit.   Subjective Assessment - 12/06/19 1033    Subjective Pt reports 3/10 L shoulder pain , 4/ 5 L hip pain and 5/5 Left leg pain. No falls reported since last therapy session. Pt reports that he is having a R shoulder MRI this weekend as well as a repeat brain MRI because his provider is concerned about him having another "mini stroke" recently.    Pertinent History Right lateral thalamic infarct mild ataxia and mild hemiparesis. Patient went to rehab 07/07/19-07/15/19. Then he  had surgery 7/1 for  s/p successful right CEA    Currently in Pain? No/denies    Pain Score 0-No pain    Pain Onset More than a month ago           Ther-ex  Oct[employer] fitness L2 x 5 minutes  Matrix 22.5# forward, backward, R lateral, and L lateral x 5 each direction Leg press 55# 3 x 20  Neuromuscular Re-education   hurdle forward/ bwd ,  and lateral steps x 20 each direction 1/2 foam roll heel toe rock balance x 30s    Pt educated throughout session about proper posture and technique with exercises. Improved exercise technique, movement at target joints, use of target muscles after min to mod verbal, visual, tactile cues.                          PT Education - 12/06/19 1034    Education Details HEP    Person(s) Educated Patient    Methods Explanation    Comprehension Verbalized understanding            PT Short Term Goals - 10/27/19 1110      PT SHORT TERM GOAL #1   Title Patient will increase lower extremity functional scale to >60/80 to demonstrate improved functional mobility  and increased tolerance with ADLs.    Baseline 09/14/19= 43/80; 10/27/19: 49/80    Time 6    Period Weeks    Status Partially Met    Target Date 11/02/19      PT SHORT TERM GOAL #2   Title Patient will be modified independent in walking on even/uneven surface with least restrictive assistive device, for 20+ minutes without rest break, reporting some difficulty or less to improve walking tolerance with community ambulation including grocery shopping, going to church,etc.    Baseline Patient can ambulate for 10 minutes on level surfaces without incline and without AD, 10/27/19: Pt reports he can ambulate for 25 minutes;    Time 6    Period Weeks    Status Achieved    Target Date 11/02/19             PT Long Term Goals - 12/06/19 1451      PT LONG TERM GOAL #1   Title Patient will increase BLE gross strength to 4+/5 as to improve functional strength for  independent gait, increased standing tolerance and increased ADL ability.    Baseline LLE 3/5 hip abd, 3+/5 hip flex, knee flex/ext 4/5, ankle 4/5; 10/27/19: LLE: 3+/5 hip abd, 4/5 hip flex, 5/5 knee extension, 4+/5 knee flexion, 4+/5 ankle DF    Time 12    Status Partially Met    Target Date 02/29/20      PT LONG TERM GOAL #2   Title Patient will increase Berg Balance score by > 6 points to demonstrate decreased fall risk during functional activities.    Baseline 09/14/19= 42/56; 10/27/19: 51/56    Time 12    Period Weeks    Status Achieved    Target Date 12/07/19      PT LONG TERM GOAL #3   Title Patient will ascend/descend 4 stairs without rail assist independently without loss of balance to improve ability to get in/out of home.    Baseline 09/14/19= needs UE rail support and no AD; 10/27/19: needs single UE support with reciprocal pattern    Time 12    Period Weeks    Status Partially Met    Target Date 02/29/20      PT LONG TERM GOAL #4   Title Patient will be independent in home exercise program to improve strength/mobility for better functional independence with ADLs.    Time 12    Period Weeks    Status On-going    Target Date 02/29/20                 Plan - 12/06/19 1035    Clinical Impression Statement Patient instructed in beginning balance and coordination exercise. Patient required mod VCs  for gait to improve weight shift and postural control. Patient requires min VCs to improve posture with gait.  Patients would benefit from additional skilled PT intervention to improve balance/gait safety and reduce fall risk.    Personal Factors and Comorbidities Age;Comorbidity 1    Comorbidities HTN    Examination-Activity Limitations Locomotion Level;Stand    Examination-Participation Restrictions Driving;Yard Work;Meal Prep    Stability/Clinical Decision Making Stable/Uncomplicated    Rehab Potential Good    PT Frequency 2x / week    PT Duration 12 weeks    PT  Treatment/Interventions Patient/family education;Balance training;Neuromuscular re-education;Therapeutic exercise;Stair training    PT Next Visit Plan balance and strengthening    PT Home Exercise Plan [place] Access Code: [email or web address]  Patient will benefit from skilled therapeutic intervention in order to improve the following deficits and impairments:  Decreased activity tolerance, Decreased strength, Difficulty walking, Decreased balance, Decreased coordination, Decreased mobility, Decreased endurance  Visit Diagnosis: Muscle weakness (generalized)  Other lack of coordination  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 Carotid stenosis, right 08/04/2019  . Carotid stenosis 07/29/2019  . Carotid artery plaque, right 07/19/2019  . Hx of completed stroke 07/19/2019  . AKI (acute kidney injury) ([place])   . Hemiparesis affecting left side as late effect of stroke ([place])   . Decreased GFR   . Dyslipidemia   . Stroke ([place]) 07/06/2019  . TIA (transient ischemic attack) 07/05/2019  . Hypokalemia 07/05/2019  . GERD without esophagitis 06/18/2017  . Essential hypertension 08/19/2016  . Vertigo 12/05/2015  . Adenomatous colon polyp 01/31/2014  . Basal cell carcinoma 09/23/2013  . Hay fever 09/23/2013  . Vitamin D deficiency 09/23/2013  . Gastric catarrh 11/28/2011  . Hyperlipidemia, mixed 11/28/2011  . Preop cardiovascular exam 11/28/2011    Alanson Puls, [place] DPT 12/06/2019, 2:54 PM  [place] MAIN [facility] SERVICES [address] [place], [place], [address] Phone: [phone]   Fax:  [phone]  Name: Russell Herman MRN: [id number] Date of Birth: 10-23-1945

## 2019-12-06 NOTE — Addendum Note (Signed)
Addended by: Alanson Puls on: 12/06/2019 04:52 PM   Modules accepted: Orders

## 2019-12-08 ENCOUNTER — Ambulatory Visit: Payer: Medicare Other | Admitting: Physical Therapy

## 2019-12-08 ENCOUNTER — Other Ambulatory Visit: Payer: Self-pay

## 2019-12-08 ENCOUNTER — Encounter: Payer: Self-pay | Admitting: Physical Therapy

## 2019-12-08 ENCOUNTER — Ambulatory Visit: Payer: Medicare Other | Admitting: Occupational Therapy

## 2019-12-08 DIAGNOSIS — M6281 Muscle weakness (generalized): Secondary | ICD-10-CM | POA: Diagnosis not present

## 2019-12-08 DIAGNOSIS — I69354 Hemiplegia and hemiparesis following cerebral infarction affecting left non-dominant side: Secondary | ICD-10-CM

## 2019-12-08 DIAGNOSIS — R278 Other lack of coordination: Secondary | ICD-10-CM

## 2019-12-08 DIAGNOSIS — I6309 Cerebral infarction due to thrombosis of other precerebral artery: Secondary | ICD-10-CM

## 2019-12-08 NOTE — Therapy (Signed)
[place] MAIN [facility] SERVICES [address] [place], [place], [address] Phone: [phone]   Fax:  [phone]  Physical Therapy Treatment  Patient Details  Name: NATALIO SALOIS MRN: [id number] Date of Birth: 25-Jul-1945 No data recorded  Encounter Date: 12/08/2019   PT End of Session - 12/08/19 1115    Visit Number 18    Number of Visits 25    Date for PT Re-Evaluation 12/13/19    PT Start Time 1145    PT Stop Time 1225    PT Time Calculation (min) 40 min    Equipment Utilized During Treatment Gait belt    Activity Tolerance Patient tolerated treatment well;No increased pain    Behavior During Therapy WFL for tasks assessed/performed           Past Medical History:  Diagnosis Date   Adenomatous colon polyp    Allergy    Arthritis    Cancer ([place])    Coronary artery disease    GERD (gastroesophageal reflux disease)    Hypertension    Wears hearing aid in both ears     Past Surgical History:  Procedure Laterality Date   cyst removed     ENDARTERECTOMY Right 08/04/2019   Procedure: ENDARTERECTOMY CAROTID;  Surgeon: Algernon Huxley, MD;  Location: [facility] ORS;  Service: Vascular;  Laterality: Right;   EXCISION OF TONGUE LESION Bilateral 05/05/2019   Procedure: EXCISION OF DORSAL TONGUE LESION;  Surgeon: Margaretha Sheffield, MD;  Location: [place];  Service: ENT;  Laterality: Bilateral;   skin cancer removed      There were no vitals filed for this visit.   Subjective Assessment - 12/08/19 1115    Subjective Pt reports 3/10 L shoulder pain , 4/ 5 L hip pain and 5/5 Left leg pain. No falls reported since last therapy session. Pt reports that he is having a R shoulder MRI this weekend as well as a repeat brain MRI because his provider is concerned about him having another "mini stroke" recently.    Pertinent History Right lateral thalamic infarct mild ataxia and mild hemiparesis. Patient went to rehab 07/07/19-07/15/19. Then he  had surgery 7/1 for  s/p successful right CEA    Pain Onset More than a month ago    Pain Onset 1 to 4 weeks ago           Treatment: Neuromuscular Training: Stool: staggered stance, head turns side/side, up/down x 5 reps each, each foot in front; VCs for proper technique and positioning for each exercise  Rockerboard Lateral weight shift x10 reps each direction, no UE support, CGA for safety with VCs to tap in each direction, controlling the speed Anterior/Posterior weight shift x10 reps, no UE support, CGA for safety with VCs to utilize ankles to shift weight over toes and back to heels  Hurdle: Forward and backward stepping over hurdle x15 each direction, VCs to take big enough steps and to try to increase speed to work on coordination  Side stepping over hurdle 15x each direction  Blue Foam: Side stepping x10 on blue balance CGA for safety, VCs for taking a big enough step   BOSU: Lunge to BOSU ball x 10 , cues for going slow and to control the speed   Pt educated throughout session about proper posture and technique with exercises. Improved exercise technique, movement at target joints, use of target muscles after min to mod verbal, visual, tactile cues. CGA and Min to mod verbal cues  used throughout with increased in postural sway and LOB most seen with narrow base of support and while on uneven surfaces.                         PT Education - 12/08/19 1115    Education Details HEP    Person(s) Educated Patient    Methods Explanation    Comprehension Verbalized understanding            PT Short Term Goals - 10/27/19 1110      PT SHORT TERM GOAL #1   Title Patient will increase lower extremity functional scale to >60/80 to demonstrate improved functional mobility and increased tolerance with ADLs.    Baseline 09/14/19= 43/80; 10/27/19: 49/80    Time 6    Period Weeks    Status Partially Met    Target Date 11/02/19      PT SHORT TERM GOAL #2    Title Patient will be modified independent in walking on even/uneven surface with least restrictive assistive device, for 20+ minutes without rest break, reporting some difficulty or less to improve walking tolerance with community ambulation including grocery shopping, going to church,etc.    Baseline Patient can ambulate for 10 minutes on level surfaces without incline and without AD, 10/27/19: Pt reports he can ambulate for 25 minutes;    Time 6    Period Weeks    Status Achieved    Target Date 11/02/19             PT Long Term Goals - 12/06/19 1451      PT LONG TERM GOAL #1   Title Patient will increase BLE gross strength to 4+/5 as to improve functional strength for independent gait, increased standing tolerance and increased ADL ability.    Baseline LLE 3/5 hip abd, 3+/5 hip flex, knee flex/ext 4/5, ankle 4/5; 10/27/19: LLE: 3+/5 hip abd, 4/5 hip flex, 5/5 knee extension, 4+/5 knee flexion, 4+/5 ankle DF    Time 12    Status Partially Met    Target Date 02/29/20      PT LONG TERM GOAL #2   Title Patient will increase Berg Balance score by > 6 points to demonstrate decreased fall risk during functional activities.    Baseline 09/14/19= 42/56; 10/27/19: 51/56    Time 12    Period Weeks    Status Achieved    Target Date 12/07/19      PT LONG TERM GOAL #3   Title Patient will ascend/descend 4 stairs without rail assist independently without loss of balance to improve ability to get in/out of home.    Baseline 09/14/19= needs UE rail support and no AD; 10/27/19: needs single UE support with reciprocal pattern    Time 12    Period Weeks    Status Partially Met    Target Date 02/29/20      PT LONG TERM GOAL #4   Title Patient will be independent in home exercise program to improve strength/mobility for better functional independence with ADLs.    Time 12    Period Weeks    Status On-going    Target Date 02/29/20                 Plan - 12/08/19 1116    Clinical  Impression Statement Patient instructed in advanced LE strengthening and intermediate balance exercise. Patient required min VCS to improve weight shift for better stance control. Patient would benefit from additional  skilled PT intervention to improve strength, balance and gait safety.   Personal Factors and Comorbidities Age;Comorbidity 1    Comorbidities HTN    Examination-Activity Limitations Locomotion Level;Stand    Examination-Participation Restrictions Driving;Yard Work;Meal Prep    Stability/Clinical Decision Making Stable/Uncomplicated    Rehab Potential Good    PT Frequency 2x / week    PT Duration 12 weeks    PT Treatment/Interventions Patient/family education;Balance training;Neuromuscular re-education;Therapeutic exercise;Stair training    PT Next Visit Plan balance and strengthening    PT Home Exercise Plan Medbridge Access Code: [email or web address]           Patient will benefit from skilled therapeutic intervention in order to improve the following deficits and impairments:  Decreased activity tolerance, Decreased strength, Difficulty walking, Decreased balance, Decreased coordination, Decreased mobility, Decreased endurance  Visit Diagnosis: Muscle weakness (generalized)  Other lack of coordination  Hemiparesis affecting left side as late effect of stroke ([place])  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 [facility])     Problem List Patient Active Problem List   Diagnosis Date Noted   Carotid stenosis, right 08/04/2019   Carotid stenosis 07/29/2019   Carotid artery plaque, right 07/19/2019   Hx of completed stroke 07/19/2019   AKI (acute kidney injury) ([place])    Hemiparesis affecting left side as late effect of stroke ([place])    Decreased GFR    Dyslipidemia    Stroke ([place]) 07/06/2019   TIA (transient ischemic attack) 07/05/2019   Hypokalemia 07/05/2019   GERD without esophagitis 06/18/2017   Essential hypertension 08/19/2016    Vertigo 12/05/2015   Adenomatous colon polyp 01/31/2014   Basal cell carcinoma 09/23/2013   Hay fever 09/23/2013   Vitamin D deficiency 09/23/2013   Gastric catarrh 11/28/2011   Hyperlipidemia, mixed 11/28/2011   Preop cardiovascular exam 11/28/2011    Alanson Puls, PT DPT 12/08/2019, 11:16 AM  [place] [address] [place], [place], [address] Phone: [phone]   Fax:  [phone]  Name: JAYEL INKS MRN: [id number] Date of Birth: 12-Mar-1945

## 2019-12-09 ENCOUNTER — Encounter: Payer: Self-pay | Admitting: Occupational Therapy

## 2019-12-09 NOTE — Therapy (Signed)
[place] MAIN [facility] SERVICES [address] [place], [place], [address] Phone: [phone]   Fax:  [phone]  Occupational Therapy Treatment/Recertification   Patient Details  Name: CARLISLE ENKE MRN: [id number] Date of Birth: [birth year] Referring Provider (OT): Dr. Letta Pate   Encounter Date: 12/08/2019   OT End of Session - 12/09/19 1527    Visit Number 16    Number of Visits 24    Date for OT Re-Evaluation 03/01/20    OT Start Time 1055    OT Stop Time 1145    OT Time Calculation (min) 50 min    Activity Tolerance Patient tolerated treatment well    Behavior During Therapy [facility] for tasks assessed/performed           Past Medical History:  Diagnosis Date  . Adenomatous colon polyp   . Allergy   . Arthritis   . Cancer ([place])   . Coronary artery disease   . GERD (gastroesophageal reflux disease)   . Hypertension   . Wears hearing aid in both ears     Past Surgical History:  Procedure Laterality Date  . cyst removed    . ENDARTERECTOMY Right 08/04/2019   Procedure: ENDARTERECTOMY CAROTID;  Surgeon: Algernon Huxley, MD;  Location: [facility] ORS;  Service: Vascular;  Laterality: Right;  . EXCISION OF TONGUE LESION Bilateral 05/05/2019   Procedure: EXCISION OF DORSAL TONGUE LESION;  Surgeon: Margaretha Sheffield, MD;  Location: [place];  Service: ENT;  Laterality: Bilateral;  . skin cancer removed      There were no vitals filed for this visit.   Subjective Assessment - 12/09/19 1524    Subjective  Patient reports he got the results of his MRI, has a rotator cuff tear and will plan to have surgery.  Pt reports he will meet with the surgeon on nov 16 to discuss further.  "Its not going to get any better if I don't fix it."    Pertinent History Pt. is a 74 y.o. male who was hospitalized with a CVA on June 1st, 2021. Imaging revealed a small acute infarct in right lateral thalamus, and mild small vessel disease. Pt. received inpatient  rehab services, and home health services. Pt. underwent  a Right Carotid Endartectomy on 08/04/2019. Pt. resides with his wife, and has supportive family. Pt. is a retired Administrator for [employer], and enjoys gardening.    Patient Stated Goals To be as independent as possible.    Currently in Pain? Yes    Pain Score 3     Pain Location Shoulder    Pain Orientation Left    Pain Descriptors / Indicators Aching    Pain Type Acute pain    Pain Onset More than a month ago    Multiple Pain Sites No              [facility] OT Assessment - 12/09/19 0001      Assessment   Medical Diagnosis cVA    Referring Provider (OT) Dr. Letta Pate    Onset Date/Surgical Date 07/05/19    Hand Dominance Right      Observation/Other Assessments   Focus on Therapeutic Outcomes (FOTO)  55      Coordination   Right 9 Hole Peg Test 23    Left 9 Hole Peg Test 35      Hand Function   Right Hand Grip (lbs) 70    Right Hand Lateral Pinch 18 lbs    Right Hand  3 Point Pinch 14 lbs    Left Hand Grip (lbs) 55    Left Hand Lateral Pinch 17 lbs    Left 3 point pinch 12 lbs           Patient seen for recertification this date, measurements performed for assessment of left UE, see above flowsheet for details. Right shoulder flexion 148 degrees, left shoulder flexion/ABD combo 124 degrees with pain  FOTO score this date 80 (had been 59 at eval), patient reports increased pain and decreased motion in left UE over the last couple months and had a MRI last week to assess, results revealed per chart:  Significant tendinopathy the and partial-thickness bursal surface tears along the musculotendinous junction region of the supraspinatus tendon. Associated extensive tearing of the subscapularis muscle back into the muscle belly. There is also fluid surrounding the muscle and a focal fluid collection which could be a liquified hematoma or ganglion cyst. The infraspinatus and subscapularis tendons are intact.  Muscles:  Significant supraspinatus muscle tear as above. The other shoulder muscles are intact.  Patient reports he is planning to have the shoulder repaired, will speak with surgeon on Nov 16.    Patient has a shower chair for bathing but can perform with modified independence.   UB dressing with difficulty at times especially with managing buttons.   Continued limitations:   yardwork [employer], sweeping, vacuuming Tool use  Treatment for ADL: Patient seen this date for UB dressing with button up shirt, able to complete with verbal cues this date.   Fine motor coordination skills with use of glass beads for coordination, manipulation, translatory skills of the hand, using the hand for storage.  Cues for prehension patterns.    Response to tx:   Patient with recent MRI which revealed a supraspinatus tear and patient reports he would like to have surgery.  He remains limited with ROM and strength in left shoulder but also demonstrates limitations in coordination skills of left hand from CVA.  He would benefit from skilled OT services to maximize safety and independence with necessary daily activities.                 OT Education - 12/09/19 1526    Education Details recertification, plan of care, goal update    Person(s) Educated Patient    Methods Explanation;Demonstration    Comprehension Verbalized understanding;Need further instruction;Returned demonstration;Verbal cues required               OT Long Term Goals - 11/28/19 1242      OT LONG TERM GOAL #1   Title Pt. will improve LUE strength by 1 muscle grade to assist with gardening, and yardwork.    Baseline 4/5 shoulder flexion, abduction. Pt. has difficulty performing yardwork    Time 12    Period Weeks    Status On-going      OT LONG TERM GOAL #2   Title Pt. will improve FOTO score by 2 points to maximize maximize independence with ADLs, and IADL functioning.    Baseline FOTO score: 59    Time 12     Period Weeks    Status On-going      OT LONG TERM GOAL #3   Title Pt will improve Left hand [facility] skills in order to be able to manipulate small objects    Baseline Left hand [facility] skills conitnue to be limited. pt. has difficulty manipulating small objects efficiently during ADLs, and IADLs.    Time  12    Period Weeks    Status On-going      OT LONG TERM GOAL #4   Title Pt. will imprive left hand motor control skills to be able to independently brush his hair with his LUE.    Baseline Pt. conitnues to have difficulty    Time 12    Period Weeks    Status On-going      OT LONG TERM GOAL #5   Title Pt. will use his left hand to be able to independently hold, and use a washcloth    Baseline Eval: Pt. has difficulty    Time 12    Period Weeks    Status On-going                 Plan - 12/09/19 1528    Clinical Impression Statement Patient with recent MRI which revealed a supraspinatus tear and patient reports he would like to have surgery.  He remains limited with ROM and strength in left shoulder but also demonstrates limitations in coordination skills of left hand from CVA.  He would benefit from skilled OT services to maximize safety and independence with necessary daily activities.    Occupational performance deficits (Please refer to evaluation for details): ADL's;IADL's    Body Structure / Function / Physical Skills ADL;IADL;Balance;Proprioception;Strength;Coordination;UE functional use;FMC;Dexterity    Rehab Potential Good    Clinical Decision Making Several treatment options, min-mod task modification necessary    Comorbidities Affecting Occupational Performance: May have comorbidities impacting occupational performance    Modification or Assistance to Complete Evaluation  Min-Moderate modification of tasks or assist with assess necessary to complete eval    OT Frequency 2x / week    OT Duration 12 weeks    OT Treatment/Interventions Self-care/ADL training;Therapeutic  exercise;DME and/or AE instruction;Patient/family education;Therapeutic activities;Energy conservation;Neuromuscular education    Consulted and Agree with Plan of Care Patient           Patient will benefit from skilled therapeutic intervention in order to improve the following deficits and impairments:   Body Structure / Function / Physical Skills: ADL, IADL, Balance, Proprioception, Strength, Coordination, UE functional use, FMC, Dexterity       Visit Diagnosis: Muscle weakness (generalized)  Other lack of coordination  Hemiparesis affecting left side as late effect of stroke ([facility])  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 [facility])    Problem List Patient Active Problem List   Diagnosis Date Noted  . Carotid stenosis, right 08/04/2019  . Carotid stenosis 07/29/2019  . Carotid artery plaque, right 07/19/2019  . Hx of completed stroke 07/19/2019  . AKI (acute kidney injury) ([place])   . Hemiparesis affecting left side as late effect of stroke ([place])   . Decreased GFR   . Dyslipidemia   . Stroke ([place]) 07/06/2019  . TIA (transient ischemic attack) 07/05/2019  . Hypokalemia 07/05/2019  . GERD without esophagitis 06/18/2017  . Essential hypertension 08/19/2016  . Vertigo 12/05/2015  . Adenomatous colon polyp 01/31/2014  . Basal cell carcinoma 09/23/2013  . Hay fever 09/23/2013  . Vitamin D deficiency 09/23/2013  . Gastric catarrh 11/28/2011  . Hyperlipidemia, mixed 11/28/2011  . Preop cardiovascular exam 11/28/2011   Achilles Dunk, OTR/L, CLT  Alonie Gazzola 12/09/2019, 4:31 PM  [place] MAIN [facility] SERVICES [address] [place], [place], [address] Phone: [phone]   Fax:  [phone]  Name: FARMER MCCAHILL MRN: [id number] Date of Birth: 02-29-1945

## 2019-12-09 NOTE — Addendum Note (Signed)
Addended by: Garlon Hatchet T on: 12/09/2019 04:38 PM   Modules accepted: Orders

## 2019-12-12 ENCOUNTER — Ambulatory Visit: Payer: Medicare Other | Admitting: Occupational Therapy

## 2019-12-12 ENCOUNTER — Encounter: Payer: Self-pay | Admitting: Occupational Therapy

## 2019-12-12 ENCOUNTER — Ambulatory Visit: Payer: Medicare Other | Admitting: Physical Therapy

## 2019-12-12 ENCOUNTER — Other Ambulatory Visit: Payer: Self-pay

## 2019-12-12 ENCOUNTER — Other Ambulatory Visit (INDEPENDENT_AMBULATORY_CARE_PROVIDER_SITE_OTHER): Payer: Self-pay | Admitting: Nurse Practitioner

## 2019-12-12 ENCOUNTER — Encounter: Payer: Self-pay | Admitting: Physical Therapy

## 2019-12-12 DIAGNOSIS — Z9889 Other specified postprocedural states: Secondary | ICD-10-CM

## 2019-12-12 DIAGNOSIS — I6309 Cerebral infarction due to thrombosis of other precerebral artery: Secondary | ICD-10-CM

## 2019-12-12 DIAGNOSIS — M6281 Muscle weakness (generalized): Secondary | ICD-10-CM

## 2019-12-12 DIAGNOSIS — R278 Other lack of coordination: Secondary | ICD-10-CM

## 2019-12-12 DIAGNOSIS — I69354 Hemiplegia and hemiparesis following cerebral infarction affecting left non-dominant side: Secondary | ICD-10-CM

## 2019-12-12 DIAGNOSIS — I6523 Occlusion and stenosis of bilateral carotid arteries: Secondary | ICD-10-CM

## 2019-12-12 NOTE — Therapy (Signed)
[place] MAIN [facility] SERVICES [address] [place], [place], [address] Phone: [phone]   Fax:  [phone]  Occupational Therapy Treatment  Patient Details  Name: Russell Herman MRN: [id number] Date of Birth: 05-09-1945 Referring Provider (OT): Dr. Letta Pate   Encounter Date: 12/12/2019   OT End of Session - 12/12/19 1116    Visit Number 17    Number of Visits 24    Date for OT Re-Evaluation 03/01/20    OT Start Time 1100    OT Stop Time 1145    OT Time Calculation (min) 45 min    Activity Tolerance Patient tolerated treatment well    Behavior During Therapy [facility] for tasks assessed/performed           Past Medical History:  Diagnosis Date  . Adenomatous colon polyp   . Allergy   . Arthritis   . Cancer ([place])   . Coronary artery disease   . GERD (gastroesophageal reflux disease)   . Hypertension   . Wears hearing aid in both ears     Past Surgical History:  Procedure Laterality Date  . cyst removed    . ENDARTERECTOMY Right 08/04/2019   Procedure: ENDARTERECTOMY CAROTID;  Surgeon: Algernon Huxley, MD;  Location: [facility];  Service: Vascular;  Laterality: Right;  . EXCISION OF TONGUE LESION Bilateral 05/05/2019   Procedure: EXCISION OF DORSAL TONGUE LESION;  Surgeon: Margaretha Sheffield, MD;  Location: [place];  Service: ENT;  Laterality: Bilateral;  . skin cancer removed        Subjective Assessment - 12/12/19 1145    Subjective  Pt. reports having surgery scheduled for 01/02/20    Pertinent History Pt. is a 74 y.o. male who was hospitalized with a CVA on June 1st, 2021. Imaging revealed a small acute infarct in right lateral thalamus, and mild small vessel disease. Pt. received inpatient rehab services, and home health services. Pt. underwent  a Right Carotid Endartectomy on 08/04/2019. Pt. resides with his wife, and has supportive family. Pt. is a retired Administrator for [employer], and enjoys gardening.    Currently in Pain? Yes     Pain Location Shoulder    Pain Orientation Left    Pain Descriptors / Indicators Aching    Pain Type Chronic pain    Pain Score 5    Pain Location Shoulder    Pain Orientation Left    Pain Descriptors / Indicators Aching           OT TREATMENT    Neuro muscular re-education:  Pt. worked on grasping, flipping, turning, and stacking [place] style discs with is left hand. Pt. required visual demonstration, and cues for movement patterns. Pt. worked on speed, and Soil scientist. Pt. worked on storing the the discs in the ulnar aspect of his hand while grasping them with his thumb, 2nd, and 3rd digits. Pt. Worked on grasping 1/2" small pegs with his left hand, and worked on translatory skills moving the pegs from his palm to the the tip of his 2nd digit, and thumb.  Pt. had an appointment with the orthopedic surgeon Dr. Posey Pronto this morning. Pt. reports that he is scheduled to have rotator cuff surgery on 11/29.  Pt. presents with 5/10 pain in the left shoulder. Pt. dropped multiple 1/2" small pegs from his hand when attempting to place them into the pegboard. Pt. continues to work on improving Left hand functioning in order to work towards improving, and maximizing independence with  ADLs, and IADLs.                      OT Education - 12/12/19 1116    Education Details recertification, plan of care, goal update    Person(s) Educated Patient    Methods Explanation;Demonstration    Comprehension Verbalized understanding;Need further instruction;Returned demonstration;Verbal cues required               OT Long Term Goals - 11/28/19 1242      OT LONG TERM GOAL #1   Title Pt. will improve LUE strength by 1 muscle grade to assist with gardening, and yardwork.    Baseline 4/5 shoulder flexion, abduction. Pt. has difficulty performing yardwork    Time 12    Period Weeks    Status On-going      OT LONG TERM GOAL #2   Title Pt. will improve FOTO score by 2  points to maximize maximize independence with ADLs, and IADL functioning.    Baseline FOTO score: 59    Time 12    Period Weeks    Status On-going      OT LONG TERM GOAL #3   Title Pt will improve Left hand [facility] skills in order to be able to manipulate small objects    Baseline Left hand [facility] skills conitnue to be limited. pt. has difficulty manipulating small objects efficiently during ADLs, and IADLs.    Time 12    Period Weeks    Status On-going      OT LONG TERM GOAL #4   Title Pt. will imprive left hand motor control skills to be able to independently brush his hair with his LUE.    Baseline Pt. conitnues to have difficulty    Time 12    Period Weeks    Status On-going      OT LONG TERM GOAL #5   Title Pt. will use his left hand to be able to independently hold, and use a washcloth    Baseline Eval: Pt. has difficulty    Time 12    Period Weeks    Status On-going                 Plan - 12/12/19 1117    Clinical Impression Statement Pt. had an appointment with the orthopedic surgeon Dr. Posey Pronto this morning. Pt. reports that he is scheduled to have rotator cuff surgery on 11/29.  Pt. presents with 5/10 pain in the left shoulder. Pt. dropped multiple 1/2" small pegs from his hand when attempting to place them into the pegboard. Pt. continues to work on improving Left hand functioning in order to work towards improving, and maximizing independence with ADLs, and IADLs.   OT Occupational Profile and History Problem Focused Assessment - Including review of records relating to presenting problem    Occupational performance deficits (Please refer to evaluation for details): ADL's;IADL's    Body Structure / Function / Physical Skills ADL;IADL;Balance;Proprioception;Strength;Coordination;UE functional use;FMC;Dexterity    Rehab Potential Good    Clinical Decision Making Several treatment options, min-mod task modification necessary    Comorbidities Affecting Occupational  Performance: May have comorbidities impacting occupational performance    Modification or Assistance to Complete Evaluation  Min-Moderate modification of tasks or assist with assess necessary to complete eval    OT Frequency 2x / week    OT Duration 12 weeks    OT Treatment/Interventions Self-care/ADL training;Therapeutic exercise;DME and/or AE instruction;Patient/family education;Therapeutic activities;Energy conservation;Neuromuscular education  Consulted and Agree with Plan of Care Patient           Patient will benefit from skilled therapeutic intervention in order to improve the following deficits and impairments:   Body Structure / Function / Physical Skills: ADL, IADL, Balance, Proprioception, Strength, Coordination, UE functional use, FMC, Dexterity       Visit Diagnosis: Muscle weakness (generalized)  Other lack of coordination    Problem List Patient Active Problem List   Diagnosis Date Noted  . Carotid stenosis, right 08/04/2019  . Carotid stenosis 07/29/2019  . Carotid artery plaque, right 07/19/2019  . Hx of completed stroke 07/19/2019  . AKI (acute kidney injury) ([place])   . Hemiparesis affecting left side as late effect of stroke ([place])   . Decreased GFR   . Dyslipidemia   . Stroke ([place]) 07/06/2019  . TIA (transient ischemic attack) 07/05/2019  . Hypokalemia 07/05/2019  . GERD without esophagitis 06/18/2017  . Essential hypertension 08/19/2016  . Vertigo 12/05/2015  . Adenomatous colon polyp 01/31/2014  . Basal cell carcinoma 09/23/2013  . Hay fever 09/23/2013  . Vitamin D deficiency 09/23/2013  . Gastric catarrh 11/28/2011  . Hyperlipidemia, mixed 11/28/2011  . Preop cardiovascular exam 11/28/2011    Harrel Carina, MS, OTR/L 12/12/2019, 11:55 AM  [place] MAIN [facility] SERVICES [address] [place], [place], [address] Phone: [phone]   Fax:  [phone]  Name: Russell Herman MRN: [id number] Date of  Birth: Sep 10, 1945

## 2019-12-12 NOTE — Therapy (Signed)
[place] MAIN [facility] SERVICES [address] [place], [place], [address] Phone: [phone]   Fax:  [phone]  Physical Therapy Treatment  Patient Details  Name: Russell Herman MRN: [id number] Date of Birth: October 05, 1945 No data recorded  Encounter Date: 12/12/2019   PT End of Session - 12/12/19 1205    Visit Number 19    Number of Visits 25    Date for PT Re-Evaluation 12/13/19           Past Medical History:  Diagnosis Date  . Adenomatous colon polyp   . Allergy   . Arthritis   . Cancer ([place])   . Coronary artery disease   . GERD (gastroesophageal reflux disease)   . Hypertension   . Wears hearing aid in both ears     Past Surgical History:  Procedure Laterality Date  . cyst removed    . ENDARTERECTOMY Right 08/04/2019   Procedure: ENDARTERECTOMY CAROTID;  Surgeon: Algernon Huxley, MD;  Location: [facility];  Service: Vascular;  Laterality: Right;  . EXCISION OF TONGUE LESION Bilateral 05/05/2019   Procedure: EXCISION OF DORSAL TONGUE LESION;  Surgeon: Margaretha Sheffield, MD;  Location: [place];  Service: ENT;  Laterality: Bilateral;  . skin cancer removed      There were no vitals filed for this visit.   Subjective Assessment - 12/12/19 1204    Subjective Pt reports 3/10 L shoulder pain , 4/ 5 L hip pain and 5/5 Left leg pain. No falls reported since last therapy session. Pt reports that he is having a R shoulder MRI this weekend as well as a repeat brain MRI because his provider is concerned about him having another "mini stroke" recently.    Pertinent History Right lateral thalamic infarct mild ataxia and mild hemiparesis. Patient went to rehab 07/07/19-07/15/19. Then he had surgery 7/1 for  s/p successful right CEA    Currently in Pain? Yes    Pain Score 3     Pain Location Shoulder    Pain Orientation Left    Pain Onset More than a month ago    Pain Onset 1 to 4 weeks ago             Neuromuscular Re-education  Rocker  board fwd/bwd, side to side x 20 each direction Tandem gait on floor without UE support x 20  Side stepping on foam without UE support x 20 Heel/toe raises without UE support 3s hold x 10 each Lateral side steps from foam to 6 inch stool left and right x 15 Backwards stepping from foam to 6 inch stool x 15  Four Square fwd/bwd, side to side , diagonal x 10 ,cues for posture and stepping strategies, occasional LOB       Pt educated throughout session about proper posture and technique with exercises. Improved exercise technique, movement at target joints, use of target muscles after min to mod verbal, visual, tactile cues. CGA and Min to mod verbal cues used throughout with increased in postural sway and LOB most seen with narrow base of support and while on uneven surfaces. Continues to have balance deficits typical with diagnosis.                        PT Education - 12/12/19 1205    Education Details HEP    Person(s) Educated Patient    Methods Explanation    Comprehension Verbalized understanding  PT Short Term Goals - 10/27/19 1110      PT SHORT TERM GOAL #1   Title Patient will increase lower extremity functional scale to >60/80 to demonstrate improved functional mobility and increased tolerance with ADLs.    Baseline 09/14/19= 43/80; 10/27/19: 49/80    Time 6    Period Weeks    Status Partially Met    Target Date 11/02/19      PT SHORT TERM GOAL #2   Title Patient will be modified independent in walking on even/uneven surface with least restrictive assistive device, for 20+ minutes without rest break, reporting some difficulty or less to improve walking tolerance with community ambulation including grocery shopping, going to church,etc.    Baseline Patient can ambulate for 10 minutes on level surfaces without incline and without AD, 10/27/19: Pt reports he can ambulate for 25 minutes;    Time 6    Period Weeks    Status Achieved    Target  Date 11/02/19             PT Long Term Goals - 12/06/19 1451      PT LONG TERM GOAL #1   Title Patient will increase BLE gross strength to 4+/5 as to improve functional strength for independent gait, increased standing tolerance and increased ADL ability.    Baseline LLE 3/5 hip abd, 3+/5 hip flex, knee flex/ext 4/5, ankle 4/5; 10/27/19: LLE: 3+/5 hip abd, 4/5 hip flex, 5/5 knee extension, 4+/5 knee flexion, 4+/5 ankle DF    Time 12    Status Partially Met    Target Date 02/29/20      PT LONG TERM GOAL #2   Title Patient will increase Berg Balance score by > 6 points to demonstrate decreased fall risk during functional activities.    Baseline 09/14/19= 42/56; 10/27/19: 51/56    Time 12    Period Weeks    Status Achieved    Target Date 12/07/19      PT LONG TERM GOAL #3   Title Patient will ascend/descend 4 stairs without rail assist independently without loss of balance to improve ability to get in/out of home.    Baseline 09/14/19= needs UE rail support and no AD; 10/27/19: needs single UE support with reciprocal pattern    Time 12    Period Weeks    Status Partially Met    Target Date 02/29/20      PT LONG TERM GOAL #4   Title Patient will be independent in home exercise program to improve strength/mobility for better functional independence with ADLs.    Time 12    Period Weeks    Status On-going    Target Date 02/29/20                 Plan - 12/12/19 1205    Clinical Impression Statement Patient instructed in beginning balance and coordination exercise. Patient required mod VCs and min A for gait to improve weight shift and postural control. Patient requires min VCs to improve ankle stability  with gait.  Patients would benefit from additional skilled PT intervention to improve balance/gait safety and reduce fall risk.   Personal Factors and Comorbidities Age;Comorbidity 1    Comorbidities HTN    Examination-Activity Limitations Locomotion Level;Stand     Examination-Participation Restrictions Driving;Yard Work;Meal Prep    Stability/Clinical Decision Making Stable/Uncomplicated    Rehab Potential Good    PT Frequency 2x / week    PT Duration 12 weeks  PT Treatment/Interventions Patient/family education;Balance training;Neuromuscular re-education;Therapeutic exercise;Stair training    PT Next Visit Plan balance and strengthening    PT Home Exercise Plan Medbridge Access Code: [email or web address]           Patient will benefit from skilled therapeutic intervention in order to improve the following deficits and impairments:  Decreased activity tolerance, Decreased strength, Difficulty walking, Decreased balance, Decreased coordination, Decreased mobility, Decreased endurance  Visit Diagnosis: Muscle weakness (generalized)  Other lack of coordination  Hemiparesis affecting left side as late effect of stroke [facility])  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 Carotid stenosis, right 08/04/2019  . Carotid stenosis 07/29/2019  . Carotid artery plaque, right 07/19/2019  . Hx of completed stroke 07/19/2019  . AKI (acute kidney injury) ([place])   . Hemiparesis affecting left side as late effect of stroke ([place])   . Decreased GFR   . Dyslipidemia   . Stroke ([place]) 07/06/2019  . TIA (transient ischemic attack) 07/05/2019  . Hypokalemia 07/05/2019  . GERD without esophagitis 06/18/2017  . Essential hypertension 08/19/2016  . Vertigo 12/05/2015  . Adenomatous colon polyp 01/31/2014  . Basal cell carcinoma 09/23/2013  . Hay fever 09/23/2013  . Vitamin D deficiency 09/23/2013  . Gastric catarrh 11/28/2011  . Hyperlipidemia, mixed 11/28/2011  . Preop cardiovascular exam 11/28/2011    Alanson Puls, [place] DPT 12/12/2019, 12:07 PM  [place] MAIN [facility] SERVICES [address] [place],  [place], [address] Phone: [phone]   Fax:  [phone]  Name: GLENVILLE ESPINA MRN: [id number] Date of Birth: Dec 06, 1945

## 2019-12-13 ENCOUNTER — Ambulatory Visit (INDEPENDENT_AMBULATORY_CARE_PROVIDER_SITE_OTHER): Payer: Medicare Other | Admitting: Vascular Surgery

## 2019-12-13 ENCOUNTER — Encounter (INDEPENDENT_AMBULATORY_CARE_PROVIDER_SITE_OTHER): Payer: Self-pay | Admitting: Vascular Surgery

## 2019-12-13 ENCOUNTER — Ambulatory Visit (INDEPENDENT_AMBULATORY_CARE_PROVIDER_SITE_OTHER): Payer: Medicare Other

## 2019-12-13 ENCOUNTER — Other Ambulatory Visit: Payer: Self-pay

## 2019-12-13 VITALS — BP 113/68 | HR 71 | Resp 18 | Ht 66.0 in | Wt 147.0 lb

## 2019-12-13 DIAGNOSIS — I6523 Occlusion and stenosis of bilateral carotid arteries: Secondary | ICD-10-CM

## 2019-12-13 DIAGNOSIS — I639 Cerebral infarction, unspecified: Secondary | ICD-10-CM

## 2019-12-13 DIAGNOSIS — Z9889 Other specified postprocedural states: Secondary | ICD-10-CM

## 2019-12-13 DIAGNOSIS — E782 Mixed hyperlipidemia: Secondary | ICD-10-CM

## 2019-12-13 DIAGNOSIS — I10 Essential (primary) hypertension: Secondary | ICD-10-CM | POA: Diagnosis not present

## 2019-12-13 DIAGNOSIS — I63331 Cerebral infarction due to thrombosis of right posterior cerebral artery: Secondary | ICD-10-CM

## 2019-12-13 DIAGNOSIS — I6521 Occlusion and stenosis of right carotid artery: Secondary | ICD-10-CM

## 2019-12-13 NOTE — Assessment & Plan Note (Signed)
Had a stroke preoperatively from his carotid disease.  No symptoms after the surgery.  Still improving.

## 2019-12-13 NOTE — Assessment & Plan Note (Signed)
blood pressure control important in reducing the progression of atherosclerotic disease. On appropriate oral medications.  

## 2019-12-13 NOTE — Assessment & Plan Note (Signed)
lipid control important in reducing the progression of atherosclerotic disease. Continue statin therapy  

## 2019-12-13 NOTE — Assessment & Plan Note (Signed)
His duplex today shows a widely patent right carotid endarterectomy and 1 to 39% left ICA stenosis.  He is doing well.  He is over 3 months status post his surgery so he can come off of Plavix for his upcoming shoulder surgery.  He actually does not have to resume it after surgery.  If he can stay on aspirin that would be helpful but if he has to be off of that I would like that resumed after his shoulder surgery.  I will see him back in 6 months with duplex.

## 2019-12-13 NOTE — Progress Notes (Signed)
MRN : [id number]  Russell Herman is a 74 y.o. (1946/01/21) male who presents with chief complaint of  Chief Complaint  Patient presents with  . Follow-up  .  History of Present Illness: Patient returns in follow-up of his carotid disease.  He is about 4 months status post right carotid endarterectomy for high-grade stenosis with stroke.  He had a stroke about 4 weeks before surgery and still has residual left-sided weakness.  Overall he is doing well.  He is slowly improving.  His biggest complaint now is of left shoulder pain and he needs surgery coming up next month.  His duplex today shows a widely patent right carotid endarterectomy and 1 to 39% left ICA stenosis.  Current Outpatient Medications  Medication Sig Dispense Refill  . aspirin 81 MG EC tablet Take 1 tablet (81 mg total) by mouth daily. 90 tablet 3  . chlorthalidone (HYGROTON) 25 MG tablet Take by mouth.    . Cholecalciferol (VITAMIN D-3) 25 MCG (1000 UT) CAPS Take 1 capsule (1,000 Units total) by mouth daily at 12 noon. 90 capsule 3  . clopidogrel (PLAVIX) 75 MG tablet Take 1 tablet (75 mg total) by mouth daily. 90 tablet 3  . fluticasone (FLONASE) 50 MCG/ACT nasal spray Place 2 sprays into both nostrils daily.   4  . pantoprazole (PROTONIX) 40 MG tablet Take 1 tablet (40 mg total) by mouth daily. 90 tablet 3  . psyllium (METAMUCIL) 58.6 % packet Take by mouth.    . rosuvastatin (CRESTOR) 40 MG tablet Take 1 tablet (40 mg total) by mouth at bedtime. 90 tablet 3  . diphenhydrAMINE (BENADRYL) 25 MG tablet Take 25 mg by mouth every 6 (six) hours as needed. (Patient not taking: Reported on 12/13/2019)    . Docusate Calcium (STOOL SOFTENER PO) Take by mouth as needed.  (Patient not taking: Reported on 12/13/2019)     No current facility-administered medications for this visit.    Past Medical History:  Diagnosis Date  . Adenomatous colon polyp   . Allergy   . Arthritis   . Cancer ([place])   . Coronary artery disease   . GERD  (gastroesophageal reflux disease)   . Hypertension   . Wears hearing aid in both ears     Past Surgical History:  Procedure Laterality Date  . cyst removed    . ENDARTERECTOMY Right 08/04/2019   Procedure: ENDARTERECTOMY CAROTID;  Surgeon: Algernon Huxley, MD;  Location: [facility];  Service: Vascular;  Laterality: Right;  . EXCISION OF TONGUE LESION Bilateral 05/05/2019   Procedure: EXCISION OF DORSAL TONGUE LESION;  Surgeon: Margaretha Sheffield, MD;  Location: [place];  Service: ENT;  Laterality: Bilateral;  . skin cancer removed       Social History   Tobacco Use  . Smoking status: Never Smoker  . Smokeless tobacco: Never Used  . Tobacco comment: smoked "some" as teenager  Vaping Use  . Vaping Use: Never used  Substance Use Topics  . Alcohol use: Not Currently    Alcohol/week: 0.0 standard drinks  . Drug use: Not Currently      Family History  Problem Relation Age of Onset  . Breast cancer Mother   . Melanoma Father        mets to lung  . Breast cancer Sister   . Kidney failure Brother        s/p transplant     Allergies  Allergen Reactions  . Losartan Other (See Comments)  rash  . Chlorthalidone Nausea And Vomiting    Can tolerate if taking with Omeprazole  . Keflex [Cephalexin]     "minor reaction"  . Statins Rash    Myalgias, muscle pain/weakness  . Sulfa Antibiotics Rash  . Sulfamethoxazole-Trimethoprim Rash     REVIEW OF SYSTEMS (Negative unless checked)  Constitutional: [] Weight loss  [] Fever  [] Chills Cardiac: [] Chest pain   [] Chest pressure   [] Palpitations   [] Shortness of breath when laying flat   [] Shortness of breath at rest   [] Shortness of breath with exertion. Vascular:  [] Pain in legs with walking   [] Pain in legs at rest   [] Pain in legs when laying flat   [] Claudication   [] Pain in feet when walking  [] Pain in feet at rest  [] Pain in feet when laying flat   [] History of DVT   [] Phlebitis   [] Swelling in legs   [] Varicose veins    [] Non-healing ulcers Pulmonary:   [] Uses home oxygen   [] Productive cough   [] Hemoptysis   [] Wheeze  [] COPD   [] Asthma Neurologic:  [] Dizziness  [] Blackouts   [] Seizures   [x] History of stroke   [] History of TIA  [] Aphasia   [] Temporary blindness   [] Dysphagia   [x] Weakness or numbness in arms   [x] Weakness or numbness in legs Musculoskeletal:  [x] Arthritis   [] Joint swelling   [x] Joint pain   [] Low back pain Hematologic:  [] Easy bruising  [] Easy bleeding   [] Hypercoagulable state   [] Anemic  [] Hepatitis Gastrointestinal:  [] Blood in stool   [] Vomiting blood  [] Gastroesophageal reflux/heartburn   [] Difficulty swallowing. Genitourinary:  [] Chronic kidney disease   [] Difficult urination  [] Frequent urination  [] Burning with urination   [] Blood in urine Skin:  [] Rashes   [] Ulcers   [] Wounds Psychological:  [] History of anxiety   []  History of major depression.  Physical Examination  Vitals:   12/13/19 1049  BP: 113/68  Pulse: 71  Resp: 18  Weight: 147 lb (66.7 kg)  Height: 5\' 6"  (1.676 m)   Body mass index is 23.73 kg/m. Gen:  WD/WN, NAD Head: [place]/AT, No temporalis wasting. Ear/Nose/Throat: Hearing grossly intact, nares w/o erythema or drainage, trachea midline Eyes: Conjunctiva clear. Sclera non-icteric Neck: Supple.  No bruit  Pulmonary:  Good air movement, equal and clear to auscultation bilaterally.  Cardiac: RRR, No JVD Vascular:  Vessel Right Left  Radial Palpable Palpable           Musculoskeletal: some left sided weakness persists but improving.  No deformity or atrophy.  Neurologic: CN 2-12 intact. Sensation grossly intact in extremities.    Speech is fluent. Motor exam as listed above. Psychiatric: Judgment intact, Mood & affect appropriate for pt's clinical situation. Dermatologic: Right CEA incision well healed.      CBC Lab Results  Component Value Date   WBC 22.2 (H) 08/05/2019   HGB 13.8 08/05/2019   HCT 39.3 08/05/2019   MCV 84.2 08/05/2019   PLT 336  08/05/2019    BMET    Component Value Date/Time   NA 135 08/05/2019 0500   K 4.2 08/05/2019 0500   CL 103 08/05/2019 0500   CO2 19 (L) 08/05/2019 0500   GLUCOSE 150 (H) 08/05/2019 0500   BUN 19 08/05/2019 0500   CREATININE 0.94 08/05/2019 0500   CALCIUM 8.8 (L) 08/05/2019 0500   GFRNONAA >60 08/05/2019 0500   GFRAA >60 08/05/2019 0500   CrCl cannot be calculated (Patient's most recent lab result is older than the maximum 21 days allowed.).  COAG Lab Results  Component Value Date   INR 1.1 08/03/2019   INR 1.1 07/05/2019    Radiology MR BRAIN WO CONTRAST  Result Date: 12/03/2019 CLINICAL DATA:  Transient ischemic attacks.  Confusion. EXAM: MRI HEAD WITHOUT CONTRAST TECHNIQUE: Multiplanar, multiecho pulse sequences of the brain and surrounding structures were obtained without intravenous contrast. COMPARISON:  07/05/2019 MRI head and prior.  07/05/2019 head CT. FINDINGS: Brain: No diffusion-weighted signal abnormality. No intracranial hemorrhage. No midline shift, ventriculomegaly or extra-axial fluid collection. No mass lesion. Mild cerebral atrophy with ex vacuo dilatation. Mild chronic microvascular ischemic changes, similar to prior exam. Sequela of remote lacunar insults involving the right thalamus. Vascular: Normal flow voids. Skull and upper cervical spine: Normal marrow signal. Sinuses/Orbits: Sequela of bilateral lens replacement. Clear paranasal sinuses and mastoid air cells. Other: None. IMPRESSION: No acute intracranial process. Remote right thalamic lacunar insults. Mild cerebral atrophy.  Mild chronic microvascular ischemic changes. Electronically Signed   By: Primitivo Gauze M.D.   On: 12/03/2019 19:43   MR SHOULDER LEFT WO CONTRAST  Result Date: 12/04/2019 CLINICAL DATA:  Left shoulder pain for 1-2 months. No specific injury. EXAM: MRI OF THE LEFT SHOULDER WITHOUT CONTRAST TECHNIQUE: Multiplanar, multisequence MR imaging of the shoulder was performed. No  intravenous contrast was administered. COMPARISON:  None. FINDINGS: Rotator cuff: Significant tendinopathy the and partial-thickness bursal surface tears along the musculotendinous junction region of the supraspinatus tendon. Associated extensive tearing of the subscapularis muscle back into the muscle belly. There is also fluid surrounding the muscle and a focal fluid collection which could be a liquified hematoma or ganglion cyst. The infraspinatus and subscapularis tendons are intact. Muscles: Significant supraspinatus muscle tear as above. The other shoulder muscles are intact. Biceps long head:  Intact Acromioclavicular Joint: Moderate degenerative changes. Type 2 acromion. No significant lateral downsloping or subacromial spurring. Glenohumeral Joint: Mild degenerative changes. Small joint effusion and mild synovitis. Labrum:  No definite labral tears. Bones:  No acute bony findings. Other: Moderate subacromial/subdeltoid bursitis. IMPRESSION: 1. Significant tendinopathy with interstitial tears and partial-thickness bursal surface tears along the musculotendinous junction region of the supraspinatus tendon. 2. Associated extensive tearing of the subscapularis muscle back into the muscle belly. There is also fluid surrounding the muscle and a focal fluid collection which could be a liquified hematoma or ganglion cyst. 3. Intact long head biceps tendon and glenoid labrum. 4. Moderate AC joint degenerative changes but no other significant findings for bony impingement. 5. Moderate subacromial/subdeltoid bursitis. Electronically Signed   By: Marijo Sanes M.D.   On: 12/04/2019 09:41     Assessment/Plan Carotid stenosis His duplex today shows a widely patent right carotid endarterectomy and 1 to 39% left ICA stenosis.  He is doing well.  He is over 3 months status post his surgery so he can come off of Plavix for his upcoming shoulder surgery.  He actually does not have to resume it after surgery.  If he can  stay on aspirin that would be helpful but if he has to be off of that I would like that resumed after his shoulder surgery.  I will see him back in 6 months with duplex.  Essential hypertension blood pressure control important in reducing the progression of atherosclerotic disease. On appropriate oral medications.   Stroke [facility]) Had a stroke preoperatively from his carotid disease.  No symptoms after the surgery.  Still improving.  Hyperlipidemia, mixed lipid control important in reducing the progression of atherosclerotic disease. Continue statin therapy  Leotis Pain, MD  12/13/2019 11:44 AM    This note was created with Dragon medical transcription system.  Any errors from dictation are purely unintentional

## 2019-12-14 ENCOUNTER — Other Ambulatory Visit: Payer: Self-pay | Admitting: Orthopedic Surgery

## 2019-12-15 ENCOUNTER — Encounter: Payer: Self-pay | Admitting: Physical Therapy

## 2019-12-15 ENCOUNTER — Other Ambulatory Visit: Payer: Self-pay

## 2019-12-15 ENCOUNTER — Ambulatory Visit: Payer: Medicare Other | Admitting: Physical Therapy

## 2019-12-15 ENCOUNTER — Encounter: Payer: Self-pay | Admitting: Occupational Therapy

## 2019-12-15 ENCOUNTER — Ambulatory Visit: Payer: Medicare Other | Admitting: Occupational Therapy

## 2019-12-15 DIAGNOSIS — R278 Other lack of coordination: Secondary | ICD-10-CM

## 2019-12-15 DIAGNOSIS — I69354 Hemiplegia and hemiparesis following cerebral infarction affecting left non-dominant side: Secondary | ICD-10-CM

## 2019-12-15 DIAGNOSIS — M6281 Muscle weakness (generalized): Secondary | ICD-10-CM

## 2019-12-15 DIAGNOSIS — I6309 Cerebral infarction due to thrombosis of other precerebral artery: Secondary | ICD-10-CM

## 2019-12-15 NOTE — Therapy (Signed)
[place] MAIN [facility] SERVICES [address] [place], [place], [address] Phone: [phone]   Fax:  [phone]  Occupational Therapy Treatment  Patient Details  Name: Russell Herman MRN: [id number] Date of Birth: 03-20-1945 Referring Provider (OT): Dr. Letta Pate   Encounter Date: 12/15/2019   OT End of Session - 12/19/19 1435    Visit Number 18    Number of Visits 24    Date for OT Re-Evaluation 03/01/20    OT Start Time 1100    OT Stop Time 1145    OT Time Calculation (min) 45 min    Activity Tolerance Patient tolerated treatment well    Behavior During Therapy [facility] for tasks assessed/performed           Past Medical History:  Diagnosis Date  . Adenomatous colon polyp   . Allergy   . Arthritis   . Cancer ([place])   . Coronary artery disease   . GERD (gastroesophageal reflux disease)   . Hypertension   . Wears hearing aid in both ears     Past Surgical History:  Procedure Laterality Date  . cyst removed    . ENDARTERECTOMY Right 08/04/2019   Procedure: ENDARTERECTOMY CAROTID;  Surgeon: Algernon Huxley, MD;  Location: [facility];  Service: Vascular;  Laterality: Right;  . EXCISION OF TONGUE LESION Bilateral 05/05/2019   Procedure: EXCISION OF DORSAL TONGUE LESION;  Surgeon: Margaretha Sheffield, MD;  Location: [place];  Service: ENT;  Laterality: Bilateral;  . skin cancer removed      There were no vitals filed for this visit.   Subjective Assessment - 12/19/19 1435    Subjective  Pt. reports that his shoulder surgery date was moved to Dec. 6th.    Patient is accompanied by: Family member    Pertinent History Pt. is a 74 y.o. male who was hospitalized with a CVA on June 1st, 2021. Imaging revealed a small acute infarct in right lateral thalamus, and mild small vessel disease. Pt. received inpatient rehab services, and home health services. Pt. underwent  a Right Carotid Endartectomy on 08/04/2019. Pt. resides with his wife, and has  supportive family. Pt. is a retired Administrator for [employer], and enjoys gardening.    Patient Stated Goals To be as independent as possible.    Pain Score 4     Pain Location Shoulder    Pain Orientation Left    Pain Descriptors / Indicators Aching          Neuro:  Patient seen this date for manipulation of small 1/2 inch sized Snap beads to pick up and place together with cues for prehension patterns with tip to tip pinch, multiple reps completed to make a long string of beads and then remove, working on hand/finger strength as well.  Knotting/unknotting of medium nylon rope with use of bilateral hands for task but leading more with left hand to unknot.   Use of chinese balls, able to perform counterclockwise but difficulty noted for clockwise position, multiple reps completed of each direction with left hand.    Coins from tabletop picking up from flat surface, utilizing translatory skills of the left hand to move the coins to the palm and then using the hand for storage. Pt then moving coins one at a time to fingertip and place into resistive bank.    Response to tx:   Patient scheduled to have surgery at the end of the month for his left shoulder for rotator  cuff injuries, still has some difficulty with coordination and manipulation skills from CVA.  Continues to work on speed and dexterity of tasks to improve hand function skills.  Benefits from occasional therapist demonstration and verbal cues.  Difficulty with chinese balls to go in clockwise direction.                        OT Education - 12/19/19 1435    Education Details coordination skills, fine motor    Person(s) Educated Patient    Methods Explanation;Demonstration    Comprehension Verbalized understanding;Need further instruction;Returned demonstration;Verbal cues required               OT Long Term Goals - 11/28/19 1242      OT LONG TERM GOAL #1   Title Pt. will improve LUE strength by 1 muscle  grade to assist with gardening, and yardwork.    Baseline 4/5 shoulder flexion, abduction. Pt. has difficulty performing yardwork    Time 12    Period Weeks    Status On-going      OT LONG TERM GOAL #2   Title Pt. will improve FOTO score by 2 points to maximize maximize independence with ADLs, and IADL functioning.    Baseline FOTO score: 59    Time 12    Period Weeks    Status On-going      OT LONG TERM GOAL #3   Title Pt will improve Left hand [facility] skills in order to be able to manipulate small objects    Baseline Left hand [facility] skills conitnue to be limited. pt. has difficulty manipulating small objects efficiently during ADLs, and IADLs.    Time 12    Period Weeks    Status On-going      OT LONG TERM GOAL #4   Title Pt. will imprive left hand motor control skills to be able to independently brush his hair with his LUE.    Baseline Pt. conitnues to have difficulty    Time 12    Period Weeks    Status On-going      OT LONG TERM GOAL #5   Title Pt. will use his left hand to be able to independently hold, and use a washcloth    Baseline Eval: Pt. has difficulty    Time 12    Period Weeks    Status On-going                 Plan - 12/19/19 1436    Clinical Impression Statement Patient scheduled to have surgery at the end of the month for his left shoulder for rotator cuff injuries, still has some difficulty with coordination and manipulation skills from CVA.  Continues to work on speed and dexterity of tasks to improve hand function skills.  Benefits from occasional therapist demonstration and verbal cues.  Difficulty with chinese balls to go in clockwise direction.    OT Occupational Profile and History Problem Focused Assessment - Including review of records relating to presenting problem    Occupational performance deficits (Please refer to evaluation for details): ADL's;IADL's    Body Structure / Function / Physical Skills  ADL;IADL;Balance;Proprioception;Strength;Coordination;UE functional use;FMC;Dexterity    Rehab Potential Good    Clinical Decision Making Several treatment options, min-mod task modification necessary    Comorbidities Affecting Occupational Performance: May have comorbidities impacting occupational performance    Modification or Assistance to Complete Evaluation  Min-Moderate modification of tasks or assist with assess necessary to  complete eval    OT Frequency 2x / week    OT Duration 12 weeks    OT Treatment/Interventions Self-care/ADL training;Therapeutic exercise;DME and/or AE instruction;Patient/family education;Therapeutic activities;Energy conservation;Neuromuscular education    Consulted and Agree with Plan of Care Patient           Patient will benefit from skilled therapeutic intervention in order to improve the following deficits and impairments:   Body Structure / Function / Physical Skills: ADL, IADL, Balance, Proprioception, Strength, Coordination, UE functional use, FMC, Dexterity       Visit Diagnosis: Other lack of coordination  Muscle weakness (generalized)  Hemiparesis affecting left side as late effect of stroke [facility])    Problem List Patient Active Problem List   Diagnosis Date Noted  . History of stroke with residual effects 11/17/2019  . Right shoulder pain 10/25/2019  . Weakness 10/25/2019  . Decreased GFR 08/15/2019  . Dyslipidemia 08/15/2019  . AKI (acute kidney injury) ([place]) 08/15/2019  . Hemiparesis affecting left side as late effect of stroke ([place]) 08/15/2019  . Carotid stenosis 07/29/2019  . Carotid stenosis, right 07/29/2019  . Carotid artery plaque, right 07/19/2019  . Hx of completed stroke 07/19/2019  . Stroke ([place]) 07/06/2019  . TIA (transient ischemic attack) 07/05/2019  . Hypokalemia 07/05/2019  . GERD without esophagitis 06/18/2017  . Essential hypertension 08/19/2016  . Vertigo 12/05/2015  . Adenomatous colon polyp 01/31/2014  .  Basal cell carcinoma 09/23/2013  . Hay fever 09/23/2013  . Vitamin D deficiency 09/23/2013  . Gastric catarrh 11/28/2011  . Hyperlipidemia, mixed 11/28/2011  . Preop cardiovascular exam 11/28/2011   Achilles Dunk, OTR/L, CLT  Andreea Arca 12/20/2019, 2:38 PM  [place] MAIN [facility] SERVICES [address] [place], [place], [address] Phone: [phone]   Fax:  [phone]  Name: Russell Herman MRN: [id number] Date of Birth: [birth year]

## 2019-12-15 NOTE — Therapy (Addendum)
[place] MAIN [facility] SERVICES [address] [place], [place], [address] Phone: [phone]   Fax:  [phone]  Physical Therapy Treatment Physical Therapy Progress Note   Dates of reporting period 10/27/19    to 12/15/19  Patient Details  Name: Russell Herman MRN: [id number] Date of Birth: 1945-11-13 No data recorded  Encounter Date: 12/15/2019    Past Medical History:  Diagnosis Date  . Adenomatous colon polyp   . Allergy   . Arthritis   . Cancer ([place])   . Coronary artery disease   . GERD (gastroesophageal reflux disease)   . Hypertension   . Wears hearing aid in both ears     Past Surgical History:  Procedure Laterality Date  . cyst removed    . ENDARTERECTOMY Right 08/04/2019   Procedure: ENDARTERECTOMY CAROTID;  Surgeon: Algernon Huxley, MD;  Location: [facility];  Service: Vascular;  Laterality: Right;  . EXCISION OF TONGUE LESION Bilateral 05/05/2019   Procedure: EXCISION OF DORSAL TONGUE LESION;  Surgeon: Margaretha Sheffield, MD;  Location: [place];  Service: ENT;  Laterality: Bilateral;  . skin cancer removed      There were no vitals filed for this visit.   Subjective Assessment - 12/15/19 1158    Subjective Pt reports 3/10 L shoulder pain , 4/ 5 L hip pain and 5/5 Left leg pain. No falls reported since last therapy session. Pt reports that he is having a R shoulder MRI this weekend as well as a repeat brain MRI because his provider is concerned about him having another "mini stroke" recently.    Pertinent History Right lateral thalamic infarct mild ataxia and mild hemiparesis. Patient went to rehab 07/07/19-07/15/19. Then he had surgery 7/1 for  s/p successful right CEA    Currently in Pain? Yes    Pain Score 5     Pain Location Knee    Pain Orientation Left    Pain Descriptors / Indicators Aching    Pain Type Chronic pain    Pain Onset More than a month ago    Pain Frequency Constant    Pain Onset 1 to 4 weeks ago            Therapeutic exercise: Octane fitness x 5 mins L 4   Supine:2 lbs : SLR x 15 BLE Hookling marching x 15  Hooklying abd/ER x 15  Bridging x 15 SAQ x 15 BLE Hip abd/add x 15, BLE Heel slides x 15, BLE  Sidelying: Hip abd x 15 , BLE  Patient performed with instruction, verbal cues, tactile cues of therapist: goal: increase tissue extensibility, promote proper posture, improve mobility                            PT Education - 12/15/19 1159    Education Details HEP    Person(s) Educated Patient    Methods Explanation    Comprehension Verbalized understanding            PT Short Term Goals - 10/27/19 1110      PT SHORT TERM GOAL #1   Title Patient will increase lower extremity functional scale to >60/80 to demonstrate improved functional mobility and increased tolerance with ADLs.    Baseline 09/14/19= 43/80; 10/27/19: 49/80    Time 6    Period Weeks    Status Partially Met    Target Date 11/02/19      PT SHORT TERM  GOAL #2   Title Patient will be modified independent in walking on even/uneven surface with least restrictive assistive device, for 20+ minutes without rest break, reporting some difficulty or less to improve walking tolerance with community ambulation including grocery shopping, going to church,etc.    Baseline Patient can ambulate for 10 minutes on level surfaces without incline and without AD, 10/27/19: Pt reports he can ambulate for 25 minutes;    Time 6    Period Weeks    Status Achieved    Target Date 11/02/19             PT Long Term Goals - 12/06/19 1451      PT LONG TERM GOAL #1   Title Patient will increase BLE gross strength to 4+/5 as to improve functional strength for independent gait, increased standing tolerance and increased ADL ability.    Baseline LLE 3/5 hip abd, 3+/5 hip flex, knee flex/ext 4/5, ankle 4/5; 10/27/19: LLE: 3+/5 hip abd, 4/5 hip flex, 5/5 knee extension, 4+/5 knee flexion, 4+/5 ankle DF    Time  12    Status Partially Met    Target Date 02/29/20      PT LONG TERM GOAL #2   Title Patient will increase Berg Balance score by > 6 points to demonstrate decreased fall risk during functional activities.    Baseline 09/14/19= 42/56; 10/27/19: 51/56    Time 12    Period Weeks    Status Achieved    Target Date 12/07/19      PT LONG TERM GOAL #3   Title Patient will ascend/descend 4 stairs without rail assist independently without loss of balance to improve ability to get in/out of home.    Baseline 09/14/19= needs UE rail support and no AD; 10/27/19: needs single UE support with reciprocal pattern    Time 12    Period Weeks    Status Partially Met    Target Date 02/29/20      PT LONG TERM GOAL #4   Title Patient will be independent in home exercise program to improve strength/mobility for better functional independence with ADLs.    Time 12    Period Weeks    Status On-going    Target Date 02/29/20                 Plan - 12/15/19 1159    Clinical Impression Statement Patient's condition has the potential to improve in response to therapy. Maximum improvement is yet to be obtained. The anticipated improvement is attainable and reasonable in a generally predictable time.  Patient reports that his functional mobility depends on how active he is during the day. His outcome measures improved and his goals were reviewed and progressed. Patient will continue to benefit from skilled PT to improve mobility. Pt was able to perform all exercises today with CGA.Marland Kitchen Pt was able to perform  strength exercises, demonstrating improvements in LE strength and stability.  Pt requires verbal, visual and tactile cues during exercise in order to complete tasks with proper form and technique.  Pt would continue to benefit from skilled PT services in order to further strengthen LE's, improve static and dynamic balance, and improve coordination in order to increase functional mobility and decrease risk of falls    Personal Factors and Comorbidities Age;Comorbidity 1    Comorbidities HTN    Examination-Activity Limitations Locomotion Level;Stand    Examination-Participation Restrictions Driving;Yard Work;Meal Prep    Stability/Clinical Decision Making Stable/Uncomplicated    Rehab Potential  Good    PT Frequency 2x / week    PT Duration 12 weeks    PT Treatment/Interventions Patient/family education;Balance training;Neuromuscular re-education;Therapeutic exercise;Stair training    PT Next Visit Plan balance and strengthening    PT Home Exercise Plan Medbridge Access Code: [email or web address]           Patient will benefit from skilled therapeutic intervention in order to improve the following deficits and impairments:  Decreased activity tolerance, Decreased strength, Difficulty walking, Decreased balance, Decreased coordination, Decreased mobility, Decreased endurance  Visit Diagnosis: Muscle weakness (generalized)  Other lack of coordination  Hemiparesis affecting left side as late effect of stroke [facility])  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 [facility])     Problem List Patient Active Problem List   Diagnosis Date Noted  . History of stroke with residual effects 11/17/2019  . Right shoulder pain 10/25/2019  . Weakness 10/25/2019  . Decreased GFR 08/15/2019  . Dyslipidemia 08/15/2019  . AKI (acute kidney injury) ([place]) 08/15/2019  . Hemiparesis affecting left side as late effect of stroke ([place]) 08/15/2019  . Carotid stenosis 07/29/2019  . Carotid stenosis, right 07/29/2019  . Carotid artery plaque, right 07/19/2019  . Hx of completed stroke 07/19/2019  . Stroke ([place]) 07/06/2019  . TIA (transient ischemic attack) 07/05/2019  . Hypokalemia 07/05/2019  . GERD without esophagitis 06/18/2017  . Essential hypertension 08/19/2016  . Vertigo 12/05/2015  . Adenomatous colon polyp 01/31/2014  . Basal cell carcinoma 09/23/2013  . Hay fever 09/23/2013  . Vitamin  D deficiency 09/23/2013  . Gastric catarrh 11/28/2011  . Hyperlipidemia, mixed 11/28/2011  . Preop cardiovascular exam 11/28/2011    Alanson Puls, [place] DPT 12/15/2019, 12:00 PM  [place] MAIN [facility] SERVICES [address] [place], [place], [address] Phone: [phone]   Fax:  [phone]  Name: Russell Herman MRN: [id number] Date of Birth: [birth year]

## 2019-12-20 ENCOUNTER — Ambulatory Visit: Payer: Medicare Other | Admitting: Occupational Therapy

## 2019-12-20 ENCOUNTER — Encounter: Payer: Self-pay | Admitting: Occupational Therapy

## 2019-12-20 ENCOUNTER — Other Ambulatory Visit: Payer: Self-pay

## 2019-12-20 ENCOUNTER — Ambulatory Visit: Payer: Medicare Other | Admitting: Physical Therapy

## 2019-12-20 ENCOUNTER — Encounter: Payer: Self-pay | Admitting: Physical Therapy

## 2019-12-20 DIAGNOSIS — M6281 Muscle weakness (generalized): Secondary | ICD-10-CM | POA: Diagnosis not present

## 2019-12-20 DIAGNOSIS — I6309 Cerebral infarction due to thrombosis of other precerebral artery: Secondary | ICD-10-CM

## 2019-12-20 DIAGNOSIS — R278 Other lack of coordination: Secondary | ICD-10-CM

## 2019-12-20 DIAGNOSIS — I69354 Hemiplegia and hemiparesis following cerebral infarction affecting left non-dominant side: Secondary | ICD-10-CM

## 2019-12-20 NOTE — Therapy (Signed)
[facility] MAIN REHAB SERVICES [address] [place], [place], [address] Phone: [phone]   Fax:  [phone]  Physical Therapy Treatment  Patient Details  Name: Russell Herman MRN: [id number] Date of Birth: 11/22/1945 No data recorded  Encounter Date: 12/20/2019   PT End of Session - 12/20/19 1302    Visit Number 21    Number of Visits 25    Date for PT Re-Evaluation 03/01/19    PT Start Time 1145    PT Stop Time 1230    PT Time Calculation (min) 45 min    Equipment Utilized During Treatment Gait belt    Activity Tolerance Patient tolerated treatment well;No increased pain    Behavior During Therapy WFL for tasks assessed/performed           Past Medical History:  Diagnosis Date  . Adenomatous colon polyp   . Allergy   . Arthritis   . Cancer ([facility])   . Coronary artery disease   . GERD (gastroesophageal reflux disease)   . Hypertension   . Wears hearing aid in both ears     Past Surgical History:  Procedure Laterality Date  . cyst removed    . ENDARTERECTOMY Right 08/04/2019   Procedure: ENDARTERECTOMY CAROTID;  Surgeon: Dew, Jason S, MD;  Location: [facility];  Service: Vascular;  Laterality: Right;  . EXCISION OF TONGUE LESION Bilateral 05/05/2019   Procedure: EXCISION OF DORSAL TONGUE LESION;  Surgeon: Juengel, Paul, MD;  Location: [facility];  Service: ENT;  Laterality: Bilateral;  . skin cancer removed      There were no vitals filed for this visit.   Subjective Assessment - 12/20/19 1301    Subjective Pt reports 3/10 L shoulder pain , 4/ 5 L hip pain and 5/5 Left leg pain. No falls reported since last therapy session. Pt reports that he is having a R shoulder MRI this weekend as well as a repeat brain MRI because his provider is concerned about him having another "mini stroke" recently.    Pertinent History Right lateral thalamic infarct mild ataxia and mild hemiparesis. Patient went to rehab 07/07/19-07/15/19. Then he  had surgery 7/1 for  s/p successful right CEA    Currently in Pain? Yes    Pain Score 5     Pain Location Shoulder    Pain Orientation Left    Pain Descriptors / Indicators Aching    Pain Onset More than a month ago    Pain Frequency Constant    Aggravating Factors  using his harm    Pain Relieving Factors rest    Multiple Pain Sites No    Pain Onset 1 to 4 weeks ago            Treatment:Therapeutic exercise: Octane fitness x 5 mins L 4  Supine: 2 lbs SLR x 15 BLE Hookling marching x 15  Hooklying abd/ER x 15  Bridging x 15 SAQ x 15 BLE Hip abd/add x 15, BLE Heel slides x 15, BLE  Sidelying: Hip abd x 15 , BLE    Patient performed with instruction, verbal cues, tactile cues of therapist: goal: increase tissue extensibility, promote proper posture, improve mobility                        PT Education - 12/20/19 1302    Education Details HEP    Person(s) Educated Patient    Methods Explanation    Comprehension Verbalized understanding              PT Short Term Goals - 10/27/19 1110      PT SHORT TERM GOAL #1   Title Patient will increase lower extremity functional scale to >60/80 to demonstrate improved functional mobility and increased tolerance with ADLs.    Baseline 09/14/19= 43/80; 10/27/19: 49/80    Time 6    Period Weeks    Status Partially Met    Target Date 11/02/19      PT SHORT TERM GOAL #2   Title Patient will be modified independent in walking on even/uneven surface with least restrictive assistive device, for 20+ minutes without rest break, reporting some difficulty or less to improve walking tolerance with community ambulation including grocery shopping, going to church,etc.    Baseline Patient can ambulate for 10 minutes on level surfaces without incline and without AD, 10/27/19: Pt reports he can ambulate for 25 minutes;    Time 6    Period Weeks    Status Achieved    Target Date 11/02/19             PT Long Term Goals  - 12/06/19 1451      PT LONG TERM GOAL #1   Title Patient will increase BLE gross strength to 4+/5 as to improve functional strength for independent gait, increased standing tolerance and increased ADL ability.    Baseline LLE 3/5 hip abd, 3+/5 hip flex, knee flex/ext 4/5, ankle 4/5; 10/27/19: LLE: 3+/5 hip abd, 4/5 hip flex, 5/5 knee extension, 4+/5 knee flexion, 4+/5 ankle DF    Time 12    Status Partially Met    Target Date 02/29/20      PT LONG TERM GOAL #2   Title Patient will increase Berg Balance score by > 6 points to demonstrate decreased fall risk during functional activities.    Baseline 09/14/19= 42/56; 10/27/19: 51/56    Time 12    Period Weeks    Status Achieved    Target Date 12/07/19      PT LONG TERM GOAL #3   Title Patient will ascend/descend 4 stairs without rail assist independently without loss of balance to improve ability to get in/out of home.    Baseline 09/14/19= needs UE rail support and no AD; 10/27/19: needs single UE support with reciprocal pattern    Time 12    Period Weeks    Status Partially Met    Target Date 02/29/20      PT LONG TERM GOAL #4   Title Patient will be independent in home exercise program to improve strength/mobility for better functional independence with ADLs.    Time 12    Period Weeks    Status On-going    Target Date 02/29/20                 Plan - 12/20/19 1303    Clinical Impression Statement  Pt was able to perform all exercises today with CGA.. Pt was able to perform strength exercises, demonstrating improvements in LE strength and stability. .  Pt requires verbal, visual and tactile cues during exercise in order to complete tasks with proper form and technique..  Pt would continue to benefit from skilled PT services in order to further strengthen LE's, improve static and dynamic balance, and improve coordination in order to increase functional mobility and decrease risk of falls   Personal Factors and Comorbidities  Age;Comorbidity 1    Comorbidities HTN    Examination-Activity Limitations Locomotion Level;Stand    Examination-Participation Restrictions Driving;Yard Work;Meal Prep      Stability/Clinical Decision Making Stable/Uncomplicated    Rehab Potential Good    PT Frequency 2x / week    PT Duration 12 weeks    PT Treatment/Interventions Patient/family education;Balance training;Neuromuscular re-education;Therapeutic exercise;Stair training    PT Next Visit Plan balance and strengthening    PT Home Exercise Plan Medbridge Access Code: [email or web address]           Patient will benefit from skilled therapeutic intervention in order to improve the following deficits and impairments:  Decreased activity tolerance, Decreased strength, Difficulty walking, Decreased balance, Decreased coordination, Decreased mobility, Decreased endurance  Visit Diagnosis: Other lack of coordination  Muscle weakness (generalized)  Hemiparesis affecting left side as late effect of stroke ([facility])  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 ([facility])     Problem List Patient Active Problem List   Diagnosis Date Noted  . History of stroke with residual effects 11/17/2019  . Right shoulder pain 10/25/2019  . Weakness 10/25/2019  . Decreased GFR 08/15/2019  . Dyslipidemia 08/15/2019  . AKI (acute kidney injury) ([facility]) 08/15/2019  . Hemiparesis affecting left side as late effect of stroke ([facility]) 08/15/2019  . Carotid stenosis 07/29/2019  . Carotid stenosis, right 07/29/2019  . Carotid artery plaque, right 07/19/2019  . Hx of completed stroke 07/19/2019  . Stroke ([facility]) 07/06/2019  . TIA (transient ischemic attack) 07/05/2019  . Hypokalemia 07/05/2019  . GERD without esophagitis 06/18/2017  . Essential hypertension 08/19/2016  . Vertigo 12/05/2015  . Adenomatous colon polyp 01/31/2014  . Basal cell carcinoma 09/23/2013  . Hay fever 09/23/2013  . Vitamin D deficiency 09/23/2013  . Gastric  catarrh 11/28/2011  . Hyperlipidemia, mixed 11/28/2011  . Preop cardiovascular exam 11/28/2011    Mansfield, Kristine S, PT DPT 12/20/2019, 1:04 PM  [place] [facility] MA[facility] REHAB SERVICES [address] [place], [place], [address] Phone: [phone]   Fax:  [phone]  Name: Russell Herman MRN: [id number] Date of Birth: 11/18/1945   

## 2019-12-20 NOTE — Therapy (Signed)
[place] MAIN [facility] SERVICES [address] [place], [place], [address] Phone: [phone]   Fax:  [phone]  Occupational Therapy Treatment  Patient Details  Name: Russell Herman MRN: [id number] Date of Birth: 1945-06-05 Referring Provider (OT): Dr. Letta Pate   Encounter Date: 12/20/2019   OT End of Session - 12/20/19 1120    Visit Number 19    Number of Visits 24    Date for OT Re-Evaluation 03/01/20    OT Start Time 1103    OT Stop Time 1145    OT Time Calculation (min) 42 min    Activity Tolerance Patient tolerated treatment well    Behavior During Therapy [facility] for tasks assessed/performed           Past Medical History:  Diagnosis Date  . Adenomatous colon polyp   . Allergy   . Arthritis   . Cancer ([place])   . Coronary artery disease   . GERD (gastroesophageal reflux disease)   . Hypertension   . Wears hearing aid in both ears     Past Surgical History:  Procedure Laterality Date  . cyst removed    . ENDARTERECTOMY Right 08/04/2019   Procedure: ENDARTERECTOMY CAROTID;  Surgeon: Algernon Huxley, MD;  Location: [facility] ORS;  Service: Vascular;  Laterality: Right;  . EXCISION OF TONGUE LESION Bilateral 05/05/2019   Procedure: EXCISION OF DORSAL TONGUE LESION;  Surgeon: Margaretha Sheffield, MD;  Location: [place];  Service: ENT;  Laterality: Bilateral;  . skin cancer removed      There were no vitals filed for this visit.   Subjective Assessment - 12/20/19 1117    Subjective  Pt. reports that his shoulder surgery date was moved to Dec. 6th.    Patient is accompanied by: Family member    Pertinent History Pt. is a 74 y.o. male who was hospitalized with a CVA on June 1st, 2021. Imaging revealed a small acute infarct in right lateral thalamus, and mild small vessel disease. Pt. received inpatient rehab services, and home health services. Pt. underwent  a Right Carotid Endartectomy on 08/04/2019. Pt. resides with his wife, and has  supportive family. Pt. is a retired Administrator for [employer], and enjoys gardening.    Currently in Pain? Yes    Pain Score 5     Pain Location Shoulder    Pain Orientation Left          OT TREATMENT   Therapeutic Ex.  Pt. worked on pinch strengthening in the left hand for lateral, and 3pt. pinch using yellow, red, green, and blue resistive clips. Pt. worked on placing the clips at various vertical and horizontal angles. Tactile and verbal cues were required for eliciting the desired movement.  Pt. Worked on left grip strengthening using the [employer] digital dynamometer: 32.8#, 32.8#, 32.8#, 32.4#  Neuro muscular re-education:  Pt. worked on [employer] using the [employer] Task. Pt. worked on sustaining grasp on the resistive tweezers while grasping this sticks, and moving them from a horizontal position to a vertical position to prepare for placing them into the pegboard. Pt. Required verbal cues, and cues for visual demonstration for wrist position, and hand pattern when placing them into the pegboard. Pt. Required rest breaks.  Pt. Reports that his surgery date was backed up to Dec. 6th. Pt. Continues to present with persistent 5/10 pain in the left shoulder. Pt. required rest breaks when manipulating, and handling the tweezers. Pt. continues to work on  improving Left hand functioning, and [facility] skills in order to work towards improving, and maximizing independence with ADLs, and IADLs.                        OT Education - 12/20/19 1120    Education Details coordination skills, fine motor    Person(s) Educated Patient    Methods Explanation;Demonstration    Comprehension Verbalized understanding;Need further instruction;Returned demonstration;Verbal cues required               OT Long Term Goals - 11/28/19 1242      OT LONG TERM GOAL #1   Title Pt. will improve LUE strength by 1 muscle grade to assist with gardening, and yardwork.    Baseline  4/5 shoulder flexion, abduction. Pt. has difficulty performing yardwork    Time 12    Period Weeks    Status On-going      OT LONG TERM GOAL #2   Title Pt. will improve FOTO score by 2 points to maximize maximize independence with ADLs, and IADL functioning.    Baseline FOTO score: 59    Time 12    Period Weeks    Status On-going      OT LONG TERM GOAL #3   Title Pt will improve Left hand [facility] skills in order to be able to manipulate small objects    Baseline Left hand [facility] skills conitnue to be limited. pt. has difficulty manipulating small objects efficiently during ADLs, and IADLs.    Time 12    Period Weeks    Status On-going      OT LONG TERM GOAL #4   Title Pt. will imprive left hand motor control skills to be able to independently brush his hair with his LUE.    Baseline Pt. conitnues to have difficulty    Time 12    Period Weeks    Status On-going      OT LONG TERM GOAL #5   Title Pt. will use his left hand to be able to independently hold, and use a washcloth    Baseline Eval: Pt. has difficulty    Time 12    Period Weeks    Status On-going                 Plan - 12/20/19 1121    Clinical Impression Statement Pt. Reports that his surgery date was backed up to Dec. 6th. Pt. Continues to present with persistent 5/10 pain in the left shoulder. Pt. required rest breaks when manipulating, and handling the tweezers. Pt. continues to work on improving Left hand functioning, and [facility] skills in order to work towards improving, and maximizing independence with ADLs, and IADLs.   OT Occupational Profile and History Problem Focused Assessment - Including review of records relating to presenting problem    Occupational performance deficits (Please refer to evaluation for details): ADL's;IADL's    Body Structure / Function / Physical Skills ADL;IADL;Balance;Proprioception;Strength;Coordination;UE functional use;FMC;Dexterity    Rehab Potential Good    Clinical Decision Making  Several treatment options, min-mod task modification necessary    Comorbidities Affecting Occupational Performance: May have comorbidities impacting occupational performance    Modification or Assistance to Complete Evaluation  Min-Moderate modification of tasks or assist with assess necessary to complete eval    OT Frequency 2x / week    OT Duration 12 weeks    OT Treatment/Interventions Self-care/ADL training;Therapeutic exercise;DME and/or AE instruction;Patient/family education;Therapeutic activities;Energy conservation;Neuromuscular education  Consulted and Agree with Plan of Care Patient           Patient will benefit from skilled therapeutic intervention in order to improve the following deficits and impairments:   Body Structure / Function / Physical Skills: ADL, IADL, Balance, Proprioception, Strength, Coordination, UE functional use, FMC, Dexterity       Visit Diagnosis: Muscle weakness (generalized)  Other lack of coordination    Problem List Patient Active Problem List   Diagnosis Date Noted  . History of stroke with residual effects 11/17/2019  . Right shoulder pain 10/25/2019  . Weakness 10/25/2019  . Decreased GFR 08/15/2019  . Dyslipidemia 08/15/2019  . AKI (acute kidney injury) ([place]) 08/15/2019  . Hemiparesis affecting left side as late effect of stroke ([place]) 08/15/2019  . Carotid stenosis 07/29/2019  . Carotid stenosis, right 07/29/2019  . Carotid artery plaque, right 07/19/2019  . Hx of completed stroke 07/19/2019  . Stroke ([place]) 07/06/2019  . TIA (transient ischemic attack) 07/05/2019  . Hypokalemia 07/05/2019  . GERD without esophagitis 06/18/2017  . Essential hypertension 08/19/2016  . Vertigo 12/05/2015  . Adenomatous colon polyp 01/31/2014  . Basal cell carcinoma 09/23/2013  . Hay fever 09/23/2013  . Vitamin D deficiency 09/23/2013  . Gastric catarrh 11/28/2011  . Hyperlipidemia, mixed 11/28/2011  . Preop cardiovascular exam 11/28/2011     Harrel Carina, MS, OTR/L 12/20/2019, 11:32 AM  [place] MAIN [facility] SERVICES [address] [place], [place], [address] Phone: [phone]   Fax:  [phone]  Name: VALENTINO SAAVEDRA MRN: [id number] Date of Birth: Mar 26, 1945

## 2019-12-22 ENCOUNTER — Ambulatory Visit: Payer: Medicare Other | Admitting: Physical Therapy

## 2019-12-22 ENCOUNTER — Ambulatory Visit: Payer: Medicare Other | Admitting: Occupational Therapy

## 2019-12-23 ENCOUNTER — Other Ambulatory Visit: Payer: Self-pay

## 2019-12-23 ENCOUNTER — Encounter: Payer: Self-pay | Admitting: Physical Medicine & Rehabilitation

## 2019-12-23 ENCOUNTER — Encounter
Admission: RE | Admit: 2019-12-23 | Discharge: 2019-12-23 | Disposition: A | Discharge status: Home / Self Care | Payer: Medicare Other | Source: Ambulatory Visit | Attending: Orthopedic Surgery | Admitting: Orthopedic Surgery

## 2019-12-23 ENCOUNTER — Encounter: Payer: Medicare Other | Attending: Physical Medicine & Rehabilitation | Admitting: Physical Medicine & Rehabilitation

## 2019-12-23 VITALS — BP 132/84 | HR 80 | Temp 97.7°F | Ht 66.0 in | Wt 147.2 lb

## 2019-12-23 DIAGNOSIS — I639 Cerebral infarction, unspecified: Secondary | ICD-10-CM

## 2019-12-23 DIAGNOSIS — I6381 Other cerebral infarction due to occlusion or stenosis of small artery: Secondary | ICD-10-CM

## 2019-12-23 DIAGNOSIS — M75102 Unspecified rotator cuff tear or rupture of left shoulder, not specified as traumatic: Secondary | ICD-10-CM | POA: Diagnosis present

## 2019-12-23 HISTORY — DX: Prediabetes: R73.03

## 2019-12-23 HISTORY — DX: Cerebral infarction, unspecified: I63.9

## 2019-12-23 NOTE — Progress Notes (Signed)
Subjective:    [name], male    DOB: August 26, 1945, 74 y.o.   MRN: [id number] 74 y.o. RH- male with history of HTN, GERD, BCC who was admitted to [facility] on 07/05/19 after waking up with left sided weakness, numbness and difficulty walking.  CT head was negative for acute changes.  MRI brain revealed small acute infarct in right lateral thalamus and mild small vessel disease.  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  Stroke was felt to be due to small vessel disease and neurology recommending DAPT x3 months.  Dr. Lucky Cowboy was consulted for input on CAS and plans for surgical intervention on outpatient basis.  Therapy has been ongoing and patient continues to be limited by left-sided weakness with left knee instability and decrease in coordination of LUE.  Admit date: 07/07/2019 Discharge date: 07/15/2019 Right carotid endarterectomy 08/04/2019 HPI  Patient returns today with primary complaint of left shoulder pain.  The left subacromial bursa injection resulted in 1 week pain relief.  Patient has had a left shoulder MRI ordered by orthopedics at [facility] clinic.  It showed partial thickness bursal tear of the left supraspinatus as well as a myotendinous junction tear of the left subscapularis.  The patient is scheduled for rotator cuff repair in December Patient is independent with all self-care and mobility.  He ambulates without assistive device.  He is completing outpatient neuro rehab at [facility].  Patient also indicates that his left hamstring feels tight. Pain Inventory Average Pain 5 Pain Right Now 5 My pain is constant and tingling  In the last 24 hours, has pain interfered with the following? General activity 0 Relation with others 0 Enjoyment of life 0 What TIME of day is your pain at its worst? morning  Sleep (in general) Good  Pain is worse with: walking and  some activites Pain improves with: rest and medication Relief from Meds: 1      Family History  Problem Relation Age of Onset  . Breast cancer Mother   . Melanoma Father        mets to lung  . Breast cancer Sister   . Kidney failure Brother        s/p transplant   Social History   Socioeconomic History  . Marital status: Married    Spouse name: Not on file  . Number of children: Not on file  . Years of education: Not on file  . Highest education level: Not on file  Occupational History  . Not on file  Tobacco Use  . Smoking status: Never Smoker  . Smokeless tobacco: Never Used  . Tobacco comment: smoked "some" as teenager  Vaping Use  . Vaping Use: Never used  Substance and Sexual Activity  . Alcohol use: Not Currently    Alcohol/week: 0.0 standard drinks  . Drug use: Not Currently  . Sexual activity: Not on file  Other Topics Concern  . Not on file  Social History Narrative  . Not on file   Social Determinants of Health   Financial Resource Strain:   . Difficulty of Paying Living Expenses: Not on file  Food Insecurity:   . Worried About Charity fundraiser in the Last Year: Not on file  . Ran Out of Food in the Last Year: Not on file  Transportation Needs:   . Lack of Transportation (Medical): Not on file  .  Lack of Transportation (Non-Medical): Not on file  Physical Activity:   . Days of Exercise per Week: Not on file  . Minutes of Exercise per Session: Not on file  Stress:   . Feeling of Stress : Not on file  Social Connections:   . Frequency of Communication with Friends and Family: Not on file  . Frequency of Social Gatherings with Friends and Family: Not on file  . Attends Religious Services: Not on file  . Active Member of Clubs or Organizations: Not on file  . Attends Archivist Meetings: Not on file  . Marital Status: Not on file   Past Surgical History:  Procedure Laterality Date  . cyst removed    . ENDARTERECTOMY Right  08/04/2019   Procedure: ENDARTERECTOMY CAROTID;  Surgeon: Algernon Huxley, MD;  Location: [facility] ORS;  Service: Vascular;  Laterality: Right;  . EXCISION OF TONGUE LESION Bilateral 05/05/2019   Procedure: EXCISION OF DORSAL TONGUE LESION;  Surgeon: Margaretha Sheffield, MD;  Location: [place];  Service: ENT;  Laterality: Bilateral;  . skin cancer removed     Past Medical History:  Diagnosis Date  . Adenomatous colon polyp   . Allergy   . Arthritis   . Cancer ([place])   . Coronary artery disease   . GERD (gastroesophageal reflux disease)   . Hypertension   . Wears hearing aid in both ears    There were no vitals taken for this visit.  Opioid Risk Score:   Fall Risk Score:  `1  Depression screen PHQ 2/9  Depression screen PHQ 2/9 09/02/2019  Decreased Interest 0  Down, Depressed, Hopeless 0  PHQ - 2 Score 0  Altered sleeping 0  Tired, decreased energy 3  Change in appetite 0  Feeling bad or failure about yourself  0  Trouble concentrating 0  Moving slowly or fidgety/restless 3  Suicidal thoughts 0  PHQ-9 Score 6  Difficult doing work/chores Somewhat difficult   Review of Systems  Musculoskeletal: Positive for gait problem.       Pain in both feet Left thigh pain Left shoulder pain  All other systems reviewed and are negative.      Objective:   Physical Exam Vitals and nursing note reviewed.  Constitutional:      Appearance: He is normal weight.  HENT:     Head: Normocephalic and atraumatic.  Eyes:     Extraocular Movements: Extraocular movements intact.     Conjunctiva/sclera: Conjunctivae normal.     Pupils: Pupils are equal, round, and reactive to light.  Musculoskeletal:     Comments: Positive impingement testing left side at 90 degrees.  Mildly reduced left shoulder internal and external rotation is able to reach behind the back and behind the head but not to the same degree as on the right side.  Left knee with full range of motion negative straight leg raising   Neurological:     Mental Status: He is alert and oriented to person, place, and time.     Cranial Nerves: No cranial nerve deficit or dysarthria.     Motor: No tremor or abnormal muscle tone.     Coordination: Coordination is intact.     Gait: Gait is intact.     Comments: Motor strength is 5/5 bilateral deltoid bicep tricep grip hip flexion extensor ankle dorsiflexor Gait is without evidence of toe drag or knee instability Sensation equal bilateral upper and lower limbs Coordination intact finger thumb opposition bilaterally as well as  no evidence of dysdiadochokinesis with rapid alternating supination pronation of the upper extremities.  Psychiatric:        Mood and Affect: Mood normal.        Behavior: Behavior normal.        Thought Content: Thought content normal.        Judgment: Judgment normal.           Assessment & Plan:  #1.  History of right thalamic infarct, left hemiparesis and hemisensory deficits have largely resolved.  He is back at a modified independent level for all self-care and mobility, he will be completing his outpatient therapy #2.  Left shoulder partial thickness supraspinatus tear as well as subscapularis myotendinous junction tear.  He is following up with orthopedics and is scheduled for rotator cuff repair.  Likely his left shoulder strength and coordination have largely normalized so that he should be able participate in postoperative rehabilitation following his shoulder surgery. #3.  Left hamstring tightness no significant pain, have printed some additional hamstring stretching exercises for patient. Follow-up physical medicine rehab on as-needed basis

## 2019-12-23 NOTE — Patient Instructions (Signed)
Hamstring Strain Rehab Ask your health care provider which exercises are safe for you. Do exercises exactly as told by your health care provider and adjust them as directed. It is normal to feel mild stretching, pulling, tightness, or discomfort as you do these exercises. Stop right away if you feel sudden pain or your pain gets worse. Do not begin these exercises until told by your health care provider. Stretching and range-of-motion exercises These exercises warm up your muscles and joints and improve the movement and flexibility of your thighs. These exercises also help to relieve pain, numbness, and tingling. Talk to your health care provider about these restrictions. Knee extension, seated  1. Sit with your left / right heel propped on a chair, a coffee table, or a footstool. Do not have anything under your knee to support it. 2. Allow your leg muscles to relax, letting gravity straighten out your knee (extension). You should feel a stretch behind your left / right knee. 3. If told by your health care provider, deepen the stretch by placing a __________ weight on your thigh, just above your kneecap. 4. Hold this position for __________ seconds. Repeat __________ times. Complete this exercise __________ times a day. Seated stretch This exercise is sometimes called hamstrings and adductors stretch. 1. Sit on the floor with your legs stretched wide. Keep your knees straight during this exercise. 2. Keeping your head and back in a straight line, bend at your waist to reach for your left foot (position A). You should feel a stretch in your right inner thigh (adductors). 3. Hold this position for __________ seconds. Then slowly return to the upright position. 4. Keeping your head and back in a straight line, bend at your waist to reach forward (position B). You should feel a stretch behind both of your thighs or knees (hamstrings). 5. Hold this position for __________ seconds. Then slowly return to  the upright position. 6. Keeping your head and back in a straight line, bend at your waist to reach for your right foot (position C). You should feel a stretch in your left inner thigh (adductors). 7. Hold this position for __________ seconds. Then slowly return to the upright position. Repeat __________ times. Complete this exercise __________ times a day. Hamstrings stretch, supine  1. Lie on your back (supine position). 2. Loop a belt or towel over the ball of your left / right foot. The ball of your foot is on the walking surface, right under your toes. 3. Straighten your left / right knee and slowly pull on the belt or towel to raise your leg. ? Do not let your left / right knee bend while you do this. ? Keep your other leg flat on the floor. ? Raise the left / right leg until you feel a gentle stretch behind your left / right knee or thigh (hamstrings). 4. Hold this position for __________ seconds. 5. Slowly return your leg to the starting position. Repeat __________ times. Complete this exercise __________ times a day. Strengthening exercises These exercises build strength and endurance in your thighs. Endurance is the ability to use your muscles for a long time, even after they get tired. Straight leg raises, prone This exercise strengthens the muscles that move the hips (hip extensors). 1. Lie on your abdomen on a firm surface (prone position). 2. Tense the muscles in your buttocks and lift your left / right leg about 4 inches (10 cm). Keep your knee straight as you lift your leg. If you  cannot lift your leg that high without arching your back, place a pillow under your hips. 3. Hold the position for __________ seconds. 4. Slowly lower your leg to the starting position. 5. Allow your muscles to relax completely before you start the next repetition. Repeat __________ times. Complete this exercise __________ times a day. Bridge This exercise strengthens the muscles in your buttocks  and the back of your thighs (hip extensors). 1. Lie on your back on a firm surface with your knees bent and your feet flat on the floor. 2. Tighten your buttocks muscles and lift your bottom off the floor until the trunk of your body is level with your thighs. ? You should feel the muscles working in your buttocks and the back of your thighs. ? Do not arch your back. 3. Hold this position for __________ seconds. 4. Slowly lower your hips to the starting position. 5. Let your buttocks muscles relax completely between repetitions. 6. If told by your health care provider, keep your bottom lifted off the floor while you slowly walk your feet away from you as far as you can control. Hold for __________ seconds, then slowly walk your feet back toward you. Repeat __________ times. Complete this exercise __________ times a day. Lateral walking with band This is an exercise in which you walk sideways (lateral), with tension provided by an exercise band. The exercise strengthens the muscles in your hip (hip abductors). 1. Stand in a long hallway. 2. Wrap a loop of exercise band around your legs, just above your knees. 3. Bend your knees gently and drop your hips down and back so your weight is over your heels. 4. Step to the side to move down the length of the hallway, keeping your toes pointed ahead of you and keeping tension in the band. 5. Repeat, leading with your other leg. Repeat __________ times. Complete this exercise __________ times a day. Single leg stand with reaching This exercise is also called eccentric hamstring stretch. 1. Stand on your left / right foot. Keep your big toe down on the floor and try to keep your arch lifted. 2. Slowly reach down toward the floor as far as you can while keeping your balance. Lowering your thigh under tension is called eccentric stretching. 3. Hold this position for __________ seconds. Repeat __________ times. Complete this exercise __________ times a  day. Plank, prone This exercise strengthens muscles in your abdomen and core area. 1. Lie on your abdomen on the floor (prone position),and prop yourself up on your elbows. Your hands should be straight out in front of you, and your elbows should be below your shoulders. Position your feet similar to a push-up position so your toes are on the ground. 2. Tighten your abdominal muscles and lift your body off the floor. ? Do not arch your back. ? Do not hold your breath. 3. Hold this position for __________ seconds. Repeat __________ times. Complete this exercise __________ times a day. This information is not intended to replace advice given to you by your health care provider. Make sure you discuss any questions you have with your health care provider. Document Revised: 05/13/2018 Document Reviewed: 01/18/2018 Elsevier Patient Education  [place].

## 2019-12-23 NOTE — Patient Instructions (Signed)
Your procedure is scheduled on: 01/09/20 Report to [facility] after check in at admitting desk.. To find out your arrival time please call [phone] between 1PM - 3PM on 01/06/20 .  Remember: Instructions that are not followed completely may result in serious medical risk, up to and including death, or upon the discretion of your surgeon and anesthesiologist your surgery may need to be rescheduled.     _X__ 1. Do not eat food after midnight the night before your procedure.                 No gum chewing or hard candies. You may drink clear liquids up to 2 hours                 before you are scheduled to arrive for your surgery- DO not drink clear                 liquids within 2 hours of the start of your surgery.                 Clear Liquids include:  water, apple juice without pulp, clear carbohydrate                 drink such as Clearfast or Gatorade, Black Coffee or Tea (Do not add                 anything to coffee or tea). Diabetics water only  __X__2.  On the morning of surgery brush your teeth with toothpaste and water, you                 may rinse your mouth with mouthwash if you wish.  Do not swallow any              toothpaste of mouthwash.     _X__ 3.  No Alcohol for 24 hours before or after surgery.   _X__ 4.  Do Not Smoke or use e-cigarettes For 24 Hours Prior to Your Surgery.                 Do not use any chewable tobacco products for at least 6 hours prior to                 surgery.  ____  5.  Bring all medications with you on the day of surgery if instructed.   __X__  6.  Notify your doctor if there is any change in your medical condition      (cold, fever, infections).     Do not wear jewelry, make-up, hairpins, clips or nail polish. Do not wear lotions, powders, or perfumes.  Do not shave 48 hours prior to surgery. Men may shave face and neck. Do not bring valuables to the hospital.    [facility] is  not responsible for any belongings or valuables.  Contacts, dentures/partials or body piercings may not be worn into surgery. Bring a case for your contacts, glasses or hearing aids, a denture cup will be supplied. Leave your suitcase in the car. After surgery it may be brought to your room. For patients admitted to the hospital, discharge time is determined by your treatment team.   Patients discharged the day of surgery will not be allowed to drive home.   Please read over the following fact sheets that you were given:   MRSA Information  __X__ Take these medicines the morning of surgery  with A SIP OF WATER:    1. pantoprazole (PROTONIX) 40 MG tablet  2.   3.   4.  5.  6.  ____ Fleet Enema (as directed)   __X__ Use CHG Soap/SAGE wipes as directed  ____ Use inhalers on the day of surgery  ____ Stop metformin/Janumet/Farxiga 2 days prior to surgery    ____ Take 1/2 of usual insulin dose the night before surgery. No insulin the morning          of surgery.   __X__ Stop Blood Thinners Coumadin/Plavix/Xarelto/Pleta/Pradaxa/Eliquis/Effient/Aspirin  on   Or contact your Surgeon, Cardiologist or Medical Doctor regarding  ability to stop your blood thinners  __X__ Stop Anti-inflammatories 7 days before surgery such as Advil, Ibuprofen, Motrin,  BC or Goodies Powder, Naprosyn, Naproxen, Aleve, Aspirin    __X__ Stop all herbal supplements, fish oil or vitamin E until after surgery.    ____ Bring C-Pap to the hospital.    [place] "CLEAR" PRE SURGERY DRINK 2 HOURS BEFORE ARRIVING ON 01/09/20

## 2019-12-23 NOTE — Progress Notes (Signed)
[facility] Perioperative Services: Pre-Admission/Anesthesia Testing   Date: 12/23/19 Name: Russell Herman MRN:   [id number]  Re: Consideration of preoperative prophylactic antibiotic change   Request sent to: Leim Fabry, MD (routed and/or faxed via [facility])  Planned Surgical Procedure(s):    Case: [id number] Date/Time: 01/09/20 0715   Procedure: Left shoulder arthroscopic vs mini-open rotator cuff repair with possible allograft vs Regeneten patch, subacromial decompression, and biceps tenodesis - Reche Dixon to Assist (Left )   Anesthesia type: Choice   Pre-op diagnosis: Chronic left shoulder pain M25.512, G89.29   Location: [facility] OR ROOM 08 / [facility] FOR ANESTHESIA [facility]   Surgeons: Leim Fabry, MD     Notes: 1. Patient has a documented allergy to Cephalexin  Advising that PCN has caused him to experience "a minor reaction" in the past, however he was unable to advise me on the symptoms he experienced.    Patient states, "the only thing that I am really allergic to is sulfa drugs".    2. Received PCN with no documented complications . AUGMENTIN prescribed on 10/29/2015  3. Screened as appropriate for cephalosporin use during medication reconciliation . No immediate angioedema, dysphagia, SOB, anaphylaxis symptoms. . No severe rash involving mucous membranes or skin necrosis. . No hospital admissions related to side effects of PCN/cephalosporin use.  . No documented reaction to PCN or cephalosporin in the last 10 years.  Request:  As an evidence based approach to reducing the rate of incidence for post-operative SSI and the development of MDROs, could an agent with narrower coverage for preoperative prophylaxis in this patient's upcoming surgical course be considered?  1. Currently ordered preoperative prophylactic ABX: clindamycin.   2. Specifically requesting change to cephalosporin (CEFAZOLIN).   3. Please communicate decision with me and I will change  the orders in Epic as per your direction.   Things to consider:  Many patients report that they were "allergic" to PCN earlier in life, however this does not translate into a true lifelong allergy. Patients can lose sensitivity to specific IgE antibodies over time if PCN is avoided (Kleris & Lugar, 2019).   Up to 10% of the adult population and 15% of hospitalized patients report an allergy to PCN, however clinical studies suggest that 90% of those reporting an allergy can tolerate PCN antibiotics (Kleris & Lugar, 2019).   Cross-sensitivity between PCN and cephalosporins has been documented as being as high as 10%, however this estimation included data believed to have been collected in a setting where there was contamination. Newer data suggests that the prevalence of cross-sensitivity between PCN and cephalosporins is actually estimated to be closer to 1% (Hermanides et al., 2018).    Patients labeled as PCN allergic, whether they are truly allergic or not, have been found to have inferior outcomes in terms of rates of serious infection, and these patients tend to have longer hospital stays ([place], 2019).   Treatment related secondary infections, such as Clostridioides difficile, have been linked to the improper use of broad spectrum antibiotics in patients improperly labeled as PCN allergic (Kleris & Lugar, 2019).   Anaphylaxis from cephalosporins is rare and the evidence suggests that there is no increased risk of an anaphylactic type reaction when cephalosporins are used in a PCN allergic patient (Pichichero, 2006).  Citations: Hermanides J, Lemkes BA, Prins Pearla Dubonnet MW, Terreehorst I. Presumed ?-Lactam Allergy and Cross-reactivity in the Operating Theater: A Practical Approach. Anesthesiology. 2018 Aug;129(2):335-342. doi: 10.1097/ALN.0000000000002252. PMID: 44818563.  Kleris,  R. S., & Lugar, P. L. (2019). Things We Do For No Reason: Failing to Question a Penicillin Allergy  History. Journal of hospital medicine, 14(10), [phone]. Advance online publication. https://www.wallace-middleton.info/  Pichichero, M. E. (2006). Cephalosporins can be prescribed safely for penicillin-allergic patients. Journal of family medicine, 55(2), 106-112. Accessed: https://cdn.mdedge.com/files/s60fs-public/Document/September-2017/5502JFP_AppliedEvidence1.pdf   Honor Loh, MSN, APRN, FNP-C, CEN [facility]  Peri-operative Services Nurse Practitioner 12/23/19 4:18 PM

## 2019-12-26 ENCOUNTER — Other Ambulatory Visit: Payer: Medicare Other

## 2019-12-26 NOTE — Progress Notes (Signed)
  [facility] Perioperative Services: Pre-Admission/Anesthesia Testing     Date: 12/26/19  Name: CYLIS AYARS MRN:   [id number]  Re: Change in ABX for upcoming surgery   Case: [id number] Date/Time: 01/09/20 0715   Procedure: Left shoulder arthroscopic vs mini-open rotator cuff repair with possible allograft vs Regeneten patch, subacromial decompression, and biceps tenodesis - Reche Dixon to Assist (Left )   Anesthesia type: Choice   Pre-op diagnosis: Chronic left shoulder pain M25.512, G89.29   Location: [facility] OR ROOM 08 / [facility]   Surgeons: Leim Fabry, MD     Primary attending surgeon was consulted regarding consideration of therapeutic change in antimicrobial agent being used for preoperative prophylaxis in this patient's upcoming surgical case. Following analysis of the risk versus benefits, Dr. No att. providers found advising that it would be acceptable to discontinue the ordered clindamycin and place an order for cefazolin 2 gm IV on call to the OR. Orders for this patient were amended by me following collaborative conversation with attending surgeon.  Honor Loh, MSN, APRN, FNP-C, CEN [facility]  Peri-operative Services Nurse Practitioner Phone: [phone] 12/26/19 7:52 AM

## 2019-12-27 ENCOUNTER — Ambulatory Visit: Payer: Medicare Other | Admitting: Physical Therapy

## 2019-12-27 ENCOUNTER — Other Ambulatory Visit: Payer: Self-pay

## 2019-12-27 ENCOUNTER — Encounter: Payer: Self-pay | Admitting: Occupational Therapy

## 2019-12-27 ENCOUNTER — Ambulatory Visit: Payer: Medicare Other | Admitting: Occupational Therapy

## 2019-12-27 ENCOUNTER — Encounter: Payer: Self-pay | Admitting: Physical Therapy

## 2019-12-27 DIAGNOSIS — R278 Other lack of coordination: Secondary | ICD-10-CM

## 2019-12-27 DIAGNOSIS — I69354 Hemiplegia and hemiparesis following cerebral infarction affecting left non-dominant side: Secondary | ICD-10-CM

## 2019-12-27 DIAGNOSIS — M6281 Muscle weakness (generalized): Secondary | ICD-10-CM

## 2019-12-27 DIAGNOSIS — I6309 Cerebral infarction due to thrombosis of other precerebral artery: Secondary | ICD-10-CM

## 2019-12-27 NOTE — Therapy (Addendum)
[place] MAIN [facility] SERVICES [address] [place], [place], [address] Phone: [phone]   Fax:  [phone]  Occupational Therapy Treatment/Discharge Summary  Patient Details  Name: Russell Herman MRN: [id number] Date of Birth: 07/03/1945 Referring Provider (OT): Dr. Letta Pate   Encounter Date: 12/27/2019   OT End of Session - 12/27/19 1215    Visit Number 20    Number of Visits 24    Date for OT Re-Evaluation 03/01/20    OT Start Time 1145    OT Stop Time 1230    OT Time Calculation (min) 45 min    Activity Tolerance Patient tolerated treatment well    Behavior During Therapy [facility] for tasks assessed/performed           Past Medical History:  Diagnosis Date  . Adenomatous colon polyp   . Allergy   . Arthritis   . Cancer ([place])    basal cell skin  . Coronary artery disease   . GERD (gastroesophageal reflux disease)   . Hypertension   . Pre-diabetes   . Stroke ([place])   . Wears hearing aid in both ears     Past Surgical History:  Procedure Laterality Date  . cyst removed    . ENDARTERECTOMY Right 08/04/2019   Procedure: ENDARTERECTOMY CAROTID;  Surgeon: Algernon Huxley, MD;  Location: [facility] ORS;  Service: Vascular;  Laterality: Right;  . EXCISION OF TONGUE LESION Bilateral 05/05/2019   Procedure: EXCISION OF DORSAL TONGUE LESION;  Surgeon: Margaretha Sheffield, MD;  Location: [place];  Service: ENT;  Laterality: Bilateral;  . skin cancer removed      There were no vitals filed for this visit.   Subjective Assessment - 12/27/19 1216    Subjective  Pt. is discharging from OT as he prepares for shoulder surgery.    Patient is accompanied by: Family member    Pertinent History Pt. is a 74 y.o. male who was hospitalized with a CVA on June 1st, 2021. Imaging revealed a small acute infarct in right lateral thalamus, and mild small vessel disease. Pt. received inpatient rehab services, and home health services. Pt. underwent  a Right  Carotid Endartectomy on 08/04/2019. Pt. resides with his wife, and has supportive family. Pt. is a retired Administrator for [employer], and enjoys gardening.    Patient Stated Goals To be as independent as possible.    Pain Score 5     Pain Location Shoulder    Pain Orientation Left    Pain Descriptors / Indicators Aching              [facility] OT Assessment - 12/27/19 0001      Coordination   Left 9 Hole Peg Test 32      Hand Function   Left Hand Grip (lbs) 55    Left Hand Lateral Pinch 18 lbs    Left 3 point pinch 16 lbs          OT TREATMENT    Neuro muscular re-education:  Pt. worked on left [facility] skills using the Building services engineer Task. Pt. worked on sustaining grasp on the resistive tweezers while grasping the extra thin 1" sticks, and moving them from a horizontal position to a vertical position to prepare for placing them into the pegboard. Pt. required verbal cues, and cues for visual demonstration for wrist position, and hand pattern when placing them into the pegboard.  Pt. is scheduled to have left rotator cuff repair surgery after  having persistent left shoulder pain which has not responded to therapy, injections, or medication. Pt. Is now appropriate for discharge form OT services for stroke rehab. Pt. Has made progress overall with left hand pinch strength, and [facility] skills. Pt. continues to be limited with using his left hand to perform ADLs tasks, and reaching tasks secondary to pain. Pt. Is appropriate for discharge from OT services at this time.                    OT Education - 12/27/19 1218    Education Details coordination skills, fine motor    Person(s) Educated Patient    Methods Explanation;Demonstration    Comprehension Verbalized understanding;Need further instruction;Returned demonstration;Verbal cues required               OT Long Term Goals - 12/27/19 1206      OT LONG TERM GOAL #1   Title Pt. will improve LUE strength by 1 muscle  grade to assist with gardening, and yardwork.    Baseline Pt. continues to have difficulty performing yardwork    Time 12    Period Weeks    Status Not Met      OT LONG TERM GOAL #2   Title Pt. will improve FOTO score by 2 points to maximize maximize independence with ADLs, and IADL functioning.    Baseline FOTO score: 53    Time 12    Period Weeks    Status Not Met      OT LONG TERM GOAL #3   Title Pt will improve Left hand [facility] skills in order to be able to manipulate small objects    Baseline Left hand continues to improvie with [facility] skills. manipulating small objects efficiently during ADLs, and IADLs.    Time 12      OT LONG TERM GOAL #4   Title Pt. will improve left hand motor control skills to be able to independently brush his hair with his LUE.    Baseline Pt. conitnues to have difficulty    Time 12    Period Weeks    Status Not Met      OT LONG TERM GOAL #5   Title Pt. will use his left hand to be able to independently hold, and use a washcloth    Baseline Pt. is able to hold, and wring out the washcloth. Pt. is unable to wash his face secondary to shoulder pain.    Time 12    Period Weeks    Status Not Met                 Plan - 12/27/19 1510    Clinical Impression Statement Pt. is scheduled to have left rotator cuff repair surgery after having persistent left shoulder pain which has not responded to therapy, injections, or medication. Pt. Is now appropriate for discharge form OT services for stroke rehab. Pt. Has made progress overall with left hand pinch strength, and [facility] skills. Pt. continues to be limited with using his left hand to perform ADLs tasks, and reaching tasks secondary to pain. Pt. Is appropriate for discharge from OT services at this time.   OT Occupational Profile and History Problem Focused Assessment - Including review of records relating to presenting problem    Occupational performance deficits (Please refer to evaluation for details):  ADL's;IADL's    Body Structure / Function / Physical Skills ADL;IADL;Balance;Proprioception;Strength;Coordination;UE functional use;FMC;Dexterity    Rehab Potential Good    Clinical Decision  Making Several treatment options, min-mod task modification necessary    Comorbidities Affecting Occupational Performance: May have comorbidities impacting occupational performance    Modification or Assistance to Complete Evaluation  Min-Moderate modification of tasks or assist with assess necessary to complete eval    OT Frequency 2x / week    OT Duration 12 weeks    OT Treatment/Interventions Self-care/ADL training;Therapeutic exercise;DME and/or AE instruction;Patient/family education;Therapeutic activities;Energy conservation;Neuromuscular education    Consulted and Agree with Plan of Care Patient           Patient will benefit from skilled therapeutic intervention in order to improve the following deficits and impairments:   Body Structure / Function / Physical Skills: ADL, IADL, Balance, Proprioception, Strength, Coordination, UE functional use, FMC, Dexterity       Visit Diagnosis: Muscle weakness (generalized)  Other lack of coordination    Problem List Patient Active Problem List   Diagnosis Date Noted  . History of stroke with residual effects 11/17/2019  . Right shoulder pain 10/25/2019  . Weakness 10/25/2019  . Decreased GFR 08/15/2019  . Dyslipidemia 08/15/2019  . AKI (acute kidney injury) ([place]) 08/15/2019  . Hemiparesis affecting left side as late effect of stroke ([place]) 08/15/2019  . Carotid stenosis 07/29/2019  . Carotid stenosis, right 07/29/2019  . Carotid artery plaque, right 07/19/2019  . Hx of completed stroke 07/19/2019  . Stroke ([place]) 07/06/2019  . TIA (transient ischemic attack) 07/05/2019  . Hypokalemia 07/05/2019  . GERD without esophagitis 06/18/2017  . Essential hypertension 08/19/2016  . Vertigo 12/05/2015  . Adenomatous colon polyp 01/31/2014  . Basal  cell carcinoma 09/23/2013  . Hay fever 09/23/2013  . Vitamin D deficiency 09/23/2013  . Gastric catarrh 11/28/2011  . Hyperlipidemia, mixed 11/28/2011  . Preop cardiovascular exam 11/28/2011    Harrel Carina, MS, OTR/L 12/27/2019, 3:11 PM  [place] MAIN [facility] SERVICES [address] [place], [place], [address] Phone: [phone]   Fax:  [phone]  Name: JAKUB DEBOLD MRN: [id number] Date of Birth: 28-Jun-1945

## 2019-12-27 NOTE — Therapy (Signed)
[place] MAIN [facility] SERVICES [address] [place], [place], [address] Phone: [phone]   Fax:  [phone]  Physical Therapy Treatment  Discharge Summary  Patient Details  Name: Russell Herman MRN: [id number] Date of Birth: 10-01-1945 No data recorded  Encounter Date: 12/27/2019   PT End of Session - 12/27/19 1112    Visit Number 22    Number of Visits 25    Date for PT Re-Evaluation 03/01/19    PT Start Time 1100    PT Stop Time 1140    PT Time Calculation (min) 40 min    Equipment Utilized During Treatment Gait belt    Activity Tolerance Patient tolerated treatment well;No increased pain    Behavior During Therapy WFL for tasks assessed/performed           Past Medical History:  Diagnosis Date   Adenomatous colon polyp    Allergy    Arthritis    Cancer ([place])    basal cell skin   Coronary artery disease    GERD (gastroesophageal reflux disease)    Hypertension    Pre-diabetes    Stroke [facility])    Wears hearing aid in both ears     Past Surgical History:  Procedure Laterality Date   cyst removed     ENDARTERECTOMY Right 08/04/2019   Procedure: ENDARTERECTOMY CAROTID;  Surgeon: Algernon Huxley, MD;  Location: [facility] ORS;  Service: Vascular;  Laterality: Right;   EXCISION OF TONGUE LESION Bilateral 05/05/2019   Procedure: EXCISION OF DORSAL TONGUE LESION;  Surgeon: Margaretha Sheffield, MD;  Location: [place];  Service: ENT;  Laterality: Bilateral;   skin cancer removed      There were no vitals filed for this visit.   Subjective Assessment - 12/27/19 1111    Subjective Pt reports 3/10 L shoulder pain , 4/ 5 L hip pain and 5/5 Left leg pain. No falls reported since last therapy session. Pt reports that he is having a R shoulder MRI this weekend as well as a repeat brain MRI because his provider is concerned about him having another "mini stroke" recently.    Pertinent History Right lateral thalamic infarct mild  ataxia and mild hemiparesis. Patient went to rehab 07/07/19-07/15/19. Then he had surgery 7/1 for  s/p successful right CEA    Currently in Pain? Yes    Pain Score 3     Pain Location Shoulder    Pain Orientation Left    Pain Descriptors / Indicators Aching    Pain Onset More than a month ago    Multiple Pain Sites No    Pain Onset 1 to 4 weeks ago           Treatment  Neuromuscular Re-education  Rocker board fwd/bwd, side to side x 20 each direction Heel/toe raises without UE support 3s hold x 10 each 1/2 foam roll balance with flat side up 30s x 2 reps 1/2 foam roll balance with flat side down 30s x 2 reps 1/2 foam roll tandem balance alternating forward LE 30s x 2 each LE forward Lateral side steps from foam to 6 inch stool left and right x 15 Backwards stepping from foam to 6 inch stool x 15      Pt educated throughout session about proper posture and technique with exercises. Improved exercise technique, movement at target joints, use of target muscles after min to mod verbal, visual, tactile cues.  PT Education - 12/27/19 1112    Education Details HEP    Person(s) Educated Patient    Methods Explanation    Comprehension Verbalized understanding            PT Short Term Goals - 10/27/19 1110      PT SHORT TERM GOAL #1   Title Patient will increase lower extremity functional scale to >60/80 to demonstrate improved functional mobility and increased tolerance with ADLs.    Baseline 09/14/19= 43/80; 10/27/19: 49/80    Time 6    Period Weeks    Status Partially Met    Target Date 11/02/19      PT SHORT TERM GOAL #2   Title Patient will be modified independent in walking on even/uneven surface with least restrictive assistive device, for 20+ minutes without rest break, reporting some difficulty or less to improve walking tolerance with community ambulation including grocery shopping, going to church,etc.    Baseline Patient  can ambulate for 10 minutes on level surfaces without incline and without AD, 10/27/19: Pt reports he can ambulate for 25 minutes;    Time 6    Period Weeks    Status Achieved    Target Date 11/02/19             PT Long Term Goals - 12/06/19 1451      PT LONG TERM GOAL #1   Title Patient will increase BLE gross strength to 4+/5 as to improve functional strength for independent gait, increased standing tolerance and increased ADL ability.    Baseline LLE 3/5 hip abd, 3+/5 hip flex, knee flex/ext 4/5, ankle 4/5; 10/27/19: LLE: 3+/5 hip abd, 4/5 hip flex, 5/5 knee extension, 4+/5 knee flexion, 4+/5 ankle DF    Time 12    Status Partially Met    Target Date 02/29/20      PT LONG TERM GOAL #2   Title Patient will increase Berg Balance score by > 6 points to demonstrate decreased fall risk during functional activities.    Baseline 09/14/19= 42/56; 10/27/19: 51/56    Time 12    Period Weeks    Status Achieved    Target Date 12/07/19      PT LONG TERM GOAL #3   Title Patient will ascend/descend 4 stairs without rail assist independently without loss of balance to improve ability to get in/out of home.    Baseline 09/14/19= needs UE rail support and no AD; 10/27/19: needs single UE support with reciprocal pattern    Time 12    Period Weeks    Status Partially Met    Target Date 02/29/20      PT LONG TERM GOAL #4   Title Patient will be independent in home exercise program to improve strength/mobility for better functional independence with ADLs.    Time 12    Period Weeks    Status On-going    Target Date 02/29/20                 Plan - 12/27/19 1113    Clinical Impression Statement Patient has demonstrated improved dynamic balance, gait, transfers, mobility, LE strength and has made progress with goals # 1,2,3,4, . Patient will be discharged from skilled physical therapy to HEP.   Personal Factors and Comorbidities Age;Comorbidity 1    Comorbidities HTN     Examination-Activity Limitations Locomotion Level;Stand    Examination-Participation Restrictions Driving;Yard Work;Meal Prep    Stability/Clinical Decision Making Stable/Uncomplicated    Rehab Potential Good  PT Frequency 2x / week    PT Duration 12 weeks    PT Treatment/Interventions Patient/family education;Balance training;Neuromuscular re-education;Therapeutic exercise;Stair training    PT Next Visit Plan balance and strengthening    PT Home Exercise Plan Medbridge Access Code: [email or web address]           Patient will benefit from skilled therapeutic intervention in order to improve the following deficits and impairments:  Decreased activity tolerance, Decreased strength, Difficulty walking, Decreased balance, Decreased coordination, Decreased mobility, Decreased endurance  Visit Diagnosis: Muscle weakness (generalized)  Other lack of coordination  Hemiparesis affecting left side as late effect of stroke ([place])  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 [facility])     Problem List Patient Active Problem List   Diagnosis Date Noted   History of stroke with residual effects 11/17/2019   Right shoulder pain 10/25/2019   Weakness 10/25/2019   Decreased GFR 08/15/2019   Dyslipidemia 08/15/2019   AKI (acute kidney injury) ([place]) 08/15/2019   Hemiparesis affecting left side as late effect of stroke ([place]) 08/15/2019   Carotid stenosis 07/29/2019   Carotid stenosis, right 07/29/2019   Carotid artery plaque, right 07/19/2019   Hx of completed stroke 07/19/2019   Stroke ([place]) 07/06/2019   TIA (transient ischemic attack) 07/05/2019   Hypokalemia 07/05/2019   GERD without esophagitis 06/18/2017   Essential hypertension 08/19/2016   Vertigo 12/05/2015   Adenomatous colon polyp 01/31/2014   Basal cell carcinoma 09/23/2013   Hay fever 09/23/2013   Vitamin D deficiency 09/23/2013   Gastric catarrh 11/28/2011   Hyperlipidemia, mixed  11/28/2011   Preop cardiovascular exam 11/28/2011    Arelia Sneddon SPT DPT 12/27/2019, 11:23 AM  [place] MAIN [facility] SERVICES [address] [place], [place], [address] Phone: [phone]   Fax:  [phone]  Name: Russell Herman MRN: [id number] Date of Birth: 1945/06/30

## 2019-12-28 ENCOUNTER — Encounter: Payer: Self-pay | Admitting: Orthopedic Surgery

## 2019-12-28 NOTE — Progress Notes (Signed)
[facility] Perioperative Services  Pre-Admission/Anesthesia Testing Clinical Review  Date: 12/28/19  Patient Demographics:  Name: Russell Herman DOB:   01-09-1946 MRN:   [id number]  Planned Surgical Procedure(s):    Case: [id number] Date/Time: 01/09/20 0715   Procedure: Left shoulder arthroscopic vs mini-open rotator cuff repair with possible allograft vs Regeneten patch, subacromial decompression, and biceps tenodesis - Reche Dixon to Assist (Left )   Anesthesia type: Choice   Pre-op diagnosis: Chronic left shoulder pain M25.512, G89.29   Location: [facility] OR ROOM 08 / [place] ORS FOR ANESTHESIA GROUP   Surgeons: Leim Fabry, MD     NOTE: Available PAT nursing documentation and vital signs have been reviewed. Clinical nursing staff has updated patient's PMH/PSHx, current medication list, and drug allergies/intolerances to ensure comprehensive history available to assist in medical decision making as it pertains to the aforementioned surgical procedure and anticipated anesthetic course.   Clinical Discussion:  Russell Herman is a 74 y.o. male who is submitted for pre-surgical anesthesia review and clearance prior to him undergoing the above procedure. Patient has never been a smoker. Pertinent PMH includes: CAD, HTN, HLD, CVA (resulting LEFT sided hemiparesis), carotid stenosis prediabetes, GERD (on daily PPI therapy), OA.  Patient presented to the ED on 07/05/2019 with complaints of LEFT sided numbness, tingling, and inability to bear weight/ambulate.  Initial NIHSS was 3.  CT imaging of the head without contrast revealed no acute ICH, mass-effect, or evidence of acute infarction.  Labs revealed a mild hypokalemia; potassium 3.2 mmol/L.  Neurology was consulted and CVA was suspected; further testing recommended.  Due to the unknown onset of his symptoms, patient is not a candidate for TPA.  Patient started on prophylactic DAPT therapy (ASA + clopidogrel days). Patient was  admitted to the hospital.  He underwent carotid dopplers on 07/05/2019 that revealed bilateral carotid stenosis; right 70% and left 50%.  Advanced MRI imaging of the brain was performed revealing a small area of acute infarction in the lateral right thalamus with no evidence of hemorrhage or mass-effect.  Patient underwent echocardiogram with bubble study that revealed an LVEF of 65-70%.  CTA of the neck performed on 07/06/2019 revealed 78% stenosis of the proximal right ICA due to both calcified and noncalcified atherosclerotic plaques, no significant left carotid stenosis, and mild stenosis at the origin of the right vertebral artery.  Patient was seen in consult by vascular surgery and RIGHT carotid endarterectomy was recommended, however due to acute stroke, procedure deferred 2 to 3 weeks.  Patient improved during hospital course and was ultimately discharged to inpatient rehab on 07/06/2019 with instructions to follow-up with outpatient cardiology and vascular surgery.  Due to DME issues, patient did not actually leave the hospital until 07/07/2019.  Patient to be followed by outpatient neurology.  Patient was seen in consult on 07/29/2019 by Dr. Leotis Pain; notes reviewed.  At the time of this clinic visit, patient had improved following inpatient rehab, however was still having LLE weakness and numbness.  CTA was reviewed by the vascular surgeon who indicated that the right ICA stenosis appeared worse than what was reported initially.  MD notes that right ICA is "densely calcific and the anatomy is not favorable for carotid stenting".  Discussed carotid endarterectomy patient agrees with plan of care.  Endarterectomy was subsequently performed on 08/04/2019, and was discharged the following day after an uncomplicated surgical course.  Patient is followed by cardiology Saralyn Pilar, MD). He was last seen in the cardiology clinic on  11/02/2019; notes reviewed.  At the time of this clinic visit, patient was  doing well from a cardiovascular standpoint.  Patient denied chest pain, shortness of breath, PND, orthopnea, palpitations, peripheral edema, vertiginous symptoms, or presyncope/syncope.  Patient makes efforts to remain active and walks half a mile per day.  He is participating in both physical and occupational therapy twice a week.  Following his CVA and 07/2019, patient reports that he experiences fatigue easily.  Last ECG performed in 07/2019 revealed NSR at a rate of 74 bpm without any evidence of ST or T wave abnormalities.  Blood pressure well controlled on currently prescribed diuretic monotherapy; blood pressure 122/60 in the office.  Patient is on a statin for his HLD.  No changes were made to patient's medication regimen.  Patient scheduled follow-up with outpatient cardiology in 6 months.  Russell Herman is scheduled to undergo an elective orthopedic procedure on 01/09/2020 with Dr. Leim Fabry.  Given patient's past medical history significant for cardiovascular issues, including recent acute CVA, presurgical clearances were deemed to be necessary by PAT team.  Specialty clearances for the planned procedure have been obtained as follows:   Per family medicine Kym Groom, MD), "patient has been cleared by his neurologist and cardiologist to proceed with surgery.  A1c mildly elevated 6.4%, however I do not believe this will stop the surgery.  Exam appears to be unremarkable.  Patient is medically optimized proceed with the surgery as planned".   Per neurology Kristine Linea, NP-C), "patient is optimized for surgery at this point.  Patient is at a MODERATE risk for surgery. He may hold his anticoagulation therapy for up to 5 days prior to his procedure and resume immediately after".  He has been instructed on recommendations for holding his DAPT therapy for 5 days prior to his procedure. The patient has been instructed that his last dose of his anticoagulant will be on 01/04/2020.   Per cardiology Margarito Courser,  PA-C), "patient is optimized for surgery.  From a cardiac standpoint, the patient does not need any further cardiac diagnostics, and may proceed with shoulder surgery as planned". Cardiology APP requesting that patient remain on his daily low-dose ASA if possible, however if not possible, this medication should be resumed after surgery as soon as possible.  She also advises that patient DOES NOT need to resume his clopidogrel postoperatively.  He denies previous perioperative complications with anesthesia. He underwent a general anesthetic course here (ASA III) in 08/2019 with no documented complications.   Vitals with BMI 12/23/2019 12/23/2019 12/13/2019  Height 5\' 6"  5\' 6"  5\' 6"   Weight 147 lbs 147 lbs 3 oz 147 lbs  BMI 23.74 83.66 29.47  Systolic - 654 650  Diastolic - 84 68  Pulse - 80 71    Providers/Specialists:   NOTE: Primary physician provider listed below. Patient may have been seen by APP or partner within same practice.   PROVIDER ROLE LAST Rossie Muskrat, MD Orthopedic (Surgeon)  12/12/2019  Valera Castle, MD Primary Care Provider  12/21/2019  Isaias Cowman, MD Cardiology  11/02/2019  Leotis Pain, MD Vascular Surgery  12/13/2019  Chipper Herb, NP-C Neurology  11/17/2019   Allergies:  Losartan, Chlorthalidone, Keflex [cephalexin], Statins, and Sulfa antibiotics  Current Home Medications:   No current facility-administered medications for this encounter.   Marland Kitchen acetaminophen (TYLENOL) 325 MG tablet  . aspirin 81 MG EC tablet  . chlorthalidone (HYGROTON) 25 MG tablet  . Cholecalciferol (VITAMIN D) 50 MCG (2000 UT) tablet  .  clopidogrel (PLAVIX) 75 MG tablet  . diphenhydrAMINE (BENADRYL) 25 MG tablet  . fluticasone (FLONASE) 50 MCG/ACT nasal spray  . hydrocortisone cream 1 %  . pantoprazole (PROTONIX) 40 MG tablet  . Polyethylene Glycol 400 (BLINK TEARS OP)  . Psyllium (METAMUCIL PO)  . rosuvastatin (CRESTOR) 40 MG tablet   History:   Past Medical  History:  Diagnosis Date  . Adenomatous colon polyp   . Allergy   . Arthritis   . Cancer ([place])    basal cell skin  . Carotid stenosis   . Coronary artery disease   . GERD (gastroesophageal reflux disease)   . HLD (hyperlipidemia)   . Hypertension   . Pre-diabetes   . Stroke ([place])   . Wears hearing aid in both ears    Past Surgical History:  Procedure Laterality Date  . cyst removed    . ENDARTERECTOMY Right 08/04/2019   Procedure: ENDARTERECTOMY CAROTID;  Surgeon: Algernon Huxley, MD;  Location: [facility] ORS;  Service: Vascular;  Laterality: Right;  . EXCISION OF TONGUE LESION Bilateral 05/05/2019   Procedure: EXCISION OF DORSAL TONGUE LESION;  Surgeon: Margaretha Sheffield, MD;  Location: [place];  Service: ENT;  Laterality: Bilateral;  . skin cancer removed     Family History  Problem Relation Age of Onset  . Breast cancer Mother   . Melanoma Father        mets to lung  . Breast cancer Sister   . Kidney failure Brother        s/p transplant   Social History   Tobacco Use  . Smoking status: Never Smoker  . Smokeless tobacco: Never Used  . Tobacco comment: smoked "some" as teenager  Vaping Use  . Vaping Use: Never used  Substance Use Topics  . Alcohol use: Not Currently    Alcohol/week: 0.0 standard drinks  . Drug use: Not Currently    Pertinent Clinical Results:  LABS: Labs reviewed: Acceptable for surgery.  Note that patient is coming on on 01/05/2020 for additional labs (CBC). Will plan on reviewing labs as they result for this patient      ECG: Date: 07/05/2019 Time ECG obtained: 0818 AM Rate: 74 bpm Rhythm: normal sinus Axis (leads I and aVF): Normal Intervals: PR 196 ms. QRS 94 ms. QTc 450 ms. ST segment and T wave changes: No evidence of acute ST segment elevation or depression Comparison: No previous tracings available for review and comparison.   IMAGING / PROCEDURES: BILATERAL CAROTID DUPLEX done on 12/13/2019 1. Right Carotid: Velocities in  the right ICA are consistent with a 1-39% stenosis.  2. Left Carotid: Velocities in the left ICA are consistent with a 1-39% stenosis.  3. Vertebrals:  Bilateral vertebral arteries demonstrate antegrade flow.  4. Subclavians: Normal flow hemodynamics were seen in bilateral subclavian arteries.   MRI SHOULDER LEFT WO CONTRAST done on 12/03/2019 1. Significant tendinopathy with interstitial tears and partial-thickness bursal surface tears along the musculotendinous junction region of the supraspinatus tendon. 2. Associated extensive tearing of the subscapularis muscle back into the muscle belly.  3. There is also fluid surrounding the muscle and a focal fluid collection which could be a liquified hematoma or ganglion cyst. 4. Intact long head biceps tendon and glenoid labrum. 5. Moderate AC joint degenerative changes but no other significant findings for bony impingement. 6. Moderate subacromial/subdeltoid bursitis.  MRI BRAIN WO CONTRAST done on 12/03/2019 1. No acute intracranial process.  2. Remote right thalamic lacunar insults. 3. Mild cerebral atrophy.  4. Mild chronic microvascular ischemic changes.  CT ANGIOGRAPHY NECK W/WO CONTRAST done on 07/06/2019 1. 78% diameter stenosis proximal right internal carotid artery due to calcified and noncalcified atherosclerotic plaque. 2. No significant left carotid stenosis 3. Mild stenosis origin of right vertebral artery.  4. Left vertebral artery widely patent.  MRI BRAIN WO CONTRAST done on 07/05/2019 1. Small area of acute infarction in the lateral right thalamus.  2. Mild chronic small-vessel change of the white matter otherwise.  3. No hemorrhage or mass effect.  CT HEAD WO CONTRAST done on 07/05/2019 1. No acute intracranial hemorrhage, mass effect, or evidence of acute infarction.  ECHOCARDIOGRAM COMPLETE BUBBLE STUDY done on 07/06/2019 1. Left ventricular ejection fraction, by estimation, is 65 to 70%. The left ventricle has  normal function. The left ventricle has no regional wall motion abnormalities. Left ventricular diastolic parameters are consistent with Grade I diastolic dysfunction (impaired relaxation).  2. Right ventricular systolic function is normal. The right ventricular size is normal. There is normal pulmonary artery systolic pressure.  3. The mitral valve is normal in structure. No evidence of mitral valve regurgitation. No evidence of mitral stenosis.  4. The aortic valve is normal in structure. Aortic valve regurgitation is not visualized. No aortic stenosis is present.  5. The inferior vena cava is normal in size with greater than 50% respiratory variability, suggesting right atrial pressure of 3 mmHg.  6. Agitated saline contrast bubble study was negative, with no evidence of any interatrial shunt.    Impression and Plan:  MACK ALVIDREZ has been referred for pre-anesthesia review and clearance prior to him undergoing the planned anesthetic and procedural courses. Available labs, pertinent testing, and imaging results were personally reviewed by me. This patient has been appropriately cleared by family medicine Kym Groom, MD), neurology Kristine Linea, NP-C), and cardiology Margarito Courser, PA-C).   Based on clinical review performed today (12/28/19), barring any significant acute changes in the patient's overall condition, it is anticipated that he will be able to proceed with the planned surgical intervention. Any acute changes in clinical condition may necessitate his procedure being postponed and/or cancelled. Pre-surgical instructions were reviewed with the patient during his PAT appointment and questions were fielded by PAT clinical staff.  Honor Loh, MSN, APRN, FNP-C, CEN [facility]  Peri-operative Services Nurse Practitioner Phone: [phone] 12/28/19 4:55 PM  NOTE: This note has been prepared using Dragon dictation software. Despite my best ability to proofread, there is always the  potential that unintentional transcriptional errors may still occur from this process.

## 2019-12-30 ENCOUNTER — Other Ambulatory Visit: Payer: Medicare Other

## 2020-01-03 ENCOUNTER — Ambulatory Visit: Payer: Medicare Other | Admitting: Occupational Therapy

## 2020-01-03 ENCOUNTER — Ambulatory Visit: Payer: Medicare Other | Admitting: Physical Therapy

## 2020-01-05 ENCOUNTER — Ambulatory Visit: Payer: Medicare Other | Admitting: Occupational Therapy

## 2020-01-05 ENCOUNTER — Other Ambulatory Visit
Admission: RE | Admit: 2020-01-05 | Discharge: 2020-01-05 | Disposition: A | Discharge status: Home / Self Care | Payer: Medicare Other | Source: Ambulatory Visit | Attending: Orthopedic Surgery | Admitting: Orthopedic Surgery

## 2020-01-05 ENCOUNTER — Ambulatory Visit: Payer: Medicare Other | Admitting: Physical Therapy

## 2020-01-05 ENCOUNTER — Other Ambulatory Visit: Payer: Self-pay

## 2020-01-05 DIAGNOSIS — Z20822 Contact with and (suspected) exposure to covid-19: Secondary | ICD-10-CM | POA: Insufficient documentation

## 2020-01-05 DIAGNOSIS — Z01812 Encounter for preprocedural laboratory examination: Secondary | ICD-10-CM | POA: Insufficient documentation

## 2020-01-05 LAB — SARS CORONAVIRUS 2 (TAT 6-24 HRS): SARS Coronavirus 2: NEGATIVE

## 2020-01-05 LAB — CBC
HCT: 41 % (ref 39.0–52.0)
Hemoglobin: 14.2 g/dL (ref 13.0–17.0)
MCH: 29.7 pg (ref 26.0–34.0)
MCHC: 34.6 g/dL (ref 30.0–36.0)
MCV: 85.8 fL (ref 80.0–100.0)
Platelets: 323 10*3/uL (ref 150–400)
RBC: 4.78 MIL/uL (ref 4.22–5.81)
RDW: 12.7 % (ref 11.5–15.5)
WBC: 8.5 10*3/uL (ref 4.0–10.5)
nRBC: 0 % (ref 0.0–0.2)

## 2020-01-08 MED ORDER — ORAL CARE MOUTH RINSE: 15.0000 mL | Freq: Once | OROMUCOSAL | Status: AC

## 2020-01-08 MED ORDER — CHLORHEXIDINE GLUCONATE 0.12 % MT SOLN: 15.0000 mL | Freq: Once | OROMUCOSAL | Status: AC

## 2020-01-08 MED ORDER — CEFAZOLIN SODIUM-DEXTROSE 2-4 GM/100ML-% IV SOLN
2.0000 g | Freq: Once | INTRAVENOUS | Status: AC
  Administered 2020-01-09: 2 g via INTRAVENOUS

## 2020-01-08 MED ORDER — LACTATED RINGERS IV SOLN: INTRAVENOUS | Status: DC

## 2020-01-09 ENCOUNTER — Ambulatory Visit: Payer: Medicare Other | Admitting: Urgent Care

## 2020-01-09 ENCOUNTER — Ambulatory Visit
Admission: RE | Admit: 2020-01-09 | Discharge: 2020-01-09 | Disposition: A | Discharge status: Home / Self Care | Payer: Medicare Other | Attending: Orthopedic Surgery | Admitting: Orthopedic Surgery

## 2020-01-09 ENCOUNTER — Other Ambulatory Visit: Payer: Self-pay

## 2020-01-09 ENCOUNTER — Ambulatory Visit: Payer: Medicare Other

## 2020-01-09 ENCOUNTER — Encounter: Payer: Self-pay | Admitting: Orthopedic Surgery

## 2020-01-09 ENCOUNTER — Encounter
Admission: RE | Disposition: A | Discharge status: Home / Self Care | Payer: Self-pay | Source: Home / Self Care | Attending: Orthopedic Surgery

## 2020-01-09 DIAGNOSIS — Z419 Encounter for procedure for purposes other than remedying health state, unspecified: Secondary | ICD-10-CM

## 2020-01-09 DIAGNOSIS — Z7982 Long term (current) use of aspirin: Secondary | ICD-10-CM | POA: Insufficient documentation

## 2020-01-09 DIAGNOSIS — Z881 Allergy status to other antibiotic agents status: Secondary | ICD-10-CM | POA: Insufficient documentation

## 2020-01-09 DIAGNOSIS — Z7902 Long term (current) use of antithrombotics/antiplatelets: Secondary | ICD-10-CM | POA: Diagnosis not present

## 2020-01-09 DIAGNOSIS — Z79899 Other long term (current) drug therapy: Secondary | ICD-10-CM | POA: Diagnosis not present

## 2020-01-09 DIAGNOSIS — Z882 Allergy status to sulfonamides status: Secondary | ICD-10-CM | POA: Insufficient documentation

## 2020-01-09 DIAGNOSIS — M25519 Pain in unspecified shoulder: Secondary | ICD-10-CM

## 2020-01-09 DIAGNOSIS — M7582 Other shoulder lesions, left shoulder: Secondary | ICD-10-CM | POA: Insufficient documentation

## 2020-01-09 DIAGNOSIS — M75122 Complete rotator cuff tear or rupture of left shoulder, not specified as traumatic: Secondary | ICD-10-CM | POA: Diagnosis present

## 2020-01-09 DIAGNOSIS — Z888 Allergy status to other drugs, medicaments and biological substances status: Secondary | ICD-10-CM | POA: Diagnosis not present

## 2020-01-09 DIAGNOSIS — M75112 Incomplete rotator cuff tear or rupture of left shoulder, not specified as traumatic: Secondary | ICD-10-CM | POA: Diagnosis not present

## 2020-01-09 HISTORY — DX: Hyperlipidemia, unspecified: E78.5

## 2020-01-09 HISTORY — PX: SHOULDER ARTHROSCOPY WITH ROTATOR CUFF REPAIR AND SUBACROMIAL DECOMPRESSION: SHX5686

## 2020-01-09 HISTORY — DX: Occlusion and stenosis of unspecified carotid artery: I65.29

## 2020-01-09 SURGERY — SHOULDER ARTHROSCOPY WITH ROTATOR CUFF REPAIR AND SUBACROMIAL DECOMPRESSION
Anesthesia: General | Site: Shoulder | Laterality: Left

## 2020-01-09 MED ORDER — FENTANYL CITRATE (PF) 250 MCG/5ML IJ SOLN
INTRAMUSCULAR | Status: AC
  Filled 2020-01-09: qty 5

## 2020-01-09 MED ORDER — DEXAMETHASONE SODIUM PHOSPHATE 10 MG/ML IJ SOLN
INTRAMUSCULAR | Status: DC | PRN
  Administered 2020-01-09: 10 mg via INTRAVENOUS

## 2020-01-09 MED ORDER — SEVOFLURANE IN SOLN
RESPIRATORY_TRACT | Status: AC
  Filled 2020-01-09: qty 250

## 2020-01-09 MED ORDER — DEXAMETHASONE SODIUM PHOSPHATE 10 MG/ML IJ SOLN
INTRAMUSCULAR | Status: AC
  Filled 2020-01-09: qty 1

## 2020-01-09 MED ORDER — MIDAZOLAM HCL 2 MG/2ML IJ SOLN
INTRAMUSCULAR | Status: AC
  Administered 2020-01-09: 1 mg via INTRAVENOUS
  Filled 2020-01-09: qty 2

## 2020-01-09 MED ORDER — CEFAZOLIN SODIUM-DEXTROSE 2-4 GM/100ML-% IV SOLN
INTRAVENOUS | Status: AC
  Filled 2020-01-09: qty 100

## 2020-01-09 MED ORDER — PHENYLEPHRINE HCL (PRESSORS) 10 MG/ML IV SOLN
INTRAVENOUS | Status: DC | PRN
  Administered 2020-01-09 (×3): 200 ug via INTRAVENOUS

## 2020-01-09 MED ORDER — MIDAZOLAM HCL 2 MG/2ML IJ SOLN: 1.0000 mg | Freq: Once | INTRAMUSCULAR | Status: AC

## 2020-01-09 MED ORDER — FENTANYL CITRATE (PF) 100 MCG/2ML IJ SOLN: 25.0000 ug | INTRAMUSCULAR | Status: DC | PRN

## 2020-01-09 MED ORDER — BUPIVACAINE LIPOSOME 1.3 % IJ SUSP
INTRAMUSCULAR | Status: AC
  Filled 2020-01-09: qty 20

## 2020-01-09 MED ORDER — ASPIRIN EC 325 MG PO TBEC: 325.0000 mg | DELAYED_RELEASE_TABLET | Freq: Every day | ORAL | 0 refills | Status: AC

## 2020-01-09 MED ORDER — PROPOFOL 10 MG/ML IV BOLUS
INTRAVENOUS | Status: AC
  Filled 2020-01-09: qty 20

## 2020-01-09 MED ORDER — ONDANSETRON HCL 4 MG/2ML IJ SOLN: 4.0000 mg | Freq: Once | INTRAMUSCULAR | Status: DC | PRN

## 2020-01-09 MED ORDER — SUGAMMADEX SODIUM 200 MG/2ML IV SOLN
INTRAVENOUS | Status: DC | PRN
  Administered 2020-01-09: 200 mg via INTRAVENOUS

## 2020-01-09 MED ORDER — BUPIVACAINE HCL (PF) 0.5 % IJ SOLN
INTRAMUSCULAR | Status: AC
  Filled 2020-01-09: qty 10

## 2020-01-09 MED ORDER — OXYCODONE HCL 5 MG PO TABS
5.0000 mg | ORAL_TABLET | ORAL | 0 refills | Status: AC | PRN
Start: 2020-01-09 — End: 2021-01-08

## 2020-01-09 MED ORDER — BUPIVACAINE HCL (PF) 0.5 % IJ SOLN
INTRAMUSCULAR | Status: DC | PRN
  Administered 2020-01-09: 10 mL via PERINEURAL

## 2020-01-09 MED ORDER — LIDOCAINE HCL (CARDIAC) PF 100 MG/5ML IV SOSY
PREFILLED_SYRINGE | INTRAVENOUS | Status: DC | PRN
  Administered 2020-01-09: 50 mg via INTRAVENOUS

## 2020-01-09 MED ORDER — ROCURONIUM BROMIDE 10 MG/ML (PF) SYRINGE
PREFILLED_SYRINGE | INTRAVENOUS | Status: AC
  Filled 2020-01-09: qty 10

## 2020-01-09 MED ORDER — LIDOCAINE HCL (PF) 1 % IJ SOLN
INTRAMUSCULAR | Status: DC | PRN
  Administered 2020-01-09: 5 mL via SUBCUTANEOUS

## 2020-01-09 MED ORDER — ONDANSETRON HCL 4 MG/2ML IJ SOLN
INTRAMUSCULAR | Status: DC | PRN
  Administered 2020-01-09: 4 mg via INTRAVENOUS

## 2020-01-09 MED ORDER — CHLORHEXIDINE GLUCONATE 0.12 % MT SOLN
OROMUCOSAL | Status: AC
  Administered 2020-01-09: 15 mL via OROMUCOSAL
  Filled 2020-01-09: qty 15

## 2020-01-09 MED ORDER — EPINEPHRINE PF 1 MG/ML IJ SOLN
INTRAMUSCULAR | Status: AC
  Filled 2020-01-09: qty 4

## 2020-01-09 MED ORDER — ROCURONIUM BROMIDE 100 MG/10ML IV SOLN
INTRAVENOUS | Status: DC | PRN
  Administered 2020-01-09: 100 mg via INTRAVENOUS

## 2020-01-09 MED ORDER — BUPIVACAINE LIPOSOME 1.3 % IJ SUSP
INTRAMUSCULAR | Status: DC | PRN
  Administered 2020-01-09: 20 mL via PERINEURAL

## 2020-01-09 MED ORDER — ACETAMINOPHEN 10 MG/ML IV SOLN
INTRAVENOUS | Status: AC
  Filled 2020-01-09: qty 100

## 2020-01-09 MED ORDER — FENTANYL CITRATE (PF) 100 MCG/2ML IJ SOLN
INTRAMUSCULAR | Status: DC | PRN
  Administered 2020-01-09: 100 ug via INTRAVENOUS
  Administered 2020-01-09: 50 ug via INTRAVENOUS
  Administered 2020-01-09: 100 ug via INTRAVENOUS

## 2020-01-09 MED ORDER — FENTANYL CITRATE (PF) 100 MCG/2ML IJ SOLN: 50.0000 ug | Freq: Once | INTRAMUSCULAR | Status: AC

## 2020-01-09 MED ORDER — ACETAMINOPHEN 500 MG PO TABS: 1000.0000 mg | ORAL_TABLET | Freq: Three times a day (TID) | ORAL | 2 refills | Status: AC

## 2020-01-09 MED ORDER — BUPIVACAINE HCL (PF) 0.5 % IJ SOLN
INTRAMUSCULAR | Status: AC
  Filled 2020-01-09: qty 30

## 2020-01-09 MED ORDER — ONDANSETRON HCL 4 MG/2ML IJ SOLN
INTRAMUSCULAR | Status: AC
  Filled 2020-01-09: qty 2

## 2020-01-09 MED ORDER — LACTATED RINGERS IV SOLN
INTRAVENOUS | Status: DC | PRN
  Administered 2020-01-09: 12000 mL

## 2020-01-09 MED ORDER — LIDOCAINE HCL (PF) 1 % IJ SOLN
INTRAMUSCULAR | Status: AC
  Filled 2020-01-09: qty 5

## 2020-01-09 MED ORDER — FENTANYL CITRATE (PF) 100 MCG/2ML IJ SOLN
INTRAMUSCULAR | Status: AC
  Administered 2020-01-09: 50 ug via INTRAVENOUS
  Filled 2020-01-09: qty 2

## 2020-01-09 MED ORDER — PROPOFOL 10 MG/ML IV BOLUS
INTRAVENOUS | Status: DC | PRN
  Administered 2020-01-09: 100 mg via INTRAVENOUS

## 2020-01-09 MED ORDER — ONDANSETRON 4 MG PO TBDP: 4.0000 mg | ORAL_TABLET | Freq: Three times a day (TID) | ORAL | 0 refills | Status: DC | PRN

## 2020-01-09 MED ORDER — SODIUM CHLORIDE 0.9 % IV SOLN
INTRAVENOUS | Status: DC | PRN
  Administered 2020-01-09: 40 ug/min via INTRAVENOUS

## 2020-01-09 MED ORDER — LIDOCAINE HCL (PF) 2 % IJ SOLN
INTRAMUSCULAR | Status: AC
  Filled 2020-01-09: qty 5

## 2020-01-09 SURGICAL SUPPLY — 91 items
ADAPTER IRRIG TUBE 2 SPIKE SOL (ADAPTER) ×6 IMPLANT
ADH SKN CLS APL DERMABOND .7 (GAUZE/BANDAGES/DRESSINGS)
ADPR TBG 2 SPK PMP STRL ASCP (ADAPTER) ×2
ANCH SUT BN ASCP DLV (Anchor) ×1 IMPLANT
ANCH SUT RGNRT REGENETEN (Staple) ×1 IMPLANT
ANCH SUT SHRT 12.5 CANN EYLT (Anchor) ×1 IMPLANT
ANCHOR BONE REGENETEN (Anchor) ×3 IMPLANT
ANCHOR SUT BIOCOMP LK 2.9X12.5 (Anchor) ×3 IMPLANT
ANCHOR TENDON REGENETEN (Staple) ×3 IMPLANT
APL PRP STRL LF DISP 70% ISPRP (MISCELLANEOUS) ×1
BNDG ADH 2 X3.75 FABRIC TAN LF (GAUZE/BANDAGES/DRESSINGS) ×3 IMPLANT
BNDG ADH XL 3.75X2 STRCH LF (GAUZE/BANDAGES/DRESSINGS) ×1
BUR BR 5.5 12 FLUTE (BURR) ×3 IMPLANT
BUR RADIUS 4.0X18.5 (BURR) ×3 IMPLANT
CANNULA 5.75X7CM (CANNULA) ×1
CANNULA PART THRD DISP 5.75X7 (CANNULA) ×2 IMPLANT
CANNULA PARTIAL THREAD 2X7 (CANNULA) ×3 IMPLANT
CANNULA TWIST IN 8.25X9CM (CANNULA) IMPLANT
CHLORAPREP W/TINT 26 (MISCELLANEOUS) ×3 IMPLANT
COOLER POLAR GLACIER W/PUMP (MISCELLANEOUS) ×3 IMPLANT
COVER WAND RF STERILE (DRAPES) ×3 IMPLANT
DERMABOND ADVANCED (GAUZE/BANDAGES/DRESSINGS)
DERMABOND ADVANCED .7 DNX12 (GAUZE/BANDAGES/DRESSINGS) IMPLANT
DRAPE 3/4 80X56 (DRAPES) ×3 IMPLANT
DRAPE IMP U-DRAPE 54X76 (DRAPES) ×6 IMPLANT
DRAPE INCISE IOBAN 66X45 STRL (DRAPES) ×3 IMPLANT
DRAPE U-SHAPE 47X51 STRL (DRAPES) ×6 IMPLANT
DRSG TEGADERM 4X4.75 (GAUZE/BANDAGES/DRESSINGS) ×3 IMPLANT
ELECT REM PT RETURN 9FT ADLT (ELECTROSURGICAL) ×3
ELECTRODE REM PT RTRN 9FT ADLT (ELECTROSURGICAL) ×1 IMPLANT
GAUZE SPONGE 4X4 12PLY STRL (GAUZE/BANDAGES/DRESSINGS) ×3 IMPLANT
GAUZE XEROFORM 1X8 LF (GAUZE/BANDAGES/DRESSINGS) ×3 IMPLANT
GLOVE BIO SURGEON STRL SZ7.5 (GLOVE) ×3 IMPLANT
GLOVE BIOGEL PI IND STRL 8 (GLOVE) ×2 IMPLANT
GLOVE BIOGEL PI INDICATOR 8 (GLOVE) ×4
GLOVE SURG ORTHO 8.0 STRL STRW (GLOVE) ×3 IMPLANT
GLOVE SURG SYN 8.0 (GLOVE) ×3 IMPLANT
GOWN STRL REUS W/ TWL LRG LVL3 (GOWN DISPOSABLE) ×2 IMPLANT
GOWN STRL REUS W/TWL LRG LVL3 (GOWN DISPOSABLE) ×6
GOWN STRL REUS W/TWL XL LVL4 (GOWN DISPOSABLE) ×3 IMPLANT
IMPL REGENETEN MEDIUM (Shoulder) ×1 IMPLANT
IMPLANT REGENETEN MEDIUM (Shoulder) ×3 IMPLANT
IV LACTATED RINGER IRRG 3000ML (IV SOLUTION) ×36
IV LR IRRIG 3000ML ARTHROMATIC (IV SOLUTION) ×12 IMPLANT
KIT CORKSCREW KNTLS 3.9 S/T/P (INSTRUMENTS) IMPLANT
KIT INSERTION 2.9 PUSHLOCK (KITS) ×3 IMPLANT
KIT STABILIZATION SHOULDER (MISCELLANEOUS) ×3 IMPLANT
KIT SUTURETAK 3.0 INSERT PERC (KITS) IMPLANT
KIT TURNOVER KIT A (KITS) ×3 IMPLANT
MANIFOLD NEPTUNE II (INSTRUMENTS) ×6 IMPLANT
MASK FACE SPIDER DISP (MASK) ×3 IMPLANT
MAT ABSORB  FLUID 56X50 GRAY (MISCELLANEOUS) ×4
MAT ABSORB FLUID 56X50 GRAY (MISCELLANEOUS) ×2 IMPLANT
NDL MAYO CATGUT SZ5 (NEEDLE)
NDL SAFETY ECLIPSE 18X1.5 (NEEDLE) ×1 IMPLANT
NDL SUT 5 .5 CRC TPR PNT MAYO (NEEDLE) IMPLANT
NEEDLE HYPO 18GX1.5 SHARP (NEEDLE) ×3
NEEDLE SCORPION MULTI FIRE (NEEDLE) IMPLANT
PACK ARTHROSCOPY SHOULDER (MISCELLANEOUS) ×3 IMPLANT
PAD ABD DERMACEA PRESS 5X9 (GAUZE/BANDAGES/DRESSINGS) ×3 IMPLANT
PAD ARMBOARD 7.5X6 YLW CONV (MISCELLANEOUS) ×3 IMPLANT
PAD WRAPON POLAR SHDR XLG (MISCELLANEOUS) ×1 IMPLANT
PENCIL SMOKE ULTRAEVAC 22 CON (MISCELLANEOUS) ×3 IMPLANT
SET TUBE SUCT SHAVER OUTFL 24K (TUBING) ×3 IMPLANT
SET TUBE TIP INTRA-ARTICULAR (MISCELLANEOUS) ×3 IMPLANT
SLING ULTRA II M (MISCELLANEOUS) ×3 IMPLANT
STAPLER SKIN PROX 35W (STAPLE) IMPLANT
STRAP SAFETY 5IN WIDE (MISCELLANEOUS) ×3 IMPLANT
SUT ETHILON 3-0 (SUTURE) ×3 IMPLANT
SUT ETHILON 3-0 FS-10 30 BLK (SUTURE) ×12
SUT LASSO 90 DEG CVD (SUTURE) IMPLANT
SUT LASSO 90 DEG SD STR (SUTURE) IMPLANT
SUT MNCRL 4-0 (SUTURE)
SUT MNCRL 4-0 27XMFL (SUTURE)
SUT PROLENE 0 CT 1 30 (SUTURE) ×3 IMPLANT
SUT PROLENE 0 CT 2 (SUTURE) ×3 IMPLANT
SUT VIC AB 0 CT1 36 (SUTURE) IMPLANT
SUT VIC AB 2-0 CT2 27 (SUTURE) ×3 IMPLANT
SUTURE EHLN 3-0 FS-10 30 BLK (SUTURE) ×4 IMPLANT
SUTURE MNCRL 4-0 27XMF (SUTURE) IMPLANT
SUTURE TAPE 1.3 40 TPR END (SUTURE) ×1 IMPLANT
SUTURE TAPE FIBERLINK 1.3 LOOP (SUTURE) ×1 IMPLANT
SUTURETAPE 1.3 40 TPR END (SUTURE) ×3
SUTURETAPE FIBERLINK 1.3 LOOP (SUTURE) ×3
TAPE CLOTH 3X10 WHT NS LF (GAUZE/BANDAGES/DRESSINGS) ×3 IMPLANT
TAPE MICROFOAM 4IN (TAPE) ×3 IMPLANT
TUBING ARTHRO INFLOW-ONLY STRL (TUBING) ×3 IMPLANT
TUBING CONNECTING 10 (TUBING) IMPLANT
TUBING CONNECTING 10' (TUBING)
WAND WEREWOLF FLOW 90D (MISCELLANEOUS) ×3 IMPLANT
WRAPON POLAR PAD SHDR XLG (MISCELLANEOUS) ×3

## 2020-01-09 NOTE — Transfer of Care (Signed)
Immediate Anesthesia Transfer of Care Note  Patient: Elenora Gamma  Procedure(s) Performed: Left shoulder arthroscopic vs mini-open rotator cuff repair with possible allograft vs Regeneten patch, subacromial decompression, and biceps tenodesis - Reche Dixon to Assist (Left Shoulder)  Patient Location: PACU  Anesthesia Type:GA combined with regional for post-op pain  Level of Consciousness: sedated  Airway & Oxygen Therapy: Patient Spontanous Breathing and Patient connected to face mask oxygen  Post-op Assessment: Report given to RN and Post -op Vital signs reviewed and stable  Post vital signs: Reviewed and stable  Last Vitals:  Vitals Value Taken Time  BP 111/53 01/09/20 1032  Temp    Pulse 63 01/09/20 1034  Resp    SpO2 99 % 01/09/20 1034  Vitals shown include unvalidated device data.  Last Pain:  Vitals:   01/09/20 0623  TempSrc: Tympanic  PainSc: 3          Complications: No complications documented.

## 2020-01-09 NOTE — Anesthesia Procedure Notes (Signed)
Anesthesia Regional Block: Interscalene brachial plexus block   Pre-Anesthetic Checklist: ,, timeout performed, Correct Patient, Correct Site, Correct Laterality, Correct Procedure, Correct Position, site marked, Risks and benefits discussed,  Surgical consent,  Pre-op evaluation,  At surgeon's request and post-op pain management  Laterality: Left and Upper  Prep: chloraprep       Needles:  Injection technique: Single-shot  Needle Type: Stimiplex     Needle Length: 5cm  Needle Gauge: 22     Additional Needles:   Procedures:,,,, ultrasound used (permanent image in chart),,,,  Narrative:  Start time: 01/09/2020 7:34 AM End time: 01/09/2020 7:38 AM Injection made incrementally with aspirations every 5 mL.  Performed by: Personally  Anesthesiologist: Martha Clan, MD  Additional Notes: Functioning IV was confirmed and monitors were applied.  A 29mm 22ga Stimuplex needle was used. Sterile prep and drape,hand hygiene and sterile gloves were used.  Negative aspiration and negative test dose prior to incremental administration of local anesthetic. The patient tolerated the procedure well.

## 2020-01-09 NOTE — Progress Notes (Signed)
   01/09/20 0742  Clinical Encounter Type  Visited With Family  Visit Type Initial  Referral From Chaplain  Consult/Referral To Chaplain  While rounding, chaplain briefly visited with [name]. She was concerned about an item that the nurse forgot to take and she gave it to me it to chaplain and she took it to the back and gave it to his nurse. [name] felt much better.

## 2020-01-09 NOTE — Anesthesia Postprocedure Evaluation (Signed)
Anesthesia Post Note  Patient: Russell Herman  Procedure(s) Performed: Left shoulder arthroscopic  rotator cuff repair with Regeneten patch, subacromial decompression, and biceps tenodesis (Left Shoulder)  Patient location during evaluation: PACU Anesthesia Type: General and Regional Level of consciousness: awake and alert Pain management: pain level controlled Vital Signs Assessment: post-procedure vital signs reviewed and stable Respiratory status: spontaneous breathing, nonlabored ventilation, respiratory function stable and patient connected to nasal cannula oxygen Cardiovascular status: blood pressure returned to baseline and stable Postop Assessment: no apparent nausea or vomiting Anesthetic complications: no   No complications documented.   Last Vitals:  Vitals:   01/09/20 1202 01/09/20 1212  BP: 111/68 124/63  Pulse: 69 71  Resp: 15 16  Temp:  (!) 36.1 C  SpO2: 97% 96%    Last Pain:  Vitals:   01/09/20 1212  TempSrc: Tympanic  PainSc: 0-No pain                 Martha Clan

## 2020-01-09 NOTE — Anesthesia Preprocedure Evaluation (Addendum)
Anesthesia Evaluation  Patient identified by MRN, date of birth, ID band Patient awake    Reviewed: Allergy & Precautions, H&P , NPO status , reviewed documented beta blocker date and time   History of Anesthesia Complications Negative for: history of anesthetic complications  Airway Mallampati: III  TM Distance: <3 FB Neck ROM: limited    Dental  (+) Partial Lower, Chipped, Missing, Caps, Dental Advidsory Given   Pulmonary neg pulmonary ROS,    Pulmonary exam normal        Cardiovascular Exercise Tolerance: Good hypertension, (-) angina+ CAD  (-) Past MI and (-) Cardiac Stents Normal cardiovascular exam(-) dysrhythmias (-) Valvular Problems/Murmurs  07/2019 ECHO 1. Left ventricular ejection fraction, by estimation, is 65 to 70%. The  left ventricle has normal function. The left ventricle has no regional  wall motion abnormalities. Left ventricular diastolic parameters are  consistent with Grade I diastolic  dysfunction (impaired relaxation).  2. Right ventricular systolic function is normal. The right ventricular  size is normal. There is normal pulmonary artery systolic pressure.  3. The mitral valve is normal in structure. No evidence of mitral valve  regurgitation. No evidence of mitral stenosis.  4. The aortic valve is normal in structure. Aortic valve regurgitation is  not visualized. No aortic stenosis is present.  5. The inferior vena cava is normal in size with greater than 50%  respiratory variability, suggesting right atrial pressure of 3 mmHg.  6. Agitated saline contrast bubble study was negative, with no evidence  of any interatrial shunt.    Neuro/Psych neg Seizures Some mild residual L leg/arm stiffness/weakness TIACVA, Residual Symptoms    GI/Hepatic Neg liver ROS, GERD  Medicated and Controlled,  Endo/Other  diabetes (borderline)  Renal/GU negative Renal ROS     Musculoskeletal  (+) Arthritis ,    Abdominal   Peds  Hematology   Anesthesia Other Findings Past Medical History: No date: Adenomatous colon polyp No date: Allergy No date: Arthritis No date: Cancer ([place]) No date: Coronary artery disease No date: GERD (gastroesophageal reflux disease) No date: Hypertension No date: Wears hearing aid in both ears Past Surgical History: No date: cyst removed 05/05/2019: EXCISION OF TONGUE LESION; Bilateral     Comment:  Procedure: EXCISION OF DORSAL TONGUE LESION;  Surgeon:               Margaretha Sheffield, MD;  Location: [facility];                Service: ENT;  Laterality: Bilateral; No date: skin cancer removed   Reproductive/Obstetrics                             Anesthesia Physical  Anesthesia Plan  ASA: III  Anesthesia Plan: General   Post-op Pain Management:  Regional for Post-op pain   Induction: Intravenous  PONV Risk Score and Plan: Ondansetron, Treatment may vary due to age or medical condition and Dexamethasone  Airway Management Planned: Oral ETT  Additional Equipment:   Intra-op Plan:   Post-operative Plan: Extubation in OR  Informed Consent: I have reviewed the patients History and Physical, chart, labs and discussed the procedure including the risks, benefits and alternatives for the proposed anesthesia with the patient or authorized representative who has indicated his/her understanding and acceptance.     Dental Advisory Given  Plan Discussed with: CRNA  Anesthesia Plan Comments:        Anesthesia Quick Evaluation

## 2020-01-09 NOTE — Op Note (Addendum)
SURGERY DATE: 01/09/2020   PRE-OP DIAGNOSIS:  1. Left partial thickness and full-thickness rotator cuff tear 2. Left biceps tendinopathy  POST-OP DIAGNOSIS: 1. Left partial thickness and full-thickness rotator cuff tear 2. Left biceps tendinopathy  PROCEDURES:  1. Left arthroscopic rotator cuff repair with side-to-side repair and Regeneten patch application 2. Left arthroscopic biceps tenodesis 3. Left extensive debridement of shoulder (glenohumeral and subacromial spaces)  SURGEON: Russell Mulligan, MD  ASSISTANT: Russell Lauth, PA  ANESTHESIA: Gen with interscalene block w/Exparil  ESTIMATED BLOOD LOSS: 5cc  DRAINS:  none  TOTAL IV FLUIDS: per anesthesia   SPECIMENS: none  IMPLANTS:  - [employer] Regeneten patch with associated tendon and bone staples - Arthrex 2.57mm PushLock anchor (arthroscopic biceps tenodesis)  OPERATIVE FINDINGS:  Examination under anesthesia: A careful examination under anesthesia was performed.  Passive range of motion was: FF: 150; ER at side: 45; ER in abduction: 85; IR in abduction: 45.  Anterior load shift: NT.  Posterior load shift: NT.  Sulcus in neutral: NT.  Sulcus in ER: NT.    Intra-operative findings: A thorough arthroscopic examination of the shoulder was performed.  The findings are: 1. Biceps tendon: Significant tendinopathy with erythema 2. Superior labrum: Type 2 SLAP tear 3. Posterior labrum and capsule: normal 4. Inferior capsule and inferior recess: normal 5. Glenoid cartilage surface: Normal 6. Supraspinatus attachment: partial thickness tearing of anterior supraspinatus at the musculotendinous junction involving approximately 40% of the articular surface.  Additionally there was a high-grade partial-thickness longitudinal tear medial to the level of the glenoid 7. Posterior rotator cuff attachment: normal  8. Humeral head articular cartilage: normal 9. Rotator interval: Synovitic 10: Subscapularis tendon: Normal 11.  Anterior labrum: degenerative tearing 12. IGHL: normal  OPERATIVE REPORT:   Indications for procedure: Russell Herman is a 74 y.o. 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  He has been attending significant physical therapy and rehab and has regained most of his strength on the left side.  Clinical exam and MRI were significant for partial thickness rotator cuff tear and biceps tendinopathy. After discussion of risks, benefits, and alternatives to surgery, the patient elected to proceed with above mentioned procedure. The patient understands that use of the Regeneten patch is relatively new and long-term data is unknown.  Procedure in detail:  I identifiedWilbur H Herman in the pre-operative holding area.  I marked the operative shoulder with my initials. I reviewed the risks and benefits of the proposed surgical intervention, and the patient wished to proceed.  Anesthesia was then performed with an interscalene block with Exparil.  The patient was transferred to the operative suite and placed in the beach chair position.    SCDs were placed on the lower extremities. Appropriate IV antibiotics were administered. The operative upper extremity was then prepped and draped in standard fashion. A time out was performed confirming the correct extremity, correct patient and correct procedure.   I then created a standard posterior portal with an 11 blade. The glenohumeral joint was easily entered with a blunt trochar and the arthroscope introduced. The findings of diagnostic arthroscopy are described above. I debrided the degenerative anterior labrum, superior labrum, and also debrided and coagulated the inflamed synovium to obtain hemostasis and reduce the risk of post-operative swelling using an Arthrocare radiofrequency device.    I then turned my attention to the arthroscopic biceps tenodesis.  I used the loop n tack technique to pass a  fiber tape through  the biceps in a locked fashion adjacent to the biceps anchor.  The biceps tendon was cut at its attachment on the superior labrum.  A hole for a 2.9 mm Arthrex PushLock was drilled in the bicipital groove just superior to the subscapularis tendon insertion.  The fiber tape was loaded onto the push lock anchor and impacted into place into the previously drilled hole in the bicipital groove.  This appropriately secured the biceps into the bicipital groove and took it off of tension.  Next, an oscillating shaver was used to debride the frayed portion of the anterior aspect of the supraspinatus in the area of the partial-thickness tear on the articular side.  A spinal needle was placed in the central portion of the partial thickness rotator cuff tear and an 0-PDS suture was passed through the needle and retrieved out of the anterior portal to mark this region.  Next, the arthroscope was then introduced into the subacromial space. A direct lateral portal was created with an 11-blade after spinal needle localization. An extensive subacromial bursectomy spanning from the [facility] joint to the lateral gutter was performed using a combination of the shaver and Arthrocare wand.   On further examination of the rotator cuff, there was a region medial to the glenoid  where there was a region of bursal sided high-grade partial-thickness tear of the supraspinatus.  The rotator cuff lateral to this region was intact.  A scorpion suture passer was used to pass a side-to-side margin convergence type stitch through this region of tearing.  This was then tied with a knot pusher and cut appropriately.  This appropriately reduced this tear.  Next, the a switching stick was used to probe the bursal side of the rotator cuff in the region of the previously passed 0 PDS suture.  The bursal side of the rotator cuff was intact as the switching stick could not be pushed through into the glenohumeral joint.  This confirmed appropriate  partial-thickness nature of the tear at the musculotendinous junction. Given this, we decided to proceed with Regeneten patch placement. The Regeneten patch delivery gun was placed appropriately and the patch was delivered over the supraspinatus tendon using the PDS suture as a reference. It was positioned such the medial border of the patch was just medial to the PDS suture. Tendon staples were placed medially, anteriorly, and posteriorly after making appropriate stab incisions for portal placement. Two bone staples were then placed laterally. The patch was then probed to confirm appropriate stability.   Arthroscopic fluid was evacuated from the joint.  The portals were closed with 3-0 Nylon. Xeroform was applied to the portals. A sterile dressing was applied, followed by a Polar Care sleeve and a SlingShot shoulder immobilizer/sling. The patient awoke from anesthesia without difficulty and was transferred to the PACU in stable condition.    Of note, assistance from a PA was essential to performing the surgery.  PA was present for the entire surgery.  PA assisted with patient positioning, retraction, instrumentation, and wound closure. The surgery would have been more difficult and had longer operative time without PA assistance.   COMPLICATIONS: none  DISPOSITION: plan for discharge home after recovery in PACU  POSTOPERATIVE PLAN: Remain in sling (except hygiene and elbow/wrist/hand RoM exercises as instructed by PT) x 2 weeks and NWB for this time. Wean from sling as tolerated after 2 weeks.  PT to begin 3-4 days after surgery. Use Regeneten Patch rehab protocol: For the first 4 weeks,  forward flexion is limited to 100. External rotation with the arm by the side is allowed, but abduction-external rotation is not allowed for the first 6 weeks. After 6 weeks, no restrictions on motion or arm use.

## 2020-01-09 NOTE — Anesthesia Procedure Notes (Signed)
Procedure Name: Intubation Performed by: Calven Gilkes R, CRNA Pre-anesthesia Checklist: Patient identified, Emergency Drugs available, Suction available and Patient being monitored Patient Re-evaluated:Patient Re-evaluated prior to induction Oxygen Delivery Method: Circle system utilized Preoxygenation: Pre-oxygenation with 100% oxygen Induction Type: IV induction Ventilation: Mask ventilation without difficulty and Oral airway inserted - appropriate to patient size Laryngoscope Size: Mac and 3 Tube type: Oral Tube size: 7.5 mm Number of attempts: 1 Airway Equipment and Method: Oral airway Placement Confirmation: ETT inserted through vocal cords under direct vision,  positive ETCO2 and breath sounds checked- equal and bilateral Secured at: 22 cm Tube secured with: Tape Dental Injury: Teeth and Oropharynx as per pre-operative assessment        

## 2020-01-09 NOTE — H&P (Signed)
Paper H&P to be scanned into permanent record. H&P reviewed. No significant changes noted.  

## 2020-01-09 NOTE — Discharge Instructions (Addendum)
Post-Op Instructions - Regeneten Patch  1. Bracing: You will wear a shoulder immobilizer or sling for 2 weeks.   2. Driving: No driving for at least 2 weeks post-op.   3. Activity: Passive range of motion only for first 2 weeks. Then progress to motion as tolerated, moving from passive to active-assisted to active motion. For the first 4 weeks, forward flexion is limited to 100. External rotation with the arm by the side is allowed, but abduction-external rotation is not allowed for the first 6 weeks. After 6 weeks, no restrictions on motion or arm use. Return to normal activities normally takes 4-6 months on average. If rehab goes very well, may be able to do most activities at 4 months, except overhead or contact sports.  4. Physical Therapy: Begins 3-4 days after surgery  5. Medications:  - You will be provided a prescription for narcotic pain medicine. After surgery, take 1-2 narcotic tablets every 4 hours if needed for severe pain.  - A prescription for anti-nausea medication will be provided in case the narcotic medicine causes nausea - take 1 tablet every 6 hours only if nauseated.   - Take tylenol 1000 mg (2 Extra Strength tablets or 3 regular strength) every 8 hours for pain.  May decrease or stop tylenol 5 days after surgery if you are having minimal pain. - Resume home Plavix starting the day after surgery. Increase Aspirin dose to 325mg /day x 2 weeks to reduce chance of blood clots (DVT/PE). Then can resume normal 81mg /day dose. - DO NOT take ANY nonsteroidal anti-inflammatory pain medications (Advil, Motrin, Ibuprofen, Aleve, Naproxen, or Naprosyn) with Plavix   If you are taking prescription medication for anxiety, depression, insomnia, muscle spasm, chronic pain, or for attention deficit disorder, you are advised that you are at a higher risk of adverse effects with use of narcotics post-op, including narcotic addiction/dependence, depressed breathing, death. If you use  non-prescribed substances: alcohol, marijuana, cocaine, heroin, methamphetamines, etc., you are at a higher risk of adverse effects with use of narcotics post-op, including narcotic addiction/dependence, depressed breathing, death. You are advised that taking > 50 morphine milligram equivalents (MME) of narcotic pain medication per day results in twice the risk of overdose or death. For your prescription provided: oxycodone 5 mg - taking more than 6 tablets per day would result in > 50 morphine milligram equivalents (MME) of narcotic pain medication. Be advised that we will prescribe narcotics short-term, for acute post-operative pain only - 3 weeks for major operations such as shoulder repair/reconstruction surgeries.     6. Post-Op Appointment:  Your first post-op appointment will be 10-14 days post-op.  7. Work or School: For most, but not all procedures, we advise staying out of work or school for at least 1 to 2 weeks in order to recover from the stress of surgery and to allow time for healing.   If you need a work or school note this can be provided.   8. Smoking: If you are a smoker, you need to refrain from smoking in the postoperative period. The nicotine in cigarettes will inhibit healing of your shoulder repair and decrease the chance of successful repair. Similarly, nicotine containing products (gum, patches) should be avoided.   Post-operative Brace: Apply and remove the brace you received as you were instructed to at the time of fitting and as described in detail as the brace's instructions for use indicate.  Wear the brace for the period of time prescribed by your physician.  The  brace can be cleaned with soap and water and allowed to air dry only.  Should the brace result in increased pain, decreased feeling (numbness/tingling), increased swelling or an overall worsening of your medical condition, please contact your doctor immediately.  If an emergency situation occurs as a result  of wearing the brace after normal business hours, please dial 911 and seek immediate medical attention.  Let your doctor know if you have any further questions about the brace issued to you. Refer to the shoulder sling instructions for use if you have any questions regarding the correct fit of your shoulder sling.  [place] [facility]bles[facility]ooting: [phone]  Video that illustrates how to properly use a shoulder sling: "Instructions for Proper Use of an Orthopaedic Sling" [email or web address]

## 2020-01-10 ENCOUNTER — Encounter: Payer: Self-pay | Admitting: Orthopedic Surgery

## 2020-01-10 ENCOUNTER — Ambulatory Visit: Payer: Medicare Other | Admitting: Physical Therapy

## 2020-01-10 ENCOUNTER — Ambulatory Visit: Payer: Medicare Other | Admitting: Occupational Therapy

## 2020-01-12 ENCOUNTER — Ambulatory Visit: Payer: Medicare Other | Admitting: Physical Therapy

## 2020-01-12 ENCOUNTER — Ambulatory Visit: Payer: Medicare Other | Admitting: Occupational Therapy

## 2020-01-13 ENCOUNTER — Ambulatory Visit: Payer: Medicare Other | Attending: Orthopedic Surgery

## 2020-01-13 ENCOUNTER — Other Ambulatory Visit: Payer: Self-pay

## 2020-01-13 DIAGNOSIS — R278 Other lack of coordination: Secondary | ICD-10-CM | POA: Diagnosis present

## 2020-01-13 DIAGNOSIS — M6281 Muscle weakness (generalized): Secondary | ICD-10-CM | POA: Insufficient documentation

## 2020-01-13 DIAGNOSIS — M25612 Stiffness of left shoulder, not elsewhere classified: Secondary | ICD-10-CM | POA: Insufficient documentation

## 2020-01-13 DIAGNOSIS — I69354 Hemiplegia and hemiparesis following cerebral infarction affecting left non-dominant side: Secondary | ICD-10-CM | POA: Insufficient documentation

## 2020-01-13 DIAGNOSIS — I6309 Cerebral infarction due to thrombosis of other precerebral artery: Secondary | ICD-10-CM | POA: Diagnosis present

## 2020-01-13 NOTE — Therapy (Signed)
[place] MAIN [facility] SERVICES [address] [place], [place], [address] Phone: [phone]   Fax:  [phone]  Physical Therapy Evaluation  Patient Details  Name: Russell Herman MRN: [id number] Date of Birth: [birth year] Referring Provider (PT): Leim Fabry MD (Orthopedics)   Encounter Date: 01/13/2020   PT End of Session - 01/13/20 1210    Visit Number 1    Number of Visits 25    Date for PT Re-Evaluation 04/06/20    Authorization Type Medicare    Authorization Time Period 01/13/20-04/06/20    PT Start Time 0800    PT Stop Time 0910    PT Time Calculation (min) 70 min    Activity Tolerance Patient tolerated treatment well;No increased pain    Behavior During Therapy WFL for tasks assessed/performed           Past Medical History:  Diagnosis Date  . Adenomatous colon polyp   . Allergy   . Arthritis   . Cancer ([place])    basal cell skin  . Carotid stenosis   . Coronary artery disease   . GERD (gastroesophageal reflux disease)   . HLD (hyperlipidemia)   . Hypertension   . Pre-diabetes   . Stroke ([place])   . Wears hearing aid in both ears     Past Surgical History:  Procedure Laterality Date  . cyst removed    . ENDARTERECTOMY Right 08/04/2019   Procedure: ENDARTERECTOMY CAROTID;  Surgeon: Algernon Huxley, MD;  Location: [facility];  Service: Vascular;  Laterality: Right;  . EXCISION OF TONGUE LESION Bilateral 05/05/2019   Procedure: EXCISION OF DORSAL TONGUE LESION;  Surgeon: Margaretha Sheffield, MD;  Location: [place];  Service: ENT;  Laterality: Bilateral;  . SHOULDER ARTHROSCOPY WITH ROTATOR CUFF REPAIR AND SUBACROMIAL DECOMPRESSION Left 01/09/2020   Procedure: Left shoulder arthroscopic  rotator cuff repair with Regeneten patch, subacromial decompression, and biceps tenodesis;  Surgeon: Leim Fabry, MD;  Location: [facility];  Service: Orthopedics;  Laterality: Left;  . skin cancer removed      There were no vitals filed for  this visit.    Subjective Assessment - 01/13/20 0809    Currently in Pain? No/denies              [facility] PT Assessment - 01/13/20 0001      Assessment   Medical Diagnosis Left rotator cuff repair; biceps tenodesis    Referring Provider (PT) Leim Fabry MD   Orthopedics   Onset Date/Surgical Date 01/09/20    Hand Dominance Right    Next MD Visit 01/18/20    Prior Therapy None   PACU direct to home     Precautions   Precautions Shoulder    Type of Shoulder Precautions 2 week sling, except for hygiene and PT   after 2 weeks ween out of sling as tolerated; ROM restrictions, no restrictions after 6 weeks.   Shoulder Interventions Don joy ultra sling;Off for dressing/bathing/exercises    Required Braces or Orthoses Spinal Brace      Balance Screen   Has the patient fallen in the past 6 months No    Has the patient had a decrease in activity level because of a fear of falling?  No    Is the patient reluctant to leave their home because of a fear of falling?  No      Home Ecologist residence    Living Arrangements Spouse/significant other   Son, grandchildren  in camper in driveway while house geing built   Available Help at Discharge Family    Type of Alliance to enter    Entrance Stairs-Number of Steps 3    Entrance Stairs-Rails [place] Two level;Full bath on main level   sleeping on first floor recliner as of now     Prior Function   Level of Independence Independent    Vocation Retired      Observation/Other Assessments   Focus on Therapeutic Outcomes (FOTO)  22      ROM / Strength   AROM / PROM / Strength PROM      PROM   PROM Assessment Site Shoulder    Right/Left Shoulder Right;Left    Left Shoulder Flexion 85 Degrees   protocol permits up to 616 degrees   Left Shoulder ABduction --   not assessed at eval   Left Shoulder Internal Rotation 35 Degrees   assessed in supine, 0 degrees flexion, 0  degrees ABDCT   Left Shoulder External Rotation 31 Degrees   assessed in supine, 0 degrees flexion, 0 degrees ABDCT          INTERVENTION THIS DATE:  -Education on don/doff polar care, sling -AA/ROM Left elbow flexion/extension x20 -P/ROM LUE GHJ ER in neutral flexion/abduction 1x20 -Standing P/ROM Left shoulder flexion hands supported on //bar c retro stepping and trunk flexion 15x10secH (able to get to 85 degrees.  -Wrist circles 20x CW, 20x CCW -Gross left hand grip/extension x10     Objective measurements completed on examination: See above findings.                  PT Education - 01/13/20 1209    Education Details Donning and doffing of polar care cuff and shoulder sling    Person(s) Educated Patient;Spouse    Methods Explanation;Demonstration   teach back   Comprehension Verbalized understanding;Returned demonstration            PT Short Term Goals - 01/13/20 1227      PT SHORT TERM GOAL #1   Title After 2 weeks pt will demontrate improve P/ROM shoulder flexion 100 degrees FF as per protocol.    Baseline 85 degrees    Time 2    Period Weeks    Status New    Target Date 01/27/20      PT SHORT TERM GOAL #2   Title After 2 weeks pt will demonstrate correct performance of HEP for P/ROM of shoulder, and AA/ROM of elbow, wrist, and hand.    Baseline Issued at evlauation    Time 2    Period Weeks    Status New    Target Date 01/27/20             PT Long Term Goals - 01/13/20 1233      PT LONG TERM GOAL #1   Title After 8 weeks pt will demonstrate improved daily function AEB FOTO: >43.    Baseline 22 at evaluation.    Time 8    Period Weeks    Status New    Target Date 03/09/20      PT LONG TERM GOAL #2   Title After 8 weeks pt will demonstrate Left shoulder flexion >135 degrees; MMT flexion 4/5, ABDCT: 5/5, elbow flexion: 5/5.    Baseline not assessed at eval    Time 8    Period Weeks    Status New  Target Date 03/09/20      PT LONG  TERM GOAL #3   Title After 12 weeks pt will demonstrate improve Left shoulder ROM (ER in 90* ABDCT of>80 degrees), flexion >150 degrees; MMT: Flexion: 4+/5, ER: 5/5, IR: 5/5    Baseline not assessed at eval    Time 12    Period Weeks    Status New    Target Date 04/06/20                  Plan - 01/13/20 1211    Clinical Impression Statement Pt evaluated s/p Left rotator cuff repair, debridement, biceps tenodesis. Pt/wife educated on donning/doffing cryocuff and sling, ROM precautions, sling precautions, weight bearing precuations. Pt/caregiver educated on basic HEP for gentle P/ROM or shoulder, elbow, and hand. Entire session maintained within surgeons precautions. Pain is not a limiting factor during this visit. Pt has stiffness with ROM deficits in shoulder, weakness with strength deficits in the LUE. Pt will benefit from skilled PT intervetion to address these impairments in order to restore functional use, tolerance, and independence of LUE in ADL/IADL and leisure actiivty, as well as to improve safety at home and decrease caregiver burden.    Personal Factors and Comorbidities Age;Comorbidity 1    Comorbidities HTN, Hx CVA    Examination-Activity Limitations Stand;Bathing;Bed Mobility;Reach Overhead;Carry;Sleep    Examination-Participation Restrictions Driving;Yard Work;Meal Prep    Stability/Clinical Decision Making Stable/Uncomplicated    Clinical Decision Making Low    Rehab Potential Good    PT Frequency 2x / week    PT Duration 12 weeks    PT Treatment/Interventions Patient/family education;Balance training;Neuromuscular re-education;Therapeutic exercise;Stair training;Moist Heat;Functional mobility training;Therapeutic activities;Dry needling;Passive range of motion    PT Next Visit Plan Review HEP, continue with P/ROM, begin scapular strength training    PT Home Exercise Plan *see note    Consulted and Agree with Plan of Care Patient;Family member/caregiver    Family  Member Consulted wife           Patient will benefit from skilled therapeutic intervention in order to improve the following deficits and impairments:  Decreased activity tolerance,Decreased strength,Difficulty walking,Decreased balance,Decreased coordination,Decreased mobility,Decreased endurance  Visit Diagnosis: Stiffness of left shoulder, not elsewhere classified  Muscle weakness (generalized)     Problem List Patient Active Problem List   Diagnosis Date Noted  . History of stroke with residual effects 11/17/2019  . Right shoulder pain 10/25/2019  . Weakness 10/25/2019  . Decreased GFR 08/15/2019  . Dyslipidemia 08/15/2019  . AKI (acute kidney injury) ([place]) 08/15/2019  . Hemiparesis affecting left side as late effect of stroke ([place]) 08/15/2019  . Carotid stenosis 07/29/2019  . Carotid stenosis, right 07/29/2019  . Carotid artery plaque, right 07/19/2019  . Hx of completed stroke 07/19/2019  . Stroke ([place]) 07/06/2019  . TIA (transient ischemic attack) 07/05/2019  . Hypokalemia 07/05/2019  . GERD without esophagitis 06/18/2017  . Essential hypertension 08/19/2016  . Vertigo 12/05/2015  . Adenomatous colon polyp 01/31/2014  . Basal cell carcinoma 09/23/2013  . Hay fever 09/23/2013  . Vitamin D deficiency 09/23/2013  . Gastric catarrh 11/28/2011  . Hyperlipidemia, mixed 11/28/2011  . Preop cardiovascular exam 11/28/2011   12:50 PM, 01/13/20 Etta Grandchild, PT, DPT Physical Therapist - [place] [phone]    Etta Grandchild 01/13/2020, 12:45 PM  [place] MAIN [facility] SERVICES [address] [place], [place], [address] Phone: [phone]  Fax:  [phone]  Name: ARLESTER KEEHAN MRN: [id number] Date of Birth: [birth year]

## 2020-01-17 ENCOUNTER — Encounter: Payer: Self-pay | Admitting: Orthopedic Surgery

## 2020-01-17 ENCOUNTER — Ambulatory Visit: Payer: Medicare Other | Admitting: Occupational Therapy

## 2020-01-17 ENCOUNTER — Other Ambulatory Visit: Payer: Self-pay

## 2020-01-17 ENCOUNTER — Ambulatory Visit: Payer: Medicare Other

## 2020-01-17 DIAGNOSIS — M6281 Muscle weakness (generalized): Secondary | ICD-10-CM

## 2020-01-17 DIAGNOSIS — M25612 Stiffness of left shoulder, not elsewhere classified: Secondary | ICD-10-CM

## 2020-01-17 NOTE — Therapy (Signed)
[place] MAIN [facility] SERVICES [address] [place], [place], [address] Phone: [phone]   Fax:  [phone]  Physical Therapy Treatment  Patient Details  Name: AJAI HARVILLE MRN: [id number] Date of Birth: [birth year] Referring Provider (PT): Leim Fabry MD (Orthopedics)   Encounter Date: 01/17/2020   PT End of Session - 01/17/20 1534    Visit Number 2    Number of Visits 25    Date for PT Re-Evaluation 04/06/20    Authorization Type Medicare    Authorization Time Period 01/13/20-04/06/20    PT Start Time 1520    PT Stop Time 1600    PT Time Calculation (min) 40 min    Equipment Utilized During Treatment Gait belt    Activity Tolerance Patient tolerated treatment well;No increased pain    Behavior During Therapy WFL for tasks assessed/performed           Past Medical History:  Diagnosis Date  . Adenomatous colon polyp   . Allergy   . Arthritis   . Cancer ([place])    basal cell skin  . Carotid stenosis   . Coronary artery disease   . GERD (gastroesophageal reflux disease)   . HLD (hyperlipidemia)   . Hypertension   . Pre-diabetes   . Stroke ([place])   . Wears hearing aid in both ears     Past Surgical History:  Procedure Laterality Date  . cyst removed    . ENDARTERECTOMY Right 08/04/2019   Procedure: ENDARTERECTOMY CAROTID;  Surgeon: Algernon Huxley, MD;  Location: [facility];  Service: Vascular;  Laterality: Right;  . EXCISION OF TONGUE LESION Bilateral 05/05/2019   Procedure: EXCISION OF DORSAL TONGUE LESION;  Surgeon: Margaretha Sheffield, MD;  Location: [place];  Service: ENT;  Laterality: Bilateral;  . SHOULDER ARTHROSCOPY WITH ROTATOR CUFF REPAIR AND SUBACROMIAL DECOMPRESSION Left 01/09/2020   Procedure: Left shoulder arthroscopic  rotator cuff repair with Regeneten patch, subacromial decompression, and biceps tenodesis;  Surgeon: Leim Fabry, MD;  Location: [facility];  Service: Orthopedics;  Laterality: Left;  . skin cancer  removed      There were no vitals filed for this visit.   Subjective Assessment - 01/17/20 1524    Subjective Pt reports feeling fine today, painb about 3/10, but better than presurgical. Pt/wife endorse fluency with polar care and sling application. Pt has been working on HEP at home, except for ER c SPC in supine, which he is encouraged to work on as well.    Pertinent History Right lateral thalamic infarct mild ataxia and mild hemiparesis. Patient went to rehab 07/07/19-07/15/19. Then he had surgery 7/1 for  s/p successful right CEA    How long can you sit comfortably? not related    How long can you stand comfortably? not related    How long can you walk comfortably? not related    Currently in Pain? Yes    Pain Score 3     Pain Location Shoulder   left shoulder             [facility] PT Assessment - 01/17/20 0001      PROM   Left Shoulder Flexion 94 Degrees    Left Shoulder ABduction 64 Degrees             INTERVENTION THIS DATE: -reviewed last details of sling and polar care, no further education needed -Standing relaxation exercises x 60sec LUE hang in neutral -standing tall to standing pendulum RUE on chair 15x10secH    -  LUE P/ROM walk outs c TRX strap in abduction 10x5secH  -Standing flexion table slides on counter (as per HEP performance) 10x5secH  Education on set up dog leash over door inside for AA/ROM at home  -standing scapular elevation 15x  -standing scapular retraction 15x  -supine Left shoulder external and internal rotation P/ROM, neutral flexion/ABDCT angle 15x -lateral distraction with axillary towel loop 5x20secH     PT Short Term Goals - 01/17/20 1536      PT SHORT TERM GOAL #1   Title After 2 weeks pt will demontrate improve P/ROM shoulder flexion 100 degrees FF as per protocol.    Baseline 85 degrees    Time 2    Period Weeks    Status On-going    Target Date 01/27/20      PT SHORT TERM GOAL #2   Title After 2 weeks pt will demonstrate  correct performance of HEP for P/ROM of shoulder, and AA/ROM of elbow, wrist, and hand.    Baseline Issued at evlauation    Time 2    Period Weeks    Status On-going    Target Date 01/27/20             PT Long Term Goals - 01/17/20 1538      PT LONG TERM GOAL #1   Title After 8 weeks pt will demonstrate improved daily function AEB FOTO: >43.    Baseline 22 at evaluation.    Time 8    Period Weeks    Status On-going    Target Date 03/09/20      PT LONG TERM GOAL #2   Title After 8 weeks pt will demonstrate Left shoulder flexion >135 degrees; MMT flexion 4/5, ABDCT: 5/5, elbow flexion: 5/5.    Baseline not assessed at eval    Time 8    Period Weeks    Status On-going    Target Date 03/09/20      PT LONG TERM GOAL #3   Title After 12 weeks pt will demonstrate improve Left shoulder ROM (ER in 90* ABDCT of>80 degrees), flexion >150 degrees; MMT: Flexion: 4+/5, ER: 5/5, IR: 5/5    Baseline not assessed at eval    Time 12    Period Weeks    Status On-going    Target Date 04/06/20                 Plan - 01/17/20 1535    Clinical Impression Statement Continued with current plan of care as laid out in evaluation. All interventions and precautions maintained as indicated by postoperative protocol per referring provider. Author reviewed HEP to assure quality performance at home, pt asked to include supine ER, also asked to establish a setup to perform ABDCT P/ROM c strap over door. Included scapular retraction and elevation exercise this date, as well as joint mobilization for pain control. Pt remains motivated to advance progress toward goals. Rest breaks provided as needed, pt quick to ask when needed. Author maintains all interventions within appropriate level of intensity as not to purposefully exacerbate pain. Pt does require varying levels of assistance and cuing for completion of exercises for correct form and sometimes due to pain/weakness. ROM remains limited, although  gradual improvement is apparent. Pt continues to demonstrate progress toward goals AEB progression of some interventions this date either in volume or intensity. No updates to HEP this date.     Personal Factors and Comorbidities Age;Comorbidity 1    Comorbidities HTN, Hx CVA  Examination-Activity Limitations Stand;Bathing;Bed Mobility;Reach Overhead;Carry;Sleep    Examination-Participation Restrictions Driving;Yard Work;Meal Prep    Stability/Clinical Decision Making Stable/Uncomplicated    Clinical Decision Making Low    Rehab Potential Good    PT Frequency 2x / week    PT Duration 12 weeks    PT Treatment/Interventions Patient/family education;Balance training;Neuromuscular re-education;Therapeutic exercise;Stair training;Moist Heat;Functional mobility training;Therapeutic activities;Dry needling;Passive range of motion    PT Next Visit Plan Review HEP, continue with P/ROM, begin scapular strength training    PT Home Exercise Plan *see note    Consulted and Agree with Plan of Care Patient;Family member/caregiver    Family Member Consulted wife           Patient will benefit from skilled therapeutic intervention in order to improve the following deficits and impairments:  Decreased activity tolerance,Decreased strength,Difficulty walking,Decreased balance,Decreased coordination,Decreased mobility,Decreased endurance  Visit Diagnosis: Stiffness of left shoulder, not elsewhere classified  Muscle weakness (generalized)     Problem List Patient Active Problem List   Diagnosis Date Noted  . History of stroke with residual effects 11/17/2019  . Right shoulder pain 10/25/2019  . Weakness 10/25/2019  . Decreased GFR 08/15/2019  . Dyslipidemia 08/15/2019  . AKI (acute kidney injury) ([place]) 08/15/2019  . Hemiparesis affecting left side as late effect of stroke ([place]) 08/15/2019  . Carotid stenosis 07/29/2019  . Carotid stenosis, right 07/29/2019  . Carotid artery plaque, right  07/19/2019  . Hx of completed stroke 07/19/2019  . Stroke ([place]) 07/06/2019  . TIA (transient ischemic attack) 07/05/2019  . Hypokalemia 07/05/2019  . GERD without esophagitis 06/18/2017  . Essential hypertension 08/19/2016  . Vertigo 12/05/2015  . Adenomatous colon polyp 01/31/2014  . Basal cell carcinoma 09/23/2013  . Hay fever 09/23/2013  . Vitamin D deficiency 09/23/2013  . Gastric catarrh 11/28/2011  . Hyperlipidemia, mixed 11/28/2011  . Preop cardiovascular exam 11/28/2011   4:13 PM, 01/17/20 Etta Grandchild, PT, DPT Physical Therapist - [place] [facility] 01/17/2020, 4:10 PM  [place] MAIN [facility] SERVICES [address] [place], [place], [address] Phone: [phone]   Fax:  [phone]  Name: AZAIAH LICCIARDI MRN: [id number] Date of Birth: 1945/11/24

## 2020-01-19 ENCOUNTER — Ambulatory Visit: Payer: Medicare Other | Admitting: Occupational Therapy

## 2020-01-19 ENCOUNTER — Ambulatory Visit: Payer: Medicare Other | Admitting: Physical Therapy

## 2020-01-20 ENCOUNTER — Other Ambulatory Visit: Payer: Self-pay

## 2020-01-20 ENCOUNTER — Ambulatory Visit: Payer: Medicare Other

## 2020-01-20 DIAGNOSIS — M6281 Muscle weakness (generalized): Secondary | ICD-10-CM

## 2020-01-20 DIAGNOSIS — M25612 Stiffness of left shoulder, not elsewhere classified: Secondary | ICD-10-CM | POA: Diagnosis not present

## 2020-01-20 NOTE — Therapy (Signed)
[place] MAIN [facility] SERVICES [address] [place], [place], [address] Phone: [phone]   Fax:  [phone]  Physical Therapy Treatment  Patient Details  Name: Russell Herman MRN: [id number] Date of Birth: [birth year] Referring Provider (PT): Leim Fabry MD (Orthopedics)   Encounter Date: 01/20/2020   PT End of Session - 01/20/20 1137    Visit Number 3    Number of Visits 25    Date for PT Re-Evaluation 04/06/20    Authorization Type Medicare    Authorization Time Period 01/13/20-04/06/20    PT Start Time 1030    PT Stop Time 1116    PT Time Calculation (min) 46 min    Equipment Utilized During Treatment Gait belt    Activity Tolerance Patient tolerated treatment well;No increased pain    Behavior During Therapy WFL for tasks assessed/performed           Past Medical History:  Diagnosis Date  . Adenomatous colon polyp   . Allergy   . Arthritis   . Cancer ([place])    basal cell skin  . Carotid stenosis   . Coronary artery disease   . GERD (gastroesophageal reflux disease)   . HLD (hyperlipidemia)   . Hypertension   . Pre-diabetes   . Stroke ([place])   . Wears hearing aid in both ears     Past Surgical History:  Procedure Laterality Date  . cyst removed    . ENDARTERECTOMY Right 08/04/2019   Procedure: ENDARTERECTOMY CAROTID;  Surgeon: Algernon Huxley, MD;  Location: [facility];  Service: Vascular;  Laterality: Right;  . EXCISION OF TONGUE LESION Bilateral 05/05/2019   Procedure: EXCISION OF DORSAL TONGUE LESION;  Surgeon: Margaretha Sheffield, MD;  Location: [place];  Service: ENT;  Laterality: Bilateral;  . SHOULDER ARTHROSCOPY WITH ROTATOR CUFF REPAIR AND SUBACROMIAL DECOMPRESSION Left 01/09/2020   Procedure: Left shoulder arthroscopic  rotator cuff repair with Regeneten patch, subacromial decompression, and biceps tenodesis;  Surgeon: Leim Fabry, MD;  Location: [facility];  Service: Orthopedics;  Laterality: Left;  . skin cancer  removed      There were no vitals filed for this visit.   Subjective Assessment - 01/20/20 1136    Subjective Patient reports he has been allowed by surgeon to wean from sling. Is not wearing it to PT visit. Has been compliant with HEP.    Pertinent History Right lateral thalamic infarct mild ataxia and mild hemiparesis. Patient went to rehab 07/07/19-07/15/19. Then he had surgery 7/1 for  s/p successful right CEA    How long can you sit comfortably? not related    How long can you stand comfortably? not related    How long can you walk comfortably? not related    Currently in Pain? Yes    Pain Score 3     Pain Location Shoulder    Pain Orientation Left    Pain Descriptors / Indicators Aching    Pain Type Surgical pain    Pain Onset More than a month ago    Pain Frequency Constant              Treatment:  Manual:  Supine:  Passive L shoulder  ROM with multipoint holds for tension reduction: Flexion 15x 20 second holds, abduction 10x 15 second holds, ER with elbow against side due to protocol 10x 10 second holds Passive L elbow ROM with elbow propped on pillow: flexion/extension 12x 10 second holds  Seated: Scapular mobilizations: protraction/retraction 8x,  depression/elevation 10x   Therex: cues for guided motion of AAROM introduction: Supine: -PVC pipe flexion to 90 degrees with RUE leading LUE 10x 10 second holds, guidance for AAROM  Seated:  -scapular retraction and depression pinching PT finger between shoulder blades 10x -scapular circles 10x clockwise, 10x counterclockwise  -UE ranger flexion with RUE on LUE for AAROM 12x 10 second holds  -UE on table:   Wrist flexion/extension 10x with increasing resistance  Wrist Supination/pronation 10x with cues for decreasing velocity of movement   Forward flexion AAROM with towel (added to HEP) 10x 10 second holds  Ice applied at end of session    Per physician note: He can begin to wean from the sling. Progress to motion as  tolerated, moving from passive to active-assisted to active motion. For the first 4 weeks, forward flexion is limited to 100. External rotation with the arm by the side is allowed, but abduction-external rotation is not allowed for the first 6 weeks.                       PT Education - 01/20/20 1137    Education Details exercise technique, body mechanics, precautions    Person(s) Educated Patient;Spouse    Methods Explanation;Demonstration;Tactile cues;Verbal cues;Handout    Comprehension Verbalized understanding;Returned demonstration;Verbal cues required;Tactile cues required            PT Short Term Goals - 01/17/20 1536      PT SHORT TERM GOAL #1   Title After 2 weeks pt will demontrate improve P/ROM shoulder flexion 100 degrees FF as per protocol.    Baseline 85 degrees    Time 2    Period Weeks    Status On-going    Target Date 01/27/20      PT SHORT TERM GOAL #2   Title After 2 weeks pt will demonstrate correct performance of HEP for P/ROM of shoulder, and AA/ROM of elbow, wrist, and hand.    Baseline Issued at evlauation    Time 2    Period Weeks    Status On-going    Target Date 01/27/20             PT Long Term Goals - 01/17/20 1538      PT LONG TERM GOAL #1   Title After 8 weeks pt will demonstrate improved daily function AEB FOTO: >43.    Baseline 22 at evaluation.    Time 8    Period Weeks    Status On-going    Target Date 03/09/20      PT LONG TERM GOAL #2   Title After 8 weeks pt will demonstrate Left shoulder flexion >135 degrees; MMT flexion 4/5, ABDCT: 5/5, elbow flexion: 5/5.    Baseline not assessed at eval    Time 8    Period Weeks    Status On-going    Target Date 03/09/20      PT LONG TERM GOAL #3   Title After 12 weeks pt will demonstrate improve Left shoulder ROM (ER in 90* ABDCT of>80 degrees), flexion >150 degrees; MMT: Flexion: 4+/5, ER: 5/5, IR: 5/5    Baseline not assessed at eval    Time 12    Period Weeks     Status On-going    Target Date 04/06/20                 Plan - 01/20/20 1141    Clinical Impression Statement Patient introduced to [facility] of L shoulder  this session with various planes of activity. Patient is challenged initially but is able to demonstrate understanding with repeated interventions. Scapular mobilization is limited with patient preferring an elevated protracted position. Patient and patient's wife educated on protocol per physician last note however this therapist is still awaiting copy of protocol. Pt will benefit from skilled PT intervetion to address these impairments in order to restore functional use, tolerance, and independence of LUE in ADL/IADL and leisure actiivty, as well as to improve safety at home and decrease caregiver burden.    Personal Factors and Comorbidities Age;Comorbidity 1    Comorbidities HTN, Hx CVA    Examination-Activity Limitations Stand;Bathing;Bed Mobility;Reach Overhead;Carry;Sleep    Examination-Participation Restrictions Driving;Yard Work;Meal Prep    Stability/Clinical Decision Making Stable/Uncomplicated    Rehab Potential Good    PT Frequency 2x / week    PT Duration 12 weeks    PT Treatment/Interventions Patient/family education;Balance training;Neuromuscular re-education;Therapeutic exercise;Stair training;Moist Heat;Functional mobility training;Therapeutic activities;Dry needling;Passive range of motion    PT Next Visit Plan Review HEP, continue with P/ROM, begin scapular strength training    PT Home Exercise Plan *see note    Consulted and Agree with Plan of Care Patient;Family member/caregiver    Family Member Consulted wife           Patient will benefit from skilled therapeutic intervention in order to improve the following deficits and impairments:  Decreased activity tolerance,Decreased strength,Difficulty walking,Decreased balance,Decreased coordination,Decreased mobility,Decreased endurance  Visit  Diagnosis: Stiffness of left shoulder, not elsewhere classified  Muscle weakness (generalized)     Problem List Patient Active Problem List   Diagnosis Date Noted  . History of stroke with residual effects 11/17/2019  . Right shoulder pain 10/25/2019  . Weakness 10/25/2019  . Decreased GFR 08/15/2019  . Dyslipidemia 08/15/2019  . AKI (acute kidney injury) ([place]) 08/15/2019  . Hemiparesis affecting left side as late effect of stroke ([place]) 08/15/2019  . Carotid stenosis 07/29/2019  . Carotid stenosis, right 07/29/2019  . Carotid artery plaque, right 07/19/2019  . Hx of completed stroke 07/19/2019  . Stroke ([place]) 07/06/2019  . TIA (transient ischemic attack) 07/05/2019  . Hypokalemia 07/05/2019  . GERD without esophagitis 06/18/2017  . Essential hypertension 08/19/2016  . Vertigo 12/05/2015  . Adenomatous colon polyp 01/31/2014  . Basal cell carcinoma 09/23/2013  . Hay fever 09/23/2013  . Vitamin D deficiency 09/23/2013  . Gastric catarrh 11/28/2011  . Hyperlipidemia, mixed 11/28/2011  . Preop cardiovascular exam 11/28/2011   Janna Arch, PT, DPT   01/20/2020, 11:42 AM  [place] MAIN [facility] SERVICES [address] [place], [place], [address] Phone: [phone]   Fax:  [phone]  Name: Russell Herman MRN: [id number] Date of Birth: [birth year]

## 2020-01-23 ENCOUNTER — Ambulatory Visit: Payer: Medicare Other

## 2020-01-24 ENCOUNTER — Ambulatory Visit: Payer: Medicare Other | Admitting: Occupational Therapy

## 2020-01-24 ENCOUNTER — Ambulatory Visit: Payer: Medicare Other | Admitting: Physical Therapy

## 2020-01-26 ENCOUNTER — Ambulatory Visit: Payer: Medicare Other | Admitting: Physical Therapy

## 2020-01-26 ENCOUNTER — Other Ambulatory Visit: Payer: Self-pay

## 2020-01-26 ENCOUNTER — Ambulatory Visit: Payer: Medicare Other

## 2020-01-26 ENCOUNTER — Ambulatory Visit: Payer: Medicare Other | Admitting: Occupational Therapy

## 2020-01-26 DIAGNOSIS — M6281 Muscle weakness (generalized): Secondary | ICD-10-CM

## 2020-01-26 DIAGNOSIS — R278 Other lack of coordination: Secondary | ICD-10-CM

## 2020-01-26 DIAGNOSIS — M25612 Stiffness of left shoulder, not elsewhere classified: Secondary | ICD-10-CM

## 2020-01-26 NOTE — Therapy (Signed)
[place] MAIN [facility] SERVICES [address] [place], [place], [address] Phone: [phone]   Fax:  [phone]  Physical Therapy Treatment  Patient Details  Name: Russell Herman MRN: [id number] Date of Birth: May 12, 1945 Referring Provider (PT): Leim Fabry MD (Orthopedics)   Encounter Date: 01/26/2020   PT End of Session - 01/26/20 1541    Visit Number 4    Number of Visits 25    Date for PT Re-Evaluation 04/06/20    Authorization Type Medicare    Authorization Time Period 01/13/20-04/06/20    PT Start Time 1100    PT Stop Time 1146    PT Time Calculation (min) 46 min    Equipment Utilized During Treatment Gait belt    Activity Tolerance Patient tolerated treatment well;No increased pain    Behavior During Therapy WFL for tasks assessed/performed           Past Medical History:  Diagnosis Date  . Adenomatous colon polyp   . Allergy   . Arthritis   . Cancer ([place])    basal cell skin  . Carotid stenosis   . Coronary artery disease   . GERD (gastroesophageal reflux disease)   . HLD (hyperlipidemia)   . Hypertension   . Pre-diabetes   . Stroke ([place])   . Wears hearing aid in both ears     Past Surgical History:  Procedure Laterality Date  . cyst removed    . ENDARTERECTOMY Right 08/04/2019   Procedure: ENDARTERECTOMY CAROTID;  Surgeon: Algernon Huxley, MD;  Location: [facility];  Service: Vascular;  Laterality: Right;  . EXCISION OF TONGUE LESION Bilateral 05/05/2019   Procedure: EXCISION OF DORSAL TONGUE LESION;  Surgeon: Margaretha Sheffield, MD;  Location: [place];  Service: ENT;  Laterality: Bilateral;  . SHOULDER ARTHROSCOPY WITH ROTATOR CUFF REPAIR AND SUBACROMIAL DECOMPRESSION Left 01/09/2020   Procedure: Left shoulder arthroscopic  rotator cuff repair with Regeneten patch, subacromial decompression, and biceps tenodesis;  Surgeon: Leim Fabry, MD;  Location: [facility];  Service: Orthopedics;  Laterality: Left;  . skin cancer  removed      There were no vitals filed for this visit.   Subjective Assessment - 01/26/20 1540    Subjective Patient reports no falls or LOB since last session. Has been compliant with his HEP since seen last.    Pertinent History Right lateral thalamic infarct mild ataxia and mild hemiparesis. Patient went to rehab 07/07/19-07/15/19. Then he had surgery 7/1 for  s/p successful right CEA    How long can you sit comfortably? not related    How long can you stand comfortably? not related    How long can you walk comfortably? not related    Currently in Pain? Yes    Pain Score 2     Pain Location Shoulder    Pain Orientation Left    Pain Descriptors / Indicators Aching    Pain Type Surgical pain    Pain Onset More than a month ago    Pain Frequency Constant               Treatment:  Manual:  Supine:  Passive L shoulder  ROM with multipoint holds for tension reduction: Flexion 15x 20 second holds, abduction 10x 20 second holds, ER with elbow against side due to protocol 10x 20 second holds Passive L elbow ROM with elbow propped on pillow: flexion/extension 12x 20 second holds  tricep isometric 10x 5 second hold of 40-50% contraction  Seated: Scapular mobilizations: protraction/retraction 8x, depression/elevation 10x   Therex: cues for guided motion of AAROM introduction: Supine: -PVC pipe flexion to 90 degrees with RUE leading LUE 10x 10 second holds, guidance for AAROM; terminated due to pain  Seated:  -scapular retraction and depression pinching PT finger between shoulder blades 10x -scapular circles 10x clockwise, 10x counterclockwise  -UE ranger:  flexion with RUE on LUE for AAROM 12x 10 second holds, abd/add to neutral 12x 10 second holds, clockwise circles 10x, counterclockwise 10x   L hand grip strengthening squeezing putty (added to HEP-putty given)  Education on straightening arm when standing to reduce shoulder elevation and protraction   Ice applied at end of  session    Per physician note: He can begin to wean from the sling. Progress to motion as tolerated, moving from passive to active-assisted to active motion. For the first 4 weeks, forward flexion is limited to 100. External rotation with the arm by the side is allowed, but abduction-external rotation is not allowed for the first 6 weeks.                        PT Education - 01/26/20 1540    Education Details AAROM, PROM, body mechanics, precautions    Person(s) Educated Patient    Methods Explanation;Demonstration;Tactile cues;Verbal cues    Comprehension Verbalized understanding;Returned demonstration;Verbal cues required;Tactile cues required            PT Short Term Goals - 01/17/20 1536      PT SHORT TERM GOAL #1   Title After 2 weeks pt will demontrate improve P/ROM shoulder flexion 100 degrees FF as per protocol.    Baseline 85 degrees    Time 2    Period Weeks    Status On-going    Target Date 01/27/20      PT SHORT TERM GOAL #2   Title After 2 weeks pt will demonstrate correct performance of HEP for P/ROM of shoulder, and AA/ROM of elbow, wrist, and hand.    Baseline Issued at evlauation    Time 2    Period Weeks    Status On-going    Target Date 01/27/20             PT Long Term Goals - 01/17/20 1538      PT LONG TERM GOAL #1   Title After 8 weeks pt will demonstrate improved daily function AEB FOTO: >43.    Baseline 22 at evaluation.    Time 8    Period Weeks    Status On-going    Target Date 03/09/20      PT LONG TERM GOAL #2   Title After 8 weeks pt will demonstrate Left shoulder flexion >135 degrees; MMT flexion 4/5, ABDCT: 5/5, elbow flexion: 5/5.    Baseline not assessed at eval    Time 8    Period Weeks    Status On-going    Target Date 03/09/20      PT LONG TERM GOAL #3   Title After 12 weeks pt will demonstrate improve Left shoulder ROM (ER in 90* ABDCT of>80 degrees), flexion >150 degrees; MMT: Flexion: 4+/5, ER:  5/5, IR: 5/5    Baseline not assessed at eval    Time 12    Period Weeks    Status On-going    Target Date 04/06/20                 Plan - 01/26/20 1546  Clinical Impression Statement Patient continues to progress with functional range of motion with decreased resistance to 100 degrees with PROM noted. Patient is unable to perform supine AAROM this session however did excellent with seated AAROM on UE ranger. Education on straightening arm when standing to reduce shoulder elevation and protraction received well with patient demonstrating understanding. Pt will benefit from skilled PT intervetion to address these impairments in order to restore functional use, tolerance, and independence of LUE in ADL/IADL and leisure actiivty, as well as to improve safety at home and decrease caregiver burden.    Personal Factors and Comorbidities Age;Comorbidity 1    Comorbidities HTN, Hx CVA    Examination-Activity Limitations Stand;Bathing;Bed Mobility;Reach Overhead;Carry;Sleep    Examination-Participation Restrictions Driving;Yard Work;Meal Prep    Stability/Clinical Decision Making Stable/Uncomplicated    Rehab Potential Good    PT Frequency 2x / week    PT Duration 12 weeks    PT Treatment/Interventions Patient/family education;Balance training;Neuromuscular re-education;Therapeutic exercise;Stair training;Moist Heat;Functional mobility training;Therapeutic activities;Dry needling;Passive range of motion    PT Next Visit Plan Review HEP, continue with P/ROM, begin scapular strength training    PT Home Exercise Plan *see note    Consulted and Agree with Plan of Care Patient;Family member/caregiver    Family Member Consulted wife           Patient will benefit from skilled therapeutic intervention in order to improve the following deficits and impairments:  Decreased activity tolerance,Decreased strength,Difficulty walking,Decreased balance,Decreased coordination,Decreased  mobility,Decreased endurance  Visit Diagnosis: Stiffness of left shoulder, not elsewhere classified  Muscle weakness (generalized)  Other lack of coordination     Problem List Patient Active Problem List   Diagnosis Date Noted  . History of stroke with residual effects 11/17/2019  . Right shoulder pain 10/25/2019  . Weakness 10/25/2019  . Decreased GFR 08/15/2019  . Dyslipidemia 08/15/2019  . AKI (acute kidney injury) ([place]) 08/15/2019  . Hemiparesis affecting left side as late effect of stroke ([place]) 08/15/2019  . Carotid stenosis 07/29/2019  . Carotid stenosis, right 07/29/2019  . Carotid artery plaque, right 07/19/2019  . Hx of completed stroke 07/19/2019  . Stroke ([place]) 07/06/2019  . TIA (transient ischemic attack) 07/05/2019  . Hypokalemia 07/05/2019  . GERD without esophagitis 06/18/2017  . Essential hypertension 08/19/2016  . Vertigo 12/05/2015  . Adenomatous colon polyp 01/31/2014  . Basal cell carcinoma 09/23/2013  . Hay fever 09/23/2013  . Vitamin D deficiency 09/23/2013  . Gastric catarrh 11/28/2011  . Hyperlipidemia, mixed 11/28/2011  . Preop cardiovascular exam 11/28/2011   Janna Arch, PT, DPT   01/26/2020, 3:47 PM  [place] MAIN [facility] SERVICES [address] [place], [place], [address] Phone: [phone]   Fax:  [phone]  Name: Russell Herman MRN: [id number] Date of Birth: 01/21/1946

## 2020-01-31 ENCOUNTER — Ambulatory Visit: Payer: Medicare Other

## 2020-01-31 ENCOUNTER — Other Ambulatory Visit: Payer: Self-pay

## 2020-01-31 ENCOUNTER — Ambulatory Visit: Payer: Medicare Other | Admitting: Occupational Therapy

## 2020-01-31 ENCOUNTER — Ambulatory Visit: Payer: Medicare Other | Admitting: Physical Therapy

## 2020-01-31 DIAGNOSIS — R278 Other lack of coordination: Secondary | ICD-10-CM

## 2020-01-31 DIAGNOSIS — I69354 Hemiplegia and hemiparesis following cerebral infarction affecting left non-dominant side: Secondary | ICD-10-CM

## 2020-01-31 DIAGNOSIS — I6309 Cerebral infarction due to thrombosis of other precerebral artery: Secondary | ICD-10-CM

## 2020-01-31 DIAGNOSIS — M6281 Muscle weakness (generalized): Secondary | ICD-10-CM

## 2020-01-31 DIAGNOSIS — M25612 Stiffness of left shoulder, not elsewhere classified: Secondary | ICD-10-CM

## 2020-01-31 NOTE — Therapy (Signed)
[place] MAIN [facility] SERVICES [address] [place], [place], [address] Phone: [phone]   Fax:  [phone]  Physical Therapy Treatment  Patient Details  Name: Russell Herman MRN: [id number] Date of Birth: 1945-06-29 Referring Provider (PT): Leim Fabry MD (Orthopedics)   Encounter Date: 01/31/2020   PT End of Session - 01/31/20 1618    Visit Number 5    Number of Visits 25    Date for PT Re-Evaluation 04/06/20    Authorization Type Medicare    Authorization Time Period 01/13/20-04/06/20    PT Start Time 0315    PT Stop Time 0405    PT Time Calculation (min) 50 min    Equipment Utilized During Treatment Gait belt    Activity Tolerance Patient tolerated treatment well;No increased pain    Behavior During Therapy WFL for tasks assessed/performed           Past Medical History:  Diagnosis Date   Adenomatous colon polyp    Allergy    Arthritis    Cancer ([place])    basal cell skin   Carotid stenosis    Coronary artery disease    GERD (gastroesophageal reflux disease)    HLD (hyperlipidemia)    Hypertension    Pre-diabetes    Stroke [facility])    Wears hearing aid in both ears     Past Surgical History:  Procedure Laterality Date   cyst removed     ENDARTERECTOMY Right 08/04/2019   Procedure: ENDARTERECTOMY CAROTID;  Surgeon: Algernon Huxley, MD;  Location: [facility];  Service: Vascular;  Laterality: Right;   EXCISION OF TONGUE LESION Bilateral 05/05/2019   Procedure: EXCISION OF DORSAL TONGUE LESION;  Surgeon: Margaretha Sheffield, MD;  Location: [place];  Service: ENT;  Laterality: Bilateral;   SHOULDER ARTHROSCOPY WITH ROTATOR CUFF REPAIR AND SUBACROMIAL DECOMPRESSION Left 01/09/2020   Procedure: Left shoulder arthroscopic  rotator cuff repair with Regeneten patch, subacromial decompression, and biceps tenodesis;  Surgeon: Leim Fabry, MD;  Location: [facility] ORS;  Service: Orthopedics;  Laterality: Left;   skin cancer  removed      There were no vitals filed for this visit.   Subjective Assessment - 01/31/20 1611    Subjective Patient reports his shoulder is painful but it has been.  The patient reports unable to attend PT again until next week as a family memeber is ill and he will be OOT.    Pertinent History Right lateral thalamic infarct mild ataxia and mild hemiparesis. Patient went to rehab 07/07/19-07/15/19. Then he had surgery 7/1 for  s/p successful right CEA    How long can you sit comfortably? not related    How long can you stand comfortably? not related    How long can you walk comfortably? not related    Currently in Pain? Yes    Pain Score 3     Pain Orientation Left    Pain Descriptors / Indicators Aching    Pain Type Surgical pain    Pain Onset More than a month ago                PROM left shoulder flexion, abduction, scaption and ER at neutral firm end feel in all directions with pain reports Joint mobilization glenohumeral joint posterior and inferior glides grade II/II, gentle GH joint distraction  PVC pipe supine OH flexion 5"x10   Seated: Shoulder circles fwd/bwd x20 each Scapular retractions 5"x10 UT stretch with caudal glide 30"x3 for left side  Table slides flexion and scaption 5"x10 each  UBE 3 mins each fwd/bwd Standing PVC pipe shoulder extension 5"x10      Per physician note: He can begin to wean from the sling. Progress to motion as tolerated, moving from passive to active-assisted to active motion. For the first 4 weeks, forward flexion is limited to 100. External rotation with the arm by the side is allowed, but abduction-external rotation is not allowed for the first 6 weeks.   PT Education - 01/31/20 1613    Education Details HEP, precautions    Person(s) Educated Patient    Methods Explanation    Comprehension Verbalized understanding            PT Short Term Goals - 01/17/20 1536      PT SHORT TERM GOAL #1   Title After 2 weeks pt will  demontrate improve P/ROM shoulder flexion 100 degrees FF as per protocol.    Baseline 85 degrees    Time 2    Period Weeks    Status On-going    Target Date 01/27/20      PT SHORT TERM GOAL #2   Title After 2 weeks pt will demonstrate correct performance of HEP for P/ROM of shoulder, and AA/ROM of elbow, wrist, and hand.    Baseline Issued at evlauation    Time 2    Period Weeks    Status On-going    Target Date 01/27/20             PT Long Term Goals - 01/17/20 1538      PT LONG TERM GOAL #1   Title After 8 weeks pt will demonstrate improved daily function AEB FOTO: >43.    Baseline 22 at evaluation.    Time 8    Period Weeks    Status On-going    Target Date 03/09/20      PT LONG TERM GOAL #2   Title After 8 weeks pt will demonstrate Left shoulder flexion >135 degrees; MMT flexion 4/5, ABDCT: 5/5, elbow flexion: 5/5.    Baseline not assessed at eval    Time 8    Period Weeks    Status On-going    Target Date 03/09/20      PT LONG TERM GOAL #3   Title After 12 weeks pt will demonstrate improve Left shoulder ROM (ER in 90* ABDCT of>80 degrees), flexion >150 degrees; MMT: Flexion: 4+/5, ER: 5/5, IR: 5/5    Baseline not assessed at eval    Time 12    Period Weeks    Status On-going    Target Date 04/06/20                 Plan - 01/31/20 1619    Clinical Impression Statement The patient continues with tightness less shoulder with mobility exercises and manual therapy.  The patient reported pain with session however no more than his baseline amount.  The patient presents with poor scapulohumeral rhythm noted during active assisted exercises.  Patient continues to benefit from additional skilled PT services to improve passive and AAROM of left shoulder.    Personal Factors and Comorbidities Age;Comorbidity 1    Comorbidities HTN, Hx CVA    Examination-Activity Limitations Stand;Bathing;Bed Mobility;Reach Overhead;Carry;Sleep    Examination-Participation  Restrictions Driving;Yard Work;Meal Prep    Stability/Clinical Decision Making Stable/Uncomplicated    Rehab Potential Good    PT Frequency 2x / week    PT Duration 12 weeks    PT Treatment/Interventions Patient/family  education;Balance training;Neuromuscular re-education;Therapeutic exercise;Stair training;Moist Heat;Functional mobility training;Therapeutic activities;Dry needling;Passive range of motion    PT Next Visit Plan Review HEP, continue with P/ROM, begin scapular strength training    PT Home Exercise Plan *see note    Consulted and Agree with Plan of Care Patient;Family member/caregiver    Family Member Consulted wife           Patient will benefit from skilled therapeutic intervention in order to improve the following deficits and impairments:  Decreased activity tolerance,Decreased strength,Difficulty walking,Decreased balance,Decreased coordination,Decreased mobility,Decreased endurance  Visit Diagnosis: Stiffness of left shoulder, not elsewhere classified  Hemiparesis affecting left side as late effect of stroke ([place])  Muscle weakness (generalized)  Other lack of coordination  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 [facility])     Problem List Patient Active Problem List   Diagnosis Date Noted   History of stroke with residual effects 11/17/2019   Right shoulder pain 10/25/2019   Weakness 10/25/2019   Decreased GFR 08/15/2019   Dyslipidemia 08/15/2019   AKI (acute kidney injury) ([place]) 08/15/2019   Hemiparesis affecting left side as late effect of stroke ([place]) 08/15/2019   Carotid stenosis 07/29/2019   Carotid stenosis, right 07/29/2019   Carotid artery plaque, right 07/19/2019   Hx of completed stroke 07/19/2019   Stroke ([place]) 07/06/2019   TIA (transient ischemic attack) 07/05/2019   Hypokalemia 07/05/2019   GERD without esophagitis 06/18/2017   Essential hypertension 08/19/2016   Vertigo 12/05/2015    Adenomatous colon polyp 01/31/2014   Basal cell carcinoma 09/23/2013   Hay fever 09/23/2013   Vitamin D deficiency 09/23/2013   Gastric catarrh 11/28/2011   Hyperlipidemia, mixed 11/28/2011   Preop cardiovascular exam 11/28/2011    Hal Morales PT, DPT 01/31/2020, 4:26 PM   [facility] MAIN [facility] SERVICES [address] [place], [place], [address] Phone: [phone]   Fax:  [phone]  Name: Russell Herman MRN: [id number] Date of Birth: 1945-12-06

## 2020-02-02 ENCOUNTER — Ambulatory Visit: Payer: Medicare Other | Admitting: Physical Therapy

## 2020-02-02 ENCOUNTER — Encounter: Payer: Medicare Other | Admitting: Occupational Therapy

## 2020-02-06 ENCOUNTER — Ambulatory Visit: Payer: Medicare Other

## 2020-02-08 ENCOUNTER — Other Ambulatory Visit: Payer: Self-pay

## 2020-02-08 ENCOUNTER — Ambulatory Visit: Payer: Medicare Other | Attending: Orthopedic Surgery

## 2020-02-08 DIAGNOSIS — M6281 Muscle weakness (generalized): Secondary | ICD-10-CM | POA: Diagnosis present

## 2020-02-08 DIAGNOSIS — R278 Other lack of coordination: Secondary | ICD-10-CM | POA: Insufficient documentation

## 2020-02-08 DIAGNOSIS — M25612 Stiffness of left shoulder, not elsewhere classified: Secondary | ICD-10-CM | POA: Diagnosis present

## 2020-02-08 DIAGNOSIS — I69354 Hemiplegia and hemiparesis following cerebral infarction affecting left non-dominant side: Secondary | ICD-10-CM | POA: Insufficient documentation

## 2020-02-08 NOTE — Therapy (Signed)
[place] MAIN [facility] SERVICES [address] [place], [place], [address] Phone: [phone]   Fax:  [phone]  Physical Therapy Treatment  Patient Details  Name: Russell Herman MRN: [id number] Date of Birth: 01-Jul-1945 Referring Provider (PT): Leim Fabry MD (Orthopedics)   Encounter Date: 02/08/2020   PT End of Session - 02/08/20 1222    Visit Number 6    Number of Visits 25    Date for PT Re-Evaluation 04/06/20    Authorization Type Medicare    Authorization Time Period 01/13/20-04/06/20    PT Start Time 1100    PT Stop Time 1143    PT Time Calculation (min) 43 min    Equipment Utilized During Treatment Gait belt    Activity Tolerance Patient tolerated treatment well;No increased pain    Behavior During Therapy WFL for tasks assessed/performed           Past Medical History:  Diagnosis Date  . Adenomatous colon polyp   . Allergy   . Arthritis   . Cancer ([place])    basal cell skin  . Carotid stenosis   . Coronary artery disease   . GERD (gastroesophageal reflux disease)   . HLD (hyperlipidemia)   . Hypertension   . Pre-diabetes   . Stroke ([place])   . Wears hearing aid in both ears     Past Surgical History:  Procedure Laterality Date  . cyst removed    . ENDARTERECTOMY Right 08/04/2019   Procedure: ENDARTERECTOMY CAROTID;  Surgeon: Algernon Huxley, MD;  Location: [facility];  Service: Vascular;  Laterality: Right;  . EXCISION OF TONGUE LESION Bilateral 05/05/2019   Procedure: EXCISION OF DORSAL TONGUE LESION;  Surgeon: Margaretha Sheffield, MD;  Location: [place];  Service: ENT;  Laterality: Bilateral;  . SHOULDER ARTHROSCOPY WITH ROTATOR CUFF REPAIR AND SUBACROMIAL DECOMPRESSION Left 01/09/2020   Procedure: Left shoulder arthroscopic  rotator cuff repair with Regeneten patch, subacromial decompression, and biceps tenodesis;  Surgeon: Leim Fabry, MD;  Location: [facility];  Service: Orthopedics;  Laterality: Left;  . skin cancer  removed      There were no vitals filed for this visit.   Subjective Assessment - 02/08/20 1220    Subjective Patient reports compliance with HEP. Went to [place] for his sisters funeral over the weekend/beginning of the week.    Pertinent History Right lateral thalamic infarct mild ataxia and mild hemiparesis. Patient went to rehab 07/07/19-07/15/19. Then he had surgery 7/1 for  s/p successful right CEA    How long can you sit comfortably? not related    How long can you stand comfortably? not related    How long can you walk comfortably? not related    Currently in Pain? Yes    Pain Score 3     Pain Location Shoulder    Pain Orientation Left    Pain Descriptors / Indicators Aching    Pain Type Surgical pain    Pain Onset More than a month ago    Pain Frequency Constant           3/10 pain levels      Treatment:  Manual:  Supine:  Passive L shoulder ROM with multipoint holds for tension reduction: Flexion 15x 20 second holds, abduction 10x 20 second holds, ER with elbow against side due to protocol 10x 20 second holds Passive L elbow ROM with elbow propped on pillow: flexion/extension 12x 20 second holds  tricep isometric 10x 5 second hold of  40-50% contraction   Seated: Scapular mobilizations: protraction/retraction 8x, depression/elevation 10x   Therex: cues for guided motion of AAROM introduction: Supine: -PVC pipe flexion to 90 degrees with RUE leading LUE 10x 10 second holds, guidance for AAROM; terminated due to pain  Seated:  -scapular retraction and depression pinching PT finger between shoulder blades 10x -scapular circles 10x clockwise, 10x counterclockwise  -UE ranger:  flexion with RUE on LUE for AAROM 12x 10 second holds, abd/add to neutral 12x 10 second holds, clockwise circles 10x, counterclockwise 10x  -table top slides with towel 10x with scapular retraction and depression at end of each range L hand grip strengthening squeezing with wrist flexion  10x L hand finger tip taps for stabilization   Education on straightening arm when standing to reduce shoulder elevation and protraction     Per physician note: He can begin to wean from the sling. Progress to motion as tolerated, moving from passive to active-assisted to active motion. For the first 4 weeks, forward flexion is limited to 100. External rotation with the arm by the side is allowed, but abduction-external rotation is not allowed for the first 6 weeks.                     PT Education - 02/08/20 1222    Education Details exercise technique, body mechanics    Person(s) Educated Patient    Methods Explanation;Demonstration;Tactile cues;Verbal cues    Comprehension Verbalized understanding;Returned demonstration;Verbal cues required;Tactile cues required            PT Short Term Goals - 01/17/20 1536      PT SHORT TERM GOAL #1   Title After 2 weeks pt will demontrate improve P/ROM shoulder flexion 100 degrees FF as per protocol.    Baseline 85 degrees    Time 2    Period Weeks    Status On-going    Target Date 01/27/20      PT SHORT TERM GOAL #2   Title After 2 weeks pt will demonstrate correct performance of HEP for P/ROM of shoulder, and AA/ROM of elbow, wrist, and hand.    Baseline Issued at evlauation    Time 2    Period Weeks    Status On-going    Target Date 01/27/20             PT Long Term Goals - 01/17/20 1538      PT LONG TERM GOAL #1   Title After 8 weeks pt will demonstrate improved daily function AEB FOTO: >43.    Baseline 22 at evaluation.    Time 8    Period Weeks    Status On-going    Target Date 03/09/20      PT LONG TERM GOAL #2   Title After 8 weeks pt will demonstrate Left shoulder flexion >135 degrees; MMT flexion 4/5, ABDCT: 5/5, elbow flexion: 5/5.    Baseline not assessed at eval    Time 8    Period Weeks    Status On-going    Target Date 03/09/20      PT LONG TERM GOAL #3   Title After 12 weeks pt  will demonstrate improve Left shoulder ROM (ER in 90* ABDCT of>80 degrees), flexion >150 degrees; MMT: Flexion: 4+/5, ER: 5/5, IR: 5/5    Baseline not assessed at eval    Time 12    Period Weeks    Status On-going    Target Date 04/06/20  Plan - 02/08/20 1224    Clinical Impression Statement Patient is progressing with functional PROM and AAROM with decreased pain and improved muscle contraction and relaxation. Patient is unable to perform supine AAROM this session however did excellent with seated AAROM on UE ranger. Education on straightening arm when standing to reduce shoulder elevation and protraction received well with patient demonstrating understanding. Pt will benefit from skilled PT intervetion to address these impairments in order to restore functional use, tolerance, and independence of LUE in ADL/IADL and leisure actiivty, as well as to improve safety at home and decrease caregiver burden.    Personal Factors and Comorbidities Age;Comorbidity 1    Comorbidities HTN, Hx CVA    Examination-Activity Limitations Stand;Bathing;Bed Mobility;Reach Overhead;Carry;Sleep    Examination-Participation Restrictions Driving;Yard Work;Meal Prep    Stability/Clinical Decision Making Stable/Uncomplicated    Rehab Potential Good    PT Frequency 2x / week    PT Duration 12 weeks    PT Treatment/Interventions Patient/family education;Balance training;Neuromuscular re-education;Therapeutic exercise;Stair training;Moist Heat;Functional mobility training;Therapeutic activities;Dry needling;Passive range of motion    PT Next Visit Plan Review HEP, continue with P/ROM, begin scapular strength training    PT Home Exercise Plan *see note    Consulted and Agree with Plan of Care Patient;Family member/caregiver    Family Member Consulted wife           Patient will benefit from skilled therapeutic intervention in order to improve the following deficits and impairments:  Decreased  activity tolerance,Decreased strength,Difficulty walking,Decreased balance,Decreased coordination,Decreased mobility,Decreased endurance  Visit Diagnosis: Stiffness of left shoulder, not elsewhere classified  Hemiparesis affecting left side as late effect of stroke ([facility])  Muscle weakness (generalized)  Other lack of coordination     Problem List Patient Active Problem List   Diagnosis Date Noted  . History of stroke with residual effects 11/17/2019  . Right shoulder pain 10/25/2019  . Weakness 10/25/2019  . Decreased GFR 08/15/2019  . Dyslipidemia 08/15/2019  . AKI (acute kidney injury) ([facility]) 08/15/2019  . Hemiparesis affecting left side as late effect of stroke ([facility]) 08/15/2019  . Carotid stenosis 07/29/2019  . Carotid stenosis, right 07/29/2019  . Carotid artery plaque, right 07/19/2019  . Hx of completed stroke 07/19/2019  . Stroke ([facility]) 07/06/2019  . TIA (transient ischemic attack) 07/05/2019  . Hypokalemia 07/05/2019  . GERD without esophagitis 06/18/2017  . Essential hypertension 08/19/2016  . Vertigo 12/05/2015  . Adenomatous colon polyp 01/31/2014  . Basal cell carcinoma 09/23/2013  . Hay fever 09/23/2013  . Vitamin D deficiency 09/23/2013  . Gastric catarrh 11/28/2011  . Hyperlipidemia, mixed 11/28/2011  . Preop cardiovascular exam 11/28/2011   Precious Bard, PT, DPT   02/08/2020, 12:26 PM  [place] [facility] MAIN [facility] SERVICES [address] [place], [place], [address] Phone: [phone]   Fax:  [phone]  Name: Russell Herman MRN: [id number] Date of Birth: [birth year]

## 2020-02-10 ENCOUNTER — Other Ambulatory Visit: Payer: Self-pay

## 2020-02-10 ENCOUNTER — Ambulatory Visit: Payer: Medicare Other

## 2020-02-10 DIAGNOSIS — I69354 Hemiplegia and hemiparesis following cerebral infarction affecting left non-dominant side: Secondary | ICD-10-CM

## 2020-02-10 DIAGNOSIS — M25612 Stiffness of left shoulder, not elsewhere classified: Secondary | ICD-10-CM | POA: Diagnosis not present

## 2020-02-10 DIAGNOSIS — M6281 Muscle weakness (generalized): Secondary | ICD-10-CM

## 2020-02-10 NOTE — Therapy (Signed)
[place] MAIN [facility] SERVICES [address] [place], [place], [address] Phone: [phone]   Fax:  [phone]  Physical Therapy Treatment  Patient Details  Name: Russell Herman MRN: [id number] Date of Birth: 08-03-1945 Referring Provider (PT): Leim Fabry MD (Orthopedics)   Encounter Date: 02/10/2020   PT End of Session - 02/10/20 0859    Visit Number 7    Number of Visits 25    Date for PT Re-Evaluation 04/06/20    Authorization Type Medicare    Authorization Time Period 01/13/20-04/06/20    PT Start Time 0759    PT Stop Time 0844    PT Time Calculation (min) 45 min    Equipment Utilized During Treatment Gait belt    Activity Tolerance Patient tolerated treatment well;No increased pain    Behavior During Therapy WFL for tasks assessed/performed           Past Medical History:  Diagnosis Date  . Adenomatous colon polyp   . Allergy   . Arthritis   . Cancer ([place])    basal cell skin  . Carotid stenosis   . Coronary artery disease   . GERD (gastroesophageal reflux disease)   . HLD (hyperlipidemia)   . Hypertension   . Pre-diabetes   . Stroke ([place])   . Wears hearing aid in both ears     Past Surgical History:  Procedure Laterality Date  . cyst removed    . ENDARTERECTOMY Right 08/04/2019   Procedure: ENDARTERECTOMY CAROTID;  Surgeon: Algernon Huxley, MD;  Location: [facility];  Service: Vascular;  Laterality: Right;  . EXCISION OF TONGUE LESION Bilateral 05/05/2019   Procedure: EXCISION OF DORSAL TONGUE LESION;  Surgeon: Margaretha Sheffield, MD;  Location: [place];  Service: ENT;  Laterality: Bilateral;  . SHOULDER ARTHROSCOPY WITH ROTATOR CUFF REPAIR AND SUBACROMIAL DECOMPRESSION Left 01/09/2020   Procedure: Left shoulder arthroscopic  rotator cuff repair with Regeneten patch, subacromial decompression, and biceps tenodesis;  Surgeon: Leim Fabry, MD;  Location: [facility];  Service: Orthopedics;  Laterality: Left;  . skin cancer  removed      There were no vitals filed for this visit.   Subjective Assessment - 02/10/20 0857    Subjective Patient reports compliance with HEP. Feeling stiffer than normal due to the colder weather.    Pertinent History Right lateral thalamic infarct mild ataxia and mild hemiparesis. Patient went to rehab 07/07/19-07/15/19. Then he had surgery 7/1 for  s/p successful right CEA    How long can you sit comfortably? not related    How long can you stand comfortably? not related    How long can you walk comfortably? not related    Currently in Pain? Yes    Pain Score 3     Pain Location Shoulder    Pain Orientation Left    Pain Descriptors / Indicators Aching    Pain Type Surgical pain    Pain Onset More than a month ago    Pain Frequency Constant                Treatment:  Manual:  Supine:  Passive L shoulder  ROM with multipoint holds for tension reduction: Flexion 15x 20 second holds, abduction 10x 20 second holds, ER with elbow against side due to protocol 10x 20 second holds Passive L elbow ROM with elbow propped on pillow: flexion/extension 12x 20 second holds  tricep isometric 10x 5 second hold of 40-50% contraction    Seated:  Scapular mobilizations: protraction/retraction 8x, depression/elevation 10x   Isometrics: cues for 50-60% activation for muscle strengthening and stabilization  -flexion 10x5 second holds -abduction 10x5 second holds -adduction 10x 5 second holds -extension 10x 5 second holds  -ER 10x5 second holds -IR 10x 5 second holds    Therex: cues for guided motion of AAROM introduction: Supine: AROM LLE to 45 degree angle, occasional min A to return to starting position 12x    Seated:  -scapular retraction and depression pinching PT finger between shoulder blades 10x -scapular circles 10x clockwise, 10x counterclockwise  -UE ranger:  flexion with RUE on LUE for AAROM 12x 10 second holds, abd/add to neutral 12x 10 second holds, clockwise circles  10x, counterclockwise 10x ; ABC Education on straightening arm when standing to reduce shoulder elevation and protraction    Standing: -pendulum swing 10x each direction     Per physician note: He can begin to wean from the sling. Progress to motion as tolerated, moving from passive to active-assisted to active motion. For the first 4 weeks, forward flexion is limited to 100. External rotation with the arm by the side is allowed, but abduction-external rotation is not allowed for the first 6 weeks                      PT Education - 02/10/20 0858    Education Details exercise technique, body mechanics, introduce AROM and isometrics    Person(s) Educated Patient    Methods Explanation;Demonstration;Tactile cues;Verbal cues    Comprehension Verbalized understanding;Returned demonstration;Verbal cues required;Tactile cues required            PT Short Term Goals - 01/17/20 1536      PT SHORT TERM GOAL #1   Title After 2 weeks pt will demontrate improve P/ROM shoulder flexion 100 degrees FF as per protocol.    Baseline 85 degrees    Time 2    Period Weeks    Status On-going    Target Date 01/27/20      PT SHORT TERM GOAL #2   Title After 2 weeks pt will demonstrate correct performance of HEP for P/ROM of shoulder, and AA/ROM of elbow, wrist, and hand.    Baseline Issued at evlauation    Time 2    Period Weeks    Status On-going    Target Date 01/27/20             PT Long Term Goals - 01/17/20 1538      PT LONG TERM GOAL #1   Title After 8 weeks pt will demonstrate improved daily function AEB FOTO: >43.    Baseline 22 at evaluation.    Time 8    Period Weeks    Status On-going    Target Date 03/09/20      PT LONG TERM GOAL #2   Title After 8 weeks pt will demonstrate Left shoulder flexion >135 degrees; MMT flexion 4/5, ABDCT: 5/5, elbow flexion: 5/5.    Baseline not assessed at eval    Time 8    Period Weeks    Status On-going    Target Date  03/09/20      PT LONG TERM GOAL #3   Title After 12 weeks pt will demonstrate improve Left shoulder ROM (ER in 90* ABDCT of>80 degrees), flexion >150 degrees; MMT: Flexion: 4+/5, ER: 5/5, IR: 5/5    Baseline not assessed at eval    Time 12    Period Weeks  Status On-going    Target Date 04/06/20                 Plan - 02/10/20 0900    Clinical Impression Statement Patient continues to progress with protocol allowing introduce to AROM and PROM past 100 degrees. Patient challenged with pain and muscle guarding past 90 degrees. Educated on need for icing compliance for pain reduction. Isometrics introduced and tolerated well. Pt will benefit from skilled PT intervetion to address these impairments in order to restore functional use, tolerance, and independence of LUE in ADL/IADL and leisure actiivty, as well as to improve safety at home and decrease caregiver burden.    Personal Factors and Comorbidities Age;Comorbidity 1    Comorbidities HTN, Hx CVA    Examination-Activity Limitations Stand;Bathing;Bed Mobility;Reach Overhead;Carry;Sleep    Examination-Participation Restrictions Driving;Yard Work;Meal Prep    Stability/Clinical Decision Making Stable/Uncomplicated    Rehab Potential Good    PT Frequency 2x / week    PT Duration 12 weeks    PT Treatment/Interventions Patient/family education;Balance training;Neuromuscular re-education;Therapeutic exercise;Stair training;Moist Heat;Functional mobility training;Therapeutic activities;Dry needling;Passive range of motion    PT Next Visit Plan Review HEP, continue with P/ROM, begin scapular strength training    PT Home Exercise Plan *see note    Consulted and Agree with Plan of Care Patient;Family member/caregiver    Family Member Consulted wife           Patient will benefit from skilled therapeutic intervention in order to improve the following deficits and impairments:  Decreased activity tolerance,Decreased strength,Difficulty  walking,Decreased balance,Decreased coordination,Decreased mobility,Decreased endurance  Visit Diagnosis: Stiffness of left shoulder, not elsewhere classified  Hemiparesis affecting left side as late effect of stroke ([place])  Muscle weakness (generalized)     Problem List Patient Active Problem List   Diagnosis Date Noted  . History of stroke with residual effects 11/17/2019  . Right shoulder pain 10/25/2019  . Weakness 10/25/2019  . Decreased GFR 08/15/2019  . Dyslipidemia 08/15/2019  . AKI (acute kidney injury) ([place]) 08/15/2019  . Hemiparesis affecting left side as late effect of stroke ([place]) 08/15/2019  . Carotid stenosis 07/29/2019  . Carotid stenosis, right 07/29/2019  . Carotid artery plaque, right 07/19/2019  . Hx of completed stroke 07/19/2019  . Stroke ([place]) 07/06/2019  . TIA (transient ischemic attack) 07/05/2019  . Hypokalemia 07/05/2019  . GERD without esophagitis 06/18/2017  . Essential hypertension 08/19/2016  . Vertigo 12/05/2015  . Adenomatous colon polyp 01/31/2014  . Basal cell carcinoma 09/23/2013  . Hay fever 09/23/2013  . Vitamin D deficiency 09/23/2013  . Gastric catarrh 11/28/2011  . Hyperlipidemia, mixed 11/28/2011  . Preop cardiovascular exam 11/28/2011   Janna Arch, PT, DPT   02/10/2020, 9:01 AM  [place] MAIN [facility] SERVICES [address] [place], [place], [address] Phone: [phone]   Fax:  [phone]  Name: Russell Herman MRN: [id number] Date of Birth: 06-Nov-1945

## 2020-02-13 ENCOUNTER — Other Ambulatory Visit: Payer: Self-pay

## 2020-02-13 ENCOUNTER — Ambulatory Visit: Payer: Medicare Other

## 2020-02-13 DIAGNOSIS — M25612 Stiffness of left shoulder, not elsewhere classified: Secondary | ICD-10-CM | POA: Diagnosis not present

## 2020-02-13 DIAGNOSIS — R278 Other lack of coordination: Secondary | ICD-10-CM

## 2020-02-13 DIAGNOSIS — I69354 Hemiplegia and hemiparesis following cerebral infarction affecting left non-dominant side: Secondary | ICD-10-CM

## 2020-02-13 DIAGNOSIS — M6281 Muscle weakness (generalized): Secondary | ICD-10-CM

## 2020-02-13 NOTE — Therapy (Signed)
[place] MAIN [facility] SERVICES [address] [place], [place], [address] Phone: [phone]   Fax:  [phone]  Physical Therapy Treatment  Patient Details  Name: Russell Herman MRN: [id number] Date of Birth: 1945/04/02 Referring Provider (PT): Leim Fabry MD (Orthopedics)   Encounter Date: 02/13/2020   PT End of Session - 02/13/20 1248    Visit Number 8    Number of Visits 25    Date for PT Re-Evaluation 04/06/20    Authorization Type Medicare    Authorization Time Period 01/13/20-04/06/20    PT Start Time 1100    PT Stop Time 1144    PT Time Calculation (min) 44 min    Equipment Utilized During Treatment Gait belt    Activity Tolerance Patient tolerated treatment well;No increased pain    Behavior During Therapy WFL for tasks assessed/performed           Past Medical History:  Diagnosis Date  . Adenomatous colon polyp   . Allergy   . Arthritis   . Cancer ([place])    basal cell skin  . Carotid stenosis   . Coronary artery disease   . GERD (gastroesophageal reflux disease)   . HLD (hyperlipidemia)   . Hypertension   . Pre-diabetes   . Stroke ([place])   . Wears hearing aid in both ears     Past Surgical History:  Procedure Laterality Date  . cyst removed    . ENDARTERECTOMY Right 08/04/2019   Procedure: ENDARTERECTOMY CAROTID;  Surgeon: Algernon Huxley, MD;  Location: [facility];  Service: Vascular;  Laterality: Right;  . EXCISION OF TONGUE LESION Bilateral 05/05/2019   Procedure: EXCISION OF DORSAL TONGUE LESION;  Surgeon: Margaretha Sheffield, MD;  Location: [facility];  Service: ENT;  Laterality: Bilateral;  . SHOULDER ARTHROSCOPY WITH ROTATOR CUFF REPAIR AND SUBACROMIAL DECOMPRESSION Left 01/09/2020   Procedure: Left shoulder arthroscopic  rotator cuff repair with Regeneten patch, subacromial decompression, and biceps tenodesis;  Surgeon: Leim Fabry, MD;  Location: [facility];  Service: Orthopedics;  Laterality: Left;  . skin cancer  removed      There were no vitals filed for this visit.   Subjective Assessment - 02/13/20 1233    Subjective Patient reports feeling sore after last session for a few days. Reports the pain was manageable but noticeable.    Pertinent History Right lateral thalamic infarct mild ataxia and mild hemiparesis. Patient went to rehab 07/07/19-07/15/19. Then he had surgery 7/1 for  s/p successful right CEA    How long can you sit comfortably? not related    How long can you stand comfortably? not related    How long can you walk comfortably? not related    Currently in Pain? Yes    Pain Score 3     Pain Location Shoulder    Pain Orientation Left    Pain Descriptors / Indicators Aching    Pain Type Surgical pain    Pain Onset More than a month ago    Pain Frequency Constant                Patient reports increase in pain over the weekend. Used ice to help with pain.      Treatment:  Manual:  Supine:  Passive L shoulder ROM with multipoint holds for tension reduction: Flexion 15x 20 second holds, abduction 10x 20 second holds, ER with elbow against side due to protocol 10x 20 second holds Passive L elbow ROM with elbow  propped on pillow: flexion/extension 12x 20 second holds  tricep isometric 10x 5 second hold of 40-50% contraction    Seated: Scapular mobilizations: protraction/retraction 8x, depression/elevation 10x       Therex: cues for guided motion of AAROM introduction: Supine: AROM LLE to 45 degree angle, occasional min A to return to starting position 12x  Isometrics: cues for 50-60% activation for muscle strengthening and stabilization  -flexion 10x5 second holds -abduction 10x5 second holds -adduction 10x 5 second holds -extension 10x 5 second holds  -ER 10x5 second holds -IR 10x 5 second holds   Seated:  -scapular retraction and depression pinching PT finger between shoulder blades 10x -scapular circles 10x clockwise, 10x counterclockwise  -UE ranger:  flexion  with RUE on LUE for AAROM 12x 10 second holds, abd/add to neutral 12x 10 second holds, clockwise circles 10x, counterclockwise 10x ; ABC Education on straightening arm when standing to reduce shoulder elevation and protraction    Standing: -pendulum swing 10x each direction -walk walk 5x ; cues for neutral alignment of shoulder, hold 10 seconds     Per physician note: He can begin to wean from the sling. Progress to motion as tolerated, moving from passive to active-assisted to active motion. For the first 4 weeks, forward flexion is limited to 100. External rotation with the arm by the side is allowed, but abduction-external rotation is not allowed for the first 6 weeks                    PT Education - 02/13/20 1246    Education Details exercise technique, body mechanics, AROM, isometrics    Person(s) Educated Patient    Methods Explanation;Demonstration;Verbal cues;Tactile cues    Comprehension Verbalized understanding;Returned demonstration;Verbal cues required;Tactile cues required            PT Short Term Goals - 01/17/20 1536      PT SHORT TERM GOAL #1   Title After 2 weeks pt will demontrate improve P/ROM shoulder flexion 100 degrees FF as per protocol.    Baseline 85 degrees    Time 2    Period Weeks    Status On-going    Target Date 01/27/20      PT SHORT TERM GOAL #2   Title After 2 weeks pt will demonstrate correct performance of HEP for P/ROM of shoulder, and AA/ROM of elbow, wrist, and hand.    Baseline Issued at evlauation    Time 2    Period Weeks    Status On-going    Target Date 01/27/20             PT Long Term Goals - 01/17/20 1538      PT LONG TERM GOAL #1   Title After 8 weeks pt will demonstrate improved daily function AEB FOTO: >43.    Baseline 22 at evaluation.    Time 8    Period Weeks    Status On-going    Target Date 03/09/20      PT LONG TERM GOAL #2   Title After 8 weeks pt will demonstrate Left shoulder flexion >135  degrees; MMT flexion 4/5, ABDCT: 5/5, elbow flexion: 5/5.    Baseline not assessed at eval    Time 8    Period Weeks    Status On-going    Target Date 03/09/20      PT LONG TERM GOAL #3   Title After 12 weeks pt will demonstrate improve Left shoulder ROM (ER in 90* ABDCT of>80  degrees), flexion >150 degrees; MMT: Flexion: 4+/5, ER: 5/5, IR: 5/5    Baseline not assessed at eval    Time 12    Period Weeks    Status On-going    Target Date 04/06/20                 Plan - 02/13/20 1249    Clinical Impression Statement Patient continues to be challenged with maintaining a neutral arm alignment with preference for elbow abducted and internally rotated position. Pain at end range is limiting progressive AROM and PROM at this time however AAROM is improving at this point. Educated on need for icing compliance for pain reduction. Isometrics introduced and tolerated well. Pt will benefit from skilled PT intervetion to address these impairments in order to restore functional use, tolerance, and independence of LUE in ADL/IADL and leisure actiivty, as well as to improve safety at home and decrease caregiver burden.    Personal Factors and Comorbidities Age;Comorbidity 1    Comorbidities HTN, Hx CVA    Examination-Activity Limitations Stand;Bathing;Bed Mobility;Reach Overhead;Carry;Sleep    Examination-Participation Restrictions Driving;Yard Work;Meal Prep    Stability/Clinical Decision Making Stable/Uncomplicated    Rehab Potential Good    PT Frequency 2x / week    PT Duration 12 weeks    PT Treatment/Interventions Patient/family education;Balance training;Neuromuscular re-education;Therapeutic exercise;Stair training;Moist Heat;Functional mobility training;Therapeutic activities;Dry needling;Passive range of motion    PT Next Visit Plan Review HEP, continue with P/ROM, begin scapular strength training    PT Home Exercise Plan *see note    Consulted and Agree with Plan of Care Patient;Family  member/caregiver    Family Member Consulted wife           Patient will benefit from skilled therapeutic intervention in order to improve the following deficits and impairments:  Decreased activity tolerance,Decreased strength,Difficulty walking,Decreased balance,Decreased coordination,Decreased mobility,Decreased endurance  Visit Diagnosis: Stiffness of left shoulder, not elsewhere classified  Hemiparesis affecting left side as late effect of stroke ([facility])  Muscle weakness (generalized)  Other lack of coordination     Problem List Patient Active Problem List   Diagnosis Date Noted  . History of stroke with residual effects 11/17/2019  . Right shoulder pain 10/25/2019  . Weakness 10/25/2019  . Decreased GFR 08/15/2019  . Dyslipidemia 08/15/2019  . AKI (acute kidney injury) ([place]) 08/15/2019  . Hemiparesis affecting left side as late effect of stroke ([place]) 08/15/2019  . Carotid stenosis 07/29/2019  . Carotid stenosis, right 07/29/2019  . Carotid artery plaque, right 07/19/2019  . Hx of completed stroke 07/19/2019  . Stroke ([place]) 07/06/2019  . TIA (transient ischemic attack) 07/05/2019  . Hypokalemia 07/05/2019  . GERD without esophagitis 06/18/2017  . Essential hypertension 08/19/2016  . Vertigo 12/05/2015  . Adenomatous colon polyp 01/31/2014  . Basal cell carcinoma 09/23/2013  . Hay fever 09/23/2013  . Vitamin D deficiency 09/23/2013  . Gastric catarrh 11/28/2011  . Hyperlipidemia, mixed 11/28/2011  . Preop cardiovascular exam 11/28/2011   Janna Arch, PT, DPT   02/13/2020, 12:50 PM  [place] MAIN [facility] SERVICES [address] [place], [place], [address] Phone: [phone]   Fax:  [phone]  Name: Russell Herman MRN: [id number] Date of Birth: 03/07/1945

## 2020-02-15 ENCOUNTER — Other Ambulatory Visit: Payer: Self-pay

## 2020-02-15 ENCOUNTER — Ambulatory Visit: Payer: Medicare Other

## 2020-02-15 DIAGNOSIS — M25612 Stiffness of left shoulder, not elsewhere classified: Secondary | ICD-10-CM | POA: Diagnosis not present

## 2020-02-15 DIAGNOSIS — M6281 Muscle weakness (generalized): Secondary | ICD-10-CM

## 2020-02-15 DIAGNOSIS — R278 Other lack of coordination: Secondary | ICD-10-CM

## 2020-02-15 DIAGNOSIS — I69354 Hemiplegia and hemiparesis following cerebral infarction affecting left non-dominant side: Secondary | ICD-10-CM

## 2020-02-15 NOTE — Therapy (Signed)
[place] MAIN [facility] SERVICES [address] [place], [place], [address] Phone: [phone]   Fax:  [phone]  Physical Therapy Treatment  Patient Details  Name: Russell Herman MRN: [id number] Date of Birth: 1945/04/18 Referring Provider (PT): Leim Fabry MD (Orthopedics)   Encounter Date: 02/15/2020   PT End of Session - 02/15/20 1310    Visit Number 9    Number of Visits 25    Date for PT Re-Evaluation 04/06/20    Authorization Type Medicare    Authorization Time Period 01/13/20-04/06/20    PT Start Time 1100    PT Stop Time 1144    PT Time Calculation (min) 44 min    Equipment Utilized During Treatment Gait belt    Activity Tolerance Patient tolerated treatment well;No increased pain    Behavior During Therapy WFL for tasks assessed/performed           Past Medical History:  Diagnosis Date  . Adenomatous colon polyp   . Allergy   . Arthritis   . Cancer ([place])    basal cell skin  . Carotid stenosis   . Coronary artery disease   . GERD (gastroesophageal reflux disease)   . HLD (hyperlipidemia)   . Hypertension   . Pre-diabetes   . Stroke ([place])   . Wears hearing aid in both ears     Past Surgical History:  Procedure Laterality Date  . cyst removed    . ENDARTERECTOMY Right 08/04/2019   Procedure: ENDARTERECTOMY CAROTID;  Surgeon: Algernon Huxley, MD;  Location: [facility];  Service: Vascular;  Laterality: Right;  . EXCISION OF TONGUE LESION Bilateral 05/05/2019   Procedure: EXCISION OF DORSAL TONGUE LESION;  Surgeon: Margaretha Sheffield, MD;  Location: [place];  Service: ENT;  Laterality: Bilateral;  . SHOULDER ARTHROSCOPY WITH ROTATOR CUFF REPAIR AND SUBACROMIAL DECOMPRESSION Left 01/09/2020   Procedure: Left shoulder arthroscopic  rotator cuff repair with Regeneten patch, subacromial decompression, and biceps tenodesis;  Surgeon: Leim Fabry, MD;  Location: [facility];  Service: Orthopedics;  Laterality: Left;  . skin cancer  removed      There were no vitals filed for this visit.   Subjective Assessment - 02/15/20 1309    Subjective Patient presents with wife. Reports pain increase of 1 point after last session. Has been compliant with HEP with no falls or LOB.    Pertinent History Right lateral thalamic infarct mild ataxia and mild hemiparesis. Patient went to rehab 07/07/19-07/15/19. Then he had surgery 7/1 for  s/p successful right CEA    How long can you sit comfortably? not related    How long can you stand comfortably? not related    How long can you walk comfortably? not related    Currently in Pain? Yes    Pain Score 3     Pain Location Shoulder    Pain Orientation Left    Pain Descriptors / Indicators Aching    Pain Type Surgical pain    Pain Onset More than a month ago                 Treatment:  Manual:  Supine:  Passive L shoulder ROM with multipoint holds for tension reduction: Flexion 15x 20 second holds, abduction 10x 20 second holds, ER with elbow against side due to protocol 10x 20 second holds Passive L elbow ROM with elbow propped on pillow: flexion/extension 12x 20 second holds  tricep isometric 10x 5 second hold of 40-50% contraction  Seated: Scapular mobilizations: protraction/retraction 8x, depression/elevation 10x   STM to upper trap and anterior aspect of glenohumeral joint x6 minutes Cross friction scar tissue massage-educated wife on performing at home x3 minutes      Therex: cues for guided motion of AAROM introduction: Supine: AROM LUE to 45 degree angle, occasional min A to return to starting position 12x AAROM arms on large swiss ball: forward flexion 12x 10 second holds with increasing arm flexion Seated:  -scapular retraction and depression pinching PT finger between shoulder blades 10x -scapular circles 10x clockwise, 10x counterclockwise  -UE ranger; ABC Education on straightening arm when standing to reduce shoulder elevation and protraction     Standing: -pendulum swing 10x each direction -wall walk 7x ; cues for neutral alignment of shoulder, hold 10 seconds     Per physician note: He can begin to wean from the sling. Progress to motion as tolerated, moving from passive to active-assisted to active motion. For the first 4 weeks, forward flexion is limited to 100. External rotation with the arm by the side is allowed, but abduction-external rotation is not allowed for the first 6 weeks     Patient's wife was educated on scar tissue massage this session with patient reporting decreased stiffness by end of session. ROM continues to be limited by patient's pain, guarding, and tone in surgical arm. Will address goals next session for progress note. Pt will benefit from skilled PT intervetion to address these impairments in order to restore functional use, tolerance, and independence of LUE in ADL/IADL and leisure actiivty, as well as to improve safety at home and decrease caregiver burden.                     PT Education - 02/15/20 1310    Education Details manual, therex, PROM, AAROM    Person(s) Educated Patient;Spouse    Methods Explanation;Demonstration;Tactile cues;Verbal cues    Comprehension Verbalized understanding;Returned demonstration;Verbal cues required;Tactile cues required            PT Short Term Goals - 01/17/20 1536      PT SHORT TERM GOAL #1   Title After 2 weeks pt will demontrate improve P/ROM shoulder flexion 100 degrees FF as per protocol.    Baseline 85 degrees    Time 2    Period Weeks    Status On-going    Target Date 01/27/20      PT SHORT TERM GOAL #2   Title After 2 weeks pt will demonstrate correct performance of HEP for P/ROM of shoulder, and AA/ROM of elbow, wrist, and hand.    Baseline Issued at evlauation    Time 2    Period Weeks    Status On-going    Target Date 01/27/20             PT Long Term Goals - 01/17/20 1538      PT LONG TERM GOAL #1   Title After 8  weeks pt will demonstrate improved daily function AEB FOTO: >43.    Baseline 22 at evaluation.    Time 8    Period Weeks    Status On-going    Target Date 03/09/20      PT LONG TERM GOAL #2   Title After 8 weeks pt will demonstrate Left shoulder flexion >135 degrees; MMT flexion 4/5, ABDCT: 5/5, elbow flexion: 5/5.    Baseline not assessed at eval    Time 8    Period Weeks  Status On-going    Target Date 03/09/20      PT LONG TERM GOAL #3   Title After 12 weeks pt will demonstrate improve Left shoulder ROM (ER in 90* ABDCT of>80 degrees), flexion >150 degrees; MMT: Flexion: 4+/5, ER: 5/5, IR: 5/5    Baseline not assessed at eval    Time 12    Period Weeks    Status On-going    Target Date 04/06/20                 Plan - 02/15/20 1511    Clinical Impression Statement Patient's wife was educated on scar tissue massage this session with patient reporting decreased stiffness by end of session. ROM continues to be limited by patient's pain, guarding, and tone in surgical arm. Will address goals next session for progress note. Pt will benefit from skilled PT intervetion to address these impairments in order to restore functional use, tolerance, and independence of LUE in ADL/IADL and leisure actiivty, as well as to improve safety at home and decrease caregiver burden.    Personal Factors and Comorbidities Age;Comorbidity 1    Comorbidities HTN, Hx CVA    Examination-Activity Limitations Stand;Bathing;Bed Mobility;Reach Overhead;Carry;Sleep    Examination-Participation Restrictions Driving;Yard Work;Meal Prep    Stability/Clinical Decision Making Stable/Uncomplicated    Rehab Potential Good    PT Frequency 2x / week    PT Duration 12 weeks    PT Treatment/Interventions Patient/family education;Balance training;Neuromuscular re-education;Therapeutic exercise;Stair training;Moist Heat;Functional mobility training;Therapeutic activities;Dry needling;Passive range of motion    PT  Next Visit Plan Review HEP, continue with P/ROM, begin scapular strength training    PT Home Exercise Plan *see note    Consulted and Agree with Plan of Care Patient;Family member/caregiver    Family Member Consulted wife           Patient will benefit from skilled therapeutic intervention in order to improve the following deficits and impairments:  Decreased activity tolerance,Decreased strength,Difficulty walking,Decreased balance,Decreased coordination,Decreased mobility,Decreased endurance  Visit Diagnosis: Stiffness of left shoulder, not elsewhere classified  Hemiparesis affecting left side as late effect of stroke (HCC)  Muscle weakness (generalized)  Other lack of coordination     Problem List Patient Active Problem List   Diagnosis Date Noted  . History of stroke with residual effects 11/17/2019  . Right shoulder pain 10/25/2019  . Weakness 10/25/2019  . Decreased GFR 08/15/2019  . Dyslipidemia 08/15/2019  . AKI (acute kidney injury) ([place]) 08/15/2019  . Hemiparesis affecting left side as late effect of stroke ([place]) 08/15/2019  . Carotid stenosis 07/29/2019  . Carotid stenosis, right 07/29/2019  . Carotid artery plaque, right 07/19/2019  . Hx of completed stroke 07/19/2019  . Stroke ([place]) 07/06/2019  . TIA (transient ischemic attack) 07/05/2019  . Hypokalemia 07/05/2019  . GERD without esophagitis 06/18/2017  . Essential hypertension 08/19/2016  . Vertigo 12/05/2015  . Adenomatous colon polyp 01/31/2014  . Basal cell carcinoma 09/23/2013  . Hay fever 09/23/2013  . Vitamin D deficiency 09/23/2013  . Gastric catarrh 11/28/2011  . Hyperlipidemia, mixed 11/28/2011  . Preop cardiovascular exam 11/28/2011   Janna Arch, PT, DPT   02/15/2020, 3:13 PM  [place] MAIN [facility] SERVICES [address] [place], [place], [address] Phone: [phone]   Fax:  [phone]  Name: Russell Herman MRN: [id number] Date of Birth:  [birth year]

## 2020-02-20 ENCOUNTER — Ambulatory Visit: Payer: Medicare Other

## 2020-02-22 ENCOUNTER — Ambulatory Visit: Payer: Medicare Other

## 2020-02-27 ENCOUNTER — Ambulatory Visit: Payer: Medicare Other

## 2020-02-29 ENCOUNTER — Ambulatory Visit: Payer: Medicare Other

## 2020-03-05 ENCOUNTER — Ambulatory Visit: Payer: Medicare Other

## 2020-03-05 ENCOUNTER — Other Ambulatory Visit: Payer: Self-pay

## 2020-03-05 DIAGNOSIS — M25612 Stiffness of left shoulder, not elsewhere classified: Secondary | ICD-10-CM | POA: Diagnosis not present

## 2020-03-05 DIAGNOSIS — I69354 Hemiplegia and hemiparesis following cerebral infarction affecting left non-dominant side: Secondary | ICD-10-CM

## 2020-03-05 DIAGNOSIS — M6281 Muscle weakness (generalized): Secondary | ICD-10-CM

## 2020-03-05 NOTE — Therapy (Signed)
[place] MAIN [facility] SERVICES [address] [place], [place], [address] Phone: [phone]   Fax:  [phone]  Physical Therapy Treatment Physical Therapy Progress Note   Dates of reporting period  01/13/20   to   03/05/20  Patient Details  Name: Russell Herman MRN: [id number] Date of Birth: 1945/11/04 Referring Provider (PT): Leim Fabry MD (Orthopedics)   Encounter Date: 03/05/2020   PT End of Session - 03/05/20 1130    Visit Number 10    Number of Visits 25    Date for PT Re-Evaluation 04/06/20    Authorization Type Medicare; next session 1/10 PN 03/05/20    Authorization Time Period 01/13/20-04/06/20    PT Start Time 1101    PT Stop Time 1144    PT Time Calculation (min) 43 min    Equipment Utilized During Treatment Gait belt    Activity Tolerance Patient tolerated treatment well;No increased pain    Behavior During Therapy WFL for tasks assessed/performed           Past Medical History:  Diagnosis Date  . Adenomatous colon polyp   . Allergy   . Arthritis   . Cancer ([place])    basal cell skin  . Carotid stenosis   . Coronary artery disease   . GERD (gastroesophageal reflux disease)   . HLD (hyperlipidemia)   . Hypertension   . Pre-diabetes   . Stroke ([place])   . Wears hearing aid in both ears     Past Surgical History:  Procedure Laterality Date  . cyst removed    . ENDARTERECTOMY Right 08/04/2019   Procedure: ENDARTERECTOMY CAROTID;  Surgeon: Algernon Huxley, MD;  Location: [facility];  Service: Vascular;  Laterality: Right;  . EXCISION OF TONGUE LESION Bilateral 05/05/2019   Procedure: EXCISION OF DORSAL TONGUE LESION;  Surgeon: Margaretha Sheffield, MD;  Location: [place];  Service: ENT;  Laterality: Bilateral;  . SHOULDER ARTHROSCOPY WITH ROTATOR CUFF REPAIR AND SUBACROMIAL DECOMPRESSION Left 01/09/2020   Procedure: Left shoulder arthroscopic  rotator cuff repair with Regeneten patch, subacromial decompression, and biceps  tenodesis;  Surgeon: Leim Fabry, MD;  Location: [facility];  Service: Orthopedics;  Laterality: Left;  . skin cancer removed      There were no vitals filed for this visit.   Subjective Assessment - 03/05/20 1126    Subjective Patient returns to PT after 3 week absence. Since seen last has been to the physician and reports his visit was good. Has been compliant with HEP. Reports physician was supposed to send something to this [place] [facility] however nothing has been received.    Pertinent History Right lateral thalamic infarct mild ataxia and mild hemiparesis. Patient went to rehab 07/07/19-07/15/19. Then he had surgery 7/1 for  s/p successful right CEA    How long can you sit comfortably? not related    How long can you stand comfortably? not related    How long can you walk comfortably? not related    Currently in Pain? Yes    Pain Score 2     Pain Location Shoulder    Pain Orientation Left    Pain Descriptors / Indicators Aching    Pain Type Surgical pain    Pain Onset More than a month ago              Hasn't been seen in approximately 3 weeks. Patient reports his sister passed away from cancer.  Patient did see physician and reports he was  pleased with his progress. Pain is about a 2/10    Goals:  Seated AROM: Flexion: 91 Abduction 82   FOTO: 50%  MMT: limited by pain and limited ROM.    Access Code: [email or web address] URL: [email or web address] Date: 03/05/2020 Prepared by: Janna Arch  Exercises Supine Shoulder Flexion Extension Full Range AROM - 1 x daily - 7 x weekly - 2 sets - 10 reps - 5 hold Supine Shoulder Abduction AROM - 1 x daily - 7 x weekly - 2 sets - 10 reps - 5 hold Sidelying Shoulder External Rotation - 1 x daily - 7 x weekly - 2 sets - 10 reps - 5 hold  Treatment:  SciFit; Lvl 3, 2 minutes forward, 2 minutes backwards, cueing for keeping elbow into side  Manual:  Supine:  Passive L shoulder ROM with multipoint holds for tension reduction: Flexion  15x 20 second holds, abduction 10x20second holds, ER with elbow against side due to protocol 10x 20 second holds Passive L elbow ROM with elbow propped on pillow: flexion/extension 12x20 second holds   Therex: cues for guided motion  Standing: -pendulum swing 10x each direction -wall walk 7x ; cues for neutral alignment of shoulder, hold 10 seconds  Seated: -scapular retraction/row 15x -UE ranger flexion, scapular retraction with extension 10x -UE ranger circles: clockwise 10x, counterclockwise 10x    Patient's condition has the potential to improve in response to therapy. Maximum improvement is yet to be obtained. The anticipated improvement is attainable and reasonable in a generally predictable time.  Patient reports his motion is improving but continues to be limited.   Patient returns to physical therapy for the first time after a three week absence. Goals performed with patient able to perform active range of motion for the first time. Active and Passive ROM continue to be limited and painful at this time.  Patient's condition has the potential to improve in response to therapy. Maximum improvement is yet to be obtained. The anticipated improvement is attainable and reasonable in a generally predictable time. Pt will benefit from skilled PT intervetion to address these impairments in order to restore functional use, tolerance, and independence of LUE in ADL/IADL and leisure actiivty, as well as to improve safety at home and decrease caregiver burden.                       PT Education - 03/05/20 1130    Education Details exercise technique, boals, AROM    Person(s) Educated Patient    Methods Explanation;Demonstration;Tactile cues;Verbal cues    Comprehension Verbalized understanding;Returned demonstration;Verbal cues required;Tactile cues required            PT Short Term Goals - 03/05/20 1247      PT SHORT TERM GOAL #1   Title After 2 weeks pt will  demontrate improve P/ROM shoulder flexion 100 degrees FF as per protocol.    Baseline 85 degrees 1/31: 93 degrees    Time 2    Period Weeks    Status Partially Met    Target Date 03/19/20      PT SHORT TERM GOAL #2   Title After 2 weeks pt will demonstrate correct performance of HEP for P/ROM of shoulder, and AA/ROM of elbow, wrist, and hand.    Baseline Issued at evlauation 1/31: HEP progressed    Time 2    Period Weeks    Status Partially Met    Target Date 03/19/20  PT Long Term Goals - 03/05/20 1248      PT LONG TERM GOAL #1   Title After 8 weeks pt will demonstrate improved daily function AEB FOTO: >43.    Baseline 22 at evaluation. 1/31: 50%    Time 8    Period Weeks    Status Achieved      PT LONG TERM GOAL #2   Title After 8 weeks pt will demonstrate Left shoulder flexion >135 degrees; MMT flexion 4/5, ABDCT: 5/5, elbow flexion: 5/5.    Baseline not assessed at eval 1/31: flexion arom 91, MMT 2+/5    Time 8    Period Weeks    Status Partially Met    Target Date 03/09/20      PT LONG TERM GOAL #3   Title After 12 weeks pt will demonstrate improve Left shoulder ROM (ER in 90* ABDCT of>80 degrees), flexion >150 degrees; MMT: Flexion: 4+/5, ER: 5/5, IR: 5/5    Baseline not assessed at eval 1/31: flexion AROM 91    Time 12    Period Weeks    Status Partially Met    Target Date 04/06/20                 Plan - 03/05/20 1244    Clinical Impression Statement Patient returns to physical therapy for the first time after a three week absence. Goals performed with patient able to perform active range of motion for the first time. Active and Passive ROM continue to be limited and painful at this time.  Patient's condition has the potential to improve in response to therapy. Maximum improvement is yet to be obtained. The anticipated improvement is attainable and reasonable in a generally predictable time. Pt will benefit from skilled PT intervetion to  address these impairments in order to restore functional use, tolerance, and independence of LUE in ADL/IADL and leisure actiivty, as well as to improve safety at home and decrease caregiver burden.    Personal Factors and Comorbidities Age;Comorbidity 1    Comorbidities HTN, Hx CVA    Examination-Activity Limitations Stand;Bathing;Bed Mobility;Reach Overhead;Carry;Sleep    Examination-Participation Restrictions Driving;Yard Work;Meal Prep    Stability/Clinical Decision Making Stable/Uncomplicated    Rehab Potential Good    PT Frequency 2x / week    PT Duration 12 weeks    PT Treatment/Interventions Patient/family education;Balance training;Neuromuscular re-education;Therapeutic exercise;Stair training;Moist Heat;Functional mobility training;Therapeutic activities;Dry needling;Passive range of motion    PT Next Visit Plan Review HEP, continue with P/ROM, begin scapular strength training    PT Home Exercise Plan *see note    Consulted and Agree with Plan of Care Patient;Family member/caregiver    Family Member Consulted wife           Patient will benefit from skilled therapeutic intervention in order to improve the following deficits and impairments:  Decreased activity tolerance,Decreased strength,Difficulty walking,Decreased balance,Decreased coordination,Decreased mobility,Decreased endurance  Visit Diagnosis: Stiffness of left shoulder, not elsewhere classified  Hemiparesis affecting left side as late effect of stroke ([place])  Muscle weakness (generalized)     Problem List Patient Active Problem List   Diagnosis Date Noted  . History of stroke with residual effects 11/17/2019  . Right shoulder pain 10/25/2019  . Weakness 10/25/2019  . Decreased GFR 08/15/2019  . Dyslipidemia 08/15/2019  . AKI (acute kidney injury) ([place]) 08/15/2019  . Hemiparesis affecting left side as late effect of stroke ([place]) 08/15/2019  . Carotid stenosis 07/29/2019  . Carotid stenosis, right  07/29/2019  . Carotid artery  plaque, right 07/19/2019  . Hx of completed stroke 07/19/2019  . Stroke ([place]) 07/06/2019  . TIA (transient ischemic attack) 07/05/2019  . Hypokalemia 07/05/2019  . GERD without esophagitis 06/18/2017  . Essential hypertension 08/19/2016  . Vertigo 12/05/2015  . Adenomatous colon polyp 01/31/2014  . Basal cell carcinoma 09/23/2013  . Hay fever 09/23/2013  . Vitamin D deficiency 09/23/2013  . Gastric catarrh 11/28/2011  . Hyperlipidemia, mixed 11/28/2011  . Preop cardiovascular exam 11/28/2011   Janna Arch, PT, DPT   03/05/2020, 12:50 PM  [place] MAIN [facility] SERVICES [address] [place], [place], [address] Phone: [phone]   Fax:  [phone]  Name: Russell Herman MRN: [id number] Date of Birth: 12-Nov-1945

## 2020-03-07 ENCOUNTER — Other Ambulatory Visit: Payer: Self-pay

## 2020-03-07 ENCOUNTER — Ambulatory Visit: Payer: Medicare Other | Attending: Orthopedic Surgery

## 2020-03-07 DIAGNOSIS — M6281 Muscle weakness (generalized): Secondary | ICD-10-CM | POA: Diagnosis present

## 2020-03-07 DIAGNOSIS — I69354 Hemiplegia and hemiparesis following cerebral infarction affecting left non-dominant side: Secondary | ICD-10-CM | POA: Diagnosis present

## 2020-03-07 DIAGNOSIS — M25612 Stiffness of left shoulder, not elsewhere classified: Secondary | ICD-10-CM | POA: Diagnosis not present

## 2020-03-07 DIAGNOSIS — R278 Other lack of coordination: Secondary | ICD-10-CM | POA: Diagnosis present

## 2020-03-07 NOTE — Therapy (Signed)
[place] MAIN [facility] SERVICES [address] [place], [place], [address] Phone: [phone]   Fax:  [phone]  Physical Therapy Treatment  Patient Details  Name: Russell Herman MRN: [id number] Date of Birth: 10-Feb-1945 Referring Provider (PT): Leim Fabry MD (Orthopedics)   Encounter Date: 03/07/2020   PT End of Session - 03/07/20 1104    Visit Number 11    Number of Visits 25    Date for PT Re-Evaluation 04/06/20    Authorization Type Medicare; next session 1/10 PN 03/05/20    Authorization Time Period 01/13/20-04/06/20    PT Start Time 1100    PT Stop Time 1143    PT Time Calculation (min) 43 min    Equipment Utilized During Treatment Gait belt    Activity Tolerance Patient tolerated treatment well;No increased pain    Behavior During Therapy WFL for tasks assessed/performed           Past Medical History:  Diagnosis Date  . Adenomatous colon polyp   . Allergy   . Arthritis   . Cancer ([place])    basal cell skin  . Carotid stenosis   . Coronary artery disease   . GERD (gastroesophageal reflux disease)   . HLD (hyperlipidemia)   . Hypertension   . Pre-diabetes   . Stroke ([place])   . Wears hearing aid in both ears     Past Surgical History:  Procedure Laterality Date  . cyst removed    . ENDARTERECTOMY Right 08/04/2019   Procedure: ENDARTERECTOMY CAROTID;  Surgeon: Algernon Huxley, MD;  Location: [facility];  Service: Vascular;  Laterality: Right;  . EXCISION OF TONGUE LESION Bilateral 05/05/2019   Procedure: EXCISION OF DORSAL TONGUE LESION;  Surgeon: Margaretha Sheffield, MD;  Location: [place];  Service: ENT;  Laterality: Bilateral;  . SHOULDER ARTHROSCOPY WITH ROTATOR CUFF REPAIR AND SUBACROMIAL DECOMPRESSION Left 01/09/2020   Procedure: Left shoulder arthroscopic  rotator cuff repair with Regeneten patch, subacromial decompression, and biceps tenodesis;  Surgeon: Leim Fabry, MD;  Location: [facility];  Service: Orthopedics;   Laterality: Left;  . skin cancer removed      There were no vitals filed for this visit.   Subjective Assessment - 03/07/20 1103    Subjective Patient reports pain is constant, hasn't been doing too much homework due to pain.    Pertinent History Right lateral thalamic infarct mild ataxia and mild hemiparesis. Patient went to rehab 07/07/19-07/15/19. Then he had surgery 7/1 for  s/p successful right CEA    How long can you sit comfortably? not related    How long can you stand comfortably? not related    How long can you walk comfortably? not related    Currently in Pain? Yes    Pain Score 3     Pain Location Shoulder    Pain Orientation Left    Pain Descriptors / Indicators Aching    Pain Type Surgical pain    Pain Onset More than a month ago    Pain Frequency Constant                   Treatment:  SciFit; Lvl 3, 2 minutes forward, 2 minutes backwards, cueing for keeping elbow into side   Manual:  Supine:  Passive L shoulder ROM with multipoint holds for tension reduction: Flexion 15x 20 second holds, abduction 10x 20 second holds, ER with elbow against side due to protocol 10x 20 second holds Passive L elbow ROM  with elbow propped on pillow: flexion/extension 12x 20 second holds      Therex: cues for guided motion   Standing: -pendulum swing 10x each direction -wall walk 7x ; cues for neutral alignment of shoulder, hold 10 seconds  -hands on bar, step back for flexion assistance 8x 10 second holds   Supine: AAROM flexion 15x 3 second hold AAROM PNF with PT assistance D1 pattern 10x, D2 pattern 10x  Sidelying: ER AROM 15x with PT stabilization Abduction 15x with PT stabilization  Seated: -scapular retraction/row 15x -UE ranger flexion, scapular retraction with extension 10x -UE ranger circles: clockwise 10x, counterclockwise 10x        Patient performed with instruction, verbal cues, tactile cues of therapist: goal: increase tissue extensibility, promote  proper posture, improve mobility   vitals monitored throughout session     Patient is progressing with functional AAROM and AROM of surgical limb. His ROM continues to be limited requiring prolonged holds and max cueing for neutral alignment of elbow due to preference for internal rotation. Pt will benefit from skilled PT intervetion to address these impairments in order to restore functional use, tolerance, and independence of LUE in ADL/IADL and leisure actiivty, as well as to improve safety at home and decrease caregiver burden.            PT Education - 03/07/20 1104    Education Details exercise technique, manual,    Person(s) Educated Patient    Methods Explanation;Demonstration;Tactile cues;Verbal cues    Comprehension Verbalized understanding;Returned demonstration;Verbal cues required;Tactile cues required            PT Short Term Goals - 03/05/20 1247      PT SHORT TERM GOAL #1   Title After 2 weeks pt will demontrate improve P/ROM shoulder flexion 100 degrees FF as per protocol.    Baseline 85 degrees 1/31: 93 degrees    Time 2    Period Weeks    Status Partially Met    Target Date 03/19/20      PT SHORT TERM GOAL #2   Title After 2 weeks pt will demonstrate correct performance of HEP for P/ROM of shoulder, and AA/ROM of elbow, wrist, and hand.    Baseline Issued at evlauation 1/31: HEP progressed    Time 2    Period Weeks    Status Partially Met    Target Date 03/19/20             PT Long Term Goals - 03/05/20 1248      PT LONG TERM GOAL #1   Title After 8 weeks pt will demonstrate improved daily function AEB FOTO: >43.    Baseline 22 at evaluation. 1/31: 50%    Time 8    Period Weeks    Status Achieved      PT LONG TERM GOAL #2   Title After 8 weeks pt will demonstrate Left shoulder flexion >135 degrees; MMT flexion 4/5, ABDCT: 5/5, elbow flexion: 5/5.    Baseline not assessed at eval 1/31: flexion arom 91, MMT 2+/5    Time 8    Period Weeks     Status Partially Met    Target Date 03/09/20      PT LONG TERM GOAL #3   Title After 12 weeks pt will demonstrate improve Left shoulder ROM (ER in 90* ABDCT of>80 degrees), flexion >150 degrees; MMT: Flexion: 4+/5, ER: 5/5, IR: 5/5    Baseline not assessed at eval 1/31: flexion AROM 91  Time 12    Period Weeks    Status Partially Met    Target Date 04/06/20                 Plan - 03/07/20 1355    Clinical Impression Statement Patient is progressing with functional AAROM and AROM of surgical limb. His ROM continues to be limited requiring prolonged holds and max cueing for neutral alignment of elbow due to preference for internal rotation. Pt will benefit from skilled PT intervetion to address these impairments in order to restore functional use, tolerance, and independence of LUE in ADL/IADL and leisure actiivty, as well as to improve safety at home and decrease caregiver burden.    Personal Factors and Comorbidities Age;Comorbidity 1    Comorbidities HTN, Hx CVA    Examination-Activity Limitations Stand;Bathing;Bed Mobility;Reach Overhead;Carry;Sleep    Examination-Participation Restrictions Driving;Yard Work;Meal Prep    Stability/Clinical Decision Making Stable/Uncomplicated    Rehab Potential Good    PT Frequency 2x / week    PT Duration 12 weeks    PT Treatment/Interventions Patient/family education;Balance training;Neuromuscular re-education;Therapeutic exercise;Stair training;Moist Heat;Functional mobility training;Therapeutic activities;Dry needling;Passive range of motion    PT Next Visit Plan Review HEP, continue with P/ROM, begin scapular strength training    PT Home Exercise Plan *see note    Consulted and Agree with Plan of Care Patient;Family member/caregiver    Family Member Consulted wife           Patient will benefit from skilled therapeutic intervention in order to improve the following deficits and impairments:  Decreased activity tolerance,Decreased  strength,Difficulty walking,Decreased balance,Decreased coordination,Decreased mobility,Decreased endurance  Visit Diagnosis: Stiffness of left shoulder, not elsewhere classified  Hemiparesis affecting left side as late effect of stroke ([place])  Muscle weakness (generalized)     Problem List Patient Active Problem List   Diagnosis Date Noted  . History of stroke with residual effects 11/17/2019  . Right shoulder pain 10/25/2019  . Weakness 10/25/2019  . Decreased GFR 08/15/2019  . Dyslipidemia 08/15/2019  . AKI (acute kidney injury) ([place]) 08/15/2019  . Hemiparesis affecting left side as late effect of stroke ([place]) 08/15/2019  . Carotid stenosis 07/29/2019  . Carotid stenosis, right 07/29/2019  . Carotid artery plaque, right 07/19/2019  . Hx of completed stroke 07/19/2019  . Stroke ([place]) 07/06/2019  . TIA (transient ischemic attack) 07/05/2019  . Hypokalemia 07/05/2019  . GERD without esophagitis 06/18/2017  . Essential hypertension 08/19/2016  . Vertigo 12/05/2015  . Adenomatous colon polyp 01/31/2014  . Basal cell carcinoma 09/23/2013  . Hay fever 09/23/2013  . Vitamin D deficiency 09/23/2013  . Gastric catarrh 11/28/2011  . Hyperlipidemia, mixed 11/28/2011  . Preop cardiovascular exam 11/28/2011   Janna Arch, PT, DPT   03/07/2020, 1:56 PM  [place] MAIN [facility] SERVICES [address] [place], [place], [address] Phone: [phone]   Fax:  [phone]  Name: Russell Herman MRN: [id number] Date of Birth: Jun 15, 1945

## 2020-03-12 ENCOUNTER — Ambulatory Visit: Payer: Medicare Other

## 2020-03-12 ENCOUNTER — Other Ambulatory Visit: Payer: Self-pay

## 2020-03-12 DIAGNOSIS — M6281 Muscle weakness (generalized): Secondary | ICD-10-CM

## 2020-03-12 DIAGNOSIS — I69354 Hemiplegia and hemiparesis following cerebral infarction affecting left non-dominant side: Secondary | ICD-10-CM

## 2020-03-12 DIAGNOSIS — M25612 Stiffness of left shoulder, not elsewhere classified: Secondary | ICD-10-CM | POA: Diagnosis not present

## 2020-03-12 NOTE — Therapy (Signed)
[name] [facility] SERVICES [address] [place], [place], [address] Phone: [phone]   Fax:  [phone]  Physical Therapy Treatment  Patient Details  Name: Russell Herman MRN: [id number] Date of Birth: [birth year] Referring Provider (PT): Leim Fabry MD (Orthopedics)   Encounter Date: 03/12/2020   PT End of Session - 03/12/20 1104    Visit Number 12    Number of Visits 25    Date for PT Re-Evaluation 04/06/20    Authorization Type Medicare; next session 2/10 PN 03/05/20    Authorization Time Period 01/13/20-04/06/20    PT Start Time 1100    PT Stop Time 1144    PT Time Calculation (min) 44 min    Equipment Utilized During Treatment Gait belt    Activity Tolerance Patient tolerated treatment well;No increased pain    Behavior During Therapy WFL for tasks assessed/performed           Past Medical History:  Diagnosis Date  . Adenomatous colon polyp   . Allergy   . Arthritis   . Cancer ([place])    basal cell skin  . Carotid stenosis   . Coronary artery disease   . GERD (gastroesophageal reflux disease)   . HLD (hyperlipidemia)   . Hypertension   . Pre-diabetes   . Stroke ([place])   . Wears hearing aid in both ears     Past Surgical History:  Procedure Laterality Date  . cyst removed    . ENDARTERECTOMY Right 08/04/2019   Procedure: ENDARTERECTOMY CAROTID;  Surgeon: Algernon Huxley, MD;  Location: [facility];  Service: Vascular;  Laterality: Right;  . EXCISION OF TONGUE LESION Bilateral 05/05/2019   Procedure: EXCISION OF DORSAL TONGUE LESION;  Surgeon: Margaretha Sheffield, MD;  Location: [place];  Service: ENT;  Laterality: Bilateral;  . SHOULDER ARTHROSCOPY WITH ROTATOR CUFF REPAIR AND SUBACROMIAL DECOMPRESSION Left 01/09/2020   Procedure: Left shoulder arthroscopic  rotator cuff repair with Regeneten patch, subacromial decompression, and biceps tenodesis;  Surgeon: Leim Fabry, MD;  Location: [facility];  Service: Orthopedics;   Laterality: Left;  . skin cancer removed      There were no vitals filed for this visit.   Subjective Assessment - 03/12/20 1102    Subjective Patient reports he is exercising every day but doesn't know if its enough each day. Feels stiff today.    Pertinent History Right lateral thalamic infarct mild ataxia and mild hemiparesis. Patient went to rehab 07/07/19-07/15/19. Then he had surgery 7/1 for  s/p successful right CEA    How long can you sit comfortably? not related    How long can you stand comfortably? not related    How long can you walk comfortably? not related    Currently in Pain? Yes    Pain Score 2     Pain Location Shoulder    Pain Orientation Left    Pain Descriptors / Indicators Aching    Pain Type Surgical pain    Pain Onset More than a month ago                Treatment:  SciFit; Lvl 3, 2 minutes forward, 2 minutes backwards, cueing for keeping elbow into side   Manual:  Supine:  Passive L shoulder ROM with multipoint holds for tension reduction: Flexion 15x 20 second holds, abduction 10x 20 second holds, ER with elbow against side due to protocol 10x 20 second holds Passive L elbow ROM with elbow propped on pillow:  flexion/extension 12x 20 second holds      Therex: cues for guided motion   Standing: -pendulum swing 10x each direction -wall walk 7x ; cues for neutral alignment of shoulder, hold 10 seconds      Supine: AAROM flexion 15x 3 second hold AAROM PNF with PT assistance D1 pattern 10x, D2 pattern 10x   Sidelying: ER AROM 15x with PT stabilization Abduction 15x with PT stabilization   Seated: -scapular retraction/row 15x -ER/IR AROM 15x with cues for stabilization to side  -UE ranger flexion, scapular retraction with extension 10x -UE ranger circles: clockwise 10x, counterclockwise 10x       Patient performed with instruction, verbal cues, tactile cues of therapist: goal: increase tissue extensibility, promote proper posture, improve  mobility   vitals monitored throughout session     Patient is improving with increased available ROM in active and passive ROM. He is tolerating increased progression against gravity as well however pain continues to be limiting with increasing >100 degrees.  Pt will benefit from skilled PT intervention to address these impairments in order to restore functional use, tolerance, and independence of LUE in ADL/IADL and leisure activity, as well as to improve safety at home and decrease caregiver burden.                       PT Education - 03/12/20 1104    Education Details exercise technique, body mechanics    Person(s) Educated Patient    Methods Explanation;Demonstration;Tactile cues;Verbal cues    Comprehension Verbalized understanding;Returned demonstration;Verbal cues required;Tactile cues required            PT Short Term Goals - 03/05/20 1247      PT SHORT TERM GOAL #1   Title After 2 weeks pt will demontrate improve P/ROM shoulder flexion 100 degrees FF as per protocol.    Baseline 85 degrees 1/31: 93 degrees    Time 2    Period Weeks    Status Partially Met    Target Date 03/19/20      PT SHORT TERM GOAL #2   Title After 2 weeks pt will demonstrate correct performance of HEP for P/ROM of shoulder, and AA/ROM of elbow, wrist, and hand.    Baseline Issued at evlauation 1/31: HEP progressed    Time 2    Period Weeks    Status Partially Met    Target Date 03/19/20             PT Long Term Goals - 03/05/20 1248      PT LONG TERM GOAL #1   Title After 8 weeks pt will demonstrate improved daily function AEB FOTO: >43.    Baseline 22 at evaluation. 1/31: 50%    Time 8    Period Weeks    Status Achieved      PT LONG TERM GOAL #2   Title After 8 weeks pt will demonstrate Left shoulder flexion >135 degrees; MMT flexion 4/5, ABDCT: 5/5, elbow flexion: 5/5.    Baseline not assessed at eval 1/31: flexion arom 91, MMT 2+/5    Time 8    Period Weeks     Status Partially Met    Target Date 03/09/20      PT LONG TERM GOAL #3   Title After 12 weeks pt will demonstrate improve Left shoulder ROM (ER in 90* ABDCT of>80 degrees), flexion >150 degrees; MMT: Flexion: 4+/5, ER: 5/5, IR: 5/5    Baseline not assessed at eval 1/31:  flexion AROM 91    Time 12    Period Weeks    Status Partially Met    Target Date 04/06/20                 Plan - 03/12/20 1311    Clinical Impression Statement Patient is improving with increased available ROM in active and passive ROM. He is tolerating increased progression against gravity as well however pain continues to be limiting with increasing >100 degrees.  Pt will benefit from skilled PT intervention to address these impairments in order to restore functional use, tolerance, and independence of LUE in ADL/IADL and leisure activity, as well as to improve safety at home and decrease caregiver burden.    Personal Factors and Comorbidities Age;Comorbidity 1    Comorbidities HTN, Hx CVA    Examination-Activity Limitations Stand;Bathing;Bed Mobility;Reach Overhead;Carry;Sleep    Examination-Participation Restrictions Driving;Yard Work;Meal Prep    Stability/Clinical Decision Making Stable/Uncomplicated    Rehab Potential Good    PT Frequency 2x / week    PT Duration 12 weeks    PT Treatment/Interventions Patient/family education;Balance training;Neuromuscular re-education;Therapeutic exercise;Stair training;Moist Heat;Functional mobility training;Therapeutic activities;Dry needling;Passive range of motion    PT Next Visit Plan Review HEP, continue with P/ROM, begin scapular strength training    PT Home Exercise Plan *see note    Consulted and Agree with Plan of Care Patient;Family member/caregiver    Family Member Consulted wife           Patient will benefit from skilled therapeutic intervention in order to improve the following deficits and impairments:  Decreased activity tolerance,Decreased  strength,Difficulty walking,Decreased balance,Decreased coordination,Decreased mobility,Decreased endurance  Visit Diagnosis: Stiffness of left shoulder, not elsewhere classified  Hemiparesis affecting left side as late effect of stroke ([place])  Muscle weakness (generalized)     Problem List Patient Active Problem List   Diagnosis Date Noted  . History of stroke with residual effects 11/17/2019  . Right shoulder pain 10/25/2019  . Weakness 10/25/2019  . Decreased GFR 08/15/2019  . Dyslipidemia 08/15/2019  . AKI (acute kidney injury) ([place]) 08/15/2019  . Hemiparesis affecting left side as late effect of stroke ([place]) 08/15/2019  . Carotid stenosis 07/29/2019  . Carotid stenosis, right 07/29/2019  . Carotid artery plaque, right 07/19/2019  . Hx of completed stroke 07/19/2019  . Stroke ([place]) 07/06/2019  . TIA (transient ischemic attack) 07/05/2019  . Hypokalemia 07/05/2019  . GERD without esophagitis 06/18/2017  . Essential hypertension 08/19/2016  . Vertigo 12/05/2015  . Adenomatous colon polyp 01/31/2014  . Basal cell carcinoma 09/23/2013  . Hay fever 09/23/2013  . Vitamin D deficiency 09/23/2013  . Gastric catarrh 11/28/2011  . Hyperlipidemia, mixed 11/28/2011  . Preop cardiovascular exam 11/28/2011   Janna Arch, PT, DPT   03/12/2020, 1:13 PM  [place] MAIN [facility] SERVICES [address] [place], [place], [address] Phone: [phone]   Fax:  [phone]  Name: KARRON ALVIZO MRN: [id number] Date of Birth: Apr 28, 1945

## 2020-03-14 ENCOUNTER — Ambulatory Visit: Payer: Medicare Other

## 2020-03-14 ENCOUNTER — Other Ambulatory Visit: Payer: Self-pay

## 2020-03-14 DIAGNOSIS — M6281 Muscle weakness (generalized): Secondary | ICD-10-CM

## 2020-03-14 DIAGNOSIS — M25612 Stiffness of left shoulder, not elsewhere classified: Secondary | ICD-10-CM | POA: Diagnosis not present

## 2020-03-14 DIAGNOSIS — I69354 Hemiplegia and hemiparesis following cerebral infarction affecting left non-dominant side: Secondary | ICD-10-CM

## 2020-03-14 DIAGNOSIS — R278 Other lack of coordination: Secondary | ICD-10-CM

## 2020-03-14 NOTE — Therapy (Signed)
[place] MAIN [facility] SERVICES [address] [place], [place], [address] Phone: [phone]   Fax:  [phone]  Physical Therapy Treatment  Patient Details  Name: Russell Herman MRN: [id number] Date of Birth: Feb 27, 1945 Referring Provider (PT): Leim Fabry MD (Orthopedics)   Encounter Date: 03/14/2020   PT End of Session - 03/14/20 1106    Visit Number 13    Number of Visits 25    Date for PT Re-Evaluation 04/06/20    Authorization Type Medicare; next session 3/10 PN 03/05/20    Authorization Time Period 01/13/20-04/06/20    PT Start Time 1101    PT Stop Time 1144    PT Time Calculation (min) 43 min    Equipment Utilized During Treatment Gait belt    Activity Tolerance Patient tolerated treatment well;No increased pain    Behavior During Therapy WFL for tasks assessed/performed           Past Medical History:  Diagnosis Date  . Adenomatous colon polyp   . Allergy   . Arthritis   . Cancer ([place])    basal cell skin  . Carotid stenosis   . Coronary artery disease   . GERD (gastroesophageal reflux disease)   . HLD (hyperlipidemia)   . Hypertension   . Pre-diabetes   . Stroke ([place])   . Wears hearing aid in both ears     Past Surgical History:  Procedure Laterality Date  . cyst removed    . ENDARTERECTOMY Right 08/04/2019   Procedure: ENDARTERECTOMY CAROTID;  Surgeon: Algernon Huxley, MD;  Location: [facility];  Service: Vascular;  Laterality: Right;  . EXCISION OF TONGUE LESION Bilateral 05/05/2019   Procedure: EXCISION OF DORSAL TONGUE LESION;  Surgeon: Margaretha Sheffield, MD;  Location: [facility];  Service: ENT;  Laterality: Bilateral;  . SHOULDER ARTHROSCOPY WITH ROTATOR CUFF REPAIR AND SUBACROMIAL DECOMPRESSION Left 01/09/2020   Procedure: Left shoulder arthroscopic  rotator cuff repair with Regeneten patch, subacromial decompression, and biceps tenodesis;  Surgeon: Leim Fabry, MD;  Location: [facility];  Service: Orthopedics;   Laterality: Left;  . skin cancer removed      There were no vitals filed for this visit.   Subjective Assessment - 03/14/20 1104    Subjective Patient reports he is feeling sore still but is doing better. Has been compliant with HEP.    Pertinent History Right lateral thalamic infarct mild ataxia and mild hemiparesis. Patient went to rehab 07/07/19-07/15/19. Then he had surgery 7/1 for  s/p successful right CEA    How long can you sit comfortably? not related    How long can you stand comfortably? not related    How long can you walk comfortably? not related    Currently in Pain? Yes    Pain Score 2     Pain Location Shoulder    Pain Orientation Left    Pain Descriptors / Indicators Aching    Pain Type Surgical pain    Pain Onset More than a month ago    Pain Frequency Constant                 Treatment:  SciFit; Lvl 4, 2 minutes forward, 2 minutes backwards, cueing for keeping elbow into side ; improved control noted this session compared to previous sessions.    Manual:  Supine:  Passive L shoulder ROM with multipoint holds for tension reduction: Flexion 15x 20 second holds, abduction 10x 20 second holds, ER with elbow against side  due to protocol 10x 20 second holds Passive L elbow ROM with elbow propped on pillow: flexion/extension 12x 20 second holds      Therex: cues for guided motion   Standing: -pendulum swing 10x each direction -wall walk 7x ; cues for neutral alignment of shoulder, hold 10 seconds       Supine: AAROM flexion 15x 3 second hold AAROM PNF with PT assistance D1 pattern 10x, D2 pattern 10x   Sidelying: ER AROM 15x with PT stabilization Abduction 15x with PT stabilization   Seated: -scapular retraction/row 15x -ER/IR AROM 15x with cues for stabilization to side  -UE ranger flexion, scapular retraction with extension 10x -UE ranger circles: clockwise 10x, counterclockwise 10x       Patient performed with instruction, verbal cues, tactile  cues of therapist: goal: increase tissue extensibility, promote proper posture, improve mobility    Patient demonstrates improved internalization of cueing for neutral alignment of affected limb. Active and Passive ROM continue to be limited and painful at this time.Pt will benefit from skilled PT intervention to address these impairments in order to restore functional use, tolerance, and independence of LUE in ADL/IADL and leisure activity, as well as to improve safety at home and decrease caregiver burden.                   PT Education - 03/14/20 1105    Education Details exercise technique, body mechanics    Person(s) Educated Patient    Methods Explanation;Demonstration;Tactile cues;Verbal cues    Comprehension Verbalized understanding;Returned demonstration;Verbal cues required;Tactile cues required            PT Short Term Goals - 03/05/20 1247      PT SHORT TERM GOAL #1   Title After 2 weeks pt will demontrate improve P/ROM shoulder flexion 100 degrees FF as per protocol.    Baseline 85 degrees 1/31: 93 degrees    Time 2    Period Weeks    Status Partially Met    Target Date 03/19/20      PT SHORT TERM GOAL #2   Title After 2 weeks pt will demonstrate correct performance of HEP for P/ROM of shoulder, and AA/ROM of elbow, wrist, and hand.    Baseline Issued at evlauation 1/31: HEP progressed    Time 2    Period Weeks    Status Partially Met    Target Date 03/19/20             PT Long Term Goals - 03/05/20 1248      PT LONG TERM GOAL #1   Title After 8 weeks pt will demonstrate improved daily function AEB FOTO: >43.    Baseline 22 at evaluation. 1/31: 50%    Time 8    Period Weeks    Status Achieved      PT LONG TERM GOAL #2   Title After 8 weeks pt will demonstrate Left shoulder flexion >135 degrees; MMT flexion 4/5, ABDCT: 5/5, elbow flexion: 5/5.    Baseline not assessed at eval 1/31: flexion arom 91, MMT 2+/5    Time 8    Period Weeks     Status Partially Met    Target Date 03/09/20      PT LONG TERM GOAL #3   Title After 12 weeks pt will demonstrate improve Left shoulder ROM (ER in 90* ABDCT of>80 degrees), flexion >150 degrees; MMT: Flexion: 4+/5, ER: 5/5, IR: 5/5    Baseline not assessed at eval 1/31: flexion AROM  91    Time 12    Period Weeks    Status Partially Met    Target Date 04/06/20                 Plan - 03/14/20 1256    Clinical Impression Statement Patient demonstrates improved internalization of cueing for neutral alignment of affected limb. Active and Passive ROM continue to be limited and painful at this time.Pt will benefit from skilled PT intervention to address these impairments in order to restore functional use, tolerance, and independence of LUE in ADL/IADL and leisure activity, as well as to improve safety at home and decrease caregiver burden.    Personal Factors and Comorbidities Age;Comorbidity 1    Comorbidities HTN, Hx CVA    Examination-Activity Limitations Stand;Bathing;Bed Mobility;Reach Overhead;Carry;Sleep    Examination-Participation Restrictions Driving;Yard Work;Meal Prep    Stability/Clinical Decision Making Stable/Uncomplicated    Rehab Potential Good    PT Frequency 2x / week    PT Duration 12 weeks    PT Treatment/Interventions Patient/family education;Balance training;Neuromuscular re-education;Therapeutic exercise;Stair training;Moist Heat;Functional mobility training;Therapeutic activities;Dry needling;Passive range of motion    PT Next Visit Plan Review HEP, continue with P/ROM, begin scapular strength training    PT Home Exercise Plan *see note    Consulted and Agree with Plan of Care Patient;Family member/caregiver    Family Member Consulted wife           Patient will benefit from skilled therapeutic intervention in order to improve the following deficits and impairments:  Decreased activity tolerance,Decreased strength,Difficulty walking,Decreased  balance,Decreased coordination,Decreased mobility,Decreased endurance  Visit Diagnosis: Stiffness of left shoulder, not elsewhere classified  Hemiparesis affecting left side as late effect of stroke (H[facility])  Muscle weakness (generalized)  Other lack of coordination     Problem List Patient Active Problem List   Diagnosis Date Noted  . History of stroke with residual effects 11/17/2019  . Right shoulder pain 10/25/2019  . Weakness 10/25/2019  . Decreased GFR 08/15/2019  . Dyslipidemia 08/15/2019  . AKI (acute kidney injury) ([place]) 08/15/2019  . Hemiparesis affecting left side as late effect of stroke ([place]) 08/15/2019  . Carotid stenosis 07/29/2019  . Carotid stenosis, right 07/29/2019  . Carotid artery plaque, right 07/19/2019  . Hx of completed stroke 07/19/2019  . Stroke ([place]) 07/06/2019  . TIA (transient ischemic attack) 07/05/2019  . Hypokalemia 07/05/2019  . GERD without esophagitis 06/18/2017  . Essential hypertension 08/19/2016  . Vertigo 12/05/2015  . Adenomatous colon polyp 01/31/2014  . Basal cell carcinoma 09/23/2013  . Hay fever 09/23/2013  . Vitamin D deficiency 09/23/2013  . Gastric catarrh 11/28/2011  . Hyperlipidemia, mixed 11/28/2011  . Preop cardiovascular exam 11/28/2011   Janna Arch, PT, DPT   03/14/2020, 12:57 PM  [place] MAIN [facility] SERVICES [address] [place], [place], [address] Phone: [phone]   Fax:  [phone]  Name: Russell Herman MRN: [id number] Date of Birth: 1945-07-11

## 2020-03-19 ENCOUNTER — Other Ambulatory Visit: Payer: Self-pay

## 2020-03-19 ENCOUNTER — Ambulatory Visit: Payer: Medicare Other

## 2020-03-19 DIAGNOSIS — I69354 Hemiplegia and hemiparesis following cerebral infarction affecting left non-dominant side: Secondary | ICD-10-CM

## 2020-03-19 DIAGNOSIS — M25612 Stiffness of left shoulder, not elsewhere classified: Secondary | ICD-10-CM | POA: Diagnosis not present

## 2020-03-19 DIAGNOSIS — M6281 Muscle weakness (generalized): Secondary | ICD-10-CM

## 2020-03-19 NOTE — Therapy (Signed)
[place] MAIN [facility] SERVICES [address] [place], [place], [address] Phone: [phone]   Fax:  [phone]  Physical Therapy Treatment  Patient Details  Name: Russell Herman MRN: [id number] Date of Birth: 07/11/1945 Referring Provider (PT): Leim Fabry MD (Orthopedics)   Encounter Date: 03/19/2020   PT End of Session - 03/19/20 1105    Visit Number 14    Number of Visits 25    Date for PT Re-Evaluation 04/06/20    Authorization Type 4/10 PN 03/05/20    Authorization Time Period 01/13/20-04/06/20    PT Start Time 1100    PT Stop Time 1143    PT Time Calculation (min) 43 min    Equipment Utilized During Treatment Gait belt    Activity Tolerance Patient tolerated treatment well;No increased pain    Behavior During Therapy WFL for tasks assessed/performed           Past Medical History:  Diagnosis Date  . Adenomatous colon polyp   . Allergy   . Arthritis   . Cancer ([place])    basal cell skin  . Carotid stenosis   . Coronary artery disease   . GERD (gastroesophageal reflux disease)   . HLD (hyperlipidemia)   . Hypertension   . Pre-diabetes   . Stroke ([place])   . Wears hearing aid in both ears     Past Surgical History:  Procedure Laterality Date  . cyst removed    . ENDARTERECTOMY Right 08/04/2019   Procedure: ENDARTERECTOMY CAROTID;  Surgeon: Algernon Huxley, MD;  Location: [facility];  Service: Vascular;  Laterality: Right;  . EXCISION OF TONGUE LESION Bilateral 05/05/2019   Procedure: EXCISION OF DORSAL TONGUE LESION;  Surgeon: Margaretha Sheffield, MD;  Location: [facility];  Service: ENT;  Laterality: Bilateral;  . SHOULDER ARTHROSCOPY WITH ROTATOR CUFF REPAIR AND SUBACROMIAL DECOMPRESSION Left 01/09/2020   Procedure: Left shoulder arthroscopic  rotator cuff repair with Regeneten patch, subacromial decompression, and biceps tenodesis;  Surgeon: Leim Fabry, MD;  Location: [facility];  Service: Orthopedics;  Laterality: Left;  . skin  cancer removed      There were no vitals filed for this visit.   Subjective Assessment - 03/19/20 1104    Subjective Patient reports his shoulder is still bothering him but continues to get better each session.    Pertinent History Right lateral thalamic infarct mild ataxia and mild hemiparesis. Patient went to rehab 07/07/19-07/15/19. Then he had surgery 7/1 for  s/p successful right CEA    How long can you sit comfortably? not related    How long can you stand comfortably? not related    How long can you walk comfortably? not related    Currently in Pain? Yes    Pain Score 2     Pain Location Shoulder    Pain Orientation Left    Pain Descriptors / Indicators Aching    Pain Type Surgical pain    Pain Onset More than a month ago               Treatment:  SciFit; Lvl 4, 2 minutes forward, 2 minutes backwards, cueing for keeping elbow into side ; improved control noted this session compared to previous sessions.    Manual:  Supine:  Passive L shoulder ROM with multipoint holds for tension reduction: Flexion 15x 20 second holds, abduction 10x 20 second holds, ER with elbow against side due to protocol 10x 20 second holds Passive L elbow ROM with  elbow propped on pillow: flexion/extension 12x 20 second holds      Therex: cues for guided motion   Standing: -wall walk 7x ; cues for neutral alignment of shoulder, hold 10 seconds       Supine: AAROM flexion 15x 3 second hold AROM flexion 10x 3 second holds AAROM PNF with PT assistance D1 pattern 10x, D2 pattern 10x   Sidelying: ER AROM 15x with PT stabilization Abduction 15x with PT stabilization   Seated: -scapular retraction/row 15x -ER/IR AROM 15x with cues for stabilization to side  -AROM flexion 15x  -ArOM abduction 15x -reach down and pick up cone x 3 cones and reach and give to PT.  -UE ranger flexion, scapular retraction with extension 10x -UE ranger circles: clockwise 10x, counterclockwise 10x       Patient  performed with instruction, verbal cues, tactile cues of therapist: goal: increase tissue extensibility, promote proper posture, improve mobility  Patient is improving with ability to perform AROM against gravity however as he fatigues compensatory mechanisms become more apparent. Patient continues to be challenged past 90 degrees in both passive and active ROM at this time.Pt will benefit from skilled PT intervention to address these impairments in order to restore functional use, tolerance, and independence of LUE in ADL/IADL and leisure activity, as well as to improve safety at home and decrease caregiver burden.                        PT Education - 03/19/20 1105    Education Details exercise technique, body mechanics, AROM, neutral alignment    Person(s) Educated Patient    Methods Explanation;Demonstration;Tactile cues;Verbal cues    Comprehension Verbalized understanding;Returned demonstration;Verbal cues required;Tactile cues required            PT Short Term Goals - 03/05/20 1247      PT SHORT TERM GOAL #1   Title After 2 weeks pt will demontrate improve P/ROM shoulder flexion 100 degrees FF as per protocol.    Baseline 85 degrees 1/31: 93 degrees    Time 2    Period Weeks    Status Partially Met    Target Date 03/19/20      PT SHORT TERM GOAL #2   Title After 2 weeks pt will demonstrate correct performance of HEP for P/ROM of shoulder, and AA/ROM of elbow, wrist, and hand.    Baseline Issued at evlauation 1/31: HEP progressed    Time 2    Period Weeks    Status Partially Met    Target Date 03/19/20             PT Long Term Goals - 03/05/20 1248      PT LONG TERM GOAL #1   Title After 8 weeks pt will demonstrate improved daily function AEB FOTO: >43.    Baseline 22 at evaluation. 1/31: 50%    Time 8    Period Weeks    Status Achieved      PT LONG TERM GOAL #2   Title After 8 weeks pt will demonstrate Left shoulder flexion >135 degrees; MMT  flexion 4/5, ABDCT: 5/5, elbow flexion: 5/5.    Baseline not assessed at eval 1/31: flexion arom 91, MMT 2+/5    Time 8    Period Weeks    Status Partially Met    Target Date 03/09/20      PT LONG TERM GOAL #3   Title After 12 weeks pt will demonstrate improve Left  shoulder ROM (ER in 90* ABDCT of>80 degrees), flexion >150 degrees; MMT: Flexion: 4+/5, ER: 5/5, IR: 5/5    Baseline not assessed at eval 1/31: flexion AROM 91    Time 12    Period Weeks    Status Partially Met    Target Date 04/06/20                 Plan - 03/19/20 1222    Clinical Impression Statement Patient is improving with ability to perform AROM against gravity however as he fatigues compensatory mechanisms become more apparent. Patient continues to be challenged past 90 degrees in both passive and active ROM at this time.Pt will benefit from skilled PT intervention to address these impairments in order to restore functional use, tolerance, and independence of LUE in ADL/IADL and leisure activity, as well as to improve safety at home and decrease caregiver burden.    Personal Factors and Comorbidities Age;Comorbidity 1    Comorbidities HTN, Hx CVA    Examination-Activity Limitations Stand;Bathing;Bed Mobility;Reach Overhead;Carry;Sleep    Examination-Participation Restrictions Driving;Yard Work;Meal Prep    Stability/Clinical Decision Making Stable/Uncomplicated    Rehab Potential Good    PT Frequency 2x / week    PT Duration 12 weeks    PT Treatment/Interventions Patient/family education;Balance training;Neuromuscular re-education;Therapeutic exercise;Stair training;Moist Heat;Functional mobility training;Therapeutic activities;Dry needling;Passive range of motion    PT Next Visit Plan Review HEP, continue with P/ROM, begin scapular strength training    PT Home Exercise Plan *see note    Consulted and Agree with Plan of Care Patient;Family member/caregiver    Family Member Consulted wife            Patient will benefit from skilled therapeutic intervention in order to improve the following deficits and impairments:  Decreased activity tolerance,Decreased strength,Difficulty walking,Decreased balance,Decreased coordination,Decreased mobility,Decreased endurance  Visit Diagnosis: Stiffness of left shoulder, not elsewhere classified  Hemiparesis affecting left side as late effect of stroke ([place])  Muscle weakness (generalized)     Problem List Patient Active Problem List   Diagnosis Date Noted  . History of stroke with residual effects 11/17/2019  . Right shoulder pain 10/25/2019  . Weakness 10/25/2019  . Decreased GFR 08/15/2019  . Dyslipidemia 08/15/2019  . AKI (acute kidney injury) ([place]) 08/15/2019  . Hemiparesis affecting left side as late effect of stroke ([place]) 08/15/2019  . Carotid stenosis 07/29/2019  . Carotid stenosis, right 07/29/2019  . Carotid artery plaque, right 07/19/2019  . Hx of completed stroke 07/19/2019  . Stroke ([place]) 07/06/2019  . TIA (transient ischemic attack) 07/05/2019  . Hypokalemia 07/05/2019  . GERD without esophagitis 06/18/2017  . Essential hypertension 08/19/2016  . Vertigo 12/05/2015  . Adenomatous colon polyp 01/31/2014  . Basal cell carcinoma 09/23/2013  . Hay fever 09/23/2013  . Vitamin D deficiency 09/23/2013  . Gastric catarrh 11/28/2011  . Hyperlipidemia, mixed 11/28/2011  . Preop cardiovascular exam 11/28/2011   Janna Arch, PT, DPT   03/19/2020, 12:25 PM  [place] MAIN [facility] SERVICES [address] [place], [place], [address] Phone: [phone]   Fax:  [phone]  Name: Russell Herman MRN: [id number] Date of Birth: [birth year]

## 2020-03-21 ENCOUNTER — Ambulatory Visit: Payer: Medicare Other

## 2020-03-26 ENCOUNTER — Other Ambulatory Visit: Payer: Self-pay

## 2020-03-26 ENCOUNTER — Ambulatory Visit: Payer: Medicare Other

## 2020-03-26 DIAGNOSIS — M6281 Muscle weakness (generalized): Secondary | ICD-10-CM

## 2020-03-26 DIAGNOSIS — M25612 Stiffness of left shoulder, not elsewhere classified: Secondary | ICD-10-CM | POA: Diagnosis not present

## 2020-03-26 NOTE — Therapy (Signed)
[place] MAIN [facility] SERVICES [address] [place], [place], [address] Phone: [phone]   Fax:  [phone]  Physical Therapy Treatment  Patient Details  Name: Russell Herman MRN: [id number] Date of Birth: Mar 13, 1945 Referring Provider (PT): Leim Fabry MD (Orthopedics)   Encounter Date: 03/26/2020   PT End of Session - 03/26/20 1106    Visit Number 15    Number of Visits 25    Date for PT Re-Evaluation 04/06/20    Authorization Type 5/10 PN 03/05/20    Authorization Time Period 01/13/20-04/06/20    PT Start Time 1101    PT Stop Time 1145    PT Time Calculation (min) 44 min    Equipment Utilized During Treatment Gait belt    Activity Tolerance Patient tolerated treatment well;No increased pain    Behavior During Therapy WFL for tasks assessed/performed           Past Medical History:  Diagnosis Date  . Adenomatous colon polyp   . Allergy   . Arthritis   . Cancer ([place])    basal cell skin  . Carotid stenosis   . Coronary artery disease   . GERD (gastroesophageal reflux disease)   . HLD (hyperlipidemia)   . Hypertension   . Pre-diabetes   . Stroke ([place])   . Wears hearing aid in both ears     Past Surgical History:  Procedure Laterality Date  . cyst removed    . ENDARTERECTOMY Right 08/04/2019   Procedure: ENDARTERECTOMY CAROTID;  Surgeon: Algernon Huxley, MD;  Location: [facility];  Service: Vascular;  Laterality: Right;  . EXCISION OF TONGUE LESION Bilateral 05/05/2019   Procedure: EXCISION OF DORSAL TONGUE LESION;  Surgeon: Margaretha Sheffield, MD;  Location: [place];  Service: ENT;  Laterality: Bilateral;  . SHOULDER ARTHROSCOPY WITH ROTATOR CUFF REPAIR AND SUBACROMIAL DECOMPRESSION Left 01/09/2020   Procedure: Left shoulder arthroscopic  rotator cuff repair with Regeneten patch, subacromial decompression, and biceps tenodesis;  Surgeon: Leim Fabry, MD;  Location: [facility];  Service: Orthopedics;  Laterality: Left;  . skin  cancer removed      There were no vitals filed for this visit.   Subjective Assessment - 03/26/20 1105    Subjective Patient reports his shoulder and abdomen continue to bother him. His bottom of his feet are burning. No falls or LOB,has been compliant with HEP.    Pertinent History Right lateral thalamic infarct mild ataxia and mild hemiparesis. Patient went to rehab 07/07/19-07/15/19. Then he had surgery 7/1 for  s/p successful right CEA    How long can you sit comfortably? not related    How long can you stand comfortably? not related    How long can you walk comfortably? not related    Currently in Pain? Yes    Pain Score 2     Pain Location Shoulder    Pain Orientation Left    Pain Descriptors / Indicators Aching    Pain Type Surgical pain    Pain Onset More than a month ago    Pain Frequency Constant                 Treatment:  SciFit; Lvl 4, 2 minutes forward, 2 minutes backwards, cueing for keeping elbow into side ; improved control noted this session compared to previous sessions.    Manual:  Supine:  Passive L shoulder ROM with multipoint holds for tension reduction: Flexion 15x 20 second holds, abduction 10x 20 second  holds, ER with elbow against side due to protocol 10x 20 second holds Passive L elbow ROM with elbow propped on pillow: flexion/extension 12x 20 second holds      Therex: cues for guided motion   Standing: -wall walk 7x ; cues for neutral alignment of shoulder, hold 10 seconds       Supine: AAROM flexion 15x 3 second hold AROM flexion 10x 3 second holds cues for thumb up in the air  AAROM PNF with PT assistance D1 pattern 10x, D2 pattern 10x  scapular punches modified LUE 15x ; very challenging for patient Stabilization intervention against pertubation 3x15 seconds  Sidelying: ER AROM 15x with PT stabilization for alignment Abduction 15x with PT stabilization for alignment    Seated: -scapular retraction/row 15x with RTB -ER/IR AROM 15x  with cues for stabilization to side  -AROM flexion 15x  -AROM abduction 15x -reach down and pick up cone x 5 cones and reach and give to PT. x2 trials       Patient performed with instruction, verbal cues, tactile cues of therapist: goal: increase tissue extensibility, promote proper posture, improve mobility   Patient is highly motivated throughout physical therapy session. Patient's tone continues to challenge alignment and functional ROM. He is able to reach past 90 in AAROM but limited in PROM and AROM at this time.Pt will benefit from skilled PT intervention to address these impairments in order to restore functional use, tolerance, and independence of LUE in ADL/IADL and leisure activity, as well as to improve safety at home and decrease caregiver burden.                      PT Education - 03/26/20 1106    Education Details exercise technique, body mechanics, AROM    Person(s) Educated Patient    Methods Explanation;Demonstration;Tactile cues;Verbal cues    Comprehension Verbalized understanding;Verbal cues required;Returned demonstration;Tactile cues required            PT Short Term Goals - 03/05/20 1247      PT SHORT TERM GOAL #1   Title After 2 weeks pt will demontrate improve P/ROM shoulder flexion 100 degrees FF as per protocol.    Baseline 85 degrees 1/31: 93 degrees    Time 2    Period Weeks    Status Partially Met    Target Date 03/19/20      PT SHORT TERM GOAL #2   Title After 2 weeks pt will demonstrate correct performance of HEP for P/ROM of shoulder, and AA/ROM of elbow, wrist, and hand.    Baseline Issued at evlauation 1/31: HEP progressed    Time 2    Period Weeks    Status Partially Met    Target Date 03/19/20             PT Long Term Goals - 03/05/20 1248      PT LONG TERM GOAL #1   Title After 8 weeks pt will demonstrate improved daily function AEB FOTO: >43.    Baseline 22 at evaluation. 1/31: 50%    Time 8    Period Weeks     Status Achieved      PT LONG TERM GOAL #2   Title After 8 weeks pt will demonstrate Left shoulder flexion >135 degrees; MMT flexion 4/5, ABDCT: 5/5, elbow flexion: 5/5.    Baseline not assessed at eval 1/31: flexion arom 91, MMT 2+/5    Time 8    Period Weeks  Status Partially Met    Target Date 03/09/20      PT LONG TERM GOAL #3   Title After 12 weeks pt will demonstrate improve Left shoulder ROM (ER in 90* ABDCT of>80 degrees), flexion >150 degrees; MMT: Flexion: 4+/5, ER: 5/5, IR: 5/5    Baseline not assessed at eval 1/31: flexion AROM 91    Time 12    Period Weeks    Status Partially Met    Target Date 04/06/20                 Plan - 03/26/20 1122    Clinical Impression Statement Patient is highly motivated throughout physical therapy session. Patient's tone continues to challenge alignment and functional ROM. He is able to reach past 90 in AAROM but limited in PROM and AROM at this time.Pt will benefit from skilled PT intervention to address these impairments in order to restore functional use, tolerance, and independence of LUE in ADL/IADL and leisure activity, as well as to improve safety at home and decrease caregiver burden.    Personal Factors and Comorbidities Age;Comorbidity 1    Comorbidities HTN, Hx CVA    Examination-Activity Limitations Stand;Bathing;Bed Mobility;Reach Overhead;Carry;Sleep    Examination-Participation Restrictions Driving;Yard Work;Meal Prep    Stability/Clinical Decision Making Stable/Uncomplicated    Rehab Potential Good    PT Frequency 2x / week    PT Duration 12 weeks    PT Treatment/Interventions Patient/family education;Balance training;Neuromuscular re-education;Therapeutic exercise;Stair training;Moist Heat;Functional mobility training;Therapeutic activities;Dry needling;Passive range of motion    PT Next Visit Plan Review HEP, continue with P/ROM, begin scapular strength training    PT Home Exercise Plan *see note    Consulted and  Agree with Plan of Care Patient;Family member/caregiver    Family Member Consulted wife           Patient will benefit from skilled therapeutic intervention in order to improve the following deficits and impairments:  Decreased activity tolerance,Decreased strength,Difficulty walking,Decreased balance,Decreased coordination,Decreased mobility,Decreased endurance  Visit Diagnosis: Stiffness of left shoulder, not elsewhere classified  Muscle weakness (generalized)     Problem List Patient Active Problem List   Diagnosis Date Noted  . History of stroke with residual effects 11/17/2019  . Right shoulder pain 10/25/2019  . Weakness 10/25/2019  . Decreased GFR 08/15/2019  . Dyslipidemia 08/15/2019  . AKI (acute kidney injury) ([place]) 08/15/2019  . Hemiparesis affecting left side as late effect of stroke ([place]) 08/15/2019  . Carotid stenosis 07/29/2019  . Carotid stenosis, right 07/29/2019  . Carotid artery plaque, right 07/19/2019  . Hx of completed stroke 07/19/2019  . Stroke ([place]) 07/06/2019  . TIA (transient ischemic attack) 07/05/2019  . Hypokalemia 07/05/2019  . GERD without esophagitis 06/18/2017  . Essential hypertension 08/19/2016  . Vertigo 12/05/2015  . Adenomatous colon polyp 01/31/2014  . Basal cell carcinoma 09/23/2013  . Hay fever 09/23/2013  . Vitamin D deficiency 09/23/2013  . Gastric catarrh 11/28/2011  . Hyperlipidemia, mixed 11/28/2011  . Preop cardiovascular exam 11/28/2011   Janna Arch, PT, DPT   03/26/2020, 11:45 AM  [place] MAIN [facility] SERVICES [address] [place], [place], [address] Phone: [phone]   Fax:  [phone]  Name: EMANUELL MORINA MRN: [id number] Date of Birth: December 05, 1945

## 2020-03-28 ENCOUNTER — Ambulatory Visit: Payer: Medicare Other

## 2020-03-28 ENCOUNTER — Other Ambulatory Visit: Payer: Self-pay

## 2020-03-28 DIAGNOSIS — M6281 Muscle weakness (generalized): Secondary | ICD-10-CM

## 2020-03-28 DIAGNOSIS — M25612 Stiffness of left shoulder, not elsewhere classified: Secondary | ICD-10-CM

## 2020-03-28 DIAGNOSIS — I69354 Hemiplegia and hemiparesis following cerebral infarction affecting left non-dominant side: Secondary | ICD-10-CM

## 2020-03-28 DIAGNOSIS — R278 Other lack of coordination: Secondary | ICD-10-CM

## 2020-03-28 NOTE — Therapy (Signed)
[place] MAIN [facility] SERVICES [address] [place], [place], [address] Phone: [phone]   Fax:  [phone]  Physical Therapy Treatment  Patient Details  Name: Russell Herman MRN: [id number] Date of Birth: [birth year] Referring Provider (PT): Leim Fabry MD (Orthopedics)   Encounter Date: 03/28/2020   PT End of Session - 03/28/20 1105    Visit Number 16    Number of Visits 25    Date for PT Re-Evaluation 04/06/20    Authorization Type 6/10 PN 03/05/20    Authorization Time Period 01/13/20-04/06/20    PT Start Time 1100    PT Stop Time 1144    PT Time Calculation (min) 44 min    Equipment Utilized During Treatment Gait belt    Activity Tolerance Patient tolerated treatment well;No increased pain    Behavior During Therapy WFL for tasks assessed/performed           Past Medical History:  Diagnosis Date  . Adenomatous colon polyp   . Allergy   . Arthritis   . Cancer ([place])    basal cell skin  . Carotid stenosis   . Coronary artery disease   . GERD (gastroesophageal reflux disease)   . HLD (hyperlipidemia)   . Hypertension   . Pre-diabetes   . Stroke ([place])   . Wears hearing aid in both ears     Past Surgical History:  Procedure Laterality Date  . cyst removed    . ENDARTERECTOMY Right 08/04/2019   Procedure: ENDARTERECTOMY CAROTID;  Surgeon: Algernon Huxley, MD;  Location: [facility];  Service: Vascular;  Laterality: Right;  . EXCISION OF TONGUE LESION Bilateral 05/05/2019   Procedure: EXCISION OF DORSAL TONGUE LESION;  Surgeon: Margaretha Sheffield, MD;  Location: [place];  Service: ENT;  Laterality: Bilateral;  . SHOULDER ARTHROSCOPY WITH ROTATOR CUFF REPAIR AND SUBACROMIAL DECOMPRESSION Left 01/09/2020   Procedure: Left shoulder arthroscopic  rotator cuff repair with Regeneten patch, subacromial decompression, and biceps tenodesis;  Surgeon: Leim Fabry, MD;  Location: [facility];  Service: Orthopedics;  Laterality: Left;  . skin  cancer removed      There were no vitals filed for this visit.   Subjective Assessment - 03/28/20 1104    Subjective Patient reports no falls or LOB, has been compliant with HEP. Feeling stiff with the weather.    Pertinent History Right lateral thalamic infarct mild ataxia and mild hemiparesis. Patient went to rehab 07/07/19-07/15/19. Then he had surgery 7/1 for  s/p successful right CEA    How long can you sit comfortably? not related    How long can you stand comfortably? not related    How long can you walk comfortably? not related    Currently in Pain? Yes    Pain Score 2     Pain Location Shoulder    Pain Orientation Left    Pain Descriptors / Indicators Aching    Pain Type Surgical pain    Pain Onset More than a month ago    Pain Frequency Constant               Treatment:  SciFit; Lvl 4, 2 minutes forward, 2 minutes backwards, cueing for keeping elbow into side ; improved control noted this session compared to previous sessions.    Manual:  Supine:  Passive L shoulder ROM with multipoint holds for tension reduction: Flexion 15x 20 second holds, abduction 10x 20 second holds, ER with elbow against side due to protocol 10x 20  second holds Passive L elbow ROM with elbow propped on pillow: flexion/extension 12x 20 second holds   seated: scapualr retraction and depression with overpressure 10x 5 second holds    Therex: cues for guided motion    Supine: AAROM flexion 15x 3 second hold AROM flexion 10x 3 second holds cues for thumb up in the air  AAROM PNF with PT assistance D1 pattern 10x, D2 pattern 10x  scapular punches modified LUE 15x ; very challenging for patient Stabilization intervention against pertubation 3x15 seconds  YTB ER 15x ; x 2 sets   Sidelying: ER AROM 15x with PT stabilization for alignment Abduction 15x with PT stabilization for alignment    Seated: -scapular retraction/row 15x with RTB -ER/IR AROM 15x with cues for stabilization to side   -AROM flexion 15x  -AROM abduction 15x -UBE reacher: flexion, extension min A for scapular retraction 10x, clockwise 10x, counterclockwise 10x        Patient performed with instruction, verbal cues, tactile cues of therapist: goal: increase tissue extensibility, promote proper posture, improve mobility   Measurement:  AROM supine: flexion 112 degrees with slight elbow flexion    Patient's ROM is improving however tone continues to be main limiting factor at this time. Progression from more passive increase to active is tolerated well as patient demonstrates improved range with AAROM than PROM at this time. AROM improving to 112 degrees supine. Pt will benefit from skilled PT intervention to address these impairments in order to restore functional use, tolerance, and independence of LUE in ADL/IADL and leisure activity, as well as to improve safety at home and decrease caregiver burden.                         PT Education - 03/28/20 1105    Education Details exercise technique, body mechanics, ROM    Person(s) Educated Patient    Methods Explanation;Demonstration;Tactile cues;Verbal cues    Comprehension Verbalized understanding;Returned demonstration;Verbal cues required;Tactile cues required            PT Short Term Goals - 03/05/20 1247      PT SHORT TERM GOAL #1   Title After 2 weeks pt will demontrate improve P/ROM shoulder flexion 100 degrees FF as per protocol.    Baseline 85 degrees 1/31: 93 degrees    Time 2    Period Weeks    Status Partially Met    Target Date 03/19/20      PT SHORT TERM GOAL #2   Title After 2 weeks pt will demonstrate correct performance of HEP for P/ROM of shoulder, and AA/ROM of elbow, wrist, and hand.    Baseline Issued at evlauation 1/31: HEP progressed    Time 2    Period Weeks    Status Partially Met    Target Date 03/19/20             PT Long Term Goals - 03/05/20 1248      PT LONG TERM GOAL #1   Title  After 8 weeks pt will demonstrate improved daily function AEB FOTO: >43.    Baseline 22 at evaluation. 1/31: 50%    Time 8    Period Weeks    Status Achieved      PT LONG TERM GOAL #2   Title After 8 weeks pt will demonstrate Left shoulder flexion >135 degrees; MMT flexion 4/5, ABDCT: 5/5, elbow flexion: 5/5.    Baseline not assessed at eval 1/31: flexion arom 91,  MMT 2+/5    Time 8    Period Weeks    Status Partially Met    Target Date 03/09/20      PT LONG TERM GOAL #3   Title After 12 weeks pt will demonstrate improve Left shoulder ROM (ER in 90* ABDCT of>80 degrees), flexion >150 degrees; MMT: Flexion: 4+/5, ER: 5/5, IR: 5/5    Baseline not assessed at eval 1/31: flexion AROM 91    Time 12    Period Weeks    Status Partially Met    Target Date 04/06/20                 Plan - 03/28/20 1115    Clinical Impression Statement Patient's ROM is improving however tone continues to be main limiting factor at this time. Progression from more passive increase to active is tolerated well as patient demonstrates improved range with AAROM than PROM at this time. AROM improving to 112 degrees supine. Pt will benefit from skilled PT intervention to address these impairments in order to restore functional use, tolerance, and independence of LUE in ADL/IADL and leisure activity, as well as to improve safety at home and decrease caregiver burden.    Personal Factors and Comorbidities Age;Comorbidity 1    Comorbidities HTN, Hx CVA    Examination-Activity Limitations Stand;Bathing;Bed Mobility;Reach Overhead;Carry;Sleep    Examination-Participation Restrictions Driving;Yard Work;Meal Prep    Stability/Clinical Decision Making Stable/Uncomplicated    Rehab Potential Good    PT Frequency 2x / week    PT Duration 12 weeks    PT Treatment/Interventions Patient/family education;Balance training;Neuromuscular re-education;Therapeutic exercise;Stair training;Moist Heat;Functional mobility  training;Therapeutic activities;Dry needling;Passive range of motion    PT Next Visit Plan Review HEP, continue with P/ROM, begin scapular strength training    PT Home Exercise Plan *see note    Consulted and Agree with Plan of Care Patient;Family member/caregiver    Family Member Consulted wife           Patient will benefit from skilled therapeutic intervention in order to improve the following deficits and impairments:  Decreased activity tolerance,Decreased strength,Difficulty walking,Decreased balance,Decreased coordination,Decreased mobility,Decreased endurance  Visit Diagnosis: Stiffness of left shoulder, not elsewhere classified  Muscle weakness (generalized)  Hemiparesis affecting left side as late effect of stroke [facility])  Other lack of coordination     Problem List Patient Active Problem List   Diagnosis Date Noted  . History of stroke with residual effects 11/17/2019  . Right shoulder pain 10/25/2019  . Weakness 10/25/2019  . Decreased GFR 08/15/2019  . Dyslipidemia 08/15/2019  . AKI (acute kidney injury) ([place]) 08/15/2019  . Hemiparesis affecting left side as late effect of stroke ([place]) 08/15/2019  . Carotid stenosis 07/29/2019  . Carotid stenosis, right 07/29/2019  . Carotid artery plaque, right 07/19/2019  . Hx of completed stroke 07/19/2019  . Stroke ([place]) 07/06/2019  . TIA (transient ischemic attack) 07/05/2019  . Hypokalemia 07/05/2019  . GERD without esophagitis 06/18/2017  . Essential hypertension 08/19/2016  . Vertigo 12/05/2015  . Adenomatous colon polyp 01/31/2014  . Basal cell carcinoma 09/23/2013  . Hay fever 09/23/2013  . Vitamin D deficiency 09/23/2013  . Gastric catarrh 11/28/2011  . Hyperlipidemia, mixed 11/28/2011  . Preop cardiovascular exam 11/28/2011   Janna Arch, PT, DPT   03/28/2020, 11:51 AM  [place] MAIN [facility] SERVICES [address] [place], [place], [address] Phone: [phone]    Fax:  [phone]  Name: BROC CASPERS MRN: [id number] Date of Birth:  09-26-1945

## 2020-04-02 ENCOUNTER — Other Ambulatory Visit: Payer: Self-pay

## 2020-04-02 ENCOUNTER — Ambulatory Visit: Payer: Medicare Other

## 2020-04-02 DIAGNOSIS — I69354 Hemiplegia and hemiparesis following cerebral infarction affecting left non-dominant side: Secondary | ICD-10-CM

## 2020-04-02 DIAGNOSIS — M25612 Stiffness of left shoulder, not elsewhere classified: Secondary | ICD-10-CM

## 2020-04-02 DIAGNOSIS — M6281 Muscle weakness (generalized): Secondary | ICD-10-CM

## 2020-04-02 NOTE — Therapy (Signed)
[place] MAIN [facility] SERVICES [address] [place], [place], [address] Phone: [phone]   Fax:  [phone]  Physical Therapy Treatment  Patient Details  Name: Russell Herman MRN: [id number] Date of Birth: 01/26/1946 Referring Provider (PT): Leim Fabry MD (Orthopedics)   Encounter Date: 04/02/2020   PT End of Session - 04/02/20 1107    Visit Number 17    Number of Visits 25    Date for PT Re-Evaluation 04/06/20    Authorization Type 7/10 PN 03/05/20    Authorization Time Period 01/13/20-04/06/20    PT Start Time 1101    PT Stop Time 1144    PT Time Calculation (min) 43 min    Equipment Utilized During Treatment Gait belt    Activity Tolerance Patient tolerated treatment well;No increased pain    Behavior During Therapy WFL for tasks assessed/performed           Past Medical History:  Diagnosis Date  . Adenomatous colon polyp   . Allergy   . Arthritis   . Cancer ([place])    basal cell skin  . Carotid stenosis   . Coronary artery disease   . GERD (gastroesophageal reflux disease)   . HLD (hyperlipidemia)   . Hypertension   . Pre-diabetes   . Stroke ([place])   . Wears hearing aid in both ears     Past Surgical History:  Procedure Laterality Date  . cyst removed    . ENDARTERECTOMY Right 08/04/2019   Procedure: ENDARTERECTOMY CAROTID;  Surgeon: Algernon Huxley, MD;  Location: [facility];  Service: Vascular;  Laterality: Right;  . EXCISION OF TONGUE LESION Bilateral 05/05/2019   Procedure: EXCISION OF DORSAL TONGUE LESION;  Surgeon: Margaretha Sheffield, MD;  Location: [place];  Service: ENT;  Laterality: Bilateral;  . SHOULDER ARTHROSCOPY WITH ROTATOR CUFF REPAIR AND SUBACROMIAL DECOMPRESSION Left 01/09/2020   Procedure: Left shoulder arthroscopic  rotator cuff repair with Regeneten patch, subacromial decompression, and biceps tenodesis;  Surgeon: Leim Fabry, MD;  Location: [facility];  Service: Orthopedics;  Laterality: Left;  . skin  cancer removed      There were no vitals filed for this visit.   Subjective Assessment - 04/02/20 1108    Subjective Patient reports compliance with HEP, reports pain in his side and shoulder. No falls or LOB    Pertinent History Right lateral thalamic infarct mild ataxia and mild hemiparesis. Patient went to rehab 07/07/19-07/15/19. Then he had surgery 7/1 for  s/p successful right CEA    How long can you sit comfortably? not related    How long can you stand comfortably? not related    How long can you walk comfortably? not related    Currently in Pain? Yes    Pain Score 2     Pain Location Shoulder    Pain Orientation Left    Pain Descriptors / Indicators Aching    Pain Type Surgical pain    Pain Onset More than a month ago    Pain Frequency Constant                Treatment:  SciFit; Lvl 4, 2 minutes forward, 2 minutes backwards, cueing for keeping elbow into side ; improved control noted this session compared to previous sessions.    Manual:  Supine:  Passive L shoulder ROM with multipoint holds for tension reduction: Flexion 15x 20 second holds, abduction 10x 20 second holds, ER with elbow against side due to protocol 10x  20 second holds Passive L elbow ROM with elbow propped on pillow: flexion/extension 12x 20 second holds   seated: scapular retraction and depression with overpressure 10x 5 second holds     Therex: cues for guided motion    Supine: AAROM flexion 15x 3 second hold AROM flexion 10x 3 second holds cues for thumb up in the air  AAROM PNF with PT assistance D1 pattern 10x, D2 pattern 10x  scapular punches modified LUE 15x ; very challenging for patient Stabilization intervention against pertubation holding RTB  3x15 seconds  YTB ER 15x ; x 2 sets     Sidelying: ER AROM 15x with PT stabilization for alignment Abduction 15x with PT stabilization for alignment    Seated: -scapular retraction/row 15x with RTB -ER/IR AROM 15x with cues for  stabilization to side  -AROM flexion 15x  -AROM abduction 15x Swiss ball flexion, diagonal flexion 10x each angle       Patient performed with instruction, verbal cues, tactile cues of therapist: goal: increase tissue extensibility, promote proper posture, improve mobility   Measurement:  AROM supine: flexion 112 degrees with slight elbow flexion       Patient educated on isometrics for HEP and demonstrated understanding.    Access Code: [email or web address] URL: [email or web address] Date: 04/02/2020 Prepared by: Janna Arch  Exercises Isometric Shoulder Flexion at Wall - 1 x daily - 7 x weekly - 2 sets - 10 reps - 5 hold Standing Isometric Shoulder Internal Rotation at Doorway - 1 x daily - 7 x weekly - 2 sets - 10 reps - 5 hold Isometric Shoulder External Rotation at Wall - 1 x daily - 7 x weekly - 2 sets - 10 reps - 5 hold Isometric Shoulder Abduction at Wall - 1 x daily - 7 x weekly - 2 sets - 10 reps - 5 hold Isometric Shoulder Extension at Wall - 1 x daily - 7 x weekly - 2 sets - 10 reps - 5 hold     Patient continues to progress with funcitonal strength and range of motion of surgical limb. Continued focus on reduction of tone and neutral alignment required throughout session for optimal results. Patient educated on isometric contractions for Hep and demonstrated understanding. Pt will benefit from skilled PT intervention to address these impairments in order to restore functional use, tolerance, and independence of LUE in ADL/IADL and leisure activity, as well as to improve safety at home and decrease caregiver burden.                   PT Education - 04/02/20 1108    Education Details exercise technique, body mechanics    Person(s) Educated Patient    Methods Explanation;Demonstration;Tactile cues;Verbal cues    Comprehension Verbalized understanding;Returned demonstration;Verbal cues required;Tactile cues required            PT Short Term Goals -  03/05/20 1247      PT SHORT TERM GOAL #1   Title After 2 weeks pt will demontrate improve P/ROM shoulder flexion 100 degrees FF as per protocol.    Baseline 85 degrees 1/31: 93 degrees    Time 2    Period Weeks    Status Partially Met    Target Date 03/19/20      PT SHORT TERM GOAL #2   Title After 2 weeks pt will demonstrate correct performance of HEP for P/ROM of shoulder, and AA/ROM of elbow, wrist, and hand.    Baseline Issued at evlauation 1/31: HEP progressed  Time 2    Period Weeks    Status Partially Met    Target Date 03/19/20             PT Long Term Goals - 03/05/20 1248      PT LONG TERM GOAL #1   Title After 8 weeks pt will demonstrate improved daily function AEB FOTO: >43.    Baseline 22 at evaluation. 1/31: 50%    Time 8    Period Weeks    Status Achieved      PT LONG TERM GOAL #2   Title After 8 weeks pt will demonstrate Left shoulder flexion >135 degrees; MMT flexion 4/5, ABDCT: 5/5, elbow flexion: 5/5.    Baseline not assessed at eval 1/31: flexion arom 91, MMT 2+/5    Time 8    Period Weeks    Status Partially Met    Target Date 03/09/20      PT LONG TERM GOAL #3   Title After 12 weeks pt will demonstrate improve Left shoulder ROM (ER in 90* ABDCT of>80 degrees), flexion >150 degrees; MMT: Flexion: 4+/5, ER: 5/5, IR: 5/5    Baseline not assessed at eval 1/31: flexion AROM 91    Time 12    Period Weeks    Status Partially Met    Target Date 04/06/20                 Plan - 04/02/20 1157    Clinical Impression Statement Patient continues to progress with funcitonal strength and range of motion of surgical limb. Continued focus on reduction of tone and neutral alignment required throughout session for optimal results. Patient educated on isometric contractions for Hep and demonstrated understanding. Pt will benefit from skilled PT intervention to address these impairments in order to restore functional use, tolerance, and independence of LUE  in ADL/IADL and leisure activity, as well as to improve safety at home and decrease caregiver burden.    Personal Factors and Comorbidities Age;Comorbidity 1    Comorbidities HTN, Hx CVA    Examination-Activity Limitations Stand;Bathing;Bed Mobility;Reach Overhead;Carry;Sleep    Examination-Participation Restrictions Driving;Yard Work;Meal Prep    Stability/Clinical Decision Making Stable/Uncomplicated    Rehab Potential Good    PT Frequency 2x / week    PT Duration 12 weeks    PT Treatment/Interventions Patient/family education;Balance training;Neuromuscular re-education;Therapeutic exercise;Stair training;Moist Heat;Functional mobility training;Therapeutic activities;Dry needling;Passive range of motion    PT Next Visit Plan Review HEP, continue with P/ROM, begin scapular strength training    PT Home Exercise Plan *see note    Consulted and Agree with Plan of Care Patient;Family member/caregiver    Family Member Consulted wife           Patient will benefit from skilled therapeutic intervention in order to improve the following deficits and impairments:  Decreased activity tolerance,Decreased strength,Difficulty walking,Decreased balance,Decreased coordination,Decreased mobility,Decreased endurance  Visit Diagnosis: Stiffness of left shoulder, not elsewhere classified  Muscle weakness (generalized)  Hemiparesis affecting left side as late effect of stroke [facility])     Problem List Patient Active Problem List   Diagnosis Date Noted  . History of stroke with residual effects 11/17/2019  . Right shoulder pain 10/25/2019  . Weakness 10/25/2019  . Decreased GFR 08/15/2019  . Dyslipidemia 08/15/2019  . AKI (acute kidney injury) ([place]) 08/15/2019  . Hemiparesis affecting left side as late effect of stroke ([place]) 08/15/2019  . Carotid stenosis 07/29/2019  . Carotid stenosis, right 07/29/2019  . Carotid artery plaque, right 07/19/2019  .  Hx of completed stroke 07/19/2019  . Stroke  ([place]) 07/06/2019  . TIA (transient ischemic attack) 07/05/2019  . Hypokalemia 07/05/2019  . GERD without esophagitis 06/18/2017  . Essential hypertension 08/19/2016  . Vertigo 12/05/2015  . Adenomatous colon polyp 01/31/2014  . Basal cell carcinoma 09/23/2013  . Hay fever 09/23/2013  . Vitamin D deficiency 09/23/2013  . Gastric catarrh 11/28/2011  . Hyperlipidemia, mixed 11/28/2011  . Preop cardiovascular exam 11/28/2011   Janna Arch, PT, DPT   04/02/2020, 11:58 AM  [place] MAIN [facility] SERVICES [address] [place], [place], [address] Phone: [phone]   Fax:  [phone]  Name: Russell Herman MRN: [id number] Date of Birth: [birth year]

## 2020-04-04 ENCOUNTER — Ambulatory Visit: Payer: Medicare Other

## 2020-04-05 ENCOUNTER — Other Ambulatory Visit: Payer: Self-pay

## 2020-04-05 ENCOUNTER — Ambulatory Visit: Payer: Medicare Other | Attending: Orthopedic Surgery

## 2020-04-05 DIAGNOSIS — R278 Other lack of coordination: Secondary | ICD-10-CM | POA: Insufficient documentation

## 2020-04-05 DIAGNOSIS — M6281 Muscle weakness (generalized): Secondary | ICD-10-CM | POA: Insufficient documentation

## 2020-04-05 DIAGNOSIS — M25612 Stiffness of left shoulder, not elsewhere classified: Secondary | ICD-10-CM | POA: Diagnosis not present

## 2020-04-05 DIAGNOSIS — I69354 Hemiplegia and hemiparesis following cerebral infarction affecting left non-dominant side: Secondary | ICD-10-CM | POA: Diagnosis present

## 2020-04-05 DIAGNOSIS — I6309 Cerebral infarction due to thrombosis of other precerebral artery: Secondary | ICD-10-CM | POA: Insufficient documentation

## 2020-04-05 NOTE — Therapy (Signed)
[place] MAIN [facility] SERVICES [address] [place], [place], [address] Phone: [phone]   Fax:  [phone]  Physical Therapy Treatment/RECERT  Patient Details  Name: Russell Herman MRN: [id number] Date of Birth: 1945-06-21 Referring Provider (PT): Leim Fabry MD (Orthopedics)   Encounter Date: 04/05/2020   PT End of Session - 04/05/20 1109    Visit Number 18    Number of Visits 34    Date for PT Re-Evaluation 05/31/20    Authorization Type 8/10 PN 03/05/20    Authorization Time Period 01/13/20-04/06/20    PT Start Time 1105    PT Stop Time 1145    PT Time Calculation (min) 40 min    Equipment Utilized During Treatment Gait belt    Activity Tolerance Patient tolerated treatment well;No increased pain    Behavior During Therapy WFL for tasks assessed/performed           Past Medical History:  Diagnosis Date  . Adenomatous colon polyp   . Allergy   . Arthritis   . Cancer ([place])    basal cell skin  . Carotid stenosis   . Coronary artery disease   . GERD (gastroesophageal reflux disease)   . HLD (hyperlipidemia)   . Hypertension   . Pre-diabetes   . Stroke ([place])   . Wears hearing aid in both ears     Past Surgical History:  Procedure Laterality Date  . cyst removed    . ENDARTERECTOMY Right 08/04/2019   Procedure: ENDARTERECTOMY CAROTID;  Surgeon: Algernon Huxley, MD;  Location: [facility];  Service: Vascular;  Laterality: Right;  . EXCISION OF TONGUE LESION Bilateral 05/05/2019   Procedure: EXCISION OF DORSAL TONGUE LESION;  Surgeon: Margaretha Sheffield, MD;  Location: [place];  Service: ENT;  Laterality: Bilateral;  . SHOULDER ARTHROSCOPY WITH ROTATOR CUFF REPAIR AND SUBACROMIAL DECOMPRESSION Left 01/09/2020   Procedure: Left shoulder arthroscopic  rotator cuff repair with Regeneten patch, subacromial decompression, and biceps tenodesis;  Surgeon: Leim Fabry, MD;  Location: [facility];  Service: Orthopedics;  Laterality: Left;  .  skin cancer removed      There were no vitals filed for this visit.   Subjective Assessment - 04/05/20 1111    Subjective Patient reports he feels slow improvement in his shoulder. No falls or LOB. Has been compliant with HEP.    Pertinent History Right lateral thalamic infarct mild ataxia and mild hemiparesis. Patient went to rehab 07/07/19-07/15/19. Then he had surgery 7/1 for  s/p successful right CEA    How long can you sit comfortably? not related    How long can you stand comfortably? not related    How long can you walk comfortably? not related    Currently in Pain? Yes    Pain Score 2     Pain Location Shoulder    Pain Orientation Left    Pain Descriptors / Indicators Aching    Pain Type Chronic pain    Pain Onset More than a month ago    Pain Frequency Constant                 Goals:  ROM Seated AROM: flexion 95 with scaption , abduction (scapular raise) 82 degrees  Supine AROM: flexion 112 degrees with elbow flexion, abduction 102 degrees  FOTO 58%  New goal: QuickDash: 40.9 %  Treatment:    Therex: cues for guided motion    Supine: AAROM flexion 15x 3 second hold AROM flexion 10x 3  second holds cues for thumb up in the air  AAROM PNF with PT assistance D1 pattern 10x, D2 pattern 10x  scapular punches modified LUE 15x ; very challenging for patient Stabilization intervention against pertubation holding RTB  3x15 seconds  YTB ER 15x ; x 2 sets     Sidelying: ER AROM 15x with PT stabilization for alignment Abduction 15x with PT stabilization for alignment    Seated: -scapular retraction/row 15x with RTB -ER/IR AROM 15x with cues for stabilization to side  -AROM flexion 15x  -AROM abduction 15x       Patient performed with instruction, verbal cues, tactile cues of therapist: goal: increase tissue extensibility, promote proper posture, improve mobility      Patient demonstrates excellent progress towards functional goals at this time. His  progress is slow but steady as his previous stroke complicates his ROM and tone. He remains highly motivated to have his surgical limb return to a functional range and strength. Strength not tested due to continued limitations of range at this time. New goal of QuickDash added. Pt will benefit from skilled PT intervention to address these impairments in order to restore functional use, tolerance, and independence of LUE in ADL/IADL and leisure activity, as well as to improve safety at home and decrease caregiver burden.                    PT Education - 04/05/20 1112    Education Details goals, POC,    Person(s) Educated Patient    Methods Explanation;Demonstration;Tactile cues;Verbal cues    Comprehension Verbalized understanding;Returned demonstration;Verbal cues required;Tactile cues required            PT Short Term Goals - 04/05/20 1314      PT SHORT TERM GOAL #1   Title After 2 weeks pt will demontrate improve P/ROM shoulder flexion 100 degrees FF as per protocol.    Baseline 85 degrees 1/31: 93 degrees 3/3: 112 degrees    Time 2    Period Weeks    Status Achieved    Target Date 03/19/20      PT SHORT TERM GOAL #2   Title After 2 weeks pt will demonstrate correct performance of HEP for P/ROM of shoulder, and AA/ROM of elbow, wrist, and hand.    Baseline Issued at evlauation 1/31: HEP progressed 3/3: HEP compliant    Time 2    Period Weeks    Status Partially Met    Target Date 04/19/20             PT Long Term Goals - 04/05/20 1122      PT LONG TERM GOAL #1   Title After 8 weeks pt will demonstrate improved daily function AEB FOTO: >43.    Baseline 22 at evaluation. 1/31: 50% 3/3 58%    Time 8    Period Weeks    Status Achieved      PT LONG TERM GOAL #2   Title After 8 weeks pt will demonstrate Left shoulder flexion >135 degrees; MMT flexion 4/5, ABDCT: 5/5, elbow flexion: 5/5.    Baseline not assessed at eval 1/31: flexion arom 91, MMT 2+/5 3/3:  supine AROM 112 degrees MMT 2+    Time 8    Period Weeks    Status Partially Met    Target Date 05/31/20      PT LONG TERM GOAL #3   Title Patient will decrease Quick DASH score by > 8 points (32%)  demonstrating reduced  self-reported upper extremity disability.    Baseline 3/3/: 40.9%    Time 8    Period Weeks    Status New    Target Date 05/31/20                 Plan - 04/05/20 1313    Clinical Impression Statement Patient demonstrates excellent progress towards functional goals at this time. His progress is slow but steady as his previous stroke complicates his ROM and tone. He remains highly motivated to have his surgical limb return to a functional range and strength. Strength not tested due to continued limitations of range at this time. New goal of QuickDash added. Pt will benefit from skilled PT intervention to address these impairments in order to restore functional use, tolerance, and independence of LUE in ADL/IADL and leisure activity, as well as to improve safety at home and decrease caregiver burden.    Personal Factors and Comorbidities Age;Comorbidity 1    Comorbidities HTN, Hx CVA    Examination-Activity Limitations Stand;Bathing;Bed Mobility;Reach Overhead;Carry;Sleep    Examination-Participation Restrictions Driving;Yard Work;Meal Prep    Stability/Clinical Decision Making Stable/Uncomplicated    Rehab Potential Good    PT Frequency 2x / week    PT Duration 8 weeks    PT Treatment/Interventions Patient/family education;Balance training;Neuromuscular re-education;Therapeutic exercise;Stair training;Moist Heat;Functional mobility training;Therapeutic activities;Dry needling;Passive range of motion    PT Next Visit Plan Review HEP, continue with P/ROM, begin scapular strength training    PT Home Exercise Plan *see note    Consulted and Agree with Plan of Care Patient;Family member/caregiver    Family Member Consulted wife           Patient will benefit from  skilled therapeutic intervention in order to improve the following deficits and impairments:  Decreased activity tolerance,Decreased strength,Difficulty walking,Decreased balance,Decreased coordination,Decreased mobility,Decreased endurance  Visit Diagnosis: Stiffness of left shoulder, not elsewhere classified  Muscle weakness (generalized)     Problem List Patient Active Problem List   Diagnosis Date Noted  . History of stroke with residual effects 11/17/2019  . Right shoulder pain 10/25/2019  . Weakness 10/25/2019  . Decreased GFR 08/15/2019  . Dyslipidemia 08/15/2019  . AKI (acute kidney injury) ([place]) 08/15/2019  . Hemiparesis affecting left side as late effect of stroke ([place]) 08/15/2019  . Carotid stenosis 07/29/2019  . Carotid stenosis, right 07/29/2019  . Carotid artery plaque, right 07/19/2019  . Hx of completed stroke 07/19/2019  . Stroke ([place]) 07/06/2019  . TIA (transient ischemic attack) 07/05/2019  . Hypokalemia 07/05/2019  . GERD without esophagitis 06/18/2017  . Essential hypertension 08/19/2016  . Vertigo 12/05/2015  . Adenomatous colon polyp 01/31/2014  . Basal cell carcinoma 09/23/2013  . Hay fever 09/23/2013  . Vitamin D deficiency 09/23/2013  . Gastric catarrh 11/28/2011  . Hyperlipidemia, mixed 11/28/2011  . Preop cardiovascular exam 11/28/2011   Janna Arch, PT, DPT   04/05/2020, 1:53 PM  [place] MAIN [facility] SERVICES [address] [place], [place], [address] Phone: [phone]   Fax:  [phone]  Name: Russell Herman MRN: [id number] Date of Birth: April 29, 1945

## 2020-04-09 ENCOUNTER — Ambulatory Visit: Payer: Medicare Other

## 2020-04-09 ENCOUNTER — Other Ambulatory Visit: Payer: Self-pay

## 2020-04-09 DIAGNOSIS — M6281 Muscle weakness (generalized): Secondary | ICD-10-CM

## 2020-04-09 DIAGNOSIS — M25612 Stiffness of left shoulder, not elsewhere classified: Secondary | ICD-10-CM

## 2020-04-09 NOTE — Therapy (Signed)
[place] MAIN [facility] SERVICES [address] [place], [place], [address] Phone: [phone]   Fax:  [phone]  Physical Therapy Treatment  Patient Details  Name: Russell Herman MRN: [id number] Date of Birth: Mar 09, 1945 Referring Provider (PT): Leim Fabry MD (Orthopedics)   Encounter Date: 04/09/2020   PT End of Session - 04/09/20 1106    Visit Number 19    Number of Visits 34    Date for PT Re-Evaluation 05/31/20    Authorization Type 9/10 PN 03/05/20    Authorization Time Period 01/13/20-04/06/20    PT Start Time 1100    PT Stop Time 1144    PT Time Calculation (min) 44 min    Equipment Utilized During Treatment Gait belt    Activity Tolerance Patient tolerated treatment well;No increased pain    Behavior During Therapy WFL for tasks assessed/performed           Past Medical History:  Diagnosis Date  . Adenomatous colon polyp   . Allergy   . Arthritis   . Cancer ([place])    basal cell skin  . Carotid stenosis   . Coronary artery disease   . GERD (gastroesophageal reflux disease)   . HLD (hyperlipidemia)   . Hypertension   . Pre-diabetes   . Stroke ([place])   . Wears hearing aid in both ears     Past Surgical History:  Procedure Laterality Date  . cyst removed    . ENDARTERECTOMY Right 08/04/2019   Procedure: ENDARTERECTOMY CAROTID;  Surgeon: Algernon Huxley, MD;  Location: [facility];  Service: Vascular;  Laterality: Right;  . EXCISION OF TONGUE LESION Bilateral 05/05/2019   Procedure: EXCISION OF DORSAL TONGUE LESION;  Surgeon: Margaretha Sheffield, MD;  Location: [place];  Service: ENT;  Laterality: Bilateral;  . SHOULDER ARTHROSCOPY WITH ROTATOR CUFF REPAIR AND SUBACROMIAL DECOMPRESSION Left 01/09/2020   Procedure: Left shoulder arthroscopic  rotator cuff repair with Regeneten patch, subacromial decompression, and biceps tenodesis;  Surgeon: Leim Fabry, MD;  Location: [facility];  Service: Orthopedics;  Laterality: Left;  . skin  cancer removed      There were no vitals filed for this visit.   Subjective Assessment - 04/09/20 1104    Subjective Patient reports his shoulder is slowly improving but still is painful. No falls or LOB since last session.    Pertinent History Right lateral thalamic infarct mild ataxia and mild hemiparesis. Patient went to rehab 07/07/19-07/15/19. Then he had surgery 7/1 for  s/p successful right CEA    How long can you sit comfortably? not related    How long can you stand comfortably? not related    How long can you walk comfortably? not related    Currently in Pain? Yes    Pain Score 2     Pain Location Shoulder    Pain Orientation Left    Pain Descriptors / Indicators Aching    Pain Type Chronic pain    Pain Onset More than a month ago    Pain Frequency Constant                Treatment:  SciFit; Lvl 4, 2 minutes forward, 2 minutes backwards, cueing for keeping elbow into side ; improved control noted this session compared to previous sessions.    Manual:  Supine:  Passive L shoulder ROM with multipoint holds for tension reduction: Flexion 15x 20 second holds, abduction 10x 20 second holds, ER with elbow against side due to  protocol 10x 20 second holds Passive L elbow ROM with elbow propped on pillow: flexion/extension 12x 20 second holds   seated: scapular retraction and depression with overpressure 10x 5 second holds     Therex: cues for guided motion    Supine: AAROM flexion 15x 3 second hold AROM flexion 10x 3 second holds cues for thumb up in the air ; 1lb dumbell flexion 10x  AAROM PNF with PT assistance D1 pattern 10x, D2 pattern 10x  scapular punches modified LUE 15x ; very challenging for patient Stabilization intervention against pertubation holding RTB  3x15 seconds      Sidelying: ER AROM 15x with PT stabilization for alignment 1lb dumbell Abduction 15x with PT stabilization for alignment    Seated: -scapular retraction/row 15x with RTB -ER/IR AROM   RTB 15x with cues for stabilization to side  -AROM flexion 15x  -AROM abduction 15x -UE ranger standing : flexion/extension 10x, abduction/adduction 10x, circle 10x      Patient performed with instruction, verbal cues, tactile cues of therapist: goal: increase tissue extensibility, promote proper posture, improve mobility   Patient is highly motivated and tolerated increased resistance without report of pain. Patient's tone does continue to limit end range motions. A more neutral alignment is becoming more intrinsic at this time. Pt will benefit from skilled PT intervention to address these impairments in order to restore functional use, tolerance, and independence of LUE in ADL/IADL and leisure activity, as well as to improve safety at home and decrease caregiver burden                         PT Education - 04/09/20 1106    Education Details exercise technique, body mechanics,    Person(s) Educated Patient    Methods Explanation;Demonstration;Tactile cues;Verbal cues    Comprehension Verbalized understanding;Returned demonstration;Verbal cues required;Tactile cues required            PT Short Term Goals - 04/05/20 1314      PT SHORT TERM GOAL #1   Title After 2 weeks pt will demontrate improve P/ROM shoulder flexion 100 degrees FF as per protocol.    Baseline 85 degrees 1/31: 93 degrees 3/3: 112 degrees    Time 2    Period Weeks    Status Achieved    Target Date 03/19/20      PT SHORT TERM GOAL #2   Title After 2 weeks pt will demonstrate correct performance of HEP for P/ROM of shoulder, and AA/ROM of elbow, wrist, and hand.    Baseline Issued at evlauation 1/31: HEP progressed 3/3: HEP compliant    Time 2    Period Weeks    Status Partially Met    Target Date 04/19/20             PT Long Term Goals - 04/05/20 1122      PT LONG TERM GOAL #1   Title After 8 weeks pt will demonstrate improved daily function AEB FOTO: >43.    Baseline 22 at  evaluation. 1/31: 50% 3/3 58%    Time 8    Period Weeks    Status Achieved      PT LONG TERM GOAL #2   Title After 8 weeks pt will demonstrate Left shoulder flexion >135 degrees; MMT flexion 4/5, ABDCT: 5/5, elbow flexion: 5/5.    Baseline not assessed at eval 1/31: flexion arom 91, MMT 2+/5 3/3: supine AROM 112 degrees MMT 2+    Time 8  Period Weeks    Status Partially Met    Target Date 05/31/20      PT LONG TERM GOAL #3   Title Patient will decrease Quick DASH score by > 8 points (32%)  demonstrating reduced self-reported upper extremity disability.    Baseline 3/3/: 40.9%    Time 8    Period Weeks    Status New    Target Date 05/31/20                 Plan - 04/09/20 1156    Clinical Impression Statement Patient is highly motivated and tolerated increased resistance without report of pain. Patient's tone does continue to limit end range motions. A more neutral alignment is becoming more intrinsic at this time. Pt will benefit from skilled PT intervention to address these impairments in order to restore functional use, tolerance, and independence of LUE in ADL/IADL and leisure activity, as well as to improve safety at home and decrease caregiver burden    Personal Factors and Comorbidities Age;Comorbidity 1    Comorbidities HTN, Hx CVA    Examination-Activity Limitations Stand;Bathing;Bed Mobility;Reach Overhead;Carry;Sleep    Examination-Participation Restrictions Driving;Yard Work;Meal Prep    Stability/Clinical Decision Making Stable/Uncomplicated    Rehab Potential Good    PT Frequency 2x / week    PT Duration 8 weeks    PT Treatment/Interventions Patient/family education;Balance training;Neuromuscular re-education;Therapeutic exercise;Stair training;Moist Heat;Functional mobility training;Therapeutic activities;Dry needling;Passive range of motion    PT Next Visit Plan Review HEP, continue with P/ROM, begin scapular strength training    PT Home Exercise Plan *see  note    Consulted and Agree with Plan of Care Patient;Family member/caregiver    Family Member Consulted wife           Patient will benefit from skilled therapeutic intervention in order to improve the following deficits and impairments:  Decreased activity tolerance,Decreased strength,Difficulty walking,Decreased balance,Decreased coordination,Decreased mobility,Decreased endurance  Visit Diagnosis: Stiffness of left shoulder, not elsewhere classified  Muscle weakness (generalized)     Problem List Patient Active Problem List   Diagnosis Date Noted  . History of stroke with residual effects 11/17/2019  . Right shoulder pain 10/25/2019  . Weakness 10/25/2019  . Decreased GFR 08/15/2019  . Dyslipidemia 08/15/2019  . AKI (acute kidney injury) ([place]) 08/15/2019  . Hemiparesis affecting left side as late effect of stroke ([place]) 08/15/2019  . Carotid stenosis 07/29/2019  . Carotid stenosis, right 07/29/2019  . Carotid artery plaque, right 07/19/2019  . Hx of completed stroke 07/19/2019  . Stroke ([place]) 07/06/2019  . TIA (transient ischemic attack) 07/05/2019  . Hypokalemia 07/05/2019  . GERD without esophagitis 06/18/2017  . Essential hypertension 08/19/2016  . Vertigo 12/05/2015  . Adenomatous colon polyp 01/31/2014  . Basal cell carcinoma 09/23/2013  . Hay fever 09/23/2013  . Vitamin D deficiency 09/23/2013  . Gastric catarrh 11/28/2011  . Hyperlipidemia, mixed 11/28/2011  . Preop cardiovascular exam 11/28/2011   Janna Arch, PT, DPT   04/09/2020, 11:58 AM  [place] MAIN [facility] SERVICES [address] [place], [place], [address] Phone: [phone]   Fax:  [phone]  Name: Russell Herman MRN: [id number] Date of Birth: 1945-07-26

## 2020-04-11 ENCOUNTER — Other Ambulatory Visit: Payer: Self-pay

## 2020-04-11 ENCOUNTER — Ambulatory Visit: Payer: Medicare Other

## 2020-04-11 DIAGNOSIS — M25612 Stiffness of left shoulder, not elsewhere classified: Secondary | ICD-10-CM

## 2020-04-11 DIAGNOSIS — M6281 Muscle weakness (generalized): Secondary | ICD-10-CM

## 2020-04-11 DIAGNOSIS — R278 Other lack of coordination: Secondary | ICD-10-CM

## 2020-04-11 DIAGNOSIS — I69354 Hemiplegia and hemiparesis following cerebral infarction affecting left non-dominant side: Secondary | ICD-10-CM

## 2020-04-11 NOTE — Therapy (Signed)
[place] MAIN [facility] SERVICES [address] [place], [place], [address] Phone: [phone]   Fax:  [phone]  Physical Therapy Treatment Physical Therapy Progress Note   Dates of reporting period  03/05/20   to   04/11/20  Patient Details  Name: Russell Herman MRN: [id number] Date of Birth: October 09, 1945 Referring Provider (PT): Leim Fabry MD (Orthopedics)   Encounter Date: 04/11/2020   PT End of Session - 04/11/20 1104    Visit Number 20    Number of Visits 34    Date for PT Re-Evaluation 05/31/20    Authorization Type 10/10 PN 03/05/20; next session  1/10 PN 04/11/20    Authorization Time Period 01/13/20-04/06/20    PT Start Time 1100    PT Stop Time 1144    PT Time Calculation (min) 44 min    Equipment Utilized During Treatment Gait belt    Activity Tolerance Patient tolerated treatment well;No increased pain    Behavior During Therapy WFL for tasks assessed/performed           Past Medical History:  Diagnosis Date  . Adenomatous colon polyp   . Allergy   . Arthritis   . Cancer ([place])    basal cell skin  . Carotid stenosis   . Coronary artery disease   . GERD (gastroesophageal reflux disease)   . HLD (hyperlipidemia)   . Hypertension   . Pre-diabetes   . Stroke ([place])   . Wears hearing aid in both ears     Past Surgical History:  Procedure Laterality Date  . cyst removed    . ENDARTERECTOMY Right 08/04/2019   Procedure: ENDARTERECTOMY CAROTID;  Surgeon: Algernon Huxley, MD;  Location: [facility];  Service: Vascular;  Laterality: Right;  . EXCISION OF TONGUE LESION Bilateral 05/05/2019   Procedure: EXCISION OF DORSAL TONGUE LESION;  Surgeon: Margaretha Sheffield, MD;  Location: [place];  Service: ENT;  Laterality: Bilateral;  . SHOULDER ARTHROSCOPY WITH ROTATOR CUFF REPAIR AND SUBACROMIAL DECOMPRESSION Left 01/09/2020   Procedure: Left shoulder arthroscopic  rotator cuff repair with Regeneten patch, subacromial decompression, and  biceps tenodesis;  Surgeon: Leim Fabry, MD;  Location: [facility];  Service: Orthopedics;  Laterality: Left;  . skin cancer removed      There were no vitals filed for this visit.   Subjective Assessment - 04/11/20 1102    Subjective Patient reports his shoulder is moving better but still uncomfortable.    Pertinent History Right lateral thalamic infarct mild ataxia and mild hemiparesis. Patient went to rehab 07/07/19-07/15/19. Then he had surgery 7/1 for  s/p successful right CEA    How long can you sit comfortably? not related    How long can you stand comfortably? not related    How long can you walk comfortably? not related    Currently in Pain? Yes    Pain Score 2     Pain Location Shoulder    Pain Orientation Left    Pain Descriptors / Indicators Aching    Pain Type Chronic pain    Pain Onset More than a month ago    Pain Frequency Constant                    Progness note- goals performed 04/05/20 please refer to this note for further details   Treatment:  SciFit; Lvl 4, 2 minutes forward, 2 minutes backwards, cueing for keeping elbow into side ; improved control noted this session compared to previous  sessions.    Manual:  Supine:  Passive L shoulder ROM with multipoint holds for tension reduction: Flexion 10x 20 second holds, Glenohumeral AP, PA mobilizations grade II, 4x 15 seconds for pain reduction.   seated: scapular retraction and depression with overpressure 10x 5 second holds     Therex: cues for guided motion    Supine: AAROM flexion 15x 3 second hold AROM flexion 10x 3 second holds cues for thumb up in the air ; 2lb dumbell flexion 10x  AROM PNF with PT assistance D1 pattern 10x, D2 pattern 10x  scapular punches modified LUE 15x ; very challenging for patient; 2lb dumbell  Stabilization intervention against pertubation holding RTB  3x15 seconds       Sidelying: ER AROM 15x with PT stabilization for alignment 2lb dumbell Abduction 15x with PT  stabilization for alignment    Seated: -scapular retraction/row 15x with RTB -ER/IR AROM  RTB 15x with cues for stabilization to side ; x 2 sets  -AROM flexion 15x  -AROM abduction 15x -UE ranger standing : flexion/extension 10x, abduction/adduction 10x, circle 10x      Patient performed with instruction, verbal cues, tactile cues of therapist: goal: increase tissue extensibility, promote proper posture, improve mobility    Patient's condition has the potential to improve in response to therapy. Maximum improvement is yet to be obtained. The anticipated improvement is attainable and reasonable in a generally predictable time.  Patient reports his ROM Is improving but continues to have pain.   Patient's goals performed 04/05/20 please refer to this note for further details. Patient's condition has the potential to improve in response to therapy. Maximum improvement is yet to be obtained. The anticipated improvement is attainable and reasonable in a generally predictable time.Pt will benefit from skilled PT intervention to address these impairments in order to restore functional use, tolerance, and independence of LUE in ADL/IADL and leisure activity, as well as to improve safety at home and decrease caregiver burden                   PT Education - 04/11/20 1103    Education Details exercise technique, body mechanics    Person(s) Educated Patient    Methods Explanation;Verbal cues;Tactile cues;Demonstration    Comprehension Verbalized understanding;Returned demonstration;Verbal cues required;Tactile cues required            PT Short Term Goals - 04/05/20 1314      PT SHORT TERM GOAL #1   Title After 2 weeks pt will demontrate improve P/ROM shoulder flexion 100 degrees FF as per protocol.    Baseline 85 degrees 1/31: 93 degrees 3/3: 112 degrees    Time 2    Period Weeks    Status Achieved    Target Date 03/19/20      PT SHORT TERM GOAL #2   Title After 2 weeks pt  will demonstrate correct performance of HEP for P/ROM of shoulder, and AA/ROM of elbow, wrist, and hand.    Baseline Issued at evlauation 1/31: HEP progressed 3/3: HEP compliant    Time 2    Period Weeks    Status Partially Met    Target Date 04/19/20             PT Long Term Goals - 04/05/20 1122      PT LONG TERM GOAL #1   Title After 8 weeks pt will demonstrate improved daily function AEB FOTO: >43.    Baseline 22 at evaluation. 1/31: 50% 3/3 58%  Time 8    Period Weeks    Status Achieved      PT LONG TERM GOAL #2   Title After 8 weeks pt will demonstrate Left shoulder flexion >135 degrees; MMT flexion 4/5, ABDCT: 5/5, elbow flexion: 5/5.    Baseline not assessed at eval 1/31: flexion arom 91, MMT 2+/5 3/3: supine AROM 112 degrees MMT 2+    Time 8    Period Weeks    Status Partially Met    Target Date 05/31/20      PT LONG TERM GOAL #3   Title Patient will decrease Quick DASH score by > 8 points (32%)  demonstrating reduced self-reported upper extremity disability.    Baseline 3/3/: 40.9%    Time 8    Period Weeks    Status New    Target Date 05/31/20                 Plan - 04/11/20 1249    Clinical Impression Statement Patient's goals performed 04/05/20 please refer to this note for further details. Patient's condition has the potential to improve in response to therapy. Maximum improvement is yet to be obtained. The anticipated improvement is attainable and reasonable in a generally predictable time.Pt will benefit from skilled PT intervention to address these impairments in order to restore functional use, tolerance, and independence of LUE in ADL/IADL and leisure activity, as well as to improve safety at home and decrease caregiver burden    Personal Factors and Comorbidities Age;Comorbidity 1    Comorbidities HTN, Hx CVA    Examination-Activity Limitations Stand;Bathing;Bed Mobility;Reach Overhead;Carry;Sleep    Examination-Participation Restrictions  Driving;Yard Work;Meal Prep    Stability/Clinical Decision Making Stable/Uncomplicated    Rehab Potential Good    PT Frequency 2x / week    PT Duration 8 weeks    PT Treatment/Interventions Patient/family education;Balance training;Neuromuscular re-education;Therapeutic exercise;Stair training;Moist Heat;Functional mobility training;Therapeutic activities;Dry needling;Passive range of motion    PT Next Visit Plan Review HEP, continue with P/ROM, begin scapular strength training    PT Home Exercise Plan *see note    Consulted and Agree with Plan of Care Patient;Family member/caregiver    Family Member Consulted wife           Patient will benefit from skilled therapeutic intervention in order to improve the following deficits and impairments:  Decreased activity tolerance,Decreased strength,Difficulty walking,Decreased balance,Decreased coordination,Decreased mobility,Decreased endurance  Visit Diagnosis: Stiffness of left shoulder, not elsewhere classified  Muscle weakness (generalized)  Hemiparesis affecting left side as late effect of stroke [employer])  Other lack of coordination     Problem List Patient Active Problem List   Diagnosis Date Noted  . History of stroke with residual effects 11/17/2019  . Right shoulder pain 10/25/2019  . Weakness 10/25/2019  . Decreased GFR 08/15/2019  . Dyslipidemia 08/15/2019  . AKI (acute kidney injury) ([place]) 08/15/2019  . Hemiparesis affecting left side as late effect of stroke ([place]) 08/15/2019  . Carotid stenosis 07/29/2019  . Carotid stenosis, right 07/29/2019  . Carotid artery plaque, right 07/19/2019  . Hx of completed stroke 07/19/2019  . Stroke ([place]) 07/06/2019  . TIA (transient ischemic attack) 07/05/2019  . Hypokalemia 07/05/2019  . GERD without esophagitis 06/18/2017  . Essential hypertension 08/19/2016  . Vertigo 12/05/2015  . Adenomatous colon polyp 01/31/2014  . Basal cell carcinoma 09/23/2013  . Hay fever 09/23/2013  .  Vitamin D deficiency 09/23/2013  . Gastric catarrh 11/28/2011  . Hyperlipidemia, mixed 11/28/2011  . Preop cardiovascular exam  11/28/2011   Janna Arch, PT, DPT   04/11/2020, 12:52 PM  [place] MAIN [facility] SERVICES [address] [place], [place], [address] Phone: [phone]   Fax:  [phone]  Name: Russell Herman MRN: [id number] Date of Birth: 03/09/1945

## 2020-04-16 ENCOUNTER — Ambulatory Visit: Payer: Medicare Other

## 2020-04-16 ENCOUNTER — Other Ambulatory Visit: Payer: Self-pay

## 2020-04-16 DIAGNOSIS — M25612 Stiffness of left shoulder, not elsewhere classified: Secondary | ICD-10-CM

## 2020-04-16 DIAGNOSIS — R278 Other lack of coordination: Secondary | ICD-10-CM

## 2020-04-16 DIAGNOSIS — I69354 Hemiplegia and hemiparesis following cerebral infarction affecting left non-dominant side: Secondary | ICD-10-CM

## 2020-04-16 DIAGNOSIS — M6281 Muscle weakness (generalized): Secondary | ICD-10-CM

## 2020-04-16 NOTE — Therapy (Signed)
[place] MAIN [facility] SERVICES [address] [place], [place], [address] Phone: [phone]   Fax:  [phone]  Physical Therapy Treatment  Patient Details  Name: Russell Herman MRN: [id number] Date of Birth: [birth year] Referring Provider (PT): Leim Fabry MD (Orthopedics)   Encounter Date: 04/16/2020   PT End of Session - 04/16/20 1122    Visit Number 21    Number of Visits 34    Date for PT Re-Evaluation 05/31/20    Authorization Type 1/10 PN 04/11/20    Authorization Time Period 01/13/20-04/06/20    PT Start Time 1115    PT Stop Time 1159    PT Time Calculation (min) 44 min    Equipment Utilized During Treatment Gait belt    Activity Tolerance Patient tolerated treatment well;No increased pain    Behavior During Therapy WFL for tasks assessed/performed           Past Medical History:  Diagnosis Date  . Adenomatous colon polyp   . Allergy   . Arthritis   . Cancer ([place])    basal cell skin  . Carotid stenosis   . Coronary artery disease   . GERD (gastroesophageal reflux disease)   . HLD (hyperlipidemia)   . Hypertension   . Pre-diabetes   . Stroke ([place])   . Wears hearing aid in both ears     Past Surgical History:  Procedure Laterality Date  . cyst removed    . ENDARTERECTOMY Right 08/04/2019   Procedure: ENDARTERECTOMY CAROTID;  Surgeon: Algernon Huxley, MD;  Location: [facility];  Service: Vascular;  Laterality: Right;  . EXCISION OF TONGUE LESION Bilateral 05/05/2019   Procedure: EXCISION OF DORSAL TONGUE LESION;  Surgeon: Margaretha Sheffield, MD;  Location: [place];  Service: ENT;  Laterality: Bilateral;  . SHOULDER ARTHROSCOPY WITH ROTATOR CUFF REPAIR AND SUBACROMIAL DECOMPRESSION Left 01/09/2020   Procedure: Left shoulder arthroscopic  rotator cuff repair with Regeneten patch, subacromial decompression, and biceps tenodesis;  Surgeon: Leim Fabry, MD;  Location: [facility];  Service: Orthopedics;  Laterality: Left;  . skin  cancer removed      There were no vitals filed for this visit.   Subjective Assessment - 04/16/20 1121    Subjective Patient reports feeling stiff from the colder weather. No falls or LOB since last session.    Pertinent History Right lateral thalamic infarct mild ataxia and mild hemiparesis. Patient went to rehab 07/07/19-07/15/19. Then he had surgery 7/1 for  s/p successful right CEA    How long can you sit comfortably? not related    How long can you stand comfortably? not related    How long can you walk comfortably? not related    Currently in Pain? Yes    Pain Score 2     Pain Location Shoulder    Pain Orientation Left    Pain Descriptors / Indicators Aching    Pain Type Chronic pain    Pain Onset More than a month ago    Pain Frequency Constant               Treatment:  SciFit; Lvl 4, 2 minutes forward, 2 minutes backwards, cueing for keeping elbow into side ; improved control noted this session compared to previous sessions.    Manual:  Supine:  Passive L shoulder ROM with multipoint holds for tension reduction: Flexion 10x 20 second holds, Glenohumeral AP, PA mobilizations grade II, 4x 15 seconds for pain reduction.   seated: scapular  retraction and depression with overpressure 10x 5 second holds     Therex: cues for guided motion    Supine: AAROM flexion 15x 3 second hold with 2lb bar AROM flexion 10x 3 second holds cues for thumb up in the air ; 2lb dumbbell flexion 10x  AROM PNF with PT assistance D1 pattern 10x, D2 pattern 10x  scapular punches modified LUE 15x ; very challenging for patient; 2lb dumbbell  Stabilization intervention against perturbation holding RTB  3x15 seconds       Sidelying: ER AROM 15x with PT stabilization for alignment 2lb dumbbell Abduction 15x with PT stabilization for alignment    Seated: -Aarom flexion with 2lb bar 10x, 4 second hold -2lb bar row 15x.  -scapular retraction/row 15x with RTB -ER/IR AROM  RTB 15x with cues for  stabilization to side ; x 2 sets  -AROM flexion 15x  -AROM abduction 15x -UE ranger standing : flexion/extension 10x, abduction/adduction 10x, circle 10x      Patient performed with instruction, verbal cues, tactile cues of therapist: goal: increase tissue extensibility, promote proper posture, improve mobility                         PT Education - 04/16/20 1122    Education Details exercise technique, body mechanics    Person(s) Educated Patient    Methods Explanation;Demonstration;Tactile cues;Verbal cues    Comprehension Verbalized understanding;Returned demonstration;Tactile cues required;Verbal cues required            PT Short Term Goals - 04/05/20 1314      PT SHORT TERM GOAL #1   Title After 2 weeks pt will demontrate improve P/ROM shoulder flexion 100 degrees FF as per protocol.    Baseline 85 degrees 1/31: 93 degrees 3/3: 112 degrees    Time 2    Period Weeks    Status Achieved    Target Date 03/19/20      PT SHORT TERM GOAL #2   Title After 2 weeks pt will demonstrate correct performance of HEP for P/ROM of shoulder, and AA/ROM of elbow, wrist, and hand.    Baseline Issued at evlauation 1/31: HEP progressed 3/3: HEP compliant    Time 2    Period Weeks    Status Partially Met    Target Date 04/19/20             PT Long Term Goals - 04/05/20 1122      PT LONG TERM GOAL #1   Title After 8 weeks pt will demonstrate improved daily function AEB FOTO: >43.    Baseline 22 at evaluation. 1/31: 50% 3/3 58%    Time 8    Period Weeks    Status Achieved      PT LONG TERM GOAL #2   Title After 8 weeks pt will demonstrate Left shoulder flexion >135 degrees; MMT flexion 4/5, ABDCT: 5/5, elbow flexion: 5/5.    Baseline not assessed at eval 1/31: flexion arom 91, MMT 2+/5 3/3: supine AROM 112 degrees MMT 2+    Time 8    Period Weeks    Status Partially Met    Target Date 05/31/20      PT LONG TERM GOAL #3   Title Patient will decrease Quick  DASH score by > 8 points (32%)  demonstrating reduced self-reported upper extremity disability.    Baseline 3/3/: 40.9%    Time 8    Period Weeks    Status New  Target Date 05/31/20                 Plan - 04/16/20 1130    Clinical Impression Statement Patient is highly motivated throughout session. He continues to be limited at end range with passive ROM more limited than active and active assisted. Tone continues to limit end range of motion at this time. Pt will benefit from skilled PT intervention to address these impairments in order to restore functional use, tolerance, and independence of LUE in ADL/IADL and leisure activity,    Personal Factors and Comorbidities Age;Comorbidity 1    Comorbidities HTN, Hx CVA    Examination-Activity Limitations Stand;Bathing;Bed Mobility;Reach Overhead;Carry;Sleep    Examination-Participation Restrictions Driving;Yard Work;Meal Prep    Stability/Clinical Decision Making Stable/Uncomplicated    Rehab Potential Good    PT Frequency 2x / week    PT Duration 8 weeks    PT Treatment/Interventions Patient/family education;Balance training;Neuromuscular re-education;Therapeutic exercise;Stair training;Moist Heat;Functional mobility training;Therapeutic activities;Dry needling;Passive range of motion    PT Next Visit Plan Review HEP, continue with P/ROM, begin scapular strength training    PT Home Exercise Plan *see note    Consulted and Agree with Plan of Care Patient;Family member/caregiver    Family Member Consulted wife           Patient will benefit from skilled therapeutic intervention in order to improve the following deficits and impairments:  Decreased activity tolerance,Decreased strength,Difficulty walking,Decreased balance,Decreased coordination,Decreased mobility,Decreased endurance  Visit Diagnosis: Stiffness of left shoulder, not elsewhere classified  Hemiparesis affecting left side as late effect of stroke (HCC)  Muscle  weakness (generalized)  Other lack of coordination     Problem List Patient Active Problem List   Diagnosis Date Noted  . History of stroke with residual effects 11/17/2019  . Right shoulder pain 10/25/2019  . Weakness 10/25/2019  . Decreased GFR 08/15/2019  . Dyslipidemia 08/15/2019  . AKI (acute kidney injury) ([place]) 08/15/2019  . Hemiparesis affecting left side as late effect of stroke ([place]) 08/15/2019  . Carotid stenosis 07/29/2019  . Carotid stenosis, right 07/29/2019  . Carotid artery plaque, right 07/19/2019  . Hx of completed stroke 07/19/2019  . Stroke ([place]) 07/06/2019  . TIA (transient ischemic attack) 07/05/2019  . Hypokalemia 07/05/2019  . GERD without esophagitis 06/18/2017  . Essential hypertension 08/19/2016  . Vertigo 12/05/2015  . Adenomatous colon polyp 01/31/2014  . Basal cell carcinoma 09/23/2013  . Hay fever 09/23/2013  . Vitamin D deficiency 09/23/2013  . Gastric catarrh 11/28/2011  . Hyperlipidemia, mixed 11/28/2011  . Preop cardiovascular exam 11/28/2011   Janna Arch, PT, DPT   04/16/2020, 12:06 PM  [place] MAIN [facility] SERVICES [address] [place], [place], [address] Phone: [phone]   Fax:  [phone]  Name: TRISHA MORANDI MRN: [id number] Date of Birth: 07/24/1945

## 2020-04-18 ENCOUNTER — Other Ambulatory Visit: Payer: Self-pay

## 2020-04-18 ENCOUNTER — Ambulatory Visit: Payer: Medicare Other

## 2020-04-18 DIAGNOSIS — M25612 Stiffness of left shoulder, not elsewhere classified: Secondary | ICD-10-CM | POA: Diagnosis not present

## 2020-04-18 NOTE — Therapy (Signed)
[place] MAIN [facility] SERVICES [address] [place], [place], [address] Phone: [phone]   Fax:  [phone]  Physical Therapy Treatment  Patient Details  Name: Russell Herman MRN: [id number] Date of Birth: [birth year] Referring Provider (PT): Leim Fabry MD (Orthopedics)   Encounter Date: 04/18/2020   PT End of Session - 04/18/20 1109    Visit Number 22    Number of Visits 34    Date for PT Re-Evaluation 05/31/20    Authorization Time Period 01/13/20-04/06/20; 04/05/20-05/31/20    PT Start Time 1104    PT Stop Time 1142    PT Time Calculation (min) 38 min    Equipment Utilized During Treatment Gait belt    Activity Tolerance Patient tolerated treatment well;No increased pain    Behavior During Therapy WFL for tasks assessed/performed           Past Medical History:  Diagnosis Date  . Adenomatous colon polyp   . Allergy   . Arthritis   . Cancer ([place])    basal cell skin  . Carotid stenosis   . Coronary artery disease   . GERD (gastroesophageal reflux disease)   . HLD (hyperlipidemia)   . Hypertension   . Pre-diabetes   . Stroke ([place])   . Wears hearing aid in both ears     Past Surgical History:  Procedure Laterality Date  . cyst removed    . ENDARTERECTOMY Right 08/04/2019   Procedure: ENDARTERECTOMY CAROTID;  Surgeon: Algernon Huxley, MD;  Location: [facility];  Service: Vascular;  Laterality: Right;  . EXCISION OF TONGUE LESION Bilateral 05/05/2019   Procedure: EXCISION OF DORSAL TONGUE LESION;  Surgeon: Margaretha Sheffield, MD;  Location: [place];  Service: ENT;  Laterality: Bilateral;  . SHOULDER ARTHROSCOPY WITH ROTATOR CUFF REPAIR AND SUBACROMIAL DECOMPRESSION Left 01/09/2020   Procedure: Left shoulder arthroscopic  rotator cuff repair with Regeneten patch, subacromial decompression, and biceps tenodesis;  Surgeon: Leim Fabry, MD;  Location: [facility];  Service: Orthopedics;  Laterality: Left;  . skin cancer removed       There were no vitals filed for this visit.   Subjective Assessment - 04/18/20 1108    Subjective Pt reports doing ok today. No updates since last session.    Pertinent History Right lateral thalamic infarct mild ataxia and mild hemiparesis. Patient went to rehab 07/07/19-07/15/19. Then he had surgery 7/1 for  s/p successful right CEA    Currently in Pain? Yes    Pain Score 2            INTERVENTION THIS DATE: -Left shoulder distraction in neutral (sliulght abduction) c towel loop at axilla 5x20sec -myofascial gentle release: +Left posterior deltoid x2-3 minutes +Left lateral deltoid x2 minutes +Left anterior deltoid x2 minutes  +Left pec minor x2 minutes  +Left pec major x3 minutes  +Infraspinatus x1 minute (most caudal portion)  -ART to Left shoulder (combined myofascial stretch with P/ROM +clavicular pec major with shoulder scaption +central pec major with shoulder flexion  +anterior deltoid with shoulder flexion   P/ROM:  -elbow extension + shoulder flexion 15x3secH  -shoulder scaption (long lever) 10x10sec c long axis distraction  -shoudler flexion with long axis distraction to produce concurrent scapular protraction 10x10secH  -External rotation 15x5secH at 80 degrees abduction (90 degrees at the elbow)     PT Short Term Goals - 04/05/20 1314      PT SHORT TERM GOAL #1   Title After 2 weeks pt will demontrate  improve P/ROM shoulder flexion 100 degrees FF as per protocol.    Baseline 85 degrees 1/31: 93 degrees 3/3: 112 degrees    Time 2    Period Weeks    Status Achieved    Target Date 03/19/20      PT SHORT TERM GOAL #2   Title After 2 weeks pt will demonstrate correct performance of HEP for P/ROM of shoulder, and AA/ROM of elbow, wrist, and hand.    Baseline Issued at evlauation 1/31: HEP progressed 3/3: HEP compliant    Time 2    Period Weeks    Status Partially Met    Target Date 04/19/20             PT Long Term Goals - 04/05/20 1122      PT LONG  TERM GOAL #1   Title After 8 weeks pt will demonstrate improved daily function AEB FOTO: >43.    Baseline 22 at evaluation. 1/31: 50% 3/3 58%    Time 8    Period Weeks    Status Achieved      PT LONG TERM GOAL #2   Title After 8 weeks pt will demonstrate Left shoulder flexion >135 degrees; MMT flexion 4/5, ABDCT: 5/5, elbow flexion: 5/5.    Baseline not assessed at eval 1/31: flexion arom 91, MMT 2+/5 3/3: supine AROM 112 degrees MMT 2+    Time 8    Period Weeks    Status Partially Met    Target Date 05/31/20      PT LONG TERM GOAL #3   Title Patient will decrease Quick DASH score by > 8 points (32%)  demonstrating reduced self-reported upper extremity disability.    Baseline 3/3/: 40.9%    Time 8    Period Weeks    Status New    Target Date 05/31/20                 Plan - 04/18/20 1143    Clinical Impression Statement Continued with current plan of care as laid out in evaluation and recent prior sessions. Pt remains motivated to advance progress toward goals. Heavy portion of session address myofascial tightness  restricting end-range ROM of shoulder. Pec major is the most tender of them all, requires much time for full release due to muscle mass. Unable to determine any muscular restrictions to GHJ IR, largely due to time restrictions. Rest breaks provided as needed to respect neurovascular limitations of manual therapy at the shoulder. Pt continues to demonstrate progress toward goals AEB progression of some interventions this date either in volume or intensity. No updates to HEP this date.    Personal Factors and Comorbidities Age    Comorbidities HTN, Hx CVA    Examination-Activity Limitations Stand;Bathing;Bed Mobility;Reach Overhead;Carry;Sleep    Examination-Participation Restrictions Driving;Yard Work;Meal Prep    Stability/Clinical Decision Making Stable/Uncomplicated    Clinical Decision Making Low    Rehab Potential Good    PT Frequency 2x / week    PT Duration 8  weeks    PT Treatment/Interventions Patient/family education;Balance training;Neuromuscular re-education;Therapeutic exercise;Stair training;Moist Heat;Functional mobility training;Therapeutic activities;Dry needling;Passive range of motion    PT Next Visit Plan Review HEP, continue with P/ROM, begin scapular strength training    PT Home Exercise Plan No updates today.    Consulted and Agree with Plan of Care Patient           Patient will benefit from skilled therapeutic intervention in order to improve the following deficits and impairments:  Decreased activity tolerance,Decreased strength,Difficulty walking,Decreased balance,Decreased coordination,Decreased mobility,Decreased endurance  Visit Diagnosis: Stiffness of left shoulder, not elsewhere classified     Problem List Patient Active Problem List   Diagnosis Date Noted  . History of stroke with residual effects 11/17/2019  . Right shoulder pain 10/25/2019  . Weakness 10/25/2019  . Decreased GFR 08/15/2019  . Dyslipidemia 08/15/2019  . AKI (acute kidney injury) ([place]) 08/15/2019  . Hemiparesis affecting left side as late effect of stroke ([place]) 08/15/2019  . Carotid stenosis 07/29/2019  . Carotid stenosis, right 07/29/2019  . Carotid artery plaque, right 07/19/2019  . Hx of completed stroke 07/19/2019  . Stroke ([place]) 07/06/2019  . TIA (transient ischemic attack) 07/05/2019  . Hypokalemia 07/05/2019  . GERD without esophagitis 06/18/2017  . Essential hypertension 08/19/2016  . Vertigo 12/05/2015  . Adenomatous colon polyp 01/31/2014  . Basal cell carcinoma 09/23/2013  . Hay fever 09/23/2013  . Vitamin D deficiency 09/23/2013  . Gastric catarrh 11/28/2011  . Hyperlipidemia, mixed 11/28/2011  . Preop cardiovascular exam 11/28/2011   11:54 AM, 04/18/20 Etta Grandchild, PT, DPT Physical Therapist - [place] [phone]     Etta Grandchild 04/18/2020, 11:44 AM  [place] MAIN [facility] SERVICES [address] [place], [place], [address] Phone: [phone]   Fax:  [phone]  Name: ANTINIO SANDERFER MRN: [id number] Date of Birth: Apr 08, 1945

## 2020-04-23 ENCOUNTER — Ambulatory Visit: Payer: Medicare Other

## 2020-04-23 ENCOUNTER — Other Ambulatory Visit: Payer: Self-pay

## 2020-04-23 DIAGNOSIS — M25612 Stiffness of left shoulder, not elsewhere classified: Secondary | ICD-10-CM

## 2020-04-23 DIAGNOSIS — R278 Other lack of coordination: Secondary | ICD-10-CM

## 2020-04-23 DIAGNOSIS — I69354 Hemiplegia and hemiparesis following cerebral infarction affecting left non-dominant side: Secondary | ICD-10-CM

## 2020-04-23 DIAGNOSIS — M6281 Muscle weakness (generalized): Secondary | ICD-10-CM

## 2020-04-23 NOTE — Therapy (Signed)
[place] MAIN [facility] SERVICES [address] [place], [place], [address] Phone: [phone]   Fax:  [phone]  Physical Therapy Treatment  Patient Details  Name: Russell Herman MRN: [id number] Date of Birth: September 07, 1945 Referring Provider (PT): Leim Fabry MD (Orthopedics)   Encounter Date: 04/23/2020   PT End of Session - 04/23/20 1133    Visit Number 23    Number of Visits 34    Date for PT Re-Evaluation 05/31/20    Authorization Time Period 01/13/20-04/06/20; 04/05/20-05/31/20    PT Start Time 1124    PT Stop Time 1202    PT Time Calculation (min) 38 min    Equipment Utilized During Treatment Gait belt    Activity Tolerance Patient tolerated treatment well;No increased pain    Behavior During Therapy WFL for tasks assessed/performed           Past Medical History:  Diagnosis Date  . Adenomatous colon polyp   . Allergy   . Arthritis   . Cancer ([place])    basal cell skin  . Carotid stenosis   . Coronary artery disease   . GERD (gastroesophageal reflux disease)   . HLD (hyperlipidemia)   . Hypertension   . Pre-diabetes   . Stroke ([place])   . Wears hearing aid in both ears     Past Surgical History:  Procedure Laterality Date  . cyst removed    . ENDARTERECTOMY Right 08/04/2019   Procedure: ENDARTERECTOMY CAROTID;  Surgeon: Algernon Huxley, MD;  Location: [facility];  Service: Vascular;  Laterality: Right;  . EXCISION OF TONGUE LESION Bilateral 05/05/2019   Procedure: EXCISION OF DORSAL TONGUE LESION;  Surgeon: Margaretha Sheffield, MD;  Location: [place];  Service: ENT;  Laterality: Bilateral;  . SHOULDER ARTHROSCOPY WITH ROTATOR CUFF REPAIR AND SUBACROMIAL DECOMPRESSION Left 01/09/2020   Procedure: Left shoulder arthroscopic  rotator cuff repair with Regeneten patch, subacromial decompression, and biceps tenodesis;  Surgeon: Leim Fabry, MD;  Location: [facility];  Service: Orthopedics;  Laterality: Left;  . skin cancer removed       There were no vitals filed for this visit.   Subjective Assessment - 04/23/20 1128    Subjective Pt doing well today, no big updates. Still working on HEP at home.    Pertinent History Right lateral thalamic infarct mild ataxia and mild hemiparesis. Patient went to rehab 07/07/19-07/15/19. Then he had surgery 7/1 for  s/p successful right CEA    Currently in Pain? Yes    Pain Score 2     Pain Location Shoulder    Pain Orientation Left          INTERVENTION THIS DATE: -seated BUE shoulder flexion table slides x3 minutes -seated LUE shoulder ER table slides x3 minutes -seated LUE shoulder ABDCT table slides x 3 minutes  -seated LUE GHJ external rotation A/ROM 2x15 (elbowsupported, arm in scaption to 65 degrees)  -standing LUE GHJ ABDCT resting- ~75 degrees (cues to avoid compensation movements) 2x15 -standing LUE GHJ flexion 2x15 ~75 degrees -standing LUE cable row + scap retraction 2.5lb 2x12   -Left TRX strap + Rt sidestep walkaway (ABDCT stretch) 5x15sec  -standing 1lb free weight elbow flexion 2x15 -standing triceps rope 7.5lb 2x15 (BUE)      PT Short Term Goals - 04/05/20 1314      PT SHORT TERM GOAL #1   Title After 2 weeks pt will demontrate improve P/ROM shoulder flexion 100 degrees FF as per protocol.  Baseline 85 degrees 1/31: 93 degrees 3/3: 112 degrees    Time 2    Period Weeks    Status Achieved    Target Date 03/19/20      PT SHORT TERM GOAL #2   Title After 2 weeks pt will demonstrate correct performance of HEP for P/ROM of shoulder, and AA/ROM of elbow, wrist, and hand.    Baseline Issued at evlauation 1/31: HEP progressed 3/3: HEP compliant    Time 2    Period Weeks    Status Partially Met    Target Date 04/19/20             PT Long Term Goals - 04/05/20 1122      PT LONG TERM GOAL #1   Title After 8 weeks pt will demonstrate improved daily function AEB FOTO: >43.    Baseline 22 at evaluation. 1/31: 50% 3/3 58%    Time 8    Period Weeks     Status Achieved      PT LONG TERM GOAL #2   Title After 8 weeks pt will demonstrate Left shoulder flexion >135 degrees; MMT flexion 4/5, ABDCT: 5/5, elbow flexion: 5/5.    Baseline not assessed at eval 1/31: flexion arom 91, MMT 2+/5 3/3: supine AROM 112 degrees MMT 2+    Time 8    Period Weeks    Status Partially Met    Target Date 05/31/20      PT LONG TERM GOAL #3   Title Patient will decrease Quick DASH score by > 8 points (32%)  demonstrating reduced self-reported upper extremity disability.    Baseline 3/3/: 40.9%    Time 8    Period Weeks    Status New    Target Date 05/31/20                 Plan - 04/23/20 1135    Clinical Impression Statement Continued with current plan of care as laid out in evaluation and recent prior sessions. Mobility work to begin session (high volume, low intensity ROM), followed by updated shoulder 4-way activation in new positions, focus on available low range GHJ movements without end range scapulothoracic components. Pt remains motivated to advance progress toward goals. Rest breaks provided as needed, pt quick to ask when needed. Author maintains all interventions within appropriate level of intensity as not to purposefully exacerbate pain. Pt does require varying levels of assistance and cuing for completion of exercises for correct form and sometimes due to pain/weakness. Pt continues to demonstrate progress toward goals AEB progression of some interventions this date either in volume or intensity. No updates to HEP this date.   Personal Factors and Comorbidities Age    Comorbidities HTN, Hx CVA    Examination-Activity Limitations Stand;Bathing;Bed Mobility;Reach Overhead;Carry;Sleep    Examination-Participation Restrictions Driving;Yard Work;Meal Prep    Stability/Clinical Decision Making Stable/Uncomplicated    Clinical Decision Making Low    Rehab Potential Good    PT Frequency 2x / week    PT Duration 8 weeks    PT  Treatment/Interventions Patient/family education;Balance training;Neuromuscular re-education;Therapeutic exercise;Stair training;Moist Heat;Functional mobility training;Therapeutic activities;Dry needling;Passive range of motion    PT Next Visit Plan Review HEP, continue with P/ROM, begin scapular strength training    PT Home Exercise Plan No updates today.    Consulted and Agree with Plan of Care Patient    Family Member Consulted wife           Patient will benefit from skilled therapeutic  intervention in order to improve the following deficits and impairments:  Decreased activity tolerance,Decreased strength,Difficulty walking,Decreased balance,Decreased coordination,Decreased mobility,Decreased endurance  Visit Diagnosis: Stiffness of left shoulder, not elsewhere classified  Hemiparesis affecting left side as late effect of stroke ([place])  Muscle weakness (generalized)  Other lack of coordination     Problem List Patient Active Problem List   Diagnosis Date Noted  . History of stroke with residual effects 11/17/2019  . Right shoulder pain 10/25/2019  . Weakness 10/25/2019  . Decreased GFR 08/15/2019  . Dyslipidemia 08/15/2019  . AKI (acute kidney injury) ([place]) 08/15/2019  . Hemiparesis affecting left side as late effect of stroke ([place]) 08/15/2019  . Carotid stenosis 07/29/2019  . Carotid stenosis, right 07/29/2019  . Carotid artery plaque, right 07/19/2019  . Hx of completed stroke 07/19/2019  . Stroke ([place]) 07/06/2019  . TIA (transient ischemic attack) 07/05/2019  . Hypokalemia 07/05/2019  . GERD without esophagitis 06/18/2017  . Essential hypertension 08/19/2016  . Vertigo 12/05/2015  . Adenomatous colon polyp 01/31/2014  . Basal cell carcinoma 09/23/2013  . Hay fever 09/23/2013  . Vitamin D deficiency 09/23/2013  . Gastric catarrh 11/28/2011  . Hyperlipidemia, mixed 11/28/2011  . Preop cardiovascular exam 11/28/2011   12:00 PM, 04/23/20 Etta Grandchild, PT,  DPT Physical Therapist - [place] [phone]     Etta Grandchild 04/23/2020, 11:39 AM  [place] MAIN [facility] SERVICES [address] [place], [place], [address] Phone: [phone]   Fax:  [phone]  Name: Russell Herman MRN: [id number] Date of Birth: [birth year]

## 2020-04-24 DIAGNOSIS — R200 Anesthesia of skin: Secondary | ICD-10-CM | POA: Insufficient documentation

## 2020-04-25 ENCOUNTER — Other Ambulatory Visit: Payer: Self-pay

## 2020-04-25 ENCOUNTER — Ambulatory Visit: Payer: Medicare Other

## 2020-04-25 DIAGNOSIS — M25612 Stiffness of left shoulder, not elsewhere classified: Secondary | ICD-10-CM | POA: Diagnosis not present

## 2020-04-25 NOTE — Therapy (Addendum)
[place] MAIN [facility] SERVICES [address] [place], [place], [address] Phone: [phone]   Fax:  [phone]  Physical Therapy Treatment  Patient Details  Name: Russell Herman MRN: [id number] Date of Birth: April 05, 1945 Referring Provider (PT): Leim Fabry MD (Orthopedics)   Encounter Date: 04/25/2020   PT End of Session - 04/25/20 1137    Visit Number 24    Number of Visits 34    Date for PT Re-Evaluation 05/31/20    Authorization Type 1/10 PN 04/11/20    Authorization Time Period 01/13/20-04/06/20; 04/05/20-05/31/20    PT Start Time 1117    PT Stop Time 1157    PT Time Calculation (min) 40 min    Equipment Utilized During Treatment Gait belt    Activity Tolerance Patient tolerated treatment well;No increased pain    Behavior During Therapy WFL for tasks assessed/performed           Past Medical History:  Diagnosis Date  . Adenomatous colon polyp   . Allergy   . Arthritis   . Cancer ([place])    basal cell skin  . Carotid stenosis   . Coronary artery disease   . GERD (gastroesophageal reflux disease)   . HLD (hyperlipidemia)   . Hypertension   . Pre-diabetes   . Stroke ([place])   . Wears hearing aid in both ears     Past Surgical History:  Procedure Laterality Date  . cyst removed    . ENDARTERECTOMY Right 08/04/2019   Procedure: ENDARTERECTOMY CAROTID;  Surgeon: Algernon Huxley, MD;  Location: [facility];  Service: Vascular;  Laterality: Right;  . EXCISION OF TONGUE LESION Bilateral 05/05/2019   Procedure: EXCISION OF DORSAL TONGUE LESION;  Surgeon: Margaretha Sheffield, MD;  Location: [place];  Service: ENT;  Laterality: Bilateral;  . SHOULDER ARTHROSCOPY WITH ROTATOR CUFF REPAIR AND SUBACROMIAL DECOMPRESSION Left 01/09/2020   Procedure: Left shoulder arthroscopic  rotator cuff repair with Regeneten patch, subacromial decompression, and biceps tenodesis;  Surgeon: Leim Fabry, MD;  Location: [facility];  Service: Orthopedics;  Laterality:  Left;  . skin cancer removed      There were no vitals filed for this visit.   Subjective Assessment - 04/25/20 1038    Subjective Pt doing well today. Saw Lenda Kelp giving out stickers. Pain minimal in shoulder, he thinks he's getting better.    Pertinent History Right lateral thalamic infarct mild ataxia and mild hemiparesis. Patient went to rehab 07/07/19-07/15/19. Then he had surgery 7/1 for  s/p successful right CEA    Currently in Pain? Yes    Pain Score 1            INTERVENTION THIS DATE: -Flexion table slides x 2 minutes, then 5x15sec H (bilat) 29inch table -ER table slides left x 50mnutes, then 5x15secH 33.25" table   -AA/ROM Left shoulder ABDCT c TRX strap x3 minutes  -A/ROM standing Left GHJ flexion 2x15 -A/ROM standing Left shoulder ER, elbow supported at 65 degrees abduction/scaption -Left GHJ ABDCT 1x15 A/ROM to 70 degrees (compensation free movement only)  -Left UE cable row 2x15 @ 2.5lb -standing Left GHJ ER c yellow TB 1x12 (tactile cues for form)      PT Short Term Goals - 04/05/20 1314      PT SHORT TERM GOAL #1   Title After 2 weeks pt will demontrate improve P/ROM shoulder flexion 100 degrees FF as per protocol.    Baseline 85 degrees 1/31: 93 degrees 3/3: 112 degrees  Time 2    Period Weeks    Status Achieved    Target Date 03/19/20      PT SHORT TERM GOAL #2   Title After 2 weeks pt will demonstrate correct performance of HEP for P/ROM of shoulder, and AA/ROM of elbow, wrist, and hand.    Baseline Issued at evlauation 1/31: HEP progressed 3/3: HEP compliant    Time 2    Period Weeks    Status Partially Met    Target Date 04/19/20             PT Long Term Goals - 04/05/20 1122      PT LONG TERM GOAL #1   Title After 8 weeks pt will demonstrate improved daily function AEB FOTO: >43.    Baseline 22 at evaluation. 1/31: 50% 3/3 58%    Time 8    Period Weeks    Status Achieved      PT LONG TERM GOAL #2   Title After 8 weeks pt will  demonstrate Left shoulder flexion >135 degrees; MMT flexion 4/5, ABDCT: 5/5, elbow flexion: 5/5.    Baseline not assessed at eval 1/31: flexion arom 91, MMT 2+/5 3/3: supine AROM 112 degrees MMT 2+    Time 8    Period Weeks    Status Partially Met    Target Date 05/31/20      PT LONG TERM GOAL #3   Title Patient will decrease Quick DASH score by > 8 points (32%)  demonstrating reduced self-reported upper extremity disability.    Baseline 3/3/: 40.9%    Time 8    Period Weeks    Status New    Target Date 05/31/20                 Plan - 04/25/20 1141    Clinical Impression Statement Continued with current plan of care as laid out in evaluation and recent prior sessions. Pt remains motivated to advance progress toward goals. Rest breaks provided as needed, pt quick to ask when needed. Author maintains all interventions within appropriate level of intensity as not to purposefully exacerbate pain. Pt does require varying levels of assistance and cuing for completion of exercises for correct form and sometimes due to pain/weakness. Pt continues to demonstrate progress toward goals AEB progression of some interventions this date either in volume or intensity. No updates to HEP this date.    Personal Factors and Comorbidities Age    Comorbidities HTN, Hx CVA    Examination-Activity Limitations Stand;Bathing;Bed Mobility;Reach Overhead;Carry;Sleep    Examination-Participation Restrictions Driving;Yard Work;Meal Prep    Stability/Clinical Decision Making Stable/Uncomplicated    Clinical Decision Making Low    Rehab Potential Good    PT Frequency 2x / week    PT Duration 8 weeks    PT Treatment/Interventions Patient/family education;Balance training;Neuromuscular re-education;Therapeutic exercise;Stair training;Moist Heat;Functional mobility training;Therapeutic activities;Dry needling;Passive range of motion    PT Next Visit Plan Review HEP, continue with P/ROM, begin scapular strength  training    PT Home Exercise Plan No updates today.    Consulted and Agree with Plan of Care Patient           Patient will benefit from skilled therapeutic intervention in order to improve the following deficits and impairments:  Decreased activity tolerance,Decreased strength,Difficulty walking,Decreased balance,Decreased coordination,Decreased mobility,Decreased endurance  Visit Diagnosis: Stiffness of left shoulder, not elsewhere classified     Problem List Patient Active Problem List   Diagnosis Date Noted  . History of  stroke with residual effects 11/17/2019  . Right shoulder pain 10/25/2019  . Weakness 10/25/2019  . Decreased GFR 08/15/2019  . Dyslipidemia 08/15/2019  . AKI (acute kidney injury) ([place]) 08/15/2019  . Hemiparesis affecting left side as late effect of stroke ([place]) 08/15/2019  . Carotid stenosis 07/29/2019  . Carotid stenosis, right 07/29/2019  . Carotid artery plaque, right 07/19/2019  . Hx of completed stroke 07/19/2019  . Stroke ([place]) 07/06/2019  . TIA (transient ischemic attack) 07/05/2019  . Hypokalemia 07/05/2019  . GERD without esophagitis 06/18/2017  . Essential hypertension 08/19/2016  . Vertigo 12/05/2015  . Adenomatous colon polyp 01/31/2014  . Basal cell carcinoma 09/23/2013  . Hay fever 09/23/2013  . Vitamin D deficiency 09/23/2013  . Gastric catarrh 11/28/2011  . Hyperlipidemia, mixed 11/28/2011  . Preop cardiovascular exam 11/28/2011   12:01 PM, 04/25/20 Etta Grandchild, PT, DPT Physical Therapist - [place] [phone]     Etta Grandchild 04/25/2020, 11:53 AM  [place] MAIN [facility] SERVICES [address] [place], [place], [address] Phone: [phone]   Fax:  [phone]  Name: Russell Herman MRN: [id number] Date of Birth: [birth year]

## 2020-04-30 ENCOUNTER — Ambulatory Visit: Payer: Medicare Other

## 2020-04-30 ENCOUNTER — Other Ambulatory Visit: Payer: Self-pay

## 2020-04-30 DIAGNOSIS — M25612 Stiffness of left shoulder, not elsewhere classified: Secondary | ICD-10-CM | POA: Diagnosis not present

## 2020-04-30 DIAGNOSIS — R278 Other lack of coordination: Secondary | ICD-10-CM

## 2020-04-30 DIAGNOSIS — M6281 Muscle weakness (generalized): Secondary | ICD-10-CM

## 2020-04-30 DIAGNOSIS — I69354 Hemiplegia and hemiparesis following cerebral infarction affecting left non-dominant side: Secondary | ICD-10-CM

## 2020-04-30 DIAGNOSIS — I6309 Cerebral infarction due to thrombosis of other precerebral artery: Secondary | ICD-10-CM

## 2020-04-30 NOTE — Therapy (Signed)
[facility] SERVICES [address] [place], [place], [address] Phone: [phone]   Fax:  [phone]  Physical Therapy Treatment  Patient Details  Name: Russell Herman MRN: [id number] Date of Birth: 08/16/1945 Referring Provider (PT): Sunny Patel MD (Orthopedics)   Encounter Date: 04/30/2020   PT End of Session - 04/30/20 1203    Visit Number 25    Number of Visits 34    Date for PT Re-Evaluation 05/31/20    Authorization Type 1/10 PN 04/11/20    Authorization Time Period 01/13/20-04/06/20; 04/05/20-05/31/20    PT Start Time 1115    PT Stop Time 1200    PT Time Calculation (min) 45 min    Activity Tolerance Patient tolerated treatment well;No increased pain    Behavior During Therapy WFL for tasks assessed/performed           Past Medical History:  Diagnosis Date  . Adenomatous colon polyp   . Allergy   . Arthritis   . Cancer (HCC)    basal cell skin  . Carotid stenosis   . Coronary artery disease   . GERD (gastroesophageal reflux disease)   . HLD (hyperlipidemia)   . Hypertension   . Pre-diabetes   . Stroke (HCC)   . Wears hearing aid in both ears     Past Surgical History:  Procedure Laterality Date  . cyst removed    . ENDARTERECTOMY Right 08/04/2019   Procedure: ENDARTERECTOMY CAROTID;  Surgeon: Dew, Jason S, MD;  Location: [facility];  Service: Vascular;  Laterality: Right;  . EXCISION OF TONGUE LESION Bilateral 05/05/2019   Procedure: EXCISION OF DORSAL TONGUE LESION;  Surgeon: Juengel, Paul, MD;  Location: [facility];  Service: ENT;  Laterality: Bilateral;  . SHOULDER ARTHROSCOPY WITH ROTATOR CUFF REPAIR AND SUBACROMIAL DECOMPRESSION Left 01/09/2020   Procedure: Left shoulder arthroscopic  rotator cuff repair with Regeneten patch, subacromial decompression, and biceps tenodesis;  Surgeon: Patel, Sunny, MD;  Location: [facility];  Service: Orthopedics;  Laterality: Left;  . skin cancer removed      There were no  vitals filed for this visit.   Subjective Assessment - 04/30/20 1118    Subjective Pt reports mild generalized L sided pain. Otherwise pt is doing well. Pt states he's taking a new medication to address tingling in UE, but doesn't remember the name. Will bring this info to next session.    Pertinent History Right lateral thalamic infarct mild ataxia and mild hemiparesis. Patient went to rehab 07/07/19-07/15/19. Then he had surgery 7/1 for  s/p successful right CEA    How long can you sit comfortably? not related    How long can you stand comfortably? not related    How long can you walk comfortably? not related    Currently in Pain? Yes    Pain Score 1     Pain Location Shoulder    Pain Orientation Left    Pain Descriptors / Indicators Aching    Pain Type Chronic pain           SciFit; Lvl 4, 2 minutes forward, 2 minutes backwards, cueing for keeping elbow into side ; improved control noted this session compared to previous sessions.    Manual:  Supine:  Passive L shoulder ROM with multipoint holds for tension reduction: Flexion 10x 20 second holds, Glenohumeral AP, PA mobilizations grade II with IR/ER bias, 4x 15 seconds for pain reduction.    Therex: cues for guided motion  Supine: -   AAROM flexion 15x 3 second hold with 2lb bar - AROM flexion 10x 3 second holds cues for thumb up in the air ; 2lb dumbbell flexion 10x  - AROM PNF with PT assistance D1 pattern 10x, D2 pattern 10x; 2lb dumbbell - Scapular punches modified LUE 15x ; very challenging for patient; 2lb dumbbell  - Stabilization intervention against perturbation holding RTB  3x20 seconds     Sidelying: ER AROM 15x with PT stabilization for alignment 2lb dumbbell Abduction 15x with PT stabilization for alignment    Seated: -scapular retraction/row 15x with RTB -ER/IR AROM  RTB 15x with cues for stabilization to side ; x 2 sets  -UE ranger standing : flexion/extension 10x, abduction/adduction 10x, circle 10x    Pt  tolerated treatment well today. Improved L shoulder AROM noted with supine therex post MT. Increased hand over hand cueing required during PNF therex due to limited IR/ER which limits independence with ADL's and IADL's. Pt will continue to benefit from skilled PT services to address deficits for return to baseline function. Pt may benefit from LUE scapular mobilizations coupled with shoulder AROM at next session, as pt demonstrates decreased scapular mobility with sidelying ABD. Will continue per POC.             PT Education - 04/30/20 1137    Education Details exercise technique, body mechanics    Person(s) Educated Patient    Methods Explanation;Demonstration;Tactile cues;Verbal cues    Comprehension Verbalized understanding;Returned demonstration;Verbal cues required;Tactile cues required            PT Short Term Goals - 04/05/20 1314      PT SHORT TERM GOAL #1   Title After 2 weeks pt will demontrate improve P/ROM shoulder flexion 100 degrees FF as per protocol.    Baseline 85 degrees 1/31: 93 degrees 3/3: 112 degrees    Time 2    Period Weeks    Status Achieved    Target Date 03/19/20      PT SHORT TERM GOAL #2   Title After 2 weeks pt will demonstrate correct performance of HEP for P/ROM of shoulder, and AA/ROM of elbow, wrist, and hand.    Baseline Issued at evlauation 1/31: HEP progressed 3/3: HEP compliant    Time 2    Period Weeks    Status Partially Met    Target Date 04/19/20             PT Long Term Goals - 04/05/20 1122      PT LONG TERM GOAL #1   Title After 8 weeks pt will demonstrate improved daily function AEB FOTO: >43.    Baseline 22 at evaluation. 1/31: 50% 3/3 58%    Time 8    Period Weeks    Status Achieved      PT LONG TERM GOAL #2   Title After 8 weeks pt will demonstrate Left shoulder flexion >135 degrees; MMT flexion 4/5, ABDCT: 5/5, elbow flexion: 5/5.    Baseline not assessed at eval 1/31: flexion arom 91, MMT 2+/5 3/3: supine AROM  112 degrees MMT 2+    Time 8    Period Weeks    Status Partially Met    Target Date 05/31/20      PT LONG TERM GOAL #3   Title Patient will decrease Quick DASH score by > 8 points (32%)  demonstrating reduced self-reported upper extremity disability.    Baseline 3/3/: 40.9%    Time 8    Period Weeks  Status New    Target Date 05/31/20                 Plan - 04/30/20 1158    Clinical Impression Statement Pt tolerated treatment well today. Improved L shoulder AROM noted with supine therex post MT. Increased hand over hand cueing required during PNF therex due to limited IR/ER which limits independence with ADL's and IADL's. Pt will continue to benefit from skilled PT services to address deficits for return to baseline function. Pt may benefit from LUE scapular mobilizations coupled with shoulder AROM at next session, as pt demonstrates decreased scapular mobility with sidelying ABD. Will continue per POC.    Personal Factors and Comorbidities Age    Comorbidities HTN, Hx CVA    Examination-Activity Limitations Stand;Bathing;Bed Mobility;Reach Overhead;Carry;Sleep    Examination-Participation Restrictions Driving;Yard Work;Meal Prep    Stability/Clinical Decision Making Stable/Uncomplicated    Rehab Potential Good    PT Frequency 2x / week    PT Duration 8 weeks    PT Treatment/Interventions Patient/family education;Balance training;Neuromuscular re-education;Therapeutic exercise;Stair training;Moist Heat;Functional mobility training;Therapeutic activities;Dry needling;Passive range of motion    PT Next Visit Plan Review HEP, continue with P/ROM, begin scapular strength training    PT Home Exercise Plan No updates today.    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 History of stroke with residual effects 11/17/2019  . Right shoulder pain 10/25/2019  . Weakness 10/25/2019  . Decreased GFR 08/15/2019  . Dyslipidemia 08/15/2019  . AKI (acute kidney injury) (HCC) 08/15/2019  . Hemiparesis affecting left side as late effect of stroke (HCC) 08/15/2019  . Carotid stenosis 07/29/2019  . Carotid stenosis, right 07/29/2019  . Carotid artery plaque, right 07/19/2019  . Hx of completed stroke 07/19/2019  . Stroke (HCC) 07/06/2019  . TIA (transient ischemic attack) 07/05/2019  . Hypokalemia 07/05/2019  . GERD without esophagitis 06/18/2017  . Essential hypertension 08/19/2016  . Vertigo 12/05/2015  . Adenomatous colon polyp 01/31/2014  . Basal cell carcinoma 09/23/2013  . Hay fever 09/23/2013  . Vitamin D deficiency 09/23/2013  . Gastric catarrh 11/28/2011  . Hyperlipidemia, mixed 11/28/2011  . Preop cardiovascular exam 11/28/2011   Sara E. Burnett, PT, DPT 12:06 PM,04/30/20   [facility] [address] [place], [place], [address] Phone: [phone]   Fax:  [phone]  Name: Bodey H Parfitt MRN: [id number] Date of Birth: 12/07/1945   

## 2020-05-02 ENCOUNTER — Other Ambulatory Visit: Payer: Self-pay

## 2020-05-02 ENCOUNTER — Ambulatory Visit: Payer: Medicare Other

## 2020-05-02 DIAGNOSIS — I69354 Hemiplegia and hemiparesis following cerebral infarction affecting left non-dominant side: Secondary | ICD-10-CM

## 2020-05-02 DIAGNOSIS — R278 Other lack of coordination: Secondary | ICD-10-CM

## 2020-05-02 DIAGNOSIS — M25612 Stiffness of left shoulder, not elsewhere classified: Secondary | ICD-10-CM

## 2020-05-02 DIAGNOSIS — M6281 Muscle weakness (generalized): Secondary | ICD-10-CM

## 2020-05-02 NOTE — Therapy (Signed)
[place] [facility] SERVICES [address] [place], [place], [address] Phone: [phone]   Fax:  [phone]  Physical Therapy Treatment  Patient Details  Name: Russell Herman MRN: [id number] Date of Birth: [birth year] Referring Provider (PT): Leim Fabry MD (Orthopedics)   Encounter Date: 05/02/2020   PT End of Session - 05/02/20 1105    Visit Number 26    Number of Visits 34    Date for PT Re-Evaluation 05/31/20    Authorization Type 6/10 PN 04/11/20    Authorization Time Period 01/13/20-04/06/20; 04/05/20-05/31/20    PT Start Time 1115    PT Stop Time 1159    PT Time Calculation (min) 44 min    Activity Tolerance Patient tolerated treatment well;No increased pain    Behavior During Therapy WFL for tasks assessed/performed           Past Medical History:  Diagnosis Date  . Adenomatous colon polyp   . Allergy   . Arthritis   . Cancer ([place])    basal cell skin  . Carotid stenosis   . Coronary artery disease   . GERD (gastroesophageal reflux disease)   . HLD (hyperlipidemia)   . Hypertension   . Pre-diabetes   . Stroke ([place])   . Wears hearing aid in both ears     Past Surgical History:  Procedure Laterality Date  . cyst removed    . ENDARTERECTOMY Right 08/04/2019   Procedure: ENDARTERECTOMY CAROTID;  Surgeon: Algernon Huxley, MD;  Location: [facility];  Service: Vascular;  Laterality: Right;  . EXCISION OF TONGUE LESION Bilateral 05/05/2019   Procedure: EXCISION OF DORSAL TONGUE LESION;  Surgeon: Margaretha [place], MD;  Location: [facility];  Service: ENT;  Laterality: Bilateral;  . SHOULDER ARTHROSCOPY WITH ROTATOR CUFF REPAIR AND SUBACROMIAL DECOMPRESSION Left 01/09/2020   Procedure: Left shoulder arthroscopic  rotator cuff repair with Regeneten patch, subacromial decompression, and biceps tenodesis;  Surgeon: Leim Fabry, MD;  Location: [facility];  Service: Orthopedics;  Laterality: Left;  . skin cancer removed      There were no  vitals filed for this visit.   Subjective Assessment - 05/02/20 1116    Subjective Patient reports he continues to have soreness in his whole left side. Reports his arm is moving better.    Pertinent History Right lateral thalamic infarct mild ataxia and mild hemiparesis. Patient went to rehab 07/07/19-07/15/19. Then he had surgery 7/1 for  s/p successful right CEA    How long can you sit comfortably? not related    How long can you stand comfortably? not related    How long can you walk comfortably? not related    Currently in Pain? Yes    Pain Score 1     Pain Location Shoulder    Pain Orientation Left    Pain Descriptors / Indicators Aching    Pain Type Chronic pain    Pain Onset More than a month ago    Pain Frequency Constant                SciFit; Lvl 4, 2 minutes forward, 2 minutes backwards, cueing for keeping elbow into side ; improved control noted this session compared to previous sessions.    Manual:  Supine:  Passive L shoulder ROM with multipoint holds for tension reduction: Flexion 10x 20 second holds, Glenohumeral AP, PA mobilizations grade II with IR/ER bias, 4x 15 seconds for pain reduction.   scapular retraction and depression with  overpressure 10x 10 second holds Distraction between interventions for pain relief multiple trials x 10 seconds  Therex: cues for guided motion  Supine: - AAROM flexion 10x 10 second hold with 2lb bar - AROM flexion 10x 3 second holds cues for thumb up in the air ; 2lb dumbbell flexion 10x  - AROM PNF with PT assistance D1 pattern 10x, D2 pattern 10x; 2lb dumbbell - Scapular punches modified LUE 10x ; very challenging for patient; 2lb dumbbell  - Stabilization intervention against perturbation 3x20 seconds     Sidelying: ER AROM 15x with PT stabilization for alignment 2lb dumbbell; x2 sets Abduction 15x with PT stabilization for alignment    Seated: -scapular retraction/row 15x with RTB -ER/IR AROM  RTB 15x with cues for  stabilization to side ; x 2 sets      Pt educated throughout session about proper posture and technique with exercises. Improved exercise technique, movement at target joints, use of target muscles after min to mod verbal, visual, tactile cues   Patient remains highly motivated throughout physical therapy session. He continues to be limited by tone and compensatory internal rotation. Scapuar mobility will continue to be an area of focus due to hypomobility and limited surrounding muscle tissue activation. Pt may benefit from LUE scapular mobilizations coupled with shoulder AROM at next session, as pt demonstrates decreased scapular mobility with sidelying ABD. Will continue per POC.                       PT Education - 05/02/20 1105    Education Details exercise technique, body mechanics    Person(s) Educated Patient    Methods Explanation;Demonstration;Tactile cues;Verbal cues    Comprehension Verbalized understanding;Returned demonstration;Verbal cues required;Tactile cues required;Need further instruction            PT Short Term Goals - 04/05/20 1314      PT SHORT TERM GOAL #1   Title After 2 weeks pt will demontrate improve P/ROM shoulder flexion 100 degrees FF as per protocol.    Baseline 85 degrees 1/31: 93 degrees 3/3: 112 degrees    Time 2    Period Weeks    Status Achieved    Target Date 03/19/20      PT SHORT TERM GOAL #2   Title After 2 weeks pt will demonstrate correct performance of HEP for P/ROM of shoulder, and AA/ROM of elbow, wrist, and hand.    Baseline Issued at evlauation 1/31: HEP progressed 3/3: HEP compliant    Time 2    Period Weeks    Status Partially Met    Target Date 04/19/20             PT Long Term Goals - 04/05/20 1122      PT LONG TERM GOAL #1   Title After 8 weeks pt will demonstrate improved daily function AEB FOTO: >43.    Baseline 22 at evaluation. 1/31: 50% 3/3 58%    Time 8    Period Weeks    Status Achieved       PT LONG TERM GOAL #2   Title After 8 weeks pt will demonstrate Left shoulder flexion >135 degrees; MMT flexion 4/5, ABDCT: 5/5, elbow flexion: 5/5.    Baseline not assessed at eval 1/31: flexion arom 91, MMT 2+/5 3/3: supine AROM 112 degrees MMT 2+    Time 8    Period Weeks    Status Partially Met    Target Date 05/31/20  PT LONG TERM GOAL #3   Title Patient will decrease Quick DASH score by > 8 points (32%)  demonstrating reduced self-reported upper extremity disability.    Baseline 3/3/: 40.9%    Time 8    Period Weeks    Status New    Target Date 05/31/20                 Plan - 05/02/20 1129    Clinical Impression Statement Patient remains highly motivated throughout physical therapy session. He continues to be limited by tone and compensatory internal rotation. Scapuar mobility will continue to be an area of focus due to hypomobility and limited surrounding muscle tissue activation. Pt may benefit from LUE scapular mobilizations coupled with shoulder AROM at next session, as pt demonstrates decreased scapular mobility with sidelying ABD. Will continue per POC.    Personal Factors and Comorbidities Age    Comorbidities HTN, Hx CVA    Examination-Activity Limitations Stand;Bathing;Bed Mobility;Reach Overhead;Carry;Sleep    Examination-Participation Restrictions Driving;Yard Work;Meal Prep    Stability/Clinical Decision Making Stable/Uncomplicated    Rehab Potential Good    PT Frequency 2x / week    PT Duration 8 weeks    PT Treatment/Interventions Patient/family education;Balance training;Neuromuscular re-education;Therapeutic exercise;Stair training;Moist Heat;Functional mobility training;Therapeutic activities;Dry needling;Passive range of motion    PT Next Visit Plan Review HEP, continue with P/ROM, begin scapular strength training    PT Home Exercise Plan No updates today.    Consulted and Agree with Plan of Care Patient    Family Member Consulted wife            Patient will benefit from skilled therapeutic intervention in order to improve the following deficits and impairments:  Decreased activity tolerance,Decreased strength,Difficulty walking,Decreased balance,Decreased coordination,Decreased mobility,Decreased endurance  Visit Diagnosis: Stiffness of left shoulder, not elsewhere classified  Hemiparesis affecting left side as late effect of stroke ([place])  Muscle weakness (generalized)  Other lack of coordination     Problem List Patient Active Problem List   Diagnosis Date Noted  . History of stroke with residual effects 11/17/2019  . Right shoulder pain 10/25/2019  . Weakness 10/25/2019  . Decreased GFR 08/15/2019  . Dyslipidemia 08/15/2019  . AKI (acute kidney injury) ([place]) 08/15/2019  . Hemiparesis affecting left side as late effect of stroke ([place]) 08/15/2019  . Carotid stenosis 07/29/2019  . Carotid stenosis, right 07/29/2019  . Carotid artery plaque, right 07/19/2019  . Hx of completed stroke 07/19/2019  . Stroke ([place]) 07/06/2019  . TIA (transient ischemic attack) 07/05/2019  . Hypokalemia 07/05/2019  . GERD without esophagitis 06/18/2017  . Essential hypertension 08/19/2016  . Vertigo 12/05/2015  . Adenomatous colon polyp 01/31/2014  . Basal cell carcinoma 09/23/2013  . Hay fever 09/23/2013  . Vitamin D deficiency 09/23/2013  . Gastric catarrh 11/28/2011  . Hyperlipidemia, mixed 11/28/2011  . Preop cardiovascular exam 11/28/2011   Janna Arch, PT, DPT   05/02/2020, 12:01 PM  [place] [facility] SERVICES [address] [place], [place], [address] Phone: [phone]   Fax:  [phone]  Name: Russell Herman MRN: [id number] Date of Birth: [birth year]

## 2020-05-07 ENCOUNTER — Other Ambulatory Visit: Payer: Self-pay

## 2020-05-07 ENCOUNTER — Ambulatory Visit: Payer: Medicare Other | Attending: Orthopedic Surgery

## 2020-05-07 DIAGNOSIS — R278 Other lack of coordination: Secondary | ICD-10-CM | POA: Insufficient documentation

## 2020-05-07 DIAGNOSIS — M6281 Muscle weakness (generalized): Secondary | ICD-10-CM | POA: Insufficient documentation

## 2020-05-07 DIAGNOSIS — I69354 Hemiplegia and hemiparesis following cerebral infarction affecting left non-dominant side: Secondary | ICD-10-CM | POA: Diagnosis present

## 2020-05-07 DIAGNOSIS — I6309 Cerebral infarction due to thrombosis of other precerebral artery: Secondary | ICD-10-CM | POA: Diagnosis not present

## 2020-05-07 DIAGNOSIS — M25612 Stiffness of left shoulder, not elsewhere classified: Secondary | ICD-10-CM | POA: Diagnosis not present

## 2020-05-07 NOTE — Therapy (Signed)
[place] MAIN [facility] SERVICES [address] [place], [place], [address] Phone: [phone]   Fax:  [phone]  Physical Therapy Treatment  Patient Details  Name: Russell Herman MRN: [id number] Date of Birth: 1945-11-08 Referring Provider (PT): Leim Fabry MD (Orthopedics)   Encounter Date: 05/07/2020   PT End of Session - 05/07/20 1122    Visit Number 27    Number of Visits 34    Date for PT Re-Evaluation 05/31/20    Authorization Type 6/10 PN 04/11/20    Authorization Time Period 01/13/20-04/06/20; 04/05/20-05/31/20    PT Start Time 1115    PT Stop Time 1200    PT Time Calculation (min) 45 min    Activity Tolerance Patient tolerated treatment well;No increased pain    Behavior During Therapy WFL for tasks assessed/performed           Past Medical History:  Diagnosis Date  . Adenomatous colon polyp   . Allergy   . Arthritis   . Cancer ([place])    basal cell skin  . Carotid stenosis   . Coronary artery disease   . GERD (gastroesophageal reflux disease)   . HLD (hyperlipidemia)   . Hypertension   . Pre-diabetes   . Stroke ([place])   . Wears hearing aid in both ears     Past Surgical History:  Procedure Laterality Date  . cyst removed    . ENDARTERECTOMY Right 08/04/2019   Procedure: ENDARTERECTOMY CAROTID;  Surgeon: Algernon Huxley, MD;  Location: [facility];  Service: Vascular;  Laterality: Right;  . EXCISION OF TONGUE LESION Bilateral 05/05/2019   Procedure: EXCISION OF DORSAL TONGUE LESION;  Surgeon: Margaretha Sheffield, MD;  Location: [place];  Service: ENT;  Laterality: Bilateral;  . SHOULDER ARTHROSCOPY WITH ROTATOR CUFF REPAIR AND SUBACROMIAL DECOMPRESSION Left 01/09/2020   Procedure: Left shoulder arthroscopic  rotator cuff repair with Regeneten patch, subacromial decompression, and biceps tenodesis;  Surgeon: Leim Fabry, MD;  Location: [facility];  Service: Orthopedics;  Laterality: Left;  . skin cancer removed      There were no  vitals filed for this visit.   Subjective Assessment - 05/07/20 1119    Subjective Pt reports his arm is moving better but is not feeling any better, noting some residual soreness in the shoulder.    Pertinent History Right lateral thalamic infarct mild ataxia and mild hemiparesis. Patient went to rehab 07/07/19-07/15/19. Then he had surgery 7/1 for  s/p successful right CEA    How long can you sit comfortably? not related    How long can you stand comfortably? not related    How long can you walk comfortably? not related    Currently in Pain? Yes    Pain Score 2     Pain Location Shoulder    Pain Orientation Left    Pain Descriptors / Indicators Dull    Pain Type Chronic pain              SciFit; Lvl 4, 2 minutes forward, 2 minutes backwards, cueing for keeping elbow into side ; improved control noted this session compared to previous sessions.    Manual:  Supine:  Passive L shoulder ROM with multipoint holds for tension reduction: Flexion 10x 20 second holds, Glenohumeral AP, PA mobilizations grade II with IR/ER bias, 4x 15 seconds for pain reduction.   scapular retraction and depression with overpressure 10x 10 second holds Distraction between interventions for pain relief multiple trials x 10 seconds  Therex: cues for guided motion  Supine: - AAROM flexion 10x 10 second hold with 3.5lb bar - AROM flexion 10x 3 second holds cues for thumb up in the air ; 3lb dumbbell flexion 10x  - AROM PNF with PT assistance D1 pattern 10x, D2 pattern 10x; 3lb dumbbell - Scapular punches modified LUE 10x ; very challenging for patient; 3lb dumbbell  - Stabilization intervention against perturbation 3x20 seconds    Sidelying: ER AROM 15x with PT stabilization for alignment 3lb dumbbell; x1 set (attempted a second set, but was experiencing pain) Abduction 15x with PT stabilization for alignment    Seated: -Scapular retraction/row 15x with RTB -ER/IR AROM  RTB 15x with cues for  stabilization to side ; x 2 sets  -Shoulder Flexion AAROM with PVC pipe, PT mobilization of scapular concurrently, 2x15     Pt educated throughout session about proper posture and technique with exercises. Improved exercise technique, movement at target joints, use of target muscles after min to mod verbal, visual, tactile cues            PT Education - 05/07/20 1121    Education Details exercise technique, body mechanics    Person(s) Educated Patient    Methods Explanation;Demonstration;Tactile cues;Verbal cues    Comprehension Verbalized understanding;Returned demonstration;Verbal cues required;Tactile cues required;Need further instruction            PT Short Term Goals - 04/05/20 1314      PT SHORT TERM GOAL #1   Title After 2 weeks pt will demontrate improve P/ROM shoulder flexion 100 degrees FF as per protocol.    Baseline 85 degrees 1/31: 93 degrees 3/3: 112 degrees    Time 2    Period Weeks    Status Achieved    Target Date 03/19/20      PT SHORT TERM GOAL #2   Title After 2 weeks pt will demonstrate correct performance of HEP for P/ROM of shoulder, and AA/ROM of elbow, wrist, and hand.    Baseline Issued at evlauation 1/31: HEP progressed 3/3: HEP compliant    Time 2    Period Weeks    Status Partially Met    Target Date 04/19/20             PT Long Term Goals - 04/05/20 1122      PT LONG TERM GOAL #1   Title After 8 weeks pt will demonstrate improved daily function AEB FOTO: >43.    Baseline 22 at evaluation. 1/31: 50% 3/3 58%    Time 8    Period Weeks    Status Achieved      PT LONG TERM GOAL #2   Title After 8 weeks pt will demonstrate Left shoulder flexion >135 degrees; MMT flexion 4/5, ABDCT: 5/5, elbow flexion: 5/5.    Baseline not assessed at eval 1/31: flexion arom 91, MMT 2+/5 3/3: supine AROM 112 degrees MMT 2+    Time 8    Period Weeks    Status Partially Met    Target Date 05/31/20      PT LONG TERM GOAL #3   Title Patient will  decrease Quick DASH score by > 8 points (32%)  demonstrating reduced self-reported upper extremity disability.    Baseline 3/3/: 40.9%    Time 8    Period Weeks    Status New    Target Date 05/31/20                 Plan - 05/07/20 1150  Clinical Impression Statement Pt continues to perform well during therapy session staying motivated towards current goals.  Pt has some difficulty with compensatory IR and scapular mobility that limits full ROM.  Pt will continue to benefit from skilled therapy to address remaining deficits and will continue with current POC.    Personal Factors and Comorbidities Age    Comorbidities HTN, Hx CVA    Examination-Activity Limitations Stand;Bathing;Bed Mobility;Reach Overhead;Carry;Sleep    Examination-Participation Restrictions Driving;Yard Work;Meal Prep    Stability/Clinical Decision Making Stable/Uncomplicated    Rehab Potential Good    PT Frequency 2x / week    PT Duration 8 weeks    PT Treatment/Interventions Patient/family education;Balance training;Neuromuscular re-education;Therapeutic exercise;Stair training;Moist Heat;Functional mobility training;Therapeutic activities;Dry needling;Passive range of motion    PT Next Visit Plan Review HEP, continue with P/ROM, begin scapular strength training    PT Home Exercise Plan No updates today.    Consulted and Agree with Plan of Care Patient    Family Member Consulted --           Patient will benefit from skilled therapeutic intervention in order to improve the following deficits and impairments:  Decreased activity tolerance,Decreased strength,Difficulty walking,Decreased balance,Decreased coordination,Decreased mobility,Decreased endurance  Visit Diagnosis: Stiffness of left shoulder, not elsewhere classified  Hemiparesis affecting left side as late effect of stroke (HCC)  Muscle weakness (generalized)  Other lack of coordination  Cerebrovascular accident (CVA) due to thrombosis of  other precerebral artery [facility])     Problem List Patient Active Problem List   Diagnosis Date Noted  . History of stroke with residual effects 11/17/2019  . Right shoulder pain 10/25/2019  . Weakness 10/25/2019  . Decreased GFR 08/15/2019  . Dyslipidemia 08/15/2019  . AKI (acute kidney injury) ([place]) 08/15/2019  . Hemiparesis affecting left side as late effect of stroke ([place]) 08/15/2019  . Carotid stenosis 07/29/2019  . Carotid stenosis, right 07/29/2019  . Carotid artery plaque, right 07/19/2019  . Hx of completed stroke 07/19/2019  . Stroke ([place]) 07/06/2019  . TIA (transient ischemic attack) 07/05/2019  . Hypokalemia 07/05/2019  . GERD without esophagitis 06/18/2017  . Essential hypertension 08/19/2016  . Vertigo 12/05/2015  . Adenomatous colon polyp 01/31/2014  . Basal cell carcinoma 09/23/2013  . Hay fever 09/23/2013  . Vitamin D deficiency 09/23/2013  . Gastric catarrh 11/28/2011  . Hyperlipidemia, mixed 11/28/2011  . Preop cardiovascular exam 11/28/2011   Janna Arch, PT, DPT   05/07/2020, 11:58 AM  [place] MAIN [facility] SERVICES [address] [place], [place], [address] Phone: [phone]   Fax:  [phone]  Name: Russell Herman MRN: [id number] Date of Birth: 1945/04/25

## 2020-05-09 ENCOUNTER — Ambulatory Visit: Payer: Medicare Other

## 2020-05-09 ENCOUNTER — Other Ambulatory Visit: Payer: Self-pay

## 2020-05-09 DIAGNOSIS — M6281 Muscle weakness (generalized): Secondary | ICD-10-CM

## 2020-05-09 DIAGNOSIS — M25612 Stiffness of left shoulder, not elsewhere classified: Secondary | ICD-10-CM | POA: Diagnosis not present

## 2020-05-09 DIAGNOSIS — R278 Other lack of coordination: Secondary | ICD-10-CM

## 2020-05-09 DIAGNOSIS — I69354 Hemiplegia and hemiparesis following cerebral infarction affecting left non-dominant side: Secondary | ICD-10-CM

## 2020-05-09 NOTE — Therapy (Signed)
[place] MAIN [facility] SERVICES [address] [place], [place], [address] Phone: [phone]   Fax:  [phone]  Physical Therapy Treatment  Patient Details  Name: Russell Herman MRN: [id number] Date of Birth: 1945-05-05 Referring Provider (PT): Leim Fabry MD (Orthopedics)   Encounter Date: 05/09/2020   PT End of Session - 05/09/20 1129    Visit Number 28    Number of Visits 34    Date for PT Re-Evaluation 05/31/20    Authorization Type 8/10 PN 04/11/20    Authorization Time Period 01/13/20-04/06/20; 04/05/20-05/31/20    PT Start Time 1116    PT Stop Time 1159    PT Time Calculation (min) 43 min    Activity Tolerance Patient tolerated treatment well;No increased pain    Behavior During Therapy WFL for tasks assessed/performed           Past Medical History:  Diagnosis Date  . Adenomatous colon polyp   . Allergy   . Arthritis   . Cancer ([place])    basal cell skin  . Carotid stenosis   . Coronary artery disease   . GERD (gastroesophageal reflux disease)   . HLD (hyperlipidemia)   . Hypertension   . Pre-diabetes   . Stroke ([place])   . Wears hearing aid in both ears     Past Surgical History:  Procedure Laterality Date  . cyst removed    . ENDARTERECTOMY Right 08/04/2019   Procedure: ENDARTERECTOMY CAROTID;  Surgeon: Algernon Huxley, MD;  Location: [facility];  Service: Vascular;  Laterality: Right;  . EXCISION OF TONGUE LESION Bilateral 05/05/2019   Procedure: EXCISION OF DORSAL TONGUE LESION;  Surgeon: Margaretha Sheffield, MD;  Location: [place];  Service: ENT;  Laterality: Bilateral;  . SHOULDER ARTHROSCOPY WITH ROTATOR CUFF REPAIR AND SUBACROMIAL DECOMPRESSION Left 01/09/2020   Procedure: Left shoulder arthroscopic  rotator cuff repair with Regeneten patch, subacromial decompression, and biceps tenodesis;  Surgeon: Leim Fabry, MD;  Location: [facility];  Service: Orthopedics;  Laterality: Left;  . skin cancer removed      There were no  vitals filed for this visit.   Subjective Assessment - 05/09/20 1127    Subjective Patient reports he is on double dose of gapabentine per doctor request however does not like it because it makes him very sleepy.    Pertinent History Right lateral thalamic infarct mild ataxia and mild hemiparesis. Patient went to rehab 07/07/19-07/15/19. Then he had surgery 7/1 for  s/p successful right CEA    How long can you sit comfortably? not related    How long can you stand comfortably? not related    How long can you walk comfortably? not related    Currently in Pain? Yes    Pain Score 2     Pain Location Shoulder    Pain Orientation Left    Pain Descriptors / Indicators Aching    Pain Type Chronic pain    Pain Onset More than a month ago    Pain Frequency Constant               Manual:  Supine:  Passive L shoulder ROM with multipoint holds for tension reduction: Flexion 10x 20 second holds,abduction 10x 20 second holds  Glenohumeral AP, PA mobilizations grade II with IR/ER bias, 4x 15 seconds for pain reduction.   scapular retraction and depression with overpressure 10x 10 second holds Distraction between interventions for pain relief multiple trials x 10 seconds  Therex: cues for guided motion  Supine: - AAROM flexion 15x 10 second hold with 2lb bar -bench press with 2lb bar 10x ; cues for keeping elbows to side  - AROM flexion 10x 3 second holds cues for thumb up in the air ; 2lb dumbbell flexion 10x  - AROM PNF with PT assistance D1 pattern 10x, D2 pattern 10x; 2lb dumbbell - Scapular punches modified LUE 10x ; very challenging for patient; 2lb dumbbell  - Stabilization intervention against perturbation 3x20 seconds     Sidelying: ER AROM 15x with PT stabilization for alignment 2lb dumbbell;  Abduction 15x with PT stabilization for alignment    Seated: -Scapular retraction/row 15x with RTB -ER/IR AROM  RTB 15x with cues for stabilization to side ; x 2 sets  GTB row 15x  2lb  bar: chess press with focus on scapular retraction 10x 2lb bar straight arm raise to chest height 10x    Standing: Reach into cabinet overhead and place 5 cones up/down from shelf x 2 trials.  Modified wall pushup 10x. Focus on scapular push with a plus   Pt educated throughout session about proper posture and technique with exercises. Improved exercise technique, movement at target joints, use of target muscles after min to mod verbal, visual, tactile cues   Patient remains highly motivated despite fatigue this session. Tone continues to be area of limitation with limited end range flexion and abduction. His scapular control is gradually  improving but continues to be limited. Pt will continue to benefit from skilled therapy to address remaining deficits and will continue with current POC.                       PT Education - 05/09/20 1128    Education Details exercise technique, body mechanics    Person(s) Educated Patient    Methods Explanation;Demonstration;Tactile cues;Verbal cues    Comprehension Verbalized understanding;Returned demonstration;Verbal cues required;Tactile cues required            PT Short Term Goals - 04/05/20 1314      PT SHORT TERM GOAL #1   Title After 2 weeks pt will demontrate improve P/ROM shoulder flexion 100 degrees FF as per protocol.    Baseline 85 degrees 1/31: 93 degrees 3/3: 112 degrees    Time 2    Period Weeks    Status Achieved    Target Date 03/19/20      PT SHORT TERM GOAL #2   Title After 2 weeks pt will demonstrate correct performance of HEP for P/ROM of shoulder, and AA/ROM of elbow, wrist, and hand.    Baseline Issued at evlauation 1/31: HEP progressed 3/3: HEP compliant    Time 2    Period Weeks    Status Partially Met    Target Date 04/19/20             PT Long Term Goals - 04/05/20 1122      PT LONG TERM GOAL #1   Title After 8 weeks pt will demonstrate improved daily function AEB FOTO: >43.     Baseline 22 at evaluation. 1/31: 50% 3/3 58%    Time 8    Period Weeks    Status Achieved      PT LONG TERM GOAL #2   Title After 8 weeks pt will demonstrate Left shoulder flexion >135 degrees; MMT flexion 4/5, ABDCT: 5/5, elbow flexion: 5/5.    Baseline not assessed at eval 1/31: flexion arom 91, MMT 2+/5  3/3: supine AROM 112 degrees MMT 2+    Time 8    Period Weeks    Status Partially Met    Target Date 05/31/20      PT LONG TERM GOAL #3   Title Patient will decrease Quick DASH score by > 8 points (32%)  demonstrating reduced self-reported upper extremity disability.    Baseline 3/3/: 40.9%    Time 8    Period Weeks    Status New    Target Date 05/31/20                 Plan - 05/09/20 1131    Clinical Impression Statement Patient remains highly motivated despite fatigue this session. Tone continues to be area of limitation with limited end range flexion and abduction. His scapular control is gradually  improving but continues to be limited. Pt will continue to benefit from skilled therapy to address remaining deficits and will continue with current POC.    Personal Factors and Comorbidities Age    Comorbidities HTN, Hx CVA    Examination-Activity Limitations Stand;Bathing;Bed Mobility;Reach Overhead;Carry;Sleep    Examination-Participation Restrictions Driving;Yard Work;Meal Prep    Stability/Clinical Decision Making Stable/Uncomplicated    Rehab Potential Good    PT Frequency 2x / week    PT Duration 8 weeks    PT Treatment/Interventions Patient/family education;Balance training;Neuromuscular re-education;Therapeutic exercise;Stair training;Moist Heat;Functional mobility training;Therapeutic activities;Dry needling;Passive range of motion    PT Next Visit Plan Review HEP, continue with P/ROM, begin scapular strength training    PT Home Exercise Plan No updates today.    Consulted and Agree with Plan of Care Patient           Patient will benefit from skilled  therapeutic intervention in order to improve the following deficits and impairments:  Decreased activity tolerance,Decreased strength,Difficulty walking,Decreased balance,Decreased coordination,Decreased mobility,Decreased endurance  Visit Diagnosis: Stiffness of left shoulder, not elsewhere classified  Hemiparesis affecting left side as late effect of stroke (HCC)  Muscle weakness (generalized)  Other lack of coordination     Problem List Patient Active Problem List   Diagnosis Date Noted  . History of stroke with residual effects 11/17/2019  . Right shoulder pain 10/25/2019  . Weakness 10/25/2019  . Decreased GFR 08/15/2019  . Dyslipidemia 08/15/2019  . AKI (acute kidney injury) ([place]) 08/15/2019  . Hemiparesis affecting left side as late effect of stroke ([place]) 08/15/2019  . Carotid stenosis 07/29/2019  . Carotid stenosis, right 07/29/2019  . Carotid artery plaque, right 07/19/2019  . Hx of completed stroke 07/19/2019  . Stroke ([place]) 07/06/2019  . TIA (transient ischemic attack) 07/05/2019  . Hypokalemia 07/05/2019  . GERD without esophagitis 06/18/2017  . Essential hypertension 08/19/2016  . Vertigo 12/05/2015  . Adenomatous colon polyp 01/31/2014  . Basal cell carcinoma 09/23/2013  . Hay fever 09/23/2013  . Vitamin D deficiency 09/23/2013  . Gastric catarrh 11/28/2011  . Hyperlipidemia, mixed 11/28/2011  . Preop cardiovascular exam 11/28/2011   Janna Arch, PT, DPT   05/09/2020, 11:59 AM  [place] MAIN [facility] SERVICES [address] [place], [place], [address] Phone: [phone]   Fax:  [phone]  Name: Russell Herman MRN: [id number] Date of Birth: 07-15-1945

## 2020-05-14 ENCOUNTER — Ambulatory Visit: Payer: Medicare Other

## 2020-05-14 ENCOUNTER — Other Ambulatory Visit: Payer: Self-pay

## 2020-05-14 DIAGNOSIS — M25612 Stiffness of left shoulder, not elsewhere classified: Secondary | ICD-10-CM

## 2020-05-14 DIAGNOSIS — M6281 Muscle weakness (generalized): Secondary | ICD-10-CM

## 2020-05-14 NOTE — Therapy (Signed)
[place] MAIN [facility] SERVICES [address] [place], [place], [address] Phone: [phone]   Fax:  [phone]  Physical Therapy Treatment  Patient Details  Name: Russell Herman MRN: [id number] Date of Birth: August 01, 1945 Referring Provider (PT): Leim Fabry MD (Orthopedics)   Encounter Date: 05/14/2020   PT End of Session - 05/14/20 1123    Visit Number 29    Number of Visits 34    Date for PT Re-Evaluation 05/31/20    Authorization Type 9/10 PN 04/11/20    Authorization Time Period 01/13/20-04/06/20; 04/05/20-05/31/20    PT Start Time 1115    PT Stop Time 1200    PT Time Calculation (min) 45 min    Activity Tolerance Patient tolerated treatment well;No increased pain    Behavior During Therapy WFL for tasks assessed/performed           Past Medical History:  Diagnosis Date  . Adenomatous colon polyp   . Allergy   . Arthritis   . Cancer ([place])    basal cell skin  . Carotid stenosis   . Coronary artery disease   . GERD (gastroesophageal reflux disease)   . HLD (hyperlipidemia)   . Hypertension   . Pre-diabetes   . Stroke ([place])   . Wears hearing aid in both ears     Past Surgical History:  Procedure Laterality Date  . cyst removed    . ENDARTERECTOMY Right 08/04/2019   Procedure: ENDARTERECTOMY CAROTID;  Surgeon: Algernon Huxley, MD;  Location: [facility];  Service: Vascular;  Laterality: Right;  . EXCISION OF TONGUE LESION Bilateral 05/05/2019   Procedure: EXCISION OF DORSAL TONGUE LESION;  Surgeon: Margaretha Sheffield, MD;  Location: [place];  Service: ENT;  Laterality: Bilateral;  . SHOULDER ARTHROSCOPY WITH ROTATOR CUFF REPAIR AND SUBACROMIAL DECOMPRESSION Left 01/09/2020   Procedure: Left shoulder arthroscopic  rotator cuff repair with Regeneten patch, subacromial decompression, and biceps tenodesis;  Surgeon: Leim Fabry, MD;  Location: [facility];  Service: Orthopedics;  Laterality: Left;  . skin cancer removed      There were no  vitals filed for this visit.   Subjective Assessment - 05/14/20 1117    Subjective Pt reports he is doing well today with the nicer weather.  Pt reports that he was having some difficulty yesterday due to the colder weather.    Pertinent History Right lateral thalamic infarct mild ataxia and mild hemiparesis. Patient went to rehab 07/07/19-07/15/19. Then he had surgery 7/1 for  s/p successful right CEA    How long can you sit comfortably? not related    How long can you stand comfortably? not related    How long can you walk comfortably? not related    Currently in Pain? Yes    Pain Score 2     Pain Location Shoulder    Pain Orientation Left    Pain Descriptors / Indicators Aching    Pain Type Chronic pain    Pain Onset More than a month ago              Manual:  Supine:  Passive L shoulder ROM with multipoint holds for tension reduction: Flexion 10x 20 second holds,abduction 10x 20 second holds  Glenohumeral AP, PA mobilizations grade II with IR/ER bias, 4x 15 seconds for pain reduction.   scapular retraction and depression with overpressure 10x 10 second holds Distraction between interventions for pain relief multiple trials x 10 seconds     Therex: cues for  guided motion  Supine: - AAROM flexion 15x 10 second hold with 2lb bar -bench press with 2lb bar 10x ; cues for keeping elbows to side  - AROM flexion 10x 3 second holds cues for thumb up in the air ; 2lb dumbbell flexion 10x  - AROM PNF with PT assistance D1 pattern 10x, D2 pattern 10x; 2lb dumbbell - Scapular punches modified LUE 10x ; pt describes to have medium difficulty with technique - Stabilization intervention against perturbation x20 seconds; pt reports easy difficulty      Seated: -Scapular retraction/row 15x with RTB -ER/IR AROM  RTB 15x with cues for stabilization to side ; x 2 sets  GTB row 15x -Knee to Hip slides for increasing extension and IR of shoulder, x10 -Knee to back of head for ER, x10 2lb  bar: chest press with focus on scapular retraction 10x 2lb bar straight arm raise to head height 10x   Standing: Reach into cabinet overhead and place 5 cones up/down from shelf x 2 trials.  Second trial with use of 1/4# cuff weight around wrist Modified wall pushup 10x. Focus on scapular push with a plus    Pt educated throughout session about proper posture and technique with exercises. Improved exercise technique, movement at target joints, use of target muscles after min to mod verbal, visual, tactile cues                          PT Education - 05/14/20 1122    Education Details exercise technique, body mechanics    Person(s) Educated Patient    Methods Explanation;Demonstration;Tactile cues;Verbal cues    Comprehension Verbalized understanding;Returned demonstration;Verbal cues required;Tactile cues required            PT Short Term Goals - 04/05/20 1314      PT SHORT TERM GOAL #1   Title After 2 weeks pt will demontrate improve P/ROM shoulder flexion 100 degrees FF as per protocol.    Baseline 85 degrees 1/31: 93 degrees 3/3: 112 degrees    Time 2    Period Weeks    Status Achieved    Target Date 03/19/20      PT SHORT TERM GOAL #2   Title After 2 weeks pt will demonstrate correct performance of HEP for P/ROM of shoulder, and AA/ROM of elbow, wrist, and hand.    Baseline Issued at evlauation 1/31: HEP progressed 3/3: HEP compliant    Time 2    Period Weeks    Status Partially Met    Target Date 04/19/20             PT Long Term Goals - 04/05/20 1122      PT LONG TERM GOAL #1   Title After 8 weeks pt will demonstrate improved daily function AEB FOTO: >43.    Baseline 22 at evaluation. 1/31: 50% 3/3 58%    Time 8    Period Weeks    Status Achieved      PT LONG TERM GOAL #2   Title After 8 weeks pt will demonstrate Left shoulder flexion >135 degrees; MMT flexion 4/5, ABDCT: 5/5, elbow flexion: 5/5.    Baseline not assessed at eval 1/31:  flexion arom 91, MMT 2+/5 3/3: supine AROM 112 degrees MMT 2+    Time 8    Period Weeks    Status Partially Met    Target Date 05/31/20      PT LONG TERM GOAL #3  Title Patient will decrease Quick DASH score by > 8 points (32%)  demonstrating reduced self-reported upper extremity disability.    Baseline 3/3/: 40.9%    Time 8    Period Weeks    Status New    Target Date 05/31/20                 Plan - 05/14/20 1137    Clinical Impression Statement Pt remains motivated throughout physical therapy session.  Pt is still limited by tone in the L UE, however is making significant improvements with scapular control and ability to perform exercises with decreased verbal and tactile cuing.  Pt has self-repored difficulty with wall slides during abduction.  Pt will continue to benfit from skilled therapy to address remaining deficits and will continue with current POC.    Personal Factors and Comorbidities Age    Comorbidities HTN, Hx CVA    Examination-Activity Limitations Stand;Bathing;Bed Mobility;Reach Overhead;Carry;Sleep    Examination-Participation Restrictions Driving;Yard Work;Meal Prep    Stability/Clinical Decision Making Stable/Uncomplicated    Rehab Potential Good    PT Frequency 2x / week    PT Duration 8 weeks    PT Treatment/Interventions Patient/family education;Balance training;Neuromuscular re-education;Therapeutic exercise;Stair training;Moist Heat;Functional mobility training;Therapeutic activities;Dry needling;Passive range of motion    PT Next Visit Plan Review HEP, continue with P/ROM, begin scapular strength training    PT Home Exercise Plan No updates today.    Consulted and Agree with Plan of Care Patient           Patient will benefit from skilled therapeutic intervention in order to improve the following deficits and impairments:  Decreased activity tolerance,Decreased strength,Difficulty walking,Decreased balance,Decreased coordination,Decreased  mobility,Decreased endurance  Visit Diagnosis: Stiffness of left shoulder, not elsewhere classified  Muscle weakness (generalized)     Problem List Patient Active Problem List   Diagnosis Date Noted  . History of stroke with residual effects 11/17/2019  . Right shoulder pain 10/25/2019  . Weakness 10/25/2019  . Decreased GFR 08/15/2019  . Dyslipidemia 08/15/2019  . AKI (acute kidney injury) ([place]) 08/15/2019  . Hemiparesis affecting left side as late effect of stroke ([place]) 08/15/2019  . Carotid stenosis 07/29/2019  . Carotid stenosis, right 07/29/2019  . Carotid artery plaque, right 07/19/2019  . Hx of completed stroke 07/19/2019  . Stroke ([place]) 07/06/2019  . TIA (transient ischemic attack) 07/05/2019  . Hypokalemia 07/05/2019  . GERD without esophagitis 06/18/2017  . Essential hypertension 08/19/2016  . Vertigo 12/05/2015  . Adenomatous colon polyp 01/31/2014  . Basal cell carcinoma 09/23/2013  . Hay fever 09/23/2013  . Vitamin D deficiency 09/23/2013  . Gastric catarrh 11/28/2011  . Hyperlipidemia, mixed 11/28/2011  . Preop cardiovascular exam 11/28/2011    Christie Nottingham 05/14/2020, 12:14 PM  [place] MAIN [facility] SERVICES [address] [place], [place], [address] Phone: [phone]   Fax:  [phone]  Name: Russell Herman MRN: [id number] Date of Birth: 12-26-1945  [place] MAIN [facility] SERVICES [address] [place], [place], [address] Phone: [phone]   Fax:  [phone]  Physical Therapy Treatment  Patient Details  Name: Russell Herman MRN: [id number] Date of Birth: 1945/06/18 Referring Provider (PT): Leim Fabry MD (Orthopedics)   Encounter Date: 05/14/2020   PT End of Session - 05/14/20 1123    Visit Number 29    Number of Visits 34    Date for PT Re-Evaluation 05/31/20    Authorization Type 9/10 PN 04/11/20  Authorization Time Period 01/13/20-04/06/20;  04/05/20-05/31/20    PT Start Time 1115    PT Stop Time 1200    PT Time Calculation (min) 45 min    Activity Tolerance Patient tolerated treatment well;No increased pain    Behavior During Therapy WFL for tasks assessed/performed           Past Medical History:  Diagnosis Date  . Adenomatous colon polyp   . Allergy   . Arthritis   . Cancer ([place])    basal cell skin  . Carotid stenosis   . Coronary artery disease   . GERD (gastroesophageal reflux disease)   . HLD (hyperlipidemia)   . Hypertension   . Pre-diabetes   . Stroke ([place])   . Wears hearing aid in both ears     Past Surgical History:  Procedure Laterality Date  . cyst removed    . ENDARTERECTOMY Right 08/04/2019   Procedure: ENDARTERECTOMY CAROTID;  Surgeon: Algernon Huxley, MD;  Location: [facility];  Service: Vascular;  Laterality: Right;  . EXCISION OF TONGUE LESION Bilateral 05/05/2019   Procedure: EXCISION OF DORSAL TONGUE LESION;  Surgeon: Margaretha Sheffield, MD;  Location: [place];  Service: ENT;  Laterality: Bilateral;  . SHOULDER ARTHROSCOPY WITH ROTATOR CUFF REPAIR AND SUBACROMIAL DECOMPRESSION Left 01/09/2020   Procedure: Left shoulder arthroscopic  rotator cuff repair with Regeneten patch, subacromial decompression, and biceps tenodesis;  Surgeon: Leim Fabry, MD;  Location: [facility];  Service: Orthopedics;  Laterality: Left;  . skin cancer removed      There were no vitals filed for this visit.   Subjective Assessment - 05/14/20 1117    Subjective Pt reports he is doing well today with the nicer weather.  Pt reports that he was having some difficulty yesterday due to the colder weather.    Pertinent History Right lateral thalamic infarct mild ataxia and mild hemiparesis. Patient went to rehab 07/07/19-07/15/19. Then he had surgery 7/1 for  s/p successful right CEA    How long can you sit comfortably? not related    How long can you stand comfortably? not related    How long can you walk comfortably? not  related    Currently in Pain? Yes    Pain Score 2     Pain Location Shoulder    Pain Orientation Left    Pain Descriptors / Indicators Aching    Pain Type Chronic pain    Pain Onset More than a month ago                                     PT Education - 05/14/20 1122    Education Details exercise technique, body mechanics    Person(s) Educated Patient    Methods Explanation;Demonstration;Tactile cues;Verbal cues    Comprehension Verbalized understanding;Returned demonstration;Verbal cues required;Tactile cues required            PT Short Term Goals - 04/05/20 1314      PT SHORT TERM GOAL #1   Title After 2 weeks pt will demontrate improve P/ROM shoulder flexion 100 degrees FF as per protocol.    Baseline 85 degrees 1/31: 93 degrees 3/3: 112 degrees    Time 2    Period Weeks    Status Achieved    Target Date 03/19/20      PT SHORT TERM GOAL #2   Title After 2 weeks pt will demonstrate correct performance  of HEP for P/ROM of shoulder, and AA/ROM of elbow, wrist, and hand.    Baseline Issued at evlauation 1/31: HEP progressed 3/3: HEP compliant    Time 2    Period Weeks    Status Partially Met    Target Date 04/19/20             PT Long Term Goals - 04/05/20 1122      PT LONG TERM GOAL #1   Title After 8 weeks pt will demonstrate improved daily function AEB FOTO: >43.    Baseline 22 at evaluation. 1/31: 50% 3/3 58%    Time 8    Period Weeks    Status Achieved      PT LONG TERM GOAL #2   Title After 8 weeks pt will demonstrate Left shoulder flexion >135 degrees; MMT flexion 4/5, ABDCT: 5/5, elbow flexion: 5/5.    Baseline not assessed at eval 1/31: flexion arom 91, MMT 2+/5 3/3: supine AROM 112 degrees MMT 2+    Time 8    Period Weeks    Status Partially Met    Target Date 05/31/20      PT LONG TERM GOAL #3   Title Patient will decrease Quick DASH score by > 8 points (32%)  demonstrating reduced self-reported upper extremity  disability.    Baseline 3/3/: 40.9%    Time 8    Period Weeks    Status New    Target Date 05/31/20                 Plan - 05/14/20 1137    Clinical Impression Statement Pt remains motivated throughout physical therapy session.  Pt is still limited by tone in the L UE, however is making significant improvements with scapular control and ability to perform exercises with decreased verbal and tactile cuing.  Pt has self-repored difficulty with wall slides during abduction.  Pt will continue to benfit from skilled therapy to address remaining deficits and will continue with current POC.    Personal Factors and Comorbidities Age    Comorbidities HTN, Hx CVA    Examination-Activity Limitations Stand;Bathing;Bed Mobility;Reach Overhead;Carry;Sleep    Examination-Participation Restrictions Driving;Yard Work;Meal Prep    Stability/Clinical Decision Making Stable/Uncomplicated    Rehab Potential Good    PT Frequency 2x / week    PT Duration 8 weeks    PT Treatment/Interventions Patient/family education;Balance training;Neuromuscular re-education;Therapeutic exercise;Stair training;Moist Heat;Functional mobility training;Therapeutic activities;Dry needling;Passive range of motion    PT Next Visit Plan Review HEP, continue with P/ROM, begin scapular strength training    PT Home Exercise Plan No updates today.    Consulted and Agree with Plan of Care Patient           Patient will benefit from skilled therapeutic intervention in order to improve the following deficits and impairments:  Decreased activity tolerance,Decreased strength,Difficulty walking,Decreased balance,Decreased coordination,Decreased mobility,Decreased endurance  Visit Diagnosis: Stiffness of left shoulder, not elsewhere classified  Muscle weakness (generalized)     Problem List Patient Active Problem List   Diagnosis Date Noted  . History of stroke with residual effects 11/17/2019  . Right shoulder pain  10/25/2019  . Weakness 10/25/2019  . Decreased GFR 08/15/2019  . Dyslipidemia 08/15/2019  . AKI (acute kidney injury) ([place]) 08/15/2019  . Hemiparesis affecting left side as late effect of stroke ([place]) 08/15/2019  . Carotid stenosis 07/29/2019  . Carotid stenosis, right 07/29/2019  . Carotid artery plaque, right 07/19/2019  . Hx of completed stroke 07/19/2019  .  Stroke ([place]) 07/06/2019  . TIA (transient ischemic attack) 07/05/2019  . Hypokalemia 07/05/2019  . GERD without esophagitis 06/18/2017  . Essential hypertension 08/19/2016  . Vertigo 12/05/2015  . Adenomatous colon polyp 01/31/2014  . Basal cell carcinoma 09/23/2013  . Hay fever 09/23/2013  . Vitamin D deficiency 09/23/2013  . Gastric catarrh 11/28/2011  . Hyperlipidemia, mixed 11/28/2011  . Preop cardiovascular exam 11/28/2011    Gwenlyn Saran, PT, DPT 05/14/20, 12:14 PM    [place] MAIN [facility] SERVICES [address] [place], [place], [address] Phone: [phone]   Fax:  [phone]  Name: Russell Herman MRN: [id number] Date of Birth: 24-Mar-1945

## 2020-05-16 ENCOUNTER — Ambulatory Visit: Payer: Medicare Other

## 2020-05-16 ENCOUNTER — Other Ambulatory Visit: Payer: Self-pay

## 2020-05-16 DIAGNOSIS — R278 Other lack of coordination: Secondary | ICD-10-CM

## 2020-05-16 DIAGNOSIS — M6281 Muscle weakness (generalized): Secondary | ICD-10-CM

## 2020-05-16 DIAGNOSIS — M25612 Stiffness of left shoulder, not elsewhere classified: Secondary | ICD-10-CM | POA: Diagnosis not present

## 2020-05-16 DIAGNOSIS — I69354 Hemiplegia and hemiparesis following cerebral infarction affecting left non-dominant side: Secondary | ICD-10-CM

## 2020-05-16 NOTE — Therapy (Signed)
Russell Herman MAIN [facility] SERVICES [address] [place], [place], [address] Phone: [phone]   Fax:  [phone]  Physical Therapy Treatment Physical Therapy Progress Note   Dates of reporting period  04/11/20  to  05/16/20  Patient Details  Name: Russell Herman MRN: [id number] Date of Birth: August 16, 1945 Referring Provider (PT): Leim Fabry MD (Orthopedics)   Encounter Date: 05/16/2020   PT End of Session - 05/16/20 1113    Visit Number 30    Number of Visits 34    Date for PT Re-Evaluation 05/31/20    Authorization Type 10/10 PN 04/11/20; next session 1/10 Pn 4/13    Authorization Time Period 01/13/20-04/06/20; 04/05/20-05/31/20    PT Start Time 1115    PT Stop Time 1159    PT Time Calculation (min) 44 min    Activity Tolerance Patient tolerated treatment well;No increased pain    Behavior During Therapy WFL for tasks assessed/performed           Past Medical History:  Diagnosis Date  . Adenomatous colon polyp   . Allergy   . Arthritis   . Cancer ([place])    basal cell skin  . Carotid stenosis   . Coronary artery disease   . GERD (gastroesophageal reflux disease)   . HLD (hyperlipidemia)   . Hypertension   . Pre-diabetes   . Stroke ([place])   . Wears hearing aid in both ears     Past Surgical History:  Procedure Laterality Date  . cyst removed    . ENDARTERECTOMY Right 08/04/2019   Procedure: ENDARTERECTOMY CAROTID;  Surgeon: Algernon Huxley, MD;  Location: [facility];  Service: Vascular;  Laterality: Right;  . EXCISION OF TONGUE LESION Bilateral 05/05/2019   Procedure: EXCISION OF DORSAL TONGUE LESION;  Surgeon: Margaretha Sheffield, MD;  Location: [facility];  Service: ENT;  Laterality: Bilateral;  . SHOULDER ARTHROSCOPY WITH ROTATOR CUFF REPAIR AND SUBACROMIAL DECOMPRESSION Left 01/09/2020   Procedure: Left shoulder arthroscopic  rotator cuff repair with Regeneten patch, subacromial decompression, and biceps tenodesis;  Surgeon: Leim Fabry,  MD;  Location: [facility];  Service: Orthopedics;  Laterality: Left;  . skin cancer removed      There were no vitals filed for this visit.   Subjective Assessment - 05/16/20 1121    Subjective Patient reports he is doing well but the pain is remaining the same. No falls or LOB since last session.    Pertinent History Right lateral thalamic infarct mild ataxia and mild hemiparesis. Patient went to rehab 07/07/19-07/15/19. Then he had surgery 7/1 for  s/p successful right CEA    How long can you sit comfortably? not related    How long can you stand comfortably? not related    How long can you walk comfortably? not related    Currently in Pain? Yes    Pain Score 2     Pain Location Shoulder    Pain Orientation Left    Pain Descriptors / Indicators Aching    Pain Type Chronic pain    Pain Onset More than a month ago    Pain Frequency Intermittent               Goals:  FOTO: 56%  L shoulder flexion /abduction Seated Flexion: 108 Seated Abduction: 113  Supine:  Flexion: 119  abduction : 114 ER:27  IR: [facility]  quickdash : 38.6%    Manual:  Supine:  Passive L shoulder ROM with multipoint holds for  tension reduction: Flexion 10x 20 second holds,abduction 10x 20 second holds  Glenohumeral AP, PA mobilizations grade II with IR/ER bias, 4x 15 seconds for pain reduction.   scapular retraction and depression with overpressure 10x 10 second holds Distraction between interventions for pain relief multiple trials x 10 seconds      Therex: cues for guided motion  Supine: - AAROM flexion 15x 10 second hold with 2lb bar -bench press with 2lb bar 10x ; cues for keeping elbows to side  - AROM flexion 10x 3 second holds cues for thumb up in the air ; 2lb dumbbell flexion 10x  - AROM PNF with PT assistance D1 pattern 10x, D2 pattern 10x; 2lb dumbbell - Scapular punches modified LUE 10x ; pt describes to have medium difficulty with technique - Stabilization intervention against  perturbation x20 seconds; pt reports easy difficulty  2.5 weighted bar chest press 15     Seated: -Scapular retraction/row 15x with RTB -ER/IR AROM  RTB 15x with cues for stabilization to side ; x 2 sets  GTB row 15x -Knee to Hip slides for increasing extension and IR of shoulder, x10 -Knee to back of head for ER, x10 2.5lb bar: row with focus on scapular retraction 10x 2.5lb bar straight arm raise to head height 10x     Standing: Reach into cabinet overhead and place 5 cones up/down from shelf x 2 trials.  Second trial with use of 1/4# cuff weight around wrist Modified wall pushup 10x. Focus on scapular push with a plus    Pt educated throughout session about proper posture and technique with exercises. Improved exercise technique, movement at target joints, use of target muscles after min to mod verbal, visual, tactile cues     Patient's condition has the potential to improve in response to therapy. Maximum improvement is yet to be obtained. The anticipated improvement is attainable and reasonable in a generally predictable time.  Patient reports his range of motion is improving but the pain is staying the same.    Patient demonstrates progress with functional range of motion in gravity eliminated and gravity positions. His internal rotation preferences limit his external rotation at this time. Pain continues to be at a steady 2/10 despite pain reduction techniques. Tone is throughout LUE and is dependent upon fatigue for amount of presence felt in session. Patient's condition has the potential to improve in response to therapy. Maximum improvement is yet to be obtained. The anticipated improvement is attainable and reasonable in a generally predictable time.  Pt will continue to benefit from skilled therapy to address remaining deficits and will continue with current POC.                     PT Education - 05/16/20 1112    Education Details goals, exercise technique     Person(s) Educated Patient    Methods Explanation;Demonstration;Tactile cues;Verbal cues    Comprehension Verbalized understanding;Returned demonstration;Verbal cues required;Tactile cues required            PT Short Term Goals - 04/05/20 1314      PT SHORT TERM GOAL #1   Title After 2 weeks pt will demontrate improve P/ROM shoulder flexion 100 degrees FF as per protocol.    Baseline 85 degrees 1/31: 93 degrees 3/3: 112 degrees    Time 2    Period Weeks    Status Achieved    Target Date 03/19/20      PT SHORT TERM GOAL #2  Title After 2 weeks pt will demonstrate correct performance of HEP for P/ROM of shoulder, and AA/ROM of elbow, wrist, and hand.    Baseline Issued at evlauation 1/31: HEP progressed 3/3: HEP compliant    Time 2    Period Weeks    Status Partially Met    Target Date 04/19/20             PT Long Term Goals - 05/16/20 1124      PT LONG TERM GOAL #1   Title After 8 weeks pt will demonstrate improved daily function AEB FOTO: >43.    Baseline 22 at evaluation. 1/31: 50% 3/3 58% 4/13: 56%    Time 8    Period Weeks    Status Achieved      PT LONG TERM GOAL #2   Title After 8 weeks pt will demonstrate Left shoulder flexion >135 degrees; MMT flexion 4/5, ABDCT: 5/5, elbow flexion: 5/5.    Baseline not assessed at eval 1/31: flexion arom 91, MMT 2+/5 3/3: supine AROM 112 degrees MMT 2+ 4/13: seated flexion 108, abduction 113; see note for supine    Time 8    Period Weeks    Status Partially Met    Target Date 05/31/20      PT LONG TERM GOAL #3   Title Patient will decrease Quick DASH score by > 8 points (32%)  demonstrating reduced self-reported upper extremity disability.    Baseline 3/3/: 40.9% 4/13: 38.6%    Time 8    Period Weeks    Status Partially Met    Target Date 05/31/20                 Plan - 05/16/20 1147    Clinical Impression Statement Patient demonstrates progress with functional range of motion in gravity eliminated and  gravity positions. His internal rotation preferences limit his external rotation at this time. Pain continues to be at a steady 2/10 despite pain reduction techniques. Tone is throughout LUE and is dependent upon fatigue for amount of presence felt in session. Patient's condition has the potential to improve in response to therapy. Maximum improvement is yet to be obtained. The anticipated improvement is attainable and reasonable in a generally predictable time.  Pt will continue to benefit from skilled therapy to address remaining deficits and will continue with current POC.    Personal Factors and Comorbidities Age    Comorbidities HTN, Hx CVA    Examination-Activity Limitations Stand;Bathing;Bed Mobility;Reach Overhead;Carry;Sleep    Examination-Participation Restrictions Driving;Yard Work;Meal Prep    Stability/Clinical Decision Making Stable/Uncomplicated    Rehab Potential Good    PT Frequency 2x / week    PT Duration 8 weeks    PT Treatment/Interventions Patient/family education;Balance training;Neuromuscular re-education;Therapeutic exercise;Stair training;Moist Heat;Functional mobility training;Therapeutic activities;Dry needling;Passive range of motion    PT Next Visit Plan Review HEP, continue with P/ROM, begin scapular strength training    PT Home Exercise Plan No updates today.    Consulted and Agree with Plan of Care Patient           Patient will benefit from skilled therapeutic intervention in order to improve the following deficits and impairments:  Decreased activity tolerance,Decreased strength,Difficulty walking,Decreased balance,Decreased coordination,Decreased mobility,Decreased endurance  Visit Diagnosis: Stiffness of left shoulder, not elsewhere classified  Muscle weakness (generalized)  Hemiparesis affecting left side as late effect of stroke [facility])  Other lack of coordination     Problem List Patient Active Problem List   Diagnosis Date Noted  .  History of  stroke with residual effects 11/17/2019  . Right shoulder pain 10/25/2019  . Weakness 10/25/2019  . Decreased GFR 08/15/2019  . Dyslipidemia 08/15/2019  . AKI (acute kidney injury) ([place]) 08/15/2019  . Hemiparesis affecting left side as late effect of stroke ([place]) 08/15/2019  . Carotid stenosis 07/29/2019  . Carotid stenosis, right 07/29/2019  . Carotid artery plaque, right 07/19/2019  . Hx of completed stroke 07/19/2019  . Stroke ([place]) 07/06/2019  . TIA (transient ischemic attack) 07/05/2019  . Hypokalemia 07/05/2019  . GERD without esophagitis 06/18/2017  . Essential hypertension 08/19/2016  . Vertigo 12/05/2015  . Adenomatous colon polyp 01/31/2014  . Basal cell carcinoma 09/23/2013  . Hay fever 09/23/2013  . Vitamin D deficiency 09/23/2013  . Gastric catarrh 11/28/2011  . Hyperlipidemia, mixed 11/28/2011  . Preop cardiovascular exam 11/28/2011   Janna Arch, PT, DPT   05/16/2020, 12:00 PM  [place] MAIN [facility] SERVICES [address] [place], [place], [address] Phone: [phone]   Fax:  [phone]  Name: BHARATH BERNSTEIN MRN: [id number] Date of Birth: 1945-02-24

## 2020-05-21 ENCOUNTER — Other Ambulatory Visit: Payer: Self-pay

## 2020-05-21 ENCOUNTER — Ambulatory Visit: Payer: Medicare Other

## 2020-05-21 DIAGNOSIS — M25612 Stiffness of left shoulder, not elsewhere classified: Secondary | ICD-10-CM

## 2020-05-21 DIAGNOSIS — I69354 Hemiplegia and hemiparesis following cerebral infarction affecting left non-dominant side: Secondary | ICD-10-CM

## 2020-05-21 DIAGNOSIS — M6281 Muscle weakness (generalized): Secondary | ICD-10-CM

## 2020-05-21 NOTE — Therapy (Signed)
[place] MAIN [facility] SERVICES [address] [place], [place], [address] Phone: [phone]   Fax:  [phone]  Physical Therapy Treatment  Patient Details  Name: Russell Herman MRN: [id number] Date of Birth: 07-Oct-1945 Referring Provider (PT): Leim Fabry MD (Orthopedics)   Encounter Date: 05/21/2020   PT End of Session - 05/21/20 1120    Visit Number 31    Number of Visits 34    Date for PT Re-Evaluation 05/31/20    Authorization Type 1/10 Pn 4/13    Authorization Time Period 01/13/20-04/06/20; 04/05/20-05/31/20    PT Start Time 1115    PT Stop Time 1159    PT Time Calculation (min) 44 min    Activity Tolerance Patient tolerated treatment well;No increased pain    Behavior During Therapy WFL for tasks assessed/performed           Past Medical History:  Diagnosis Date  . Adenomatous colon polyp   . Allergy   . Arthritis   . Cancer ([place])    basal cell skin  . Carotid stenosis   . Coronary artery disease   . GERD (gastroesophageal reflux disease)   . HLD (hyperlipidemia)   . Hypertension   . Pre-diabetes   . Stroke ([place])   . Wears hearing aid in both ears     Past Surgical History:  Procedure Laterality Date  . cyst removed    . ENDARTERECTOMY Right 08/04/2019   Procedure: ENDARTERECTOMY CAROTID;  Surgeon: Algernon Huxley, MD;  Location: [facility];  Service: Vascular;  Laterality: Right;  . EXCISION OF TONGUE LESION Bilateral 05/05/2019   Procedure: EXCISION OF DORSAL TONGUE LESION;  Surgeon: Margaretha Sheffield, MD;  Location: [facility];  Service: ENT;  Laterality: Bilateral;  . SHOULDER ARTHROSCOPY WITH ROTATOR CUFF REPAIR AND SUBACROMIAL DECOMPRESSION Left 01/09/2020   Procedure: Left shoulder arthroscopic  rotator cuff repair with Regeneten patch, subacromial decompression, and biceps tenodesis;  Surgeon: Leim Fabry, MD;  Location: [facility];  Service: Orthopedics;  Laterality: Left;  . skin cancer removed      There were no  vitals filed for this visit.   Subjective Assessment - 05/21/20 1118    Subjective Patient reports no falls or LOB since last session. Pain hasn't improved but ROM is increasing.    Pertinent History Right lateral thalamic infarct mild ataxia and mild hemiparesis. Patient went to rehab 07/07/19-07/15/19. Then he had surgery 7/1 for  s/p successful right CEA    How long can you sit comfortably? not related    How long can you stand comfortably? not related    How long can you walk comfortably? not related    Currently in Pain? Yes    Pain Score 2     Pain Location Shoulder    Pain Orientation Left    Pain Descriptors / Indicators Aching    Pain Type Chronic pain    Pain Onset More than a month ago    Pain Frequency Intermittent                 Manual:  Supine:  Passive L shoulder ROM with multipoint holds for tension reduction: Flexion 10x 20 second holds,abduction 10x 20 second holds  Glenohumeral AP, PA mobilizations grade II with IR/ER bias, 4x 15 seconds for pain reduction.   scapular retraction and depression with overpressure 10x 10 second holds Distraction between interventions for pain relief multiple trials x 10 seconds      Therex: cues for  guided motion  Supine: - AAROM flexion 15x 10 second hold with 2lb bar -bench press with 2lb bar 15x ; cues for keeping elbows to side  - AROM flexion 10x 3 second holds cues for thumb up in the air ; 2lb dumbbell flexion 10x  - AROM PNF with PT assistance D1 pattern 10x, D2 pattern 10x; 2lb dumbbell - Scapular punches modified LUE 10x ; pt describes to have medium difficulty with technique        Seated: -Scapular retraction/row 15x with RTB -ER/IR AROM  RTB 15x with cues for stabilization to side ; x 2 sets  GTB row 15x -Knee to Hip slides for increasing extension and IR of shoulder, x10 -Knee to back of head for ER, x10 2lb bar: chest press with focus on scapular retraction 15x 2lb bar straight arm raise to head height  10x     Standing: Reach into cabinet overhead and place 5 cones up/down from shelf x 2 trials.  Second trial with use of 1/4# cuff weight around wrist Modified wall pushup 10x. Focus on scapular push with a plus    Pt educated throughout session about proper posture and technique with exercises. Improved exercise technique, movement at target joints, use of target muscles after min to mod verbal, visual, tactile cues    Patient is highly motivated throughout session despite constant 2/10 pain. He is improving in functional range of motion and strength in available range. Increased repetition of tasks performed with fatigue towards end of set. Pt will continue to benfit from skilled therapy to address remaining deficits and will continue with current POC                   PT Education - 05/21/20 1119    Education Details exercise technique, body mechanics    Person(s) Educated Patient    Methods Explanation;Demonstration;Verbal cues;Tactile cues    Comprehension Verbalized understanding;Returned demonstration;Verbal cues required;Tactile cues required            PT Short Term Goals - 04/05/20 1314      PT SHORT TERM GOAL #1   Title After 2 weeks pt will demontrate improve P/ROM shoulder flexion 100 degrees FF as per protocol.    Baseline 85 degrees 1/31: 93 degrees 3/3: 112 degrees    Time 2    Period Weeks    Status Achieved    Target Date 03/19/20      PT SHORT TERM GOAL #2   Title After 2 weeks pt will demonstrate correct performance of HEP for P/ROM of shoulder, and AA/ROM of elbow, wrist, and hand.    Baseline Issued at evlauation 1/31: HEP progressed 3/3: HEP compliant    Time 2    Period Weeks    Status Partially Met    Target Date 04/19/20             PT Long Term Goals - 05/16/20 1124      PT LONG TERM GOAL #1   Title After 8 weeks pt will demonstrate improved daily function AEB FOTO: >43.    Baseline 22 at evaluation. 1/31: 50% 3/3 58% 4/13:  56%    Time 8    Period Weeks    Status Achieved      PT LONG TERM GOAL #2   Title After 8 weeks pt will demonstrate Left shoulder flexion >135 degrees; MMT flexion 4/5, ABDCT: 5/5, elbow flexion: 5/5.    Baseline not assessed at eval 1/31: flexion arom 91,  MMT 2+/5 3/3: supine AROM 112 degrees MMT 2+ 4/13: seated flexion 108, abduction 113; see note for supine    Time 8    Period Weeks    Status Partially Met    Target Date 05/31/20      PT LONG TERM GOAL #3   Title Patient will decrease Quick DASH score by > 8 points (32%)  demonstrating reduced self-reported upper extremity disability.    Baseline 3/3/: 40.9% 4/13: 38.6%    Time 8    Period Weeks    Status Partially Met    Target Date 05/31/20                 Plan - 05/21/20 1126    Clinical Impression Statement Patient is highly motivated throughout session despite constant 2/10 pain. He is improving in functional range of motion and strength in available range. Increased repetition of tasks performed with fatigue towards end of set. Pt will continue to benfit from skilled therapy to address remaining deficits and will continue with current POC    Personal Factors and Comorbidities Age    Comorbidities HTN, Hx CVA    Examination-Activity Limitations Stand;Bathing;Bed Mobility;Reach Overhead;Carry;Sleep    Examination-Participation Restrictions Driving;Yard Work;Meal Prep    Stability/Clinical Decision Making Stable/Uncomplicated    Rehab Potential Good    PT Frequency 2x / week    PT Duration 8 weeks    PT Treatment/Interventions Patient/family education;Balance training;Neuromuscular re-education;Therapeutic exercise;Stair training;Moist Heat;Functional mobility training;Therapeutic activities;Dry needling;Passive range of motion    PT Next Visit Plan Review HEP, continue with P/ROM, begin scapular strength training    PT Home Exercise Plan No updates today.    Consulted and Agree with Plan of Care Patient            Patient will benefit from skilled therapeutic intervention in order to improve the following deficits and impairments:  Decreased activity tolerance,Decreased strength,Difficulty walking,Decreased balance,Decreased coordination,Decreased mobility,Decreased endurance  Visit Diagnosis: Stiffness of left shoulder, not elsewhere classified  Muscle weakness (generalized)  Hemiparesis affecting left side as late effect of stroke [facility])     Problem List Patient Active Problem List   Diagnosis Date Noted  . History of stroke with residual effects 11/17/2019  . Right shoulder pain 10/25/2019  . Weakness 10/25/2019  . Decreased GFR 08/15/2019  . Dyslipidemia 08/15/2019  . AKI (acute kidney injury) ([place]) 08/15/2019  . Hemiparesis affecting left side as late effect of stroke ([place]) 08/15/2019  . Carotid stenosis 07/29/2019  . Carotid stenosis, right 07/29/2019  . Carotid artery plaque, right 07/19/2019  . Hx of completed stroke 07/19/2019  . Stroke ([place]) 07/06/2019  . TIA (transient ischemic attack) 07/05/2019  . Hypokalemia 07/05/2019  . GERD without esophagitis 06/18/2017  . Essential hypertension 08/19/2016  . Vertigo 12/05/2015  . Adenomatous colon polyp 01/31/2014  . Basal cell carcinoma 09/23/2013  . Hay fever 09/23/2013  . Vitamin D deficiency 09/23/2013  . Gastric catarrh 11/28/2011  . Hyperlipidemia, mixed 11/28/2011  . Preop cardiovascular exam 11/28/2011   Janna Arch, PT, DPT   05/21/2020, 12:51 PM  [place] MAIN [facility] SERVICES [address] [place], [place], [address] Phone: [phone]   Fax:  [phone]  Name: Russell Herman MRN: [id number] Date of Birth: 1945-09-14

## 2020-05-23 ENCOUNTER — Ambulatory Visit: Payer: Medicare Other

## 2020-05-23 ENCOUNTER — Other Ambulatory Visit: Payer: Self-pay

## 2020-05-23 DIAGNOSIS — I69354 Hemiplegia and hemiparesis following cerebral infarction affecting left non-dominant side: Secondary | ICD-10-CM

## 2020-05-23 DIAGNOSIS — M25612 Stiffness of left shoulder, not elsewhere classified: Secondary | ICD-10-CM

## 2020-05-23 DIAGNOSIS — M6281 Muscle weakness (generalized): Secondary | ICD-10-CM

## 2020-05-23 NOTE — Therapy (Signed)
[place] MAIN [facility] SERVICES [address] [place], [place], [address] Phone: [phone]   Fax:  [phone]  Physical Therapy Treatment  Patient Details  Name: Russell Herman MRN: [id number] Date of Birth: 1945-09-23 Referring Provider (PT): Leim Fabry MD (Orthopedics)   Encounter Date: 05/23/2020   PT End of Session - 05/23/20 1106    Visit Number 32    Number of Visits 34    Date for PT Re-Evaluation 05/31/20    Authorization Type 2/10 Pn 4/13    Authorization Time Period 01/13/20-04/06/20; 04/05/20-05/31/20    PT Start Time 1115    PT Stop Time 1159    PT Time Calculation (min) 44 min    Activity Tolerance Patient tolerated treatment well;No increased pain    Behavior During Therapy WFL for tasks assessed/performed           Past Medical History:  Diagnosis Date  . Adenomatous colon polyp   . Allergy   . Arthritis   . Cancer ([place])    basal cell skin  . Carotid stenosis   . Coronary artery disease   . GERD (gastroesophageal reflux disease)   . HLD (hyperlipidemia)   . Hypertension   . Pre-diabetes   . Stroke ([place])   . Wears hearing aid in both ears     Past Surgical History:  Procedure Laterality Date  . cyst removed    . ENDARTERECTOMY Right 08/04/2019   Procedure: ENDARTERECTOMY CAROTID;  Surgeon: Algernon Huxley, MD;  Location: [facility];  Service: Vascular;  Laterality: Right;  . EXCISION OF TONGUE LESION Bilateral 05/05/2019   Procedure: EXCISION OF DORSAL TONGUE LESION;  Surgeon: Margaretha Sheffield, MD;  Location: [place];  Service: ENT;  Laterality: Bilateral;  . SHOULDER ARTHROSCOPY WITH ROTATOR CUFF REPAIR AND SUBACROMIAL DECOMPRESSION Left 01/09/2020   Procedure: Left shoulder arthroscopic  rotator cuff repair with Regeneten patch, subacromial decompression, and biceps tenodesis;  Surgeon: Leim Fabry, MD;  Location: [facility];  Service: Orthopedics;  Laterality: Left;  . skin cancer removed      There were no  vitals filed for this visit.   Subjective Assessment - 05/23/20 1118    Subjective Patient reports his arm is slowly improving, pain continues to at 2 but is getting closer to 1 now.    Pertinent History Right lateral thalamic infarct mild ataxia and mild hemiparesis. Patient went to rehab 07/07/19-07/15/19. Then he had surgery 7/1 for  s/p successful right CEA    How long can you sit comfortably? not related    How long can you stand comfortably? not related    How long can you walk comfortably? not related    Currently in Pain? Yes    Pain Score 2     Pain Location Shoulder    Pain Orientation Left    Pain Descriptors / Indicators Aching    Pain Type Chronic pain    Pain Onset More than a month ago    Pain Frequency Intermittent                   Manual:  Supine:  Passive L shoulder ROM with multipoint holds for tension reduction: Flexion 10x 20 second holds, abduction 10x 20 second holds  Glenohumeral AP, PA mobilizations grade II with IR/ER bias, 4x 15 seconds for pain reduction.   scapular retraction and depression with overpressure 10x 10 second holds Distraction between interventions for pain relief multiple trials x 10 seconds  Therex: cues for guided motion  Supine: - AAROM flexion 15x 10 second hold with 3lb bar -bench press with 3lb bar 15x ; cues for keeping elbows to side  - AROM flexion 10x 3 second holds cues for thumb up in the air ; 3lb dumbbell flexion 10x  - Scapular punches modified LUE 10x ; pt describes to have medium difficulty with technique; 3lb          Seated: -Scapular retraction/row 15x with RTB -ER/IR AROM  RTB 15x with cues for stabilization to side ; x 2 sets  GTB row 15x -Knee to Hip slides for increasing extension and IR of shoulder, x10 -Knee to back of head for ER, x10 3lb bar: chest press with focus on scapular retraction 15x 3lb bar straight arm raise to head height 10x     Standing: Shape tower on vertical dowels with  alternating height of transfers, cues for neutral alignment and reduction of internal rotation of glenohumeral joint. X 6 minutes; patient reports medium UE wall slides 10x; occasional assistance for reduction of external rotation Alternating shoulder taps with alt UE on wall 10x each UE Modified wall pushup 10x. Focus on scapular push with a plus    Pt educated throughout session about proper posture and technique with exercises. Improved exercise technique, movement at target joints, use of target muscles after min to mod verbal, visual, tactile cues   Patient tolerates weight increase from 2 to 3lbs well with no increase in pain and no loss of available motion. Patient remains highly motivated throughout session.  Pt is still limited by tone in the L UE, however is making significant improvements with scapular control and ability to perform exercises with decreased verbal and tactile cuingPt will continue to benfit from skilled therapy to address remaining deficits and will continue with current POC                        PT Education - 05/23/20 1106    Education Details body mechanics, exercise technique    Person(s) Educated Patient    Methods Explanation;Demonstration;Tactile cues;Verbal cues    Comprehension Verbalized understanding;Returned demonstration;Verbal cues required;Tactile cues required            PT Short Term Goals - 04/05/20 1314      PT SHORT TERM GOAL #1   Title After 2 weeks pt will demontrate improve P/ROM shoulder flexion 100 degrees FF as per protocol.    Baseline 85 degrees 1/31: 93 degrees 3/3: 112 degrees    Time 2    Period Weeks    Status Achieved    Target Date 03/19/20      PT SHORT TERM GOAL #2   Title After 2 weeks pt will demonstrate correct performance of HEP for P/ROM of shoulder, and AA/ROM of elbow, wrist, and hand.    Baseline Issued at evlauation 1/31: HEP progressed 3/3: HEP compliant    Time 2    Period Weeks    Status  Partially Met    Target Date 04/19/20             PT Long Term Goals - 05/16/20 1124      PT LONG TERM GOAL #1   Title After 8 weeks pt will demonstrate improved daily function AEB FOTO: >43.    Baseline 22 at evaluation. 1/31: 50% 3/3 58% 4/13: 56%    Time 8    Period Weeks    Status Achieved  PT LONG TERM GOAL #2   Title After 8 weeks pt will demonstrate Left shoulder flexion >135 degrees; MMT flexion 4/5, ABDCT: 5/5, elbow flexion: 5/5.    Baseline not assessed at eval 1/31: flexion arom 91, MMT 2+/5 3/3: supine AROM 112 degrees MMT 2+ 4/13: seated flexion 108, abduction 113; see note for supine    Time 8    Period Weeks    Status Partially Met    Target Date 05/31/20      PT LONG TERM GOAL #3   Title Patient will decrease Quick DASH score by > 8 points (32%)  demonstrating reduced self-reported upper extremity disability.    Baseline 3/3/: 40.9% 4/13: 38.6%    Time 8    Period Weeks    Status Partially Met    Target Date 05/31/20                 Plan - 05/23/20 1133    Clinical Impression Statement Patient tolerates weight increase from 2 to 3lbs well with no increase in pain and no loss of available motion. Patient remains highly motivated throughout session.  Pt is still limited by tone in the L UE, however is making significant improvements with scapular control and ability to perform exercises with decreased verbal and tactile cuingPt will continue to benfit from skilled therapy to address remaining deficits and will continue with current POC    Personal Factors and Comorbidities Age    Comorbidities HTN, Hx CVA    Examination-Activity Limitations Stand;Bathing;Bed Mobility;Reach Overhead;Carry;Sleep    Examination-Participation Restrictions Driving;Yard Work;Meal Prep    Stability/Clinical Decision Making Stable/Uncomplicated    Rehab Potential Good    PT Frequency 2x / week    PT Duration 8 weeks    PT Treatment/Interventions Patient/family  education;Balance training;Neuromuscular re-education;Therapeutic exercise;Stair training;Moist Heat;Functional mobility training;Therapeutic activities;Dry needling;Passive range of motion    PT Next Visit Plan Review HEP, continue with P/ROM, begin scapular strength training    PT Home Exercise Plan No updates today.    Consulted and Agree with Plan of Care Patient           Patient will benefit from skilled therapeutic intervention in order to improve the following deficits and impairments:  Decreased activity tolerance,Decreased strength,Difficulty walking,Decreased balance,Decreased coordination,Decreased mobility,Decreased endurance  Visit Diagnosis: Stiffness of left shoulder, not elsewhere classified  Muscle weakness (generalized)  Hemiparesis affecting left side as late effect of stroke [facility])     Problem List Patient Active Problem List   Diagnosis Date Noted  . History of stroke with residual effects 11/17/2019  . Right shoulder pain 10/25/2019  . Weakness 10/25/2019  . Decreased GFR 08/15/2019  . Dyslipidemia 08/15/2019  . AKI (acute kidney injury) ([place]) 08/15/2019  . Hemiparesis affecting left side as late effect of stroke ([place]) 08/15/2019  . Carotid stenosis 07/29/2019  . Carotid stenosis, right 07/29/2019  . Carotid artery plaque, right 07/19/2019  . Hx of completed stroke 07/19/2019  . Stroke ([place]) 07/06/2019  . TIA (transient ischemic attack) 07/05/2019  . Hypokalemia 07/05/2019  . GERD without esophagitis 06/18/2017  . Essential hypertension 08/19/2016  . Vertigo 12/05/2015  . Adenomatous colon polyp 01/31/2014  . Basal cell carcinoma 09/23/2013  . Hay fever 09/23/2013  . Vitamin D deficiency 09/23/2013  . Gastric catarrh 11/28/2011  . Hyperlipidemia, mixed 11/28/2011  . Preop cardiovascular exam 11/28/2011   Janna Arch, PT, DPT   05/23/2020, 12:31 PM  [place] MAIN [facility] SERVICES [address]  [place], [place], [address] Phone: [phone]   Fax:  [phone]  Name: Russell Herman MRN: [id number] Date of Birth: 03/02/1945

## 2020-05-28 ENCOUNTER — Other Ambulatory Visit: Payer: Self-pay

## 2020-05-28 ENCOUNTER — Ambulatory Visit: Payer: Medicare Other

## 2020-05-28 DIAGNOSIS — M25612 Stiffness of left shoulder, not elsewhere classified: Secondary | ICD-10-CM | POA: Diagnosis not present

## 2020-05-28 DIAGNOSIS — M6281 Muscle weakness (generalized): Secondary | ICD-10-CM

## 2020-05-28 DIAGNOSIS — I69354 Hemiplegia and hemiparesis following cerebral infarction affecting left non-dominant side: Secondary | ICD-10-CM

## 2020-05-28 NOTE — Therapy (Signed)
[place] MAIN [facility] SERVICES [address] [place], [place], [address] Phone: [phone]   Fax:  [phone]  Physical Therapy Treatment  Patient Details  Name: Russell Herman MRN: [id number] Date of Birth: 1945-06-21 Referring Provider (PT): Leim Fabry MD (Orthopedics)   Encounter Date: 05/28/2020   PT End of Session - 05/28/20 1119    Visit Number 33    Number of Visits 34    Date for PT Re-Evaluation 05/31/20    Authorization Type 3/10 Pn 4/13    Authorization Time Period 01/13/20-04/06/20; 04/05/20-05/31/20    PT Start Time 1115    PT Stop Time 1159    PT Time Calculation (min) 44 min    Activity Tolerance Patient tolerated treatment well;No increased pain    Behavior During Therapy WFL for tasks assessed/performed           Past Medical History:  Diagnosis Date  . Adenomatous colon polyp   . Allergy   . Arthritis   . Cancer ([place])    basal cell skin  . Carotid stenosis   . Coronary artery disease   . GERD (gastroesophageal reflux disease)   . HLD (hyperlipidemia)   . Hypertension   . Pre-diabetes   . Stroke ([place])   . Wears hearing aid in both ears     Past Surgical History:  Procedure Laterality Date  . cyst removed    . ENDARTERECTOMY Right 08/04/2019   Procedure: ENDARTERECTOMY CAROTID;  Surgeon: Algernon Huxley, MD;  Location: [facility];  Service: Vascular;  Laterality: Right;  . EXCISION OF TONGUE LESION Bilateral 05/05/2019   Procedure: EXCISION OF DORSAL TONGUE LESION;  Surgeon: Margaretha Sheffield, MD;  Location: [place];  Service: ENT;  Laterality: Bilateral;  . SHOULDER ARTHROSCOPY WITH ROTATOR CUFF REPAIR AND SUBACROMIAL DECOMPRESSION Left 01/09/2020   Procedure: Left shoulder arthroscopic  rotator cuff repair with Regeneten patch, subacromial decompression, and biceps tenodesis;  Surgeon: Leim Fabry, MD;  Location: [facility];  Service: Orthopedics;  Laterality: Left;  . skin cancer removed      There were no  vitals filed for this visit.   Subjective Assessment - 05/28/20 1117    Subjective Patient reports pain remains the same, is slowly improving his movement.    Pertinent History Right lateral thalamic infarct mild ataxia and mild hemiparesis. Patient went to rehab 07/07/19-07/15/19. Then he had surgery 7/1 for  s/p successful right CEA    How long can you sit comfortably? not related    How long can you stand comfortably? not related    How long can you walk comfortably? not related    Currently in Pain? Yes    Pain Score 2     Pain Location Shoulder    Pain Orientation Left    Pain Descriptors / Indicators Aching    Pain Type Chronic pain    Pain Onset More than a month ago                Manual:  Supine:  Passive L shoulder ROM with multipoint holds for tension reduction: Flexion 10x 20 second holds, ER 10x 20 second holds  Glenohumeral AP, PA mobilizations grade II with IR/ER bias, 3x 15 seconds for pain reduction.   scapular retraction and depression with overpressure 10x 10 second holds Distraction between interventions for pain relief multiple trials x 10 seconds      Therex: cues for guided motion SciFit 2 mins forward, 2 mins backwards, cues for  body mechanics and sequencing; backwards more challenging for patient.   Supine: - AAROM flexion 10x 10 second hold with 3lb bar -bench press with 3lb bar 15x ; cues for keeping elbows to side  - AROM flexion 10x 3 second holds cues for thumb up in the air ; 3lb dumbbell flexion 10x  - Scapular punches modified LUE 10x ; pt describes to have medium difficulty with technique; 3lb      Seated: -Scapular retraction/row 15x with RTB -ER/IR AROM  RTB 15x with cues for stabilization to side ; x 2 sets  GTB row 15x -Knee to Hip slides for increasing extension and IR of shoulder, x10 -Knee to back of head for ER, x10 3lb bar: chest press with focus on scapular retraction 15x 3lb bar straight arm raise to head height 10x      Standing: Shape tower on vertical dowels with alternating height of transfers, cues for neutral alignment and reduction of internal rotation of glenohumeral joint. X 6 minutes; patient reports medium; added .25 weight for half UE wall slides 10x; occasional assistance for reduction of external rotation Alternating shoulder taps with alt UE on wall 10x each UE Modified wall pushup 10x. Focus on scapular push with a plus   wall walks /finger walks 5x with cues for arm placement  Pt educated throughout session about proper posture and technique with exercises. Improved exercise technique, movement at target joints, use of target muscles after min to mod verbal, visual, tactile cues   Patient's external rotation and flexion are slowly improving in active and passive motion. His scapular control continues to be an area of improvement but does show some activation with decreased need for cueing.  Pt will continue to benefit from skilled therapy to address remaining deficits and will continue with current POC                         PT Education - 05/28/20 1118    Education Details exercise technique, body mechanics    Person(s) Educated Patient    Methods Explanation;Demonstration;Tactile cues;Verbal cues    Comprehension Verbalized understanding;Returned demonstration;Verbal cues required;Tactile cues required            PT Short Term Goals - 04/05/20 1314      PT SHORT TERM GOAL #1   Title After 2 weeks pt will demontrate improve P/ROM shoulder flexion 100 degrees FF as per protocol.    Baseline 85 degrees 1/31: 93 degrees 3/3: 112 degrees    Time 2    Period Weeks    Status Achieved    Target Date 03/19/20      PT SHORT TERM GOAL #2   Title After 2 weeks pt will demonstrate correct performance of HEP for P/ROM of shoulder, and AA/ROM of elbow, wrist, and hand.    Baseline Issued at evlauation 1/31: HEP progressed 3/3: HEP compliant    Time 2    Period Weeks     Status Partially Met    Target Date 04/19/20             PT Long Term Goals - 05/16/20 1124      PT LONG TERM GOAL #1   Title After 8 weeks pt will demonstrate improved daily function AEB FOTO: >43.    Baseline 22 at evaluation. 1/31: 50% 3/3 58% 4/13: 56%    Time 8    Period Weeks    Status Achieved      PT  LONG TERM GOAL #2   Title After 8 weeks pt will demonstrate Left shoulder flexion >135 degrees; MMT flexion 4/5, ABDCT: 5/5, elbow flexion: 5/5.    Baseline not assessed at eval 1/31: flexion arom 91, MMT 2+/5 3/3: supine AROM 112 degrees MMT 2+ 4/13: seated flexion 108, abduction 113; see note for supine    Time 8    Period Weeks    Status Partially Met    Target Date 05/31/20      PT LONG TERM GOAL #3   Title Patient will decrease Quick DASH score by > 8 points (32%)  demonstrating reduced self-reported upper extremity disability.    Baseline 3/3/: 40.9% 4/13: 38.6%    Time 8    Period Weeks    Status Partially Met    Target Date 05/31/20                 Plan - 05/28/20 1130    Clinical Impression Statement Patient's external rotation and flexion are slowly improving in active and passive motion. His scapular control continues to be an area of improvement but does show some activation with decreased need for cueing.  Pt will continue to benefit from skilled therapy to address remaining deficits and will continue with current POC    Personal Factors and Comorbidities Age    Comorbidities HTN, Hx CVA    Examination-Activity Limitations Stand;Bathing;Bed Mobility;Reach Overhead;Carry;Sleep    Examination-Participation Restrictions Driving;Yard Work;Meal Prep    Stability/Clinical Decision Making Stable/Uncomplicated    Rehab Potential Good    PT Frequency 2x / week    PT Duration 8 weeks    PT Treatment/Interventions Patient/family education;Balance training;Neuromuscular re-education;Therapeutic exercise;Stair training;Moist Heat;Functional mobility  training;Therapeutic activities;Dry needling;Passive range of motion    PT Next Visit Plan Review HEP, continue with P/ROM, begin scapular strength training    PT Home Exercise Plan No updates today.    Consulted and Agree with Plan of Care Patient           Patient will benefit from skilled therapeutic intervention in order to improve the following deficits and impairments:  Decreased activity tolerance,Decreased strength,Difficulty walking,Decreased balance,Decreased coordination,Decreased mobility,Decreased endurance  Visit Diagnosis: Stiffness of left shoulder, not elsewhere classified  Muscle weakness (generalized)  Hemiparesis affecting left side as late effect of stroke [facility])     Problem List Patient Active Problem List   Diagnosis Date Noted  . History of stroke with residual effects 11/17/2019  . Right shoulder pain 10/25/2019  . Weakness 10/25/2019  . Decreased GFR 08/15/2019  . Dyslipidemia 08/15/2019  . AKI (acute kidney injury) ([place]) 08/15/2019  . Hemiparesis affecting left side as late effect of stroke ([place]) 08/15/2019  . Carotid stenosis 07/29/2019  . Carotid stenosis, right 07/29/2019  . Carotid artery plaque, right 07/19/2019  . Hx of completed stroke 07/19/2019  . Stroke ([place]) 07/06/2019  . TIA (transient ischemic attack) 07/05/2019  . Hypokalemia 07/05/2019  . GERD without esophagitis 06/18/2017  . Essential hypertension 08/19/2016  . Vertigo 12/05/2015  . Adenomatous colon polyp 01/31/2014  . Basal cell carcinoma 09/23/2013  . Hay fever 09/23/2013  . Vitamin D deficiency 09/23/2013  . Gastric catarrh 11/28/2011  . Hyperlipidemia, mixed 11/28/2011  . Preop cardiovascular exam 11/28/2011   Janna Arch, PT, DPT   05/28/2020, 12:22 PM  [place] MAIN [facility] SERVICES [address] [place], [place], [address] Phone: [phone]   Fax:  [phone]  Name: Russell Herman MRN: [id number] Date of Birth:  09-26-1945

## 2020-05-30 ENCOUNTER — Other Ambulatory Visit: Payer: Self-pay

## 2020-05-30 ENCOUNTER — Ambulatory Visit: Payer: Medicare Other

## 2020-05-30 DIAGNOSIS — M6281 Muscle weakness (generalized): Secondary | ICD-10-CM

## 2020-05-30 DIAGNOSIS — M25612 Stiffness of left shoulder, not elsewhere classified: Secondary | ICD-10-CM

## 2020-05-30 DIAGNOSIS — I69354 Hemiplegia and hemiparesis following cerebral infarction affecting left non-dominant side: Secondary | ICD-10-CM

## 2020-05-30 NOTE — Therapy (Signed)
[facility] SERVICES [address] [place], [place], [address] Phone: [phone]   Fax:  [phone]  Physical Therapy Treatment/RECERT  Patient Details  Name: Russell Herman MRN: [id number] Date of Birth: 08/22/1945 Referring Provider (PT): Leim Fabry MD (Orthopedics)   Encounter Date: 05/30/2020   PT End of Session - 05/30/20 1244    Visit Number 34    Number of Visits 50    Date for PT Re-Evaluation 07/25/20    Authorization Type 4/10 Pn 4/13    Authorization Time Period 01/13/20-04/06/20; 04/05/20-05/31/20    PT Start Time 1115    PT Stop Time 1159    PT Time Calculation (min) 44 min    Activity Tolerance Patient tolerated treatment well;No increased pain    Behavior During Therapy WFL for tasks assessed/performed           Past Medical History:  Diagnosis Date  . Adenomatous colon polyp   . Allergy   . Arthritis   . Cancer ([place])    basal cell skin  . Carotid stenosis   . Coronary artery disease   . GERD (gastroesophageal reflux disease)   . HLD (hyperlipidemia)   . Hypertension   . Pre-diabetes   . Stroke ([place])   . Wears hearing aid in both ears     Past Surgical History:  Procedure Laterality Date  . cyst removed    . ENDARTERECTOMY Right 08/04/2019   Procedure: ENDARTERECTOMY CAROTID;  Surgeon: Algernon Huxley, MD;  Location: [facility];  Service: Vascular;  Laterality: Right;  . EXCISION OF TONGUE LESION Bilateral 05/05/2019   Procedure: EXCISION OF DORSAL TONGUE LESION;  Surgeon: Margaretha Sheffield, MD;  Location: [place];  Service: ENT;  Laterality: Bilateral;  . SHOULDER ARTHROSCOPY WITH ROTATOR CUFF REPAIR AND SUBACROMIAL DECOMPRESSION Left 01/09/2020   Procedure: Left shoulder arthroscopic  rotator cuff repair with Regeneten patch, subacromial decompression, and biceps tenodesis;  Surgeon: Leim Fabry, MD;  Location: [facility];  Service: Orthopedics;  Laterality: Left;  . skin cancer removed      There  were no vitals filed for this visit.   Subjective Assessment - 05/30/20 1119    Subjective Patient reports his pain remains the same but his motion is improving.Patient reports that his shoulder feels itchy, reports it feels like poisen ivy.    Pertinent History Right lateral thalamic infarct mild ataxia and mild hemiparesis. Patient went to rehab 07/07/19-07/15/19. Then he had surgery 7/1 for  s/p successful right CEA    How long can you sit comfortably? not related    How long can you stand comfortably? not related    How long can you walk comfortably? not related    Currently in Pain? Yes    Pain Score 2     Pain Location Shoulder    Pain Orientation Left    Pain Descriptors / Indicators Aching    Pain Type Chronic pain    Pain Onset More than a month ago    Pain Frequency Intermittent                   seated AROM: shoulder hike with movement notified.  Flexion: 116 Abduction: 114    Manual:  Supine:  Passive L shoulder ROM with multipoint holds for tension reduction: Flexion 10x 20 second holds, ER 10x 20 second holds  Glenohumeral AP, PA mobilizations grade II with IR/ER bias, 3x 15 seconds for pain reduction.   scapular retraction and  depression with overpressure 10x 10 second holds Distraction between interventions for pain relief multiple trials x 10 seconds      Therex: cues for guided motion SciFit 2 mins forward, 2 mins backwards, cues for body mechanics and sequencing; backwards more challenging for patient.   Supine: - AAROM flexion 10x 10 second hold with 3.5lb bar -bench press with 3.5lb bar 15x ; cues for keeping elbows to side  - AROM flexion 10x 3 second holds cues for thumb up in the air ; 4lb dumbbell flexion 10x  - Scapular punches modified LUE 10x ; pt describes to have medium difficulty with technique; 4lb      Seated: -Scapular retraction/row 15x with RTB -ER/IR AROM  RTB 15x with cues for stabilization to side ; x 2 sets  GTB row 15x -Knee to  Hip slides for increasing extension and IR of shoulder, x10 -Knee to back of head for ER, x10 3.5lb bar: chest press with focus on scapular retraction 15x 3.5lb bar straight arm raise to head height 10x     Standing: Shape tower on vertical dowels with alternating height of transfers, cues for neutral alignment and reduction of internal rotation of glenohumeral joint. X 6 minutes; patient reports medium; added .25 weight for half UE wall slides 10x; occasional assistance for reduction of external rotation Alternating shoulder taps with alt UE on wall 10x each UE Modified wall pushup 10x. Focus on scapular push with a plus   wall walks /finger walks 5x with cues for arm placement   Pt educated throughout session about proper posture and technique with exercises. Improved exercise technique, movement at target joints, use of target muscles after min to mod verbal, visual, tactile cues   Patient reports he is 75% to where he wants to be. 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     HEP performed and demonstrated understanding.   Access Code: [email or web address] URL: [email or web address] Date: 05/30/2020 Prepared by: Janna Arch  Exercises Wall Push Up with Plus - 1 x daily - 7 x weekly - 2 sets - 10 reps - 5 hold Seated Shoulder Flexion - 1 x daily - 7 x weekly - 2 sets - 10 reps - 5 hold Seated Shoulder External Rotation - 1 x daily - 7 x weekly - 2 sets - 10 reps - 5 hold Seated Shoulder Circles - 1 x daily - 7 x weekly - 2 sets - 10 reps - 5 hold       Patient is progressing towards functional goals. Please refer to note from 05/16/20 for specific goal progressions. New goals for returning to gardening and reaching into a shelf overhead added. Patient tolerating progressive weights with no increase in pain. This will be patient's last recert to get him to be more independent and functional with  LUE. His scapular control continues to be an area of improvement but does show some activation with decreased need for cueing. Pt will continue to benefit from skilled therapy to address remaining deficits and will continue with current POC      PT Education - 05/30/20 1120    Education Details POC, exercise technique, body mechanics    Person(s) Educated Patient    Methods Explanation;Demonstration;Tactile cues;Verbal cues    Comprehension Verbalized understanding;Returned demonstration;Verbal cues required;Tactile cues required            PT Short Term Goals - 05/30/20 1238  PT SHORT TERM GOAL #1   Title After 2 weeks pt will demontrate improve P/ROM shoulder flexion 100 degrees FF as per protocol.    Baseline 85 degrees 1/31: 93 degrees 3/3: 112 degrees    Time 2    Period Weeks    Status Achieved    Target Date 03/19/20      PT SHORT TERM GOAL #2   Title After 2 weeks pt will demonstrate correct performance of HEP for P/ROM of shoulder, and AA/ROM of elbow, wrist, and hand.    Baseline Issued at evlauation 1/31: HEP progressed 3/3: HEP compliant 4/27 HEP compliant    Time 2    Period Weeks    Status Achieved    Target Date 04/19/20             PT Long Term Goals - 05/30/20 1238      PT LONG TERM GOAL #1   Title After 8 weeks pt will demonstrate improved daily function AEB FOTO: >43.    Baseline 22 at evaluation. 1/31: 50% 3/3 58% 4/13: 56%    Time 8    Period Weeks    Status Achieved      PT LONG TERM GOAL #2   Title After 8 weeks pt will demonstrate Left shoulder flexion >135 degrees; MMT flexion 4/5, ABDCT: 5/5, elbow flexion: 5/5.    Baseline not assessed at eval 1/31: flexion arom 91, MMT 2+/5 3/3: supine AROM 112 degrees MMT 2+ 4/13: seated flexion 108, abduction 113; see note for supine 4/27: seated flexion 116 abduction 114 with shoulder hike    Time 8    Period Weeks    Status Partially Met    Target Date 07/25/20      PT LONG TERM GOAL #3    Title Patient will decrease Quick DASH score by > 8 points (32%)  demonstrating reduced self-reported upper extremity disability.    Baseline 3/3/: 40.9% 4/13: 38.6%    Time 8    Period Weeks    Status Partially Met    Target Date 07/25/20      PT LONG TERM GOAL #4   Title Patient will improve functional ROM and strength of LUE to allow for patient to reach into cabinet overhead and take down items weighing ~5lb.    Baseline 4/27: unable to place or bring down weighted item    Time 8    Period Weeks    Status New    Target Date 07/25/20      PT LONG TERM GOAL #5   Title Patient will return to gardening, demonstrating enough functional strength for manipulation of rake, hoe, and shovel for return to PLOF.    Baseline 4/27: unable to perform    Time 8    Period Weeks    Status New    Target Date 07/25/20                 Plan - 05/30/20 1149    Clinical Impression Statement Patient is progressing towards functional goals. Please refer to note from 05/16/20 for specific goal progressions. New goals for returning to gardening and reaching into a shelf overhead added. Patient tolerating progressive weights with no increase in pain. This will be patient's last recert to get him to be more independent and functional with LUE. His scapular control continues to be an area of improvement but does show some activation with decreased need for cueing. Pt will continue to benefit from skilled therapy to address  remaining deficits and will continue with current POC    Personal Factors and Comorbidities Age    Comorbidities HTN, Hx CVA    Examination-Activity Limitations Stand;Bathing;Bed Mobility;Reach Overhead;Carry;Sleep    Examination-Participation Restrictions Driving;Yard Work;Meal Prep    Stability/Clinical Decision Making Stable/Uncomplicated    Rehab Potential Good    PT Frequency 2x / week    PT Duration 8 weeks    PT Treatment/Interventions Patient/family education;Balance  training;Neuromuscular re-education;Therapeutic exercise;Stair training;Moist Heat;Functional mobility training;Therapeutic activities;Dry needling;Passive range of motion    PT Next Visit Plan Review HEP, continue with P/ROM, begin scapular strength training    PT Home Exercise Plan No updates today.    Consulted and Agree with Plan of Care Patient           Patient will benefit from skilled therapeutic intervention in order to improve the following deficits and impairments:  Decreased activity tolerance,Decreased strength,Difficulty walking,Decreased balance,Decreased coordination,Decreased mobility,Decreased endurance  Visit Diagnosis: Stiffness of left shoulder, not elsewhere classified - Plan: PT plan of care cert/re-cert  Muscle weakness (generalized) - Plan: PT plan of care cert/re-cert  Hemiparesis affecting left side as late effect of stroke ([place]) - Plan: PT plan of care cert/re-cert     Problem List Patient Active Problem List   Diagnosis Date Noted  . History of stroke with residual effects 11/17/2019  . Right shoulder pain 10/25/2019  . Weakness 10/25/2019  . Decreased GFR 08/15/2019  . Dyslipidemia 08/15/2019  . AKI (acute kidney injury) ([place]) 08/15/2019  . Hemiparesis affecting left side as late effect of stroke ([place]) 08/15/2019  . Carotid stenosis 07/29/2019  . Carotid stenosis, right 07/29/2019  . Carotid artery plaque, right 07/19/2019  . Hx of completed stroke 07/19/2019  . Stroke ([place]) 07/06/2019  . TIA (transient ischemic attack) 07/05/2019  . Hypokalemia 07/05/2019  . GERD without esophagitis 06/18/2017  . Essential hypertension 08/19/2016  . Vertigo 12/05/2015  . Adenomatous colon polyp 01/31/2014  . Basal cell carcinoma 09/23/2013  . Hay fever 09/23/2013  . Vitamin D deficiency 09/23/2013  . Gastric catarrh 11/28/2011  . Hyperlipidemia, mixed 11/28/2011  . Preop cardiovascular exam 11/28/2011   Janna Arch, PT, DPT   05/30/2020, 12:44  PM  [place] [facility] SERVICES [address] [place], [place], [address] Phone: [phone]   Fax:  [phone]  Name: Russell Herman MRN: [id number] Date of Birth: July 10, 1945

## 2020-06-04 ENCOUNTER — Ambulatory Visit: Payer: Medicare Other | Attending: Orthopedic Surgery

## 2020-06-04 ENCOUNTER — Other Ambulatory Visit: Payer: Self-pay

## 2020-06-04 DIAGNOSIS — M25612 Stiffness of left shoulder, not elsewhere classified: Secondary | ICD-10-CM

## 2020-06-04 DIAGNOSIS — I69354 Hemiplegia and hemiparesis following cerebral infarction affecting left non-dominant side: Secondary | ICD-10-CM | POA: Diagnosis present

## 2020-06-04 DIAGNOSIS — M6281 Muscle weakness (generalized): Secondary | ICD-10-CM | POA: Diagnosis present

## 2020-06-04 NOTE — Therapy (Signed)
[facility] [address] [place], [place], [address] Phone: [phone]   Fax:  [phone]  Physical Therapy Treatment  Patient Details  Name: Russell Herman MRN: [id number] Date of Birth: 04/03/1945 Referring Provider (PT): Sunny Patel MD (Orthopedics)   Encounter Date: 06/04/2020   PT End of Session - 06/04/20 1019    Visit Number 35    Number of Visits 50    Date for PT Re-Evaluation 07/25/20    Authorization Type 5/10 Pn 4/13    Authorization Time Period 01/13/20-04/06/20; 04/05/20-05/31/20    PT Start Time 1015    PT Stop Time 1059    PT Time Calculation (min) 44 min    Activity Tolerance Patient tolerated treatment well;No increased pain    Behavior During Therapy WFL for tasks assessed/performed           Past Medical History:  Diagnosis Date  . Adenomatous colon polyp   . Allergy   . Arthritis   . Cancer ([facility])    basal cell skin  . Carotid stenosis   . Coronary artery disease   . GERD (gastroesophageal reflux disease)   . HLD (hyperlipidemia)   . Hypertension   . Pre-diabetes   . Stroke ([facility])   . Wears hearing aid in both ears     Past Surgical History:  Procedure Laterality Date  . cyst removed    . ENDARTERECTOMY Right 08/04/2019   Procedure: ENDARTERECTOMY CAROTID;  Surgeon: Dew, Jason S, MD;  Location: [facility];  Service: Vascular;  Laterality: Right;  . EXCISION OF TONGUE LESION Bilateral 05/05/2019   Procedure: EXCISION OF DORSAL TONGUE LESION;  Surgeon: Juengel, Paul, MD;  Location: [facility];  Service: ENT;  Laterality: Bilateral;  . SHOULDER ARTHROSCOPY WITH ROTATOR CUFF REPAIR AND SUBACROMIAL DECOMPRESSION Left 01/09/2020   Procedure: Left shoulder arthroscopic  rotator cuff repair with Regeneten patch, subacromial decompression, and biceps tenodesis;  Surgeon: Patel, Sunny, MD;  Location: [facility];  Service: Orthopedics;  Laterality: Left;  . skin cancer removed      There were no  vitals filed for this visit.   Subjective Assessment - 06/04/20 1018    Subjective Patient reports the itching from medication continues to bother him. Reports his leg is feeling stiffer today.    Pertinent History Right lateral thalamic infarct mild ataxia and mild hemiparesis. Patient went to rehab 07/07/19-07/15/19. Then he had surgery 7/1 for  s/p successful right CEA    How long can you sit comfortably? not related    How long can you stand comfortably? not related    How long can you walk comfortably? not related    Currently in Pain? Yes    Pain Score 2     Pain Location Shoulder    Pain Orientation Left    Pain Descriptors / Indicators Aching    Pain Onset More than a month ago    Pain Frequency Intermittent                   Manual:  Supine:  Passive L shoulder ROM with multipoint holds for tension reduction: Flexion 10x 20 second holds, ER 10x 20 second holds  Glenohumeral AP, PA mobilizations grade II with IR/ER bias, 3x 15 seconds for pain reduction.   scapular retraction and depression with overpressure 10x 10 second holds Distraction between interventions for pain relief multiple trials x 10 seconds      Therex: cues for guided motion SciFit   2 mins forward, 2 mins backwards, cues for body mechanics and sequencing; backwards more challenging for patient.   Supine: - AAROM flexion 10x 10 second hold with 3.5lb bar -bench press with 3.5lb bar 15x ; cues for keeping elbows to side  - Scapular punches modified LUE 10x ; pt describes to have medium difficulty with technique; 4lb  Cross body adduction with PT distraction at glenohumeral joint 5x   PNF D1 and D2 10x each LUE ; max cueing for cross body reach due to patient limitation    Seated: -Scapular retraction/row 15x with RTB -ER/IR AROM  RTB 15x with cues for stabilization to side ; x 2 sets  GTB row 15x -Knee to Hip slides for increasing extension and IR of shoulder, x10 -Knee to back of head for ER,  x10 3.5lb bar: chest press with focus on scapular retraction 15x 3.5lb bar straight arm raise to head height 10x     Standing: Back to wall wall slide abduction 10x; very challenging and limited for LUE  UE wall slides 10x; occasional assistance for reduction of external rotation Alternating shoulder taps with alt UE on wall 10x each UE Modified wall pushup 10x. Focus on scapular push with a plus   wall walks /finger walks 5x with cues for arm placement   Pt educated throughout session about proper posture and technique with exercises. Improved exercise technique, movement at target joints, use of target muscles after min to mod verbal, visual, tactile cues    Patient tolerates session despite increased stiffness this session. Upon fatigue he returns to internal rotation with elbow flaring requring increased verbal and tactile cueing. His scapular control continues to be an area of improvement but does show some activation with decreased need for cueing. Pt will continue to benefit from skilled therapy to address remaining deficits and will continue with current POC                 PT Education - 06/04/20 1018    Education Details exercise technique, body mechanics    Person(s) Educated Patient    Methods Explanation;Demonstration;Tactile cues;Verbal cues    Comprehension Verbalized understanding;Returned demonstration;Verbal cues required;Tactile cues required            PT Short Term Goals - 05/30/20 1238      PT SHORT TERM GOAL #1   Title After 2 weeks pt will demontrate improve P/ROM shoulder flexion 100 degrees FF as per protocol.    Baseline 85 degrees 1/31: 93 degrees 3/3: 112 degrees    Time 2    Period Weeks    Status Achieved    Target Date 03/19/20      PT SHORT TERM GOAL #2   Title After 2 weeks pt will demonstrate correct performance of HEP for P/ROM of shoulder, and AA/ROM of elbow, wrist, and hand.    Baseline Issued at evlauation 1/31: HEP  progressed 3/3: HEP compliant 4/27 HEP compliant    Time 2    Period Weeks    Status Achieved    Target Date 04/19/20             PT Long Term Goals - 05/30/20 1238      PT LONG TERM GOAL #1   Title After 8 weeks pt will demonstrate improved daily function AEB FOTO: >43.    Baseline 22 at evaluation. 1/31: 50% 3/3 58% 4/13: 56%    Time 8    Period Weeks    Status Achieved  PT LONG TERM GOAL #2   Title After 8 weeks pt will demonstrate Left shoulder flexion >135 degrees; MMT flexion 4/5, ABDCT: 5/5, elbow flexion: 5/5.    Baseline not assessed at eval 1/31: flexion arom 91, MMT 2+/5 3/3: supine AROM 112 degrees MMT 2+ 4/13: seated flexion 108, abduction 113; see note for supine 4/27: seated flexion 116 abduction 114 with shoulder hike    Time 8    Period Weeks    Status Partially Met    Target Date 07/25/20      PT LONG TERM GOAL #3   Title Patient will decrease Quick DASH score by > 8 points (32%)  demonstrating reduced self-reported upper extremity disability.    Baseline 3/3/: 40.9% 4/13: 38.6%    Time 8    Period Weeks    Status Partially Met    Target Date 07/25/20      PT LONG TERM GOAL #4   Title Patient will improve functional ROM and strength of LUE to allow for patient to reach into cabinet overhead and take down items weighing ~5lb.    Baseline 4/27: unable to place or bring down weighted item    Time 8    Period Weeks    Status New    Target Date 07/25/20      PT LONG TERM GOAL #5   Title Patient will return to gardening, demonstrating enough functional strength for manipulation of rake, hoe, and shovel for return to PLOF.    Baseline 4/27: unable to perform    Time 8    Period Weeks    Status New    Target Date 07/25/20                 Plan - 06/04/20 1034    Clinical Impression Statement Patient tolerates session despite increased stiffness this session. Upon fatigue he returns to internal rotation with elbow flaring requring increased  verbal and tactile cueing. His scapular control continues to be an area of improvement but does show some activation with decreased need for cueing. Pt will continue to benefit from skilled therapy to address remaining deficits and will continue with current POC    Personal Factors and Comorbidities Age    Comorbidities HTN, Hx CVA    Examination-Activity Limitations Stand;Bathing;Bed Mobility;Reach Overhead;Carry;Sleep    Examination-Participation Restrictions Driving;Yard Work;Meal Prep    Stability/Clinical Decision Making Stable/Uncomplicated    Rehab Potential Good    PT Frequency 2x / week    PT Duration 8 weeks    PT Treatment/Interventions Patient/family education;Balance training;Neuromuscular re-education;Therapeutic exercise;Stair training;Moist Heat;Functional mobility training;Therapeutic activities;Dry needling;Passive range of motion    PT Next Visit Plan Review HEP, continue with P/ROM, begin scapular strength training    PT Home Exercise Plan No updates today.    Consulted and Agree with Plan of Care Patient           Patient will benefit from skilled therapeutic intervention in order to improve the following deficits and impairments:  Decreased activity tolerance,Decreased strength,Difficulty walking,Decreased balance,Decreased coordination,Decreased mobility,Decreased endurance  Visit Diagnosis: Stiffness of left shoulder, not elsewhere classified  Muscle weakness (generalized)  Hemiparesis affecting left side as late effect of stroke ([facility])     Problem List Patient Active Problem List   Diagnosis Date Noted  . History of stroke with residual effects 11/17/2019  . Right shoulder pain 10/25/2019  . Weakness 10/25/2019  . Decreased GFR 08/15/2019  . Dyslipidemia 08/15/2019  . AKI (acute kidney injury) ([facility]) 08/15/2019  .   Hemiparesis affecting left side as late effect of stroke ([place]) 08/15/2019  . Carotid stenosis 07/29/2019  . Carotid stenosis, right  07/29/2019  . Carotid artery plaque, right 07/19/2019  . Hx of completed stroke 07/19/2019  . Stroke ([place]) 07/06/2019  . TIA (transient ischemic attack) 07/05/2019  . Hypokalemia 07/05/2019  . GERD without esophagitis 06/18/2017  . Essential hypertension 08/19/2016  . Vertigo 12/05/2015  . Adenomatous colon polyp 01/31/2014  . Basal cell carcinoma 09/23/2013  . Hay fever 09/23/2013  . Vitamin D deficiency 09/23/2013  . Gastric catarrh 11/28/2011  . Hyperlipidemia, mixed 11/28/2011  . Preop cardiovascular exam 11/28/2011   Janna Arch, PT, DPT   06/04/2020, 11:00 AM  [place] [facility] SERVICES [address] [place], [place], [address] Phone: [phone]   Fax:  [phone]  Name: Russell Herman MRN: [id number] Date of Birth: 02-Aug-1945

## 2020-06-07 ENCOUNTER — Ambulatory Visit: Payer: Medicare Other

## 2020-06-07 ENCOUNTER — Other Ambulatory Visit: Payer: Self-pay

## 2020-06-07 DIAGNOSIS — M25612 Stiffness of left shoulder, not elsewhere classified: Secondary | ICD-10-CM

## 2020-06-07 DIAGNOSIS — I69354 Hemiplegia and hemiparesis following cerebral infarction affecting left non-dominant side: Secondary | ICD-10-CM

## 2020-06-07 DIAGNOSIS — M6281 Muscle weakness (generalized): Secondary | ICD-10-CM

## 2020-06-07 NOTE — Therapy (Signed)
[place] MAIN [facility] SERVICES [address] [place], [place], [address] Phone: [phone]   Fax:  [phone]  Physical Therapy Treatment  Patient Details  Name: Russell Herman MRN: [id number] Date of Birth: 11-20-1945 Referring Provider (PT): Leim Fabry MD (Orthopedics)   Encounter Date: 06/07/2020   PT End of Session - 06/07/20 1105    Visit Number 36    Number of Visits 50    Date for PT Re-Evaluation 07/25/20    Authorization Type 6/10 Pn 4/13    Authorization Time Period 01/13/20-04/06/20; 04/05/20-05/31/20    PT Start Time 1100    PT Stop Time 1145    PT Time Calculation (min) 45 min    Activity Tolerance Patient tolerated treatment well;No increased pain    Behavior During Therapy WFL for tasks assessed/performed           Past Medical History:  Diagnosis Date  . Adenomatous colon polyp   . Allergy   . Arthritis   . Cancer ([place])    basal cell skin  . Carotid stenosis   . Coronary artery disease   . GERD (gastroesophageal reflux disease)   . HLD (hyperlipidemia)   . Hypertension   . Pre-diabetes   . Stroke ([place])   . Wears hearing aid in both ears     Past Surgical History:  Procedure Laterality Date  . cyst removed    . ENDARTERECTOMY Right 08/04/2019   Procedure: ENDARTERECTOMY CAROTID;  Surgeon: Algernon Huxley, MD;  Location: [facility];  Service: Vascular;  Laterality: Right;  . EXCISION OF TONGUE LESION Bilateral 05/05/2019   Procedure: EXCISION OF DORSAL TONGUE LESION;  Surgeon: Margaretha Sheffield, MD;  Location: [place];  Service: ENT;  Laterality: Bilateral;  . SHOULDER ARTHROSCOPY WITH ROTATOR CUFF REPAIR AND SUBACROMIAL DECOMPRESSION Left 01/09/2020   Procedure: Left shoulder arthroscopic  rotator cuff repair with Regeneten patch, subacromial decompression, and biceps tenodesis;  Surgeon: Leim Fabry, MD;  Location: [facility];  Service: Orthopedics;  Laterality: Left;  . skin cancer removed      There were no  vitals filed for this visit.   Subjective Assessment - 06/07/20 1104    Subjective Patient reports compliance with HEP. Is feeling very tired today.    Pertinent History Right lateral thalamic infarct mild ataxia and mild hemiparesis. Patient went to rehab 07/07/19-07/15/19. Then he had surgery 7/1 for  s/p successful right CEA    How long can you sit comfortably? not related    How long can you stand comfortably? not related    How long can you walk comfortably? not related    Currently in Pain? Yes    Pain Score 1     Pain Location Shoulder    Pain Orientation Left    Pain Descriptors / Indicators Aching    Pain Type Chronic pain    Pain Onset More than a month ago    Pain Frequency Intermittent                BP: 103/62      Manual:  Supine:  Passive L shoulder ROM with multipoint holds for tension reduction: Flexion 10x 20 second holds, ER 10x 20 second holds  Glenohumeral AP, PA mobilizations grade II with IR/ER bias, 3x 15 seconds for pain reduction.   scapular retraction and depression with overpressure 10x 10 second holds Distraction between interventions for pain relief multiple trials x 10 seconds      Therex: cues  for guided motion SciFit 2 mins forward, 2 mins backwards, cues for body mechanics and sequencing; backwards more challenging for patient.   Supine: -bench press with 5.5lb bar 15x ; cues for keeping elbows to side  - Scapular punches modified LUE 10x ; pt describes to have medium difficulty with technique; 4lb   PNF D1 and D2 10x each LUE ; max cueing for cross body reach due to patient limitation    Seated: -Scapular retraction/row 15x with RTB -ER/IR AROM  RTB 15x with cues for stabilization to side ; x 2 sets  GTB row 15x RTB flexion with foot stepping on band 12x cues for flexion rather than scaption  RTB abduction with foot stepping on band 12x; very limited range 5lb bar: chest press with focus on scapular retraction 15x .5lb bar straight  arm raise to head height 10x Alternating single arm row with 5lb bar 10x each arm   3lb dumbbell wood chop 10x each diagonal  UE ranger large clockwise circles 10x, counterclockwise 10x  Pt educated throughout session about proper posture and technique with exercises. Improved exercise technique, movement at target joints, use of target muscles after min to mod verbal, visual, tactile cues   Cross body adduction is limited by muscle tissue lengthening. Standing interventions limited due to fatigue and occasional knee pain in standing. Scapular stabilization and muscle activation continues to be challenging for patient at this time requiring tactile cueing for sequencing. Progressive HEP given with patient demonstrating understanding. Pt will continue to benefit from skilled therapy to address remaining deficits and will continue with current POC    Access Code: [email or web address] URL: [email or web address] Date: 06/07/2020 Prepared by: Janna Arch  Exercises Seated Shoulder Front Raise with Resistance - 1 x daily - 7 x weekly - 2 sets - 10 reps - 5 hold Seated Shoulder Abduction with Resistance - 1 x daily - 7 x weekly - 2 sets - 10 reps - 5 hold Standing shoulder flexion wall slides - 1 x daily - 7 x weekly - 2 sets - 10 reps - 5 hold Wall Angels - 1 x daily - 7 x weekly - 2 sets - 10 reps - 5 hold                 PT Education - 06/07/20 1105    Education Details exercise technique, body mechanics    Person(s) Educated Patient    Methods Explanation;Demonstration;Tactile cues;Verbal cues    Comprehension Verbalized understanding;Returned demonstration;Verbal cues required;Tactile cues required            PT Short Term Goals - 05/30/20 1238      PT SHORT TERM GOAL #1   Title After 2 weeks pt will demontrate improve P/ROM shoulder flexion 100 degrees FF as per protocol.    Baseline 85 degrees 1/31: 93 degrees 3/3: 112 degrees    Time 2    Period Weeks     Status Achieved    Target Date 03/19/20      PT SHORT TERM GOAL #2   Title After 2 weeks pt will demonstrate correct performance of HEP for P/ROM of shoulder, and AA/ROM of elbow, wrist, and hand.    Baseline Issued at evlauation 1/31: HEP progressed 3/3: HEP compliant 4/27 HEP compliant    Time 2    Period Weeks    Status Achieved    Target Date 04/19/20             PT Long Term Goals - 05/30/20  1238      PT LONG TERM GOAL #1   Title After 8 weeks pt will demonstrate improved daily function AEB FOTO: >43.    Baseline 22 at evaluation. 1/31: 50% 3/3 58% 4/13: 56%    Time 8    Period Weeks    Status Achieved      PT LONG TERM GOAL #2   Title After 8 weeks pt will demonstrate Left shoulder flexion >135 degrees; MMT flexion 4/5, ABDCT: 5/5, elbow flexion: 5/5.    Baseline not assessed at eval 1/31: flexion arom 91, MMT 2+/5 3/3: supine AROM 112 degrees MMT 2+ 4/13: seated flexion 108, abduction 113; see note for supine 4/27: seated flexion 116 abduction 114 with shoulder hike    Time 8    Period Weeks    Status Partially Met    Target Date 07/25/20      PT LONG TERM GOAL #3   Title Patient will decrease Quick DASH score by > 8 points (32%)  demonstrating reduced self-reported upper extremity disability.    Baseline 3/3/: 40.9% 4/13: 38.6%    Time 8    Period Weeks    Status Partially Met    Target Date 07/25/20      PT LONG TERM GOAL #4   Title Patient will improve functional ROM and strength of LUE to allow for patient to reach into cabinet overhead and take down items weighing ~5lb.    Baseline 4/27: unable to place or bring down weighted item    Time 8    Period Weeks    Status New    Target Date 07/25/20      PT LONG TERM GOAL #5   Title Patient will return to gardening, demonstrating enough functional strength for manipulation of rake, hoe, and shovel for return to PLOF.    Baseline 4/27: unable to perform    Time 8    Period Weeks    Status New    Target Date  07/25/20                 Plan - 06/07/20 1138    Clinical Impression Statement Cross body adduction is limited by muscle tissue lengthening. Standing interventions limited due to fatigue and occasional knee pain in standing. Scapular stabilization and muscle activation continues to be challenging for patient at this time requiring tactile cueing for sequencing. Progressive HEP given with patient demonstrating understanding. Pt will continue to benefit from skilled therapy to address remaining deficits and will continue with current POC    Personal Factors and Comorbidities Age    Comorbidities HTN, Hx CVA    Examination-Activity Limitations Stand;Bathing;Bed Mobility;Reach Overhead;Carry;Sleep    Examination-Participation Restrictions Driving;Yard Work;Meal Prep    Stability/Clinical Decision Making Stable/Uncomplicated    Rehab Potential Good    PT Frequency 2x / week    PT Duration 8 weeks    PT Treatment/Interventions Patient/family education;Balance training;Neuromuscular re-education;Therapeutic exercise;Stair training;Moist Heat;Functional mobility training;Therapeutic activities;Dry needling;Passive range of motion    PT Next Visit Plan Review HEP, continue with P/ROM, begin scapular strength training    PT Home Exercise Plan No updates today.    Consulted and Agree with Plan of Care Patient           Patient will benefit from skilled therapeutic intervention in order to improve the following deficits and impairments:  Decreased activity tolerance,Decreased strength,Difficulty walking,Decreased balance,Decreased coordination,Decreased mobility,Decreased endurance  Visit Diagnosis: Stiffness of left shoulder, not elsewhere classified  Muscle weakness (generalized)  Hemiparesis affecting left side as late effect of stroke [facility])     Problem List Patient Active Problem List   Diagnosis Date Noted  . History of stroke with residual effects 11/17/2019  . Right shoulder  pain 10/25/2019  . Weakness 10/25/2019  . Decreased GFR 08/15/2019  . Dyslipidemia 08/15/2019  . AKI (acute kidney injury) ([place]) 08/15/2019  . Hemiparesis affecting left side as late effect of stroke ([place]) 08/15/2019  . Carotid stenosis 07/29/2019  . Carotid stenosis, right 07/29/2019  . Carotid artery plaque, right 07/19/2019  . Hx of completed stroke 07/19/2019  . Stroke ([place]) 07/06/2019  . TIA (transient ischemic attack) 07/05/2019  . Hypokalemia 07/05/2019  . GERD without esophagitis 06/18/2017  . Essential hypertension 08/19/2016  . Vertigo 12/05/2015  . Adenomatous colon polyp 01/31/2014  . Basal cell carcinoma 09/23/2013  . Hay fever 09/23/2013  . Vitamin D deficiency 09/23/2013  . Gastric catarrh 11/28/2011  . Hyperlipidemia, mixed 11/28/2011  . Preop cardiovascular exam 11/28/2011   Janna Arch, PT, DPT   06/07/2020, 12:14 PM  [place] MAIN [facility] SERVICES [address] [place], [place], [address] Phone: [phone]   Fax:  [phone]  Name: Russell Herman MRN: [id number] Date of Birth: 10-02-1945

## 2020-06-11 ENCOUNTER — Other Ambulatory Visit: Payer: Self-pay

## 2020-06-11 ENCOUNTER — Ambulatory Visit: Payer: Medicare Other

## 2020-06-11 DIAGNOSIS — M25612 Stiffness of left shoulder, not elsewhere classified: Secondary | ICD-10-CM | POA: Diagnosis not present

## 2020-06-11 DIAGNOSIS — I69354 Hemiplegia and hemiparesis following cerebral infarction affecting left non-dominant side: Secondary | ICD-10-CM

## 2020-06-11 DIAGNOSIS — M6281 Muscle weakness (generalized): Secondary | ICD-10-CM

## 2020-06-11 NOTE — Therapy (Signed)
[place] MAIN [facility] SERVICES [address] [place], [place], [address] Phone: [phone]   Fax:  [phone]  Physical Therapy Treatment  Patient Details  Name: Russell Herman MRN: [id number] Date of Birth: February 13, 1945 Referring Provider (PT): Leim Fabry MD (Orthopedics)   Encounter Date: 06/11/2020   PT End of Session - 06/11/20 1018    Visit Number 37    Number of Visits 50    Date for PT Re-Evaluation 07/25/20    Authorization Type 7/10 Pn 4/13    Authorization Time Period 01/13/20-04/06/20; 04/05/20-05/31/20    PT Start Time 1015    PT Stop Time 1058    PT Time Calculation (min) 43 min    Activity Tolerance Patient tolerated treatment well;No increased pain    Behavior During Therapy WFL for tasks assessed/performed           Past Medical History:  Diagnosis Date  . Adenomatous colon polyp   . Allergy   . Arthritis   . Cancer ([place])    basal cell skin  . Carotid stenosis   . Coronary artery disease   . GERD (gastroesophageal reflux disease)   . HLD (hyperlipidemia)   . Hypertension   . Pre-diabetes   . Stroke ([place])   . Wears hearing aid in both ears     Past Surgical History:  Procedure Laterality Date  . cyst removed    . ENDARTERECTOMY Right 08/04/2019   Procedure: ENDARTERECTOMY CAROTID;  Surgeon: Algernon Huxley, MD;  Location: [facility];  Service: Vascular;  Laterality: Right;  . EXCISION OF TONGUE LESION Bilateral 05/05/2019   Procedure: EXCISION OF DORSAL TONGUE LESION;  Surgeon: Margaretha Sheffield, MD;  Location: [place];  Service: ENT;  Laterality: Bilateral;  . SHOULDER ARTHROSCOPY WITH ROTATOR CUFF REPAIR AND SUBACROMIAL DECOMPRESSION Left 01/09/2020   Procedure: Left shoulder arthroscopic  rotator cuff repair with Regeneten patch, subacromial decompression, and biceps tenodesis;  Surgeon: Leim Fabry, MD;  Location: [facility];  Service: Orthopedics;  Laterality: Left;  . skin cancer removed      There were no  vitals filed for this visit.   Subjective Assessment - 06/11/20 1017    Subjective Patient reports his knee has been bothering him, reports his shoulder is improving.    Pertinent History Right lateral thalamic infarct mild ataxia and mild hemiparesis. Patient went to rehab 07/07/19-07/15/19. Then he had surgery 7/1 for  s/p successful right CEA    How long can you sit comfortably? not related    How long can you stand comfortably? not related    How long can you walk comfortably? not related    Currently in Pain? Yes    Pain Score 2     Pain Location Shoulder    Pain Orientation Left    Pain Descriptors / Indicators Aching    Pain Type Chronic pain    Pain Onset More than a month ago    Pain Frequency Intermittent    Pain Score 4    Pain Location Knee    Pain Orientation Left    Pain Descriptors / Indicators Aching    Pain Type Chronic pain                       Manual:  Supine:  Passive L shoulder ROM with multipoint holds for tension reduction: Flexion 10x 20 second holds, ER 10x 20 second holds  Glenohumeral AP, PA mobilizations grade II with IR/ER bias,  3x 15 seconds for pain reduction.   scapular retraction and depression with overpressure 10x 10 second holds Distraction between interventions for pain relief multiple trials x 10 seconds      Therex: cues for guided motion SciFit 2 mins forward, 2 mins backwards, cues for body mechanics and sequencing; backwards more challenging for patient.    Supine: -bench press with 5lb bar 15x ; cues for keeping elbows to side  - Scapular punches modified LUE 12x ; pt describes to have medium difficulty with technique; 4lb   PNF D1 and D2 10x each LUE ; max cueing for cross body reach due to patient limitation  5lb bar overhead flexion 15x 3 second holds     Seated: -Scapular retraction/row 15x with RTB -ER/IR AROM  RTB 15x with cues for stabilization to side ; x 2 sets  GTB row 15x RTB flexion with foot stepping on band  12x cues for flexion rather than scaption  RTB abduction with foot stepping on band 12x; very limited range 5lb bar: chest press with focus on scapular retraction 15x Alternating single arm row with 5lb bar 10x each arm   3lb dumbbell wood chop 10x each diagonal  UE ranger large clockwise circles 10x, counterclockwise 10x   Pt educated throughout session about proper posture and technique with exercises. Improved exercise technique, movement at target joints, use of target muscles after min to mod verbal, visual, tactile cues  Patient is fatigued today requiring increased encouragement for task performance. Is able to tolerate the progressive strengthening interventions with increased repetitions and sets however he requires constant cueing for decreasing compensatory internal rotation. He is highly motivated despite fatigue. Pt will continue to benefit from skilled therapy to address remaining deficits and will continue with current POC                PT Education - 06/11/20 1018    Education Details exercise technique, body mechanics    Person(s) Educated Patient    Methods Explanation;Demonstration;Tactile cues;Verbal cues    Comprehension Verbalized understanding;Returned demonstration;Verbal cues required;Tactile cues required            PT Short Term Goals - 05/30/20 1238      PT SHORT TERM GOAL #1   Title After 2 weeks pt will demontrate improve P/ROM shoulder flexion 100 degrees FF as per protocol.    Baseline 85 degrees 1/31: 93 degrees 3/3: 112 degrees    Time 2    Period Weeks    Status Achieved    Target Date 03/19/20      PT SHORT TERM GOAL #2   Title After 2 weeks pt will demonstrate correct performance of HEP for P/ROM of shoulder, and AA/ROM of elbow, wrist, and hand.    Baseline Issued at evlauation 1/31: HEP progressed 3/3: HEP compliant 4/27 HEP compliant    Time 2    Period Weeks    Status Achieved    Target Date 04/19/20             PT  Long Term Goals - 05/30/20 1238      PT LONG TERM GOAL #1   Title After 8 weeks pt will demonstrate improved daily function AEB FOTO: >43.    Baseline 22 at evaluation. 1/31: 50% 3/3 58% 4/13: 56%    Time 8    Period Weeks    Status Achieved      PT LONG TERM GOAL #2   Title After 8 weeks pt will demonstrate  Left shoulder flexion >135 degrees; MMT flexion 4/5, ABDCT: 5/5, elbow flexion: 5/5.    Baseline not assessed at eval 1/31: flexion arom 91, MMT 2+/5 3/3: supine AROM 112 degrees MMT 2+ 4/13: seated flexion 108, abduction 113; see note for supine 4/27: seated flexion 116 abduction 114 with shoulder hike    Time 8    Period Weeks    Status Partially Met    Target Date 07/25/20      PT LONG TERM GOAL #3   Title Patient will decrease Quick DASH score by > 8 points (32%)  demonstrating reduced self-reported upper extremity disability.    Baseline 3/3/: 40.9% 4/13: 38.6%    Time 8    Period Weeks    Status Partially Met    Target Date 07/25/20      PT LONG TERM GOAL #4   Title Patient will improve functional ROM and strength of LUE to allow for patient to reach into cabinet overhead and take down items weighing ~5lb.    Baseline 4/27: unable to place or bring down weighted item    Time 8    Period Weeks    Status New    Target Date 07/25/20      PT LONG TERM GOAL #5   Title Patient will return to gardening, demonstrating enough functional strength for manipulation of rake, hoe, and shovel for return to PLOF.    Baseline 4/27: unable to perform    Time 8    Period Weeks    Status New    Target Date 07/25/20                 Plan - 06/11/20 1030    Clinical Impression Statement Patient is fatigued today requiring increased encouragement for task performance. Is able to tolerate the progressive strengthening interventions with increased repetitions and sets however he requires constant cueing for decreasing compensatory internal rotation. He is highly motivated despite  fatigue. Pt will continue to benefit from skilled therapy to address remaining deficits and will continue with current POC    Personal Factors and Comorbidities Age    Comorbidities HTN, Hx CVA    Examination-Activity Limitations Stand;Bathing;Bed Mobility;Reach Overhead;Carry;Sleep    Examination-Participation Restrictions Driving;Yard Work;Meal Prep    Stability/Clinical Decision Making Stable/Uncomplicated    Rehab Potential Good    PT Frequency 2x / week    PT Duration 8 weeks    PT Treatment/Interventions Patient/family education;Balance training;Neuromuscular re-education;Therapeutic exercise;Stair training;Moist Heat;Functional mobility training;Therapeutic activities;Dry needling;Passive range of motion    PT Next Visit Plan Review HEP, continue with P/ROM, begin scapular strength training    PT Home Exercise Plan No updates today.    Consulted and Agree with Plan of Care Patient           Patient will benefit from skilled therapeutic intervention in order to improve the following deficits and impairments:  Decreased activity tolerance,Decreased strength,Difficulty walking,Decreased balance,Decreased coordination,Decreased mobility,Decreased endurance  Visit Diagnosis: Stiffness of left shoulder, not elsewhere classified  Muscle weakness (generalized)  Hemiparesis affecting left side as late effect of stroke [facility])     Problem List Patient Active Problem List   Diagnosis Date Noted  . History of stroke with residual effects 11/17/2019  . Right shoulder pain 10/25/2019  . Weakness 10/25/2019  . Decreased GFR 08/15/2019  . Dyslipidemia 08/15/2019  . AKI (acute kidney injury) ([place]) 08/15/2019  . Hemiparesis affecting left side as late effect of stroke ([place]) 08/15/2019  . Carotid stenosis 07/29/2019  .  Carotid stenosis, right 07/29/2019  . Carotid artery plaque, right 07/19/2019  . Hx of completed stroke 07/19/2019  . Stroke ([place]) 07/06/2019  . TIA (transient ischemic  attack) 07/05/2019  . Hypokalemia 07/05/2019  . GERD without esophagitis 06/18/2017  . Essential hypertension 08/19/2016  . Vertigo 12/05/2015  . Adenomatous colon polyp 01/31/2014  . Basal cell carcinoma 09/23/2013  . Hay fever 09/23/2013  . Vitamin D deficiency 09/23/2013  . Gastric catarrh 11/28/2011  . Hyperlipidemia, mixed 11/28/2011  . Preop cardiovascular exam 11/28/2011   Janna Arch, PT, DPT   06/11/2020, 10:59 AM  [place] MAIN [facility] SERVICES [address] [place], [place], [address] Phone: [phone]   Fax:  [phone]  Name: Russell Herman MRN: [id number] Date of Birth: 1946-01-21

## 2020-06-12 ENCOUNTER — Ambulatory Visit (INDEPENDENT_AMBULATORY_CARE_PROVIDER_SITE_OTHER): Payer: Medicare Other | Admitting: Nurse Practitioner

## 2020-06-12 ENCOUNTER — Ambulatory Visit (INDEPENDENT_AMBULATORY_CARE_PROVIDER_SITE_OTHER): Payer: Medicare Other

## 2020-06-12 ENCOUNTER — Encounter (INDEPENDENT_AMBULATORY_CARE_PROVIDER_SITE_OTHER): Payer: Self-pay | Admitting: Nurse Practitioner

## 2020-06-12 ENCOUNTER — Other Ambulatory Visit: Payer: Self-pay

## 2020-06-12 VITALS — BP 118/68 | HR 75 | Resp 16 | Wt 142.2 lb

## 2020-06-12 DIAGNOSIS — I6521 Occlusion and stenosis of right carotid artery: Secondary | ICD-10-CM | POA: Diagnosis not present

## 2020-06-12 DIAGNOSIS — I6930 Unspecified sequelae of cerebral infarction: Secondary | ICD-10-CM | POA: Diagnosis not present

## 2020-06-12 DIAGNOSIS — E782 Mixed hyperlipidemia: Secondary | ICD-10-CM

## 2020-06-12 NOTE — Progress Notes (Signed)
Subjective:    [name], male    DOB: 12/14/1945, 75 y.o.   MRN: [id number] Chief Complaint  Patient presents with  . Follow-up    Ultrasound follow up    The patient is seen for follow up evaluation of carotid stenosis. The carotid stenosis followed by ultrasound.   The patient denies amaurosis fugax. There is no recent history of CVA with residual deficits.  The patient is taking enteric-coated aspirin 81 mg daily.  There is no history of migraine headaches. There is no history of seizures.  The patient has a history of coronary artery disease, no recent episodes of angina or shortness of breath. The patient denies PAD or claudication symptoms.  The patient does note having a feeling of cold feet.  He notes that the left feels worse than the right.  The patient also has residual deficits following a CVA and he notes that the left side was the most affected.  He notes that it is not consistent.  Other than the school failing he does not have any other symptoms of PAD such as wounds, ulcers, rest pain or claudication.   There is a history of hyperlipidemia which is being treated with a statin.    Carotid Duplex done today shows 1 to 39% in the bilateral internal carotid arteries.  The right endarterectomy site is patent with no evidence of restenosis.   Review of Systems  Skin: Negative for color change.  All other systems reviewed and are negative.      Objective:   Physical Exam Vitals reviewed.  HENT:     Head: Normocephalic.  Cardiovascular:     Rate and Rhythm: Normal rate.     Pulses:          Dorsalis pedis pulses are 2+ on the right side and 2+ on the left side.       Posterior tibial pulses are 2+ on the right side and 2+ on the left side.  Pulmonary:     Effort: Pulmonary effort is normal.  Musculoskeletal:        General: Normal range of motion.  Skin:    General: Skin is warm and dry.  Neurological:     Mental Status: He is alert and  oriented to person, place, and time.  Psychiatric:        Mood and Affect: Mood normal.        Behavior: Behavior normal.        Thought Content: Thought content normal.        Judgment: Judgment normal.     BP 118/68 (BP Location: Right Arm)   Pulse 75   Resp 16   Wt 142 lb 3.2 oz (64.5 kg)   BMI 22.95 kg/m   Past Medical History:  Diagnosis Date  . Adenomatous colon polyp   . Allergy   . Arthritis   . Cancer ([place])    basal cell skin  . Carotid stenosis   . Coronary artery disease   . GERD (gastroesophageal reflux disease)   . HLD (hyperlipidemia)   . Hypertension   . Pre-diabetes   . Stroke ([place])   . Wears hearing aid in both ears     Social History   Socioeconomic History  . Marital status: Married    Spouse name: Not on file  . Number of children: Not on file  . Years of education: Not on file  . Highest education level: Not on file  Occupational  History  . Not on file  Tobacco Use  . Smoking status: Never Smoker  . Smokeless tobacco: Never Used  . Tobacco comment: smoked "some" as teenager  Vaping Use  . Vaping Use: Never used  Substance and Sexual Activity  . Alcohol use: Not Currently    Alcohol/week: 0.0 standard drinks  . Drug use: Not Currently  . Sexual activity: Not on file  Other Topics Concern  . Not on file  Social History Narrative  . Not on file   Social Determinants of Health   Financial Resource Strain: Not on file  Food Insecurity: Not on file  Transportation Needs: Not on file  Physical Activity: Not on file  Stress: Not on file  Social Connections: Not on file  Intimate Partner Violence: Not on file    Past Surgical History:  Procedure Laterality Date  . cyst removed    . ENDARTERECTOMY Right 08/04/2019   Procedure: ENDARTERECTOMY CAROTID;  Surgeon: Algernon Huxley, MD;  Location: [facility];  Service: Vascular;  Laterality: Right;  . EXCISION OF TONGUE LESION Bilateral 05/05/2019   Procedure: EXCISION OF DORSAL TONGUE LESION;   Surgeon: Margaretha Sheffield, MD;  Location: [place];  Service: ENT;  Laterality: Bilateral;  . SHOULDER ARTHROSCOPY WITH ROTATOR CUFF REPAIR AND SUBACROMIAL DECOMPRESSION Left 01/09/2020   Procedure: Left shoulder arthroscopic  rotator cuff repair with Regeneten patch, subacromial decompression, and biceps tenodesis;  Surgeon: Leim Fabry, MD;  Location: [facility];  Service: Orthopedics;  Laterality: Left;  . skin cancer removed      Family History  Problem Relation Age of Onset  . Breast cancer Mother   . Melanoma Father        mets to lung  . Breast cancer Sister   . Kidney failure Brother        s/p transplant    Allergies  Allergen Reactions  . Losartan Rash  . Chlorthalidone Nausea And Vomiting    Can tolerate if taking with protonix   . Keflex [Cephalexin]     "minor reaction"  . Statins Rash    Myalgias, muscle pain/weakness  . Sulfa Antibiotics Rash    CBC Latest Ref Rng & Units 01/05/2020 08/05/2019 08/03/2019  WBC 4.0 - 10.5 K/uL 8.5 22.2(H) 9.1  Hemoglobin 13.0 - 17.0 g/dL 14.2 13.8 14.7  Hematocrit 39.0 - 52.0 % 41.0 39.3 41.7  Platelets 150 - 400 K/uL 323 336 319      CMP     Component Value Date/Time   NA 135 08/05/2019 0500   K 4.2 08/05/2019 0500   CL 103 08/05/2019 0500   CO2 19 (L) 08/05/2019 0500   GLUCOSE 150 (H) 08/05/2019 0500   BUN 19 08/05/2019 0500   CREATININE 0.94 08/05/2019 0500   CALCIUM 8.8 (L) 08/05/2019 0500   PROT 6.4 (L) 07/08/2019 0633   ALBUMIN 3.9 07/08/2019 0633   AST 32 07/08/2019 0633   ALT 24 07/08/2019 0633   ALKPHOS 43 07/08/2019 0633   BILITOT 1.5 (H) 07/08/2019 0633   GFRNONAA >60 08/05/2019 0500   GFRAA >60 08/05/2019 0500     No results found.     Assessment & Plan:   1. Carotid stenosis, right Recommend:  Given the patient's asymptomatic subcritical stenosis no further invasive testing or surgery at this time.  Duplex ultrasound shows less than 40% stenosis bilaterally.  Previous right carotid  endarterectomy is open and patent  Continue antiplatelet therapy as prescribed Continue management of CAD, HTN and Hyperlipidemia Healthy  heart diet,  encouraged exercise at least 4 times per week Follow up in 12 months with duplex ultrasound and physical exam   2. Hyperlipidemia, mixed Continue statin as ordered and reviewed, no changes at this time   3. History of stroke with residual effects Due to the fact that the patient's coldness is situated more so on his left side, which is the site with his residual stroke deficits, this is likely neurological in cause.  The patient has warm feet today with strong pulses which greatly decreases the likelihood that this is related to vascular ischemia.  The patient also does not have any other related vascular symptoms.  Patient is advised to discuss with neurologist and/or primary care provider.   Current Outpatient Medications on File Prior to Visit  Medication Sig Dispense Refill  . acetaminophen (TYLENOL) 325 MG tablet Take 650 mg by mouth every 6 (six) hours as needed for moderate pain or headache.    Marland Kitchen acetaminophen (TYLENOL) 500 MG tablet Take 2 tablets (1,000 mg total) by mouth every 8 (eight) hours. 90 tablet 2  . Cholecalciferol (VITAMIN D) 50 MCG (2000 UT) tablet Take 2,000 Units by mouth daily.    . clopidogrel (PLAVIX) 75 MG tablet Take 1 tablet (75 mg total) by mouth daily. 90 tablet 3  . diphenhydrAMINE (BENADRYL) 25 MG tablet Take 25 mg by mouth daily as needed for itching.    . fluticasone (FLONASE) 50 MCG/ACT nasal spray Place 2 sprays into both nostrils daily as needed for allergies.   4  . hydrocortisone cream 1 % Apply 1 application topically daily as needed for itching.    . pantoprazole (PROTONIX) 40 MG tablet Take 1 tablet (40 mg total) by mouth daily. 90 tablet 3  . Polyethylene Glycol 400 (BLINK TEARS OP) Place 1 drop into both eyes at bedtime.    . Psyllium (METAMUCIL PO) Take 1 Dose by mouth daily.    . rosuvastatin  (CRESTOR) 40 MG tablet Take 1 tablet (40 mg total) by mouth at bedtime. 90 tablet 3  . ondansetron (ZOFRAN ODT) 4 MG disintegrating tablet Take 1 tablet (4 mg total) by mouth every 8 (eight) hours as needed for nausea or vomiting. (Patient not taking: Reported on 06/12/2020) 20 tablet 0  . oxyCODONE (ROXICODONE) 5 MG immediate release tablet Take 1-2 tablets (5-10 mg total) by mouth every 4 (four) hours as needed (pain). (Patient not taking: Reported on 06/12/2020) 30 tablet 0   No current facility-administered medications on file prior to visit.    There are no Patient Instructions on file for this visit. No follow-ups on file.   Kris Hartmann, NP

## 2020-06-13 ENCOUNTER — Ambulatory Visit: Payer: Medicare Other

## 2020-06-13 DIAGNOSIS — M6281 Muscle weakness (generalized): Secondary | ICD-10-CM

## 2020-06-13 DIAGNOSIS — I69354 Hemiplegia and hemiparesis following cerebral infarction affecting left non-dominant side: Secondary | ICD-10-CM

## 2020-06-13 DIAGNOSIS — M25612 Stiffness of left shoulder, not elsewhere classified: Secondary | ICD-10-CM

## 2020-06-13 NOTE — Therapy (Signed)
[place] MAIN [facility] SERVICES [address] [place], [place], [address] Phone: [phone]   Fax:  [phone]  Physical Therapy Treatment  Patient Details  Name: Russell Herman MRN: [id number] Date of Birth: [birth year] Referring Provider (PT): Leim Fabry MD (Orthopedics)   Encounter Date: 06/13/2020   PT End of Session - 06/13/20 1018    Visit Number [id number]    Number of Visits 50    Date for PT Re-Evaluation 07/25/20    Authorization Type 8/10 Pn 4/13    Authorization Time Period 01/13/20-04/06/20; 04/05/20-05/31/20    PT Start Time 1015    PT Stop Time 1058    PT Time Calculation (min) 43 min    Activity Tolerance Patient tolerated treatment well;No increased pain    Behavior During Therapy WFL for tasks assessed/performed           Past Medical History:  Diagnosis Date  . Adenomatous colon polyp   . Allergy   . Arthritis   . Cancer ([place])    basal cell skin  . Carotid stenosis   . Coronary artery disease   . GERD (gastroesophageal reflux disease)   . HLD (hyperlipidemia)   . Hypertension   . Pre-diabetes   . Stroke ([place])   . Wears hearing aid in both ears     Past Surgical History:  Procedure Laterality Date  . cyst removed    . ENDARTERECTOMY Right 08/04/2019   Procedure: ENDARTERECTOMY CAROTID;  Surgeon: Algernon Huxley, MD;  Location: [facility];  Service: Vascular;  Laterality: Right;  . EXCISION OF TONGUE LESION Bilateral 05/05/2019   Procedure: EXCISION OF DORSAL TONGUE LESION;  Surgeon: Margaretha Sheffield, MD;  Location: [place];  Service: ENT;  Laterality: Bilateral;  . SHOULDER ARTHROSCOPY WITH ROTATOR CUFF REPAIR AND SUBACROMIAL DECOMPRESSION Left 01/09/2020   Procedure: Left shoulder arthroscopic  rotator cuff repair with Regeneten patch, subacromial decompression, and biceps tenodesis;  Surgeon: Leim Fabry, MD;  Location: [facility];  Service: Orthopedics;  Laterality: Left;  . skin cancer removed      There were no  vitals filed for this visit.   Subjective Assessment - 06/13/20 1017    Subjective Patient reports his left arm is tingling all the way down to his hand. Went to the doctor yesterday and was told his veins and arteries are doing good. Is very tired feeling.    Pertinent History Right lateral thalamic infarct mild ataxia and mild hemiparesis. Patient went to rehab 07/07/19-07/15/19. Then he had surgery 7/1 for  s/p successful right CEA    How long can you sit comfortably? not related    How long can you stand comfortably? not related    How long can you walk comfortably? not related    Currently in Pain? Yes    Pain Score 2     Pain Location Shoulder    Pain Orientation Left    Pain Descriptors / Indicators Aching    Pain Type Chronic pain    Pain Onset More than a month ago    Pain Frequency Intermittent                    Manual:  Supine:  Passive L shoulder ROM with multipoint holds for tension reduction: Flexion 10x 20 second holds, ER 10x 20 second holds  Glenohumeral AP, PA mobilizations grade II with IR/ER bias, 3x 15 seconds for pain reduction.   scapular retraction and depression with overpressure 10x 10  second holds Distraction between interventions for pain relief multiple trials x 10 seconds      Therex: cues for guided motion SciFit 2 mins forward, 2 mins backwards, cues for body mechanics and sequencing; backwards more challenging for patient.     Supine: -"snow angels" 10x; cues for abduction rather than scaption of LUE  -bench press with 5lb bar 15x ; cues for keeping elbows to side  - Scapular punches modified LUE 12x ; pt describes to have medium difficulty with technique; 4lb   scapular stabilization 30 seconds, very challenging for patient 5lb bar overhead flexion 15x 5 second holds     Seated: -Scapular retraction/row 15x with RTB -RTB ER with PT holding opposite end 15x/ 2 sets -RTB paloff press 15x each side -ER/IR AROM  RTB 15x with cues for  stabilization to side ; x 2 sets  4lb dumbbell wood chop 10x each diagonal  4lb dumbbell lawnmower 15x cueing for shoulder extension with activation  Shoulder abduction "chicken wing" 15x Flexed elbow shoulder flexion 15x focus on a more neutral alignment with flexion  Cross body slow punches 60 seconds  Standing: UE wall slides 10x; occasional assistance for reduction of external rotation Alternating shoulder taps with alt UE on wall 10x each UE Modified wall pushup 10x. Focus on scapular push with a plus   Patient continues to be fatigued but reports physician was not worried about him.  Continued tactile cueing for neutral glenohumeral alignment required due to preference for internal rotation. Patient is able to return to standing close chained interventions this session.  Pt will continue to benefit from skilled therapy to address remaining deficits and will continue with current POC                     PT Education - 06/13/20 1018    Education Details exercise technique, body mechanics    Person(s) Educated Patient    Methods Explanation;Demonstration;Tactile cues;Verbal cues    Comprehension Verbalized understanding;Returned demonstration;Verbal cues required;Tactile cues required            PT Short Term Goals - 05/30/20 1238      PT SHORT TERM GOAL #1   Title After 2 weeks pt will demontrate improve P/ROM shoulder flexion 100 degrees FF as per protocol.    Baseline 85 degrees 1/31: 93 degrees 3/3: 112 degrees    Time 2    Period Weeks    Status Achieved    Target Date 03/19/20      PT SHORT TERM GOAL #2   Title After 2 weeks pt will demonstrate correct performance of HEP for P/ROM of shoulder, and AA/ROM of elbow, wrist, and hand.    Baseline Issued at evlauation 1/31: HEP progressed 3/3: HEP compliant 4/27 HEP compliant    Time 2    Period Weeks    Status Achieved    Target Date 04/19/20             PT Long Term Goals - 05/30/20 1238      PT  LONG TERM GOAL #1   Title After 8 weeks pt will demonstrate improved daily function AEB FOTO: >43.    Baseline 22 at evaluation. 1/31: 50% 3/3 58% 4/13: 56%    Time 8    Period Weeks    Status Achieved      PT LONG TERM GOAL #2   Title After 8 weeks pt will demonstrate Left shoulder flexion >135 degrees; MMT flexion 4/5, ABDCT: 5/5,  elbow flexion: 5/5.    Baseline not assessed at eval 1/31: flexion arom 91, MMT 2+/5 3/3: supine AROM 112 degrees MMT 2+ 4/13: seated flexion 108, abduction 113; see note for supine 4/27: seated flexion 116 abduction 114 with shoulder hike    Time 8    Period Weeks    Status Partially Met    Target Date 07/25/20      PT LONG TERM GOAL #3   Title Patient will decrease Quick DASH score by > 8 points (32%)  demonstrating reduced self-reported upper extremity disability.    Baseline 3/3/: 40.9% 4/13: 38.6%    Time 8    Period Weeks    Status Partially Met    Target Date 07/25/20      PT LONG TERM GOAL #4   Title Patient will improve functional ROM and strength of LUE to allow for patient to reach into cabinet overhead and take down items weighing ~5lb.    Baseline 4/27: unable to place or bring down weighted item    Time 8    Period Weeks    Status New    Target Date 07/25/20      PT LONG TERM GOAL #5   Title Patient will return to gardening, demonstrating enough functional strength for manipulation of rake, hoe, and shovel for return to PLOF.    Baseline 4/27: unable to perform    Time 8    Period Weeks    Status New    Target Date 07/25/20                 Plan - 06/13/20 1044    Clinical Impression Statement Patient continues to be fatigued but reports physician was not worried about him.  Continued tactile cueing for neutral glenohumeral alignment required due to preference for internal rotation. Patient is able to return to standing close chained interventions this session.  Pt will continue to benefit from skilled therapy to address  remaining deficits and will continue with current POC    Personal Factors and Comorbidities Age    Comorbidities HTN, Hx CVA    Examination-Activity Limitations Stand;Bathing;Bed Mobility;Reach Overhead;Carry;Sleep    Examination-Participation Restrictions Driving;Yard Work;Meal Prep    Stability/Clinical Decision Making Stable/Uncomplicated    Rehab Potential Good    PT Frequency 2x / week    PT Duration 8 weeks    PT Treatment/Interventions Patient/family education;Balance training;Neuromuscular re-education;Therapeutic exercise;Stair training;Moist Heat;Functional mobility training;Therapeutic activities;Dry needling;Passive range of motion    PT Next Visit Plan Review HEP, continue with P/ROM, begin scapular strength training    PT Home Exercise Plan No updates today.    Consulted and Agree with Plan of Care Patient           Patient will benefit from skilled therapeutic intervention in order to improve the following deficits and impairments:  Decreased activity tolerance,Decreased strength,Difficulty walking,Decreased balance,Decreased coordination,Decreased mobility,Decreased endurance  Visit Diagnosis: Stiffness of left shoulder, not elsewhere classified  Muscle weakness (generalized)  Hemiparesis affecting left side as late effect of stroke [facility])     Problem List Patient Active Problem List   Diagnosis Date Noted  . Numbness 04/24/2020  . History of stroke with residual effects 11/17/2019  . Right shoulder pain 10/25/2019  . Weakness 10/25/2019  . Decreased GFR 08/15/2019  . Dyslipidemia 08/15/2019  . AKI (acute kidney injury) ([place]) 08/15/2019  . Hemiparesis affecting left side as late effect of stroke ([place]) 08/15/2019  . Carotid stenosis 07/29/2019  . Carotid stenosis, right 07/29/2019  .  Carotid artery plaque, right 07/19/2019  . Hx of completed stroke 07/19/2019  . Stroke ([place]) 07/06/2019  . TIA (transient ischemic attack) 07/05/2019  . Hypokalemia 07/05/2019   . GERD without esophagitis 06/18/2017  . Essential hypertension 08/19/2016  . Vertigo 12/05/2015  . Adenomatous colon polyp 01/31/2014  . Basal cell carcinoma 09/23/2013  . Hay fever 09/23/2013  . Vitamin D deficiency 09/23/2013  . Gastric catarrh 11/28/2011  . Hyperlipidemia, mixed 11/28/2011  . Preop cardiovascular exam 11/28/2011   Janna Arch, PT, DPT   06/13/2020, 11:03 AM  [place] MAIN [facility] SERVICES [address] [place], [place], [address] Phone: [phone]   Fax:  [phone]  Name: VENUS RUHE MRN: [id number] Date of Birth: 1945/11/15

## 2020-06-18 ENCOUNTER — Other Ambulatory Visit: Payer: Self-pay

## 2020-06-18 ENCOUNTER — Ambulatory Visit: Payer: Medicare Other

## 2020-06-18 DIAGNOSIS — I69354 Hemiplegia and hemiparesis following cerebral infarction affecting left non-dominant side: Secondary | ICD-10-CM

## 2020-06-18 DIAGNOSIS — M25612 Stiffness of left shoulder, not elsewhere classified: Secondary | ICD-10-CM

## 2020-06-18 DIAGNOSIS — M6281 Muscle weakness (generalized): Secondary | ICD-10-CM

## 2020-06-18 NOTE — Therapy (Signed)
[place] MAIN [facility] SERVICES [address] [place], [place], [address] Phone: [phone]   Fax:  [phone]  Physical Therapy Treatment  Patient Details  Name: Russell Herman MRN: [id number] Date of Birth: 1945/06/26 Referring Provider (PT): Leim Fabry MD (Orthopedics)   Encounter Date: 06/18/2020   PT End of Session - 06/18/20 1006    Visit Number 39    Number of Visits 50    Date for PT Re-Evaluation 07/25/20    Authorization Type 9/10 Pn 4/13    Authorization Time Period 01/13/20-04/06/20; 04/05/20-05/31/20    PT Start Time 1014    PT Stop Time 1058    PT Time Calculation (min) 44 min    Activity Tolerance Patient tolerated treatment well;No increased pain    Behavior During Therapy WFL for tasks assessed/performed           Past Medical History:  Diagnosis Date  . Adenomatous colon polyp   . Allergy   . Arthritis   . Cancer ([place])    basal cell skin  . Carotid stenosis   . Coronary artery disease   . GERD (gastroesophageal reflux disease)   . HLD (hyperlipidemia)   . Hypertension   . Pre-diabetes   . Stroke ([place])   . Wears hearing aid in both ears     Past Surgical History:  Procedure Laterality Date  . cyst removed    . ENDARTERECTOMY Right 08/04/2019   Procedure: ENDARTERECTOMY CAROTID;  Surgeon: Algernon Huxley, MD;  Location: [facility];  Service: Vascular;  Laterality: Right;  . EXCISION OF TONGUE LESION Bilateral 05/05/2019   Procedure: EXCISION OF DORSAL TONGUE LESION;  Surgeon: Margaretha Sheffield, MD;  Location: [facility];  Service: ENT;  Laterality: Bilateral;  . SHOULDER ARTHROSCOPY WITH ROTATOR CUFF REPAIR AND SUBACROMIAL DECOMPRESSION Left 01/09/2020   Procedure: Left shoulder arthroscopic  rotator cuff repair with Regeneten patch, subacromial decompression, and biceps tenodesis;  Surgeon: Leim Fabry, MD;  Location: [facility];  Service: Orthopedics;  Laterality: Left;  . skin cancer removed      There were no  vitals filed for this visit.   Subjective Assessment - 06/18/20 1015    Subjective Patient reports compliance with with HEP. Is getting eye surgery next week. Was taken off his blood pressure medication.    Pertinent History Right lateral thalamic infarct mild ataxia and mild hemiparesis. Patient went to rehab 07/07/19-07/15/19. Then he had surgery 7/1 for  s/p successful right CEA    How long can you sit comfortably? not related    How long can you stand comfortably? not related    How long can you walk comfortably? not related    Currently in Pain? Yes    Pain Score 2     Pain Location Shoulder    Pain Orientation Left    Pain Descriptors / Indicators Aching    Pain Type Chronic pain    Pain Onset More than a month ago    Pain Frequency Intermittent                 Manual:  Supine:  Passive L shoulder ROM with multipoint holds for tension reduction: Flexion 10x 20 second holds, ER 10x 20 second holds  Glenohumeral AP, PA mobilizations grade II with IR/ER bias, 3x 15 seconds for pain reduction.   scapular retraction and depression with overpressure 10x 10 second holds Distraction between interventions for pain relief multiple trials x 10 seconds  Median nerve glide  10x LUE    Therex: cues for guided motion SciFit 2 mins forward, 2 mins backwards, cues for body mechanics and sequencing; backwards more challenging for patient.     Supine: -"snow angels" 10x; cues for abduction rather than scaption of LUE  -bench press with 5lb bar 15x ; cues for keeping elbows to side  - Scapular punches modified LUE 12x ; pt describes to have medium difficulty with technique; 4lb   scapular stabilization 30 seconds, very challenging for patient 5lb bar overhead flexion 15x 5 second holds   PNF 1 and 2 with RTB resistance 10x each position   Seated: -Scapular retraction/row 15x with RTB -RTB ER/IR  with PT holding opposite end 15x/ 2 sets -RTB paloff press 15x each side -ER/IR AROM  RTB  15x with cues for stabilization to side ; x 2 sets  4lb dumbbell wood chop 10x each diagonal (20x total)  4lb dumbbell lawnmower 15x cueing for shoulder extension with activation  Shoulder abduction "chicken wing" 15x Flexed elbow shoulder flexion 15x focus on a more neutral alignment with flexion  Cross body slow punches 60 seconds   Standing: 6 cones, .25 ankle weight on L wrist, bring to shelf above eye level x 4 trials. Back against wall snow angels 10x  UE wall slides 10x; occasional assistance for reduction of external rotation Modified wall pushup 10x. Focus on scapular push with a plus    Pt educated throughout session about proper posture and technique with exercises. Improved exercise technique, movement at target joints, use of target muscles after min to mod verbal, visual, tactile cues.   Patient aware that next session will address goals and determine if patient will continue therapy or be discharged. Patient agreeable to this. Has been feeling tired resulting in increased weakness due to difficulty with blood pressure monitoring. Anterior delt activation is limited with compensatory shoulder shrug. Pt will continue to benefit from skilled therapy to address remaining deficits and will continue with current POC                       PT Education - 06/18/20 1006    Education Details exercise technique, body mechanics    Person(s) Educated Patient    Methods Explanation;Demonstration;Tactile cues;Verbal cues    Comprehension Verbalized understanding;Returned demonstration;Verbal cues required;Tactile cues required            PT Short Term Goals - 05/30/20 1238      PT SHORT TERM GOAL #1   Title After 2 weeks pt will demontrate improve P/ROM shoulder flexion 100 degrees FF as per protocol.    Baseline 85 degrees 1/31: 93 degrees 3/3: 112 degrees    Time 2    Period Weeks    Status Achieved    Target Date 03/19/20      PT SHORT TERM GOAL #2   Title  After 2 weeks pt will demonstrate correct performance of HEP for P/ROM of shoulder, and AA/ROM of elbow, wrist, and hand.    Baseline Issued at evlauation 1/31: HEP progressed 3/3: HEP compliant 4/27 HEP compliant    Time 2    Period Weeks    Status Achieved    Target Date 04/19/20             PT Long Term Goals - 05/30/20 1238      PT LONG TERM GOAL #1   Title After 8 weeks pt will demonstrate improved daily function AEB FOTO: >43.  Baseline 22 at evaluation. 1/31: 50% 3/3 58% 4/13: 56%    Time 8    Period Weeks    Status Achieved      PT LONG TERM GOAL #2   Title After 8 weeks pt will demonstrate Left shoulder flexion >135 degrees; MMT flexion 4/5, ABDCT: 5/5, elbow flexion: 5/5.    Baseline not assessed at eval 1/31: flexion arom 91, MMT 2+/5 3/3: supine AROM 112 degrees MMT 2+ 4/13: seated flexion 108, abduction 113; see note for supine 4/27: seated flexion 116 abduction 114 with shoulder hike    Time 8    Period Weeks    Status Partially Met    Target Date 07/25/20      PT LONG TERM GOAL #3   Title Patient will decrease Quick DASH score by > 8 points (32%)  demonstrating reduced self-reported upper extremity disability.    Baseline 3/3/: 40.9% 4/13: 38.6%    Time 8    Period Weeks    Status Partially Met    Target Date 07/25/20      PT LONG TERM GOAL #4   Title Patient will improve functional ROM and strength of LUE to allow for patient to reach into cabinet overhead and take down items weighing ~5lb.    Baseline 4/27: unable to place or bring down weighted item    Time 8    Period Weeks    Status New    Target Date 07/25/20      PT LONG TERM GOAL #5   Title Patient will return to gardening, demonstrating enough functional strength for manipulation of rake, hoe, and shovel for return to PLOF.    Baseline 4/27: unable to perform    Time 8    Period Weeks    Status New    Target Date 07/25/20                 Plan - 06/18/20 1026    Clinical  Impression Statement Patient aware that next session will address goals and determine if patient will continue therapy or be discharged. Patient agreeable to this. Has been feeling tired resulting in increased weakness due to difficulty with blood pressure monitoring. Anterior delt activation is limited with compensatory shoulder shrug. Pt will continue to benefit from skilled therapy to address remaining deficits and will continue with current POC    Personal Factors and Comorbidities Age    Comorbidities HTN, Hx CVA    Examination-Activity Limitations Stand;Bathing;Bed Mobility;Reach Overhead;Carry;Sleep    Examination-Participation Restrictions Driving;Yard Work;Meal Prep    Stability/Clinical Decision Making Stable/Uncomplicated    Rehab Potential Good    PT Frequency 2x / week    PT Duration 8 weeks    PT Treatment/Interventions Patient/family education;Balance training;Neuromuscular re-education;Therapeutic exercise;Stair training;Moist Heat;Functional mobility training;Therapeutic activities;Dry needling;Passive range of motion    PT Next Visit Plan goals, assess need for further therapy.    PT Home Exercise Plan No updates today.    Consulted and Agree with Plan of Care Patient           Patient will benefit from skilled therapeutic intervention in order to improve the following deficits and impairments:  Decreased activity tolerance,Decreased strength,Difficulty walking,Decreased balance,Decreased coordination,Decreased mobility,Decreased endurance  Visit Diagnosis: Stiffness of left shoulder, not elsewhere classified  Muscle weakness (generalized)  Hemiparesis affecting left side as late effect of stroke [facility])     Problem List Patient Active Problem List   Diagnosis Date Noted  . Numbness 04/24/2020  . History of  stroke with residual effects 11/17/2019  . Right shoulder pain 10/25/2019  . Weakness 10/25/2019  . Decreased GFR 08/15/2019  . Dyslipidemia 08/15/2019  .  AKI (acute kidney injury) ([place]) 08/15/2019  . Hemiparesis affecting left side as late effect of stroke ([place]) 08/15/2019  . Carotid stenosis 07/29/2019  . Carotid stenosis, right 07/29/2019  . Carotid artery plaque, right 07/19/2019  . Hx of completed stroke 07/19/2019  . Stroke ([place]) 07/06/2019  . TIA (transient ischemic attack) 07/05/2019  . Hypokalemia 07/05/2019  . GERD without esophagitis 06/18/2017  . Essential hypertension 08/19/2016  . Vertigo 12/05/2015  . Adenomatous colon polyp 01/31/2014  . Basal cell carcinoma 09/23/2013  . Hay fever 09/23/2013  . Vitamin D deficiency 09/23/2013  . Gastric catarrh 11/28/2011  . Hyperlipidemia, mixed 11/28/2011  . Preop cardiovascular exam 11/28/2011   Janna Arch, PT, DPT   06/18/2020, 11:00 AM  [place] MAIN [facility] SERVICES [address] [place], [place], [address] Phone: [phone]   Fax:  [phone]  Name: Russell Herman MRN: [id number] Date of Birth: [birth year]

## 2020-06-20 ENCOUNTER — Ambulatory Visit: Payer: Medicare Other

## 2020-06-20 ENCOUNTER — Other Ambulatory Visit: Payer: Self-pay

## 2020-06-20 DIAGNOSIS — I69354 Hemiplegia and hemiparesis following cerebral infarction affecting left non-dominant side: Secondary | ICD-10-CM

## 2020-06-20 DIAGNOSIS — M6281 Muscle weakness (generalized): Secondary | ICD-10-CM

## 2020-06-20 DIAGNOSIS — M25612 Stiffness of left shoulder, not elsewhere classified: Secondary | ICD-10-CM | POA: Diagnosis not present

## 2020-06-20 NOTE — Therapy (Signed)
[place] MAIN [facility] SERVICES [address] [place], [place], [address] Phone: [phone]   Fax:  [phone]  Physical Therapy Treatment Physical Therapy Progress Note/ DISCHARGE   Dates of reporting period  05/16/20   to   06/20/20  Patient Details  Name: Russell Herman MRN: [id number] Date of Birth: April 25, 1945 Referring Provider (PT): Leim Fabry MD (Orthopedics)   Encounter Date: 06/20/2020   PT End of Session - 06/20/20 1025    Visit Number 40    Number of Visits 50    Date for PT Re-Evaluation 07/25/20    Authorization Type 10/10 Pn 4/13    Authorization Time Period 01/13/20-04/06/20; 04/05/20-05/31/20    PT Start Time 1015    PT Stop Time 1055    PT Time Calculation (min) 40 min    Activity Tolerance Patient tolerated treatment well;No increased pain    Behavior During Therapy WFL for tasks assessed/performed           Past Medical History:  Diagnosis Date  . Adenomatous colon polyp   . Allergy   . Arthritis   . Cancer ([place])    basal cell skin  . Carotid stenosis   . Coronary artery disease   . GERD (gastroesophageal reflux disease)   . HLD (hyperlipidemia)   . Hypertension   . Pre-diabetes   . Stroke ([place])   . Wears hearing aid in both ears     Past Surgical History:  Procedure Laterality Date  . cyst removed    . ENDARTERECTOMY Right 08/04/2019   Procedure: ENDARTERECTOMY CAROTID;  Surgeon: Algernon Huxley, MD;  Location: [facility];  Service: Vascular;  Laterality: Right;  . EXCISION OF TONGUE LESION Bilateral 05/05/2019   Procedure: EXCISION OF DORSAL TONGUE LESION;  Surgeon: Margaretha Sheffield, MD;  Location: [facility];  Service: ENT;  Laterality: Bilateral;  . SHOULDER ARTHROSCOPY WITH ROTATOR CUFF REPAIR AND SUBACROMIAL DECOMPRESSION Left 01/09/2020   Procedure: Left shoulder arthroscopic  rotator cuff repair with Regeneten patch, subacromial decompression, and biceps tenodesis;  Surgeon: Leim Fabry, MD;  Location:  [facility];  Service: Orthopedics;  Laterality: Left;  . skin cancer removed      There were no vitals filed for this visit.   Subjective Assessment - 06/20/20 1022    Subjective Patient is aware today may be last session as he has reached optimal movement of shoulder with primary limations being stroke limitations rather then surgical.    Pertinent History Right lateral thalamic infarct mild ataxia and mild hemiparesis. Patient went to rehab 07/07/19-07/15/19. Then he had surgery 7/1 for  s/p successful right CEA    How long can you sit comfortably? not related    How long can you stand comfortably? not related    How long can you walk comfortably? not related    Currently in Pain? Yes    Pain Score 2     Pain Location Shoulder    Pain Orientation Left    Pain Descriptors / Indicators Aching    Pain Type Chronic pain    Pain Onset More than a month ago    Pain Frequency Intermittent                Goals:  L shoulder flexion/abduction ROM and MMT: 117 flexion, 116 abduction : 4/5 strength  Quickdash: 23%  FOTO: 69%  Take object from shelf (5lb): able to perform  Return to gardening : has not attempted:   Treatment:  UE  wall slides 10x; occasional assistance for reduction of external rotation Modified wall pushup 10x. Focus on scapular push with a plus Contralateral shoulder taps with close chained position 10x each UE  Seated: -Scapular retraction/row 15xwith RTB -RTB ER/IR  with PT holding opposite end 15x/ 2 sets -RTB paloff press 15x each side -ER/IR [id number] with cues for stabilization to side; x 2 sets 4lb dumbbell wood chop 10x each diagonal(20x total)  4lb dumbbell lawnmower 15x cueing for shoulder extension with activation  Shoulder abduction "chicken wing" 15x Flexed elbow shoulder flexion 15x focus on a more neutral alignment with flexion  Cross body slow punches 60 seconds   HEP performed with patient demonstrating understanding.    Access Code:  [email or web address] URL: [email or web address] Date: 06/20/2020 Prepared by: Janna Arch  Exercises  . Seated Scapular Retraction - 1 x daily - 7 x weekly - 2 sets - 10 reps - 5 hold . Shoulder Internal Rotation with Resistance - 1 x daily - 7 x weekly - 2 sets - 10 reps - 5 hold . Shoulder External Rotation and Scapular Retraction with Resistance - 1 x daily - 7 x weekly - 2 sets - 10 reps - 5 hold . Shoulder extension with resistance - Neutral - 1 x daily - 7 x weekly - 2 sets - 10 reps - 5 hold . Shoulder Flexion Wall Slide with Towel - 1 x daily - 7 x weekly - 2 sets - 10 reps - 5 hold . Supine Shoulder Flexion with Dowel - 1 x daily - 7 x weekly - 2 sets - 10 reps - 5 hold    Patient's condition has the potential to improve in response to therapy. Maximum improvement is yet to be obtained. The anticipated improvement is attainable and reasonable in a generally predictable time.  Patient reports he feels he has gotten as good as he is going to get with his shoulder.      Patient is aware today is the last session as he has reached optimal movement of shoulder with primary limations being stroke limitations rather then surgical. He demonstrates understanding of need for compliance of HEP to maintain functional range of motion and strength. I will be happy to see this patient again in the future as needed.              PT Education - 06/20/20 1024    Education Details discharge goals    Person(s) Educated Patient    Methods Explanation;Demonstration;Tactile cues;Verbal cues    Comprehension Verbalized understanding;Returned demonstration;Verbal cues required;Tactile cues required            PT Short Term Goals - 06/20/20 1047      PT SHORT TERM GOAL #1   Title After 2 weeks pt will demontrate improve P/ROM shoulder flexion 100 degrees FF as per protocol.    Baseline 85 degrees 1/31: 93 degrees 3/3: 112 degrees    Time 2    Period Weeks    Status Achieved     Target Date 03/19/20      PT SHORT TERM GOAL #2   Title After 2 weeks pt will demonstrate correct performance of HEP for P/ROM of shoulder, and AA/ROM of elbow, wrist, and hand.    Baseline Issued at evlauation 1/31: HEP progressed 3/3: HEP compliant 4/27 HEP compliant    Time 2    Period Weeks    Status Achieved    Target Date 04/19/20  PT Long Term Goals - 06/20/20 1048      PT LONG TERM GOAL #1   Title After 8 weeks pt will demonstrate improved daily function AEB FOTO: >43.    Baseline 22 at evaluation. 1/31: 50% 3/3 58% 4/13: 56%    Time 8    Period Weeks    Status Achieved      PT LONG TERM GOAL #2   Title After 8 weeks pt will demonstrate Left shoulder flexion >135 degrees; MMT flexion 4/5, ABDCT: 5/5, elbow flexion: 5/5.    Baseline not assessed at eval 1/31: flexion arom 91, MMT 2+/5 3/3: supine AROM 112 degrees MMT 2+ 4/13: seated flexion 108, abduction 113; see note for supine 4/27: seated flexion 116 abduction 114 with shoulder hike 5/18: 117 flexion, 116 abduction : 4/5 strength    Time 8    Period Weeks    Status Partially Met      PT LONG TERM GOAL #3   Title Patient will decrease Quick DASH score by > 8 points (32%)  demonstrating reduced self-reported upper extremity disability.    Baseline 3/3/: 40.9% 4/13: 38.6% 5/18: 23%    Time 8    Period Weeks    Status Achieved      PT LONG TERM GOAL #4   Title Patient will improve functional ROM and strength of LUE to allow for patient to reach into cabinet overhead and take down items weighing ~5lb.    Baseline 4/27: unable to place or bring down weighted item 5/18: able to perform    Time 8    Period Weeks    Status Achieved      PT LONG TERM GOAL #5   Title Patient will return to gardening, demonstrating enough functional strength for manipulation of rake, hoe, and shovel for return to PLOF.    Baseline 4/27: unable to perform 5/18: has not attempted yet    Time 8    Period Weeks    Status  On-going                 Plan - 06/20/20 1243    Clinical Impression Statement Patient is aware today is the last session as he has reached optimal movement of shoulder with primary limations being stroke limitations rather then surgical. He demonstrates understanding of need for compliance of HEP to maintain functional range of motion and strength. I will be happy to see this patient again in the future as needed.    Personal Factors and Comorbidities Age    Comorbidities HTN, Hx CVA    Examination-Activity Limitations Stand;Bathing;Bed Mobility;Reach Overhead;Carry;Sleep    Examination-Participation Restrictions Driving;Yard Work;Meal Prep    Stability/Clinical Decision Making Stable/Uncomplicated    Rehab Potential Good    PT Frequency 2x / week    PT Duration 8 weeks    PT Treatment/Interventions Patient/family education;Balance training;Neuromuscular re-education;Therapeutic exercise;Stair training;Moist Heat;Functional mobility training;Therapeutic activities;Dry needling;Passive range of motion    PT Next Visit Plan goals, assess need for further therapy.    PT Home Exercise Plan No updates today.    Consulted and Agree with Plan of Care Patient           Patient will benefit from skilled therapeutic intervention in order to improve the following deficits and impairments:  Decreased activity tolerance,Decreased strength,Difficulty walking,Decreased balance,Decreased coordination,Decreased mobility,Decreased endurance  Visit Diagnosis: Stiffness of left shoulder, not elsewhere classified  Muscle weakness (generalized)  Hemiparesis affecting left side as late effect of stroke ([facility])  Problem List Patient Active Problem List   Diagnosis Date Noted  . Numbness 04/24/2020  . History of stroke with residual effects 11/17/2019  . Right shoulder pain 10/25/2019  . Weakness 10/25/2019  . Decreased GFR 08/15/2019  . Dyslipidemia 08/15/2019  . AKI (acute kidney  injury) ([place]) 08/15/2019  . Hemiparesis affecting left side as late effect of stroke ([place]) 08/15/2019  . Carotid stenosis 07/29/2019  . Carotid stenosis, right 07/29/2019  . Carotid artery plaque, right 07/19/2019  . Hx of completed stroke 07/19/2019  . Stroke ([place]) 07/06/2019  . TIA (transient ischemic attack) 07/05/2019  . Hypokalemia 07/05/2019  . GERD without esophagitis 06/18/2017  . Essential hypertension 08/19/2016  . Vertigo 12/05/2015  . Adenomatous colon polyp 01/31/2014  . Basal cell carcinoma 09/23/2013  . Hay fever 09/23/2013  . Vitamin D deficiency 09/23/2013  . Gastric catarrh 11/28/2011  . Hyperlipidemia, mixed 11/28/2011  . Preop cardiovascular exam 11/28/2011   Janna Arch, PT, DPT   06/20/2020, 12:45 PM  [place] MAIN [facility] SERVICES [address] [place], [place], [address] Phone: [phone]   Fax:  [phone]  Name: Russell Herman MRN: [id number] Date of Birth: 03/14/1945

## 2020-06-25 ENCOUNTER — Ambulatory Visit: Payer: Medicare Other

## 2021-02-17 ENCOUNTER — Ambulatory Visit
Admission: EM | Admit: 2021-02-17 | Discharge: 2021-02-17 | Disposition: A | Discharge status: Home / Self Care | Payer: Medicare Other | Attending: Emergency Medicine | Admitting: Emergency Medicine

## 2021-02-17 DIAGNOSIS — Z20822 Contact with and (suspected) exposure to covid-19: Secondary | ICD-10-CM | POA: Diagnosis not present

## 2021-02-17 DIAGNOSIS — J069 Acute upper respiratory infection, unspecified: Secondary | ICD-10-CM | POA: Diagnosis present

## 2021-02-17 LAB — RESP PANEL BY RT-PCR (FLU A&B, COVID) ARPGX2
Influenza A by PCR: NEGATIVE
Influenza B by PCR: NEGATIVE
SARS Coronavirus 2 by RT PCR: NEGATIVE

## 2021-02-17 MED ORDER — IPRATROPIUM BROMIDE 0.06 % NA SOLN: 2.0000 | Freq: Four times a day (QID) | NASAL | 12 refills | Status: DC

## 2021-02-17 MED ORDER — BENZONATATE 100 MG PO CAPS: 200.0000 mg | ORAL_CAPSULE | Freq: Three times a day (TID) | ORAL | 0 refills | Status: DC

## 2021-02-17 NOTE — ED Triage Notes (Signed)
Patient is here for "Flu like symptoms". Started yesterday. Symptoms include "cough, ha, sinus stopped up, throat sensitive". Coughing up "white stuff". No fever. No COVID19 testing recently. COVID19 vaccines done. Flu vaccine done.

## 2021-02-17 NOTE — ED Provider Notes (Signed)
[facility]    CSN: [id number] Arrival date & time: 02/17/21  0847      History   Chief Complaint Chief Complaint  Patient presents with   Flu Like Symptoms    HPI Russell Herman is a 76 y.o. male.   HPI  76 year old male here for evaluation of respiratory complaints.  Patient ports that he has been experiencing "flulike symptoms" since yesterday.  The symptoms include headache, sinus pressure, clear nasal discharge, ear pressure, sore throat, and an intermittent cough for white sputum.  He denies any fever, shortness of breath or wheezing, body aches, or GI complaints.  Past Medical History:  Diagnosis Date   Adenomatous colon polyp    Allergy    Arthritis    Cancer ([place])    basal cell skin   Carotid stenosis    Coronary artery disease    GERD (gastroesophageal reflux disease)    HLD (hyperlipidemia)    Hypertension    Pre-diabetes    Stroke [facility])    Wears hearing aid in both ears     Patient Active Problem List   Diagnosis Date Noted   Numbness 04/24/2020   History of stroke with residual effects 11/17/2019   Right shoulder pain 10/25/2019   Weakness 10/25/2019   Decreased GFR 08/15/2019   Dyslipidemia 08/15/2019   AKI (acute kidney injury) ([place]) 08/15/2019   Hemiparesis affecting left side as late effect of stroke ([place]) 08/15/2019   Carotid stenosis 07/29/2019   Carotid stenosis, right 07/29/2019   Carotid artery plaque, right 07/19/2019   Hx of completed stroke 07/19/2019   Stroke ([place]) 07/06/2019   TIA (transient ischemic attack) 07/05/2019   Hypokalemia 07/05/2019   GERD without esophagitis 06/18/2017   Essential hypertension 08/19/2016   Vertigo 12/05/2015   Adenomatous colon polyp 01/31/2014   Basal cell carcinoma 09/23/2013   Hay fever 09/23/2013   Vitamin D deficiency 09/23/2013   Gastric catarrh 11/28/2011   Hyperlipidemia, mixed 11/28/2011   Preop cardiovascular exam 11/28/2011    Past Surgical History:  Procedure  Laterality Date   cyst removed     ENDARTERECTOMY Right 08/04/2019   Procedure: ENDARTERECTOMY CAROTID;  Surgeon: Algernon Huxley, MD;  Location: [facility];  Service: Vascular;  Laterality: Right;   EXCISION OF TONGUE LESION Bilateral 05/05/2019   Procedure: EXCISION OF DORSAL TONGUE LESION;  Surgeon: Margaretha Sheffield, MD;  Location: [place];  Service: ENT;  Laterality: Bilateral;   SHOULDER ARTHROSCOPY WITH ROTATOR CUFF REPAIR AND SUBACROMIAL DECOMPRESSION Left 01/09/2020   Procedure: Left shoulder arthroscopic  rotator cuff repair with Regeneten patch, subacromial decompression, and biceps tenodesis;  Surgeon: Leim Fabry, MD;  Location: [facility];  Service: Orthopedics;  Laterality: Left;   skin cancer removed         Home Medications    Prior to Admission medications   Medication Sig Start Date End Date Taking? Authorizing Provider  benzonatate (TESSALON) 100 MG capsule Take 2 capsules (200 mg total) by mouth every 8 (eight) hours. 02/17/21  Yes Margarette Canada, NP  Cholecalciferol (VITAMIN D) 50 MCG (2000 UT) tablet Take 2,000 Units by mouth daily.   Yes [provider]  clopidogrel (PLAVIX) 75 MG tablet Take 1 tablet (75 mg total) by mouth daily. 08/05/19  Yes Stegmayer, Joelene Millin A, PA-C  finasteride (PROSCAR) 5 MG tablet Take by mouth. 07/18/20 07/18/21 Yes [provider]  ipratropium (ATROVENT) 0.06 % nasal spray Place 2 sprays into both nostrils 4 (four) times daily. 02/17/21  Yes  Margarette Canada, NP  pantoprazole (PROTONIX) 40 MG tablet Take 1 tablet (40 mg total) by mouth daily. 08/05/19  Yes Stegmayer, Janalyn Harder, PA-C  pregabalin (LYRICA) 25 MG capsule Start taking lyrica 25mg  twice daily for two weeks, then increase to 50mg  twice daily. 12/18/20  Yes [provider]  Psyllium (METAMUCIL PO) Take 1 Dose by mouth daily.   Yes [provider]  rosuvastatin (CRESTOR) 40 MG tablet Take 1 tablet (40 mg total) by mouth at bedtime. 08/05/19  Yes Stegmayer,  Janalyn Harder, PA-C  UNABLE TO FIND Med Name: BP Medication, PRN.   Yes [provider]  acetaminophen (TYLENOL) 325 MG tablet Take 650 mg by mouth every 6 (six) hours as needed for moderate pain or headache.    [provider]  diphenhydrAMINE (BENADRYL) 25 MG tablet Take 25 mg by mouth daily as needed for itching.    [provider]  fluticasone (FLONASE) 50 MCG/ACT nasal spray Place 2 sprays into both nostrils daily as needed for allergies.  03/08/14   [provider]  hydrocortisone cream 1 % Apply 1 application topically daily as needed for itching.    [provider]  ondansetron (ZOFRAN ODT) 4 MG disintegrating tablet Take 1 tablet (4 mg total) by mouth every 8 (eight) hours as needed for nausea or vomiting. Patient not taking: Reported on 06/12/2020 01/09/20   Leim Fabry, MD  Polyethylene Glycol 400 (BLINK TEARS OP) Place 1 drop into both eyes at bedtime.    [provider]    Family History Family History  Problem Relation Age of Onset   Breast cancer Mother    Melanoma Father        mets to lung   Breast cancer Sister    Kidney failure Brother        s/p transplant    Social History Social History   Tobacco Use   Smoking status: Never   Smokeless tobacco: Never   Tobacco comments:    smoked "some" as teenager  Vaping Use   Vaping Use: Never used  Substance Use Topics   Alcohol use: Not Currently    Alcohol/week: 0.0 standard drinks   Drug use: Not Currently     Allergies   Losartan, Chlorthalidone, Keflex [cephalexin], Statins, and Sulfa antibiotics   Review of Systems Review of Systems  Constitutional:  Negative for activity change, appetite change and fever.  HENT:  Positive for congestion, ear pain, rhinorrhea, sinus pressure and sore throat.   Respiratory:  Positive for cough. Negative for shortness of breath and wheezing.   Gastrointestinal:  Negative for diarrhea, nausea and vomiting.  Musculoskeletal:   Negative for arthralgias and myalgias.  Skin:  Negative for rash.  Neurological:  Positive for headaches.  Hematological: Negative.   Psychiatric/Behavioral: Negative.      Physical Exam Triage Vital Signs ED Triage Vitals  Enc Vitals Group     BP 02/17/21 0912 110/67     Pulse Rate 02/17/21 0912 83     Resp 02/17/21 0912 18     Temp 02/17/21 0912 98.1 F (36.7 C)     Temp Source 02/17/21 0912 Oral     SpO2 02/17/21 0912 98 %     Weight 02/17/21 0908 150 lb (68 kg)     Height 02/17/21 0908 5\' 6"  (1.676 m)     Head Circumference --      Peak Flow --      Pain Score 02/17/21 0908 0  Pain Loc --      Pain Edu? --      Excl. in [place]? --    No data found.  Updated Vital Signs BP 110/67 (BP Location: Left Arm)    Pulse 83    Temp 98.1 F (36.7 C) (Oral)    Resp 18    Ht 5\' 6"  (1.676 m)    Wt 150 lb (68 kg)    SpO2 98%    BMI 24.21 kg/m   Visual Acuity Right Eye Distance:   Left Eye Distance:   Bilateral Distance:    Right Eye Near:   Left Eye Near:    Bilateral Near:     Physical Exam Vitals and nursing note reviewed.  Constitutional:      General: He is not in acute distress.    Appearance: Normal appearance. He is not ill-appearing.  HENT:     Head: Normocephalic and atraumatic.     Right Ear: Tympanic membrane, ear canal and external ear normal. There is no impacted cerumen.     Left Ear: Tympanic membrane, ear canal and external ear normal. There is no impacted cerumen.     Nose: Congestion and rhinorrhea present.     Mouth/Throat:     Mouth: Mucous membranes are moist.     Pharynx: Oropharynx is clear. Posterior oropharyngeal erythema present.  Cardiovascular:     Rate and Rhythm: Normal rate and regular rhythm.     Pulses: Normal pulses.     Heart sounds: Normal heart sounds. No murmur heard.   No friction rub. No gallop.  Pulmonary:     Effort: Pulmonary effort is normal.     Breath sounds: Normal breath sounds. No wheezing, rhonchi or rales.   Musculoskeletal:     Cervical back: Normal range of motion and neck supple.  Lymphadenopathy:     Cervical: No cervical adenopathy.  Skin:    General: Skin is warm and dry.     Capillary Refill: Capillary refill takes less than 2 seconds.     Findings: No erythema or rash.  Neurological:     General: No focal deficit present.     Mental Status: He is alert and oriented to person, place, and time.  Psychiatric:        Mood and Affect: Mood normal.        Behavior: Behavior normal.        Thought Content: Thought content normal.        Judgment: Judgment normal.     UC Treatments / Results  Labs (all labs ordered are listed, but only abnormal results are displayed) Labs Reviewed  RESP PANEL BY RT-PCR (FLU A&B, COVID) [id number]    EKG   Radiology No results found.  Procedures Procedures (including critical care time)  Medications Ordered in UC Medications - No data to display  Initial Impression / Assessment and Plan / UC Course  I have reviewed the triage vital signs and the nursing notes.  Pertinent labs & imaging results that were available during my care of the patient were reviewed by me and considered in my medical decision making (see chart for details).  Patient is a very pleasant, nontoxic-appearing 76 year old male here for evaluation of respiratory symptoms as outlined HPI above.  He is concerned that he may have influenza but he denies any fever.  Physical exam reveals pearly gray tympanic membranes bilaterally with normal light reflex and clear external auditory canals.  Nasal mucosa is erythematous and markedly edematous.  There is scant clear discharge in both nares.  Patient also has mild posterior oropharyngeal erythema with clear postnasal drip.  No cervical adenopathy appreciated exam.  Cardiopulmonary exam shows clear lung sounds in all fields.  Respiratory Plex panel was collected in triage and is negative for COVID and influenza.  Will discharge patient  home with a diagnosis of viral URI with a cough and treat with Atrovent nasal spray to help with nasal congestion and postnasal drip, which I believe is feeding his cough, and Tessalon Perles to help with coughing today.  He may use Robitussin, Delsym, or Zarbee's over-the-counter as needed for worsening cough symptoms.  I have also instructed patient to return for new or worsening symptoms.   Final Clinical Impressions(s) / UC Diagnoses   Final diagnoses:  Viral URI with cough     Discharge Instructions      Use the Atrovent nasal spray, 2 squirts in each nostril every 6 hours, as needed for runny nose and postnasal drip.  Use the Tessalon Perles every 8 hours during the day.  Take them with a small sip of water.  They may give you some numbness to the base of your tongue or a metallic taste in your mouth, this is normal.  Use Robitussin DM cough syrup at bedtime for cough and congestion.  It will make you drowsy so do not take it during the day.  Return for reevaluation or see your primary care provider for any new or worsening symptoms.      ED Prescriptions     Medication Sig Dispense Auth. Provider   ipratropium (ATROVENT) 0.06 % nasal spray Place 2 sprays into both nostrils 4 (four) times daily. 15 mL Margarette Canada, NP   benzonatate (TESSALON) 100 MG capsule Take 2 capsules (200 mg total) by mouth every 8 (eight) hours. 21 capsule Margarette Canada, NP      PDMP not reviewed this encounter.   Margarette Canada, NP 02/17/21 1018

## 2021-02-17 NOTE — Discharge Instructions (Signed)
Use the Atrovent nasal spray, 2 squirts in each nostril every 6 hours, as needed for runny nose and postnasal drip.  Use the Tessalon Perles every 8 hours during the day.  Take them with a small sip of water.  They may give you some numbness to the base of your tongue or a metallic taste in your mouth, this is normal.  Use Robitussin DM cough syrup at bedtime for cough and congestion.  It will make you drowsy so do not take it during the day.  Return for reevaluation or see your primary care provider for any new or worsening symptoms.

## 2021-06-10 ENCOUNTER — Other Ambulatory Visit (INDEPENDENT_AMBULATORY_CARE_PROVIDER_SITE_OTHER): Payer: Self-pay | Admitting: Vascular Surgery

## 2021-06-10 DIAGNOSIS — I779 Disorder of arteries and arterioles, unspecified: Secondary | ICD-10-CM

## 2021-06-11 ENCOUNTER — Ambulatory Visit (INDEPENDENT_AMBULATORY_CARE_PROVIDER_SITE_OTHER): Payer: Medicare Other | Admitting: Vascular Surgery

## 2021-06-11 ENCOUNTER — Ambulatory Visit (INDEPENDENT_AMBULATORY_CARE_PROVIDER_SITE_OTHER): Payer: Medicare Other

## 2021-06-11 ENCOUNTER — Encounter (INDEPENDENT_AMBULATORY_CARE_PROVIDER_SITE_OTHER): Payer: Self-pay | Admitting: Vascular Surgery

## 2021-06-11 VITALS — BP 118/69 | HR 60 | Resp 16 | Wt 153.0 lb

## 2021-06-11 DIAGNOSIS — I63331 Cerebral infarction due to thrombosis of right posterior cerebral artery: Secondary | ICD-10-CM

## 2021-06-11 DIAGNOSIS — I6521 Occlusion and stenosis of right carotid artery: Secondary | ICD-10-CM

## 2021-06-11 DIAGNOSIS — I10 Essential (primary) hypertension: Secondary | ICD-10-CM | POA: Diagnosis not present

## 2021-06-11 DIAGNOSIS — I779 Disorder of arteries and arterioles, unspecified: Secondary | ICD-10-CM

## 2021-06-11 DIAGNOSIS — E782 Mixed hyperlipidemia: Secondary | ICD-10-CM | POA: Diagnosis not present

## 2021-06-11 NOTE — Progress Notes (Signed)
? ? ?MRN : [id number] ? ?Russell Herman is a 76 y.o. (08-Oct-1945) male who presents with chief complaint of  ?Chief Complaint  ?Patient presents with  ? Follow-up  ?  Ultrasound follow up  ?. ? ?History of Present Illness: Patient returns in follow-up of his carotid disease.  He is coming up on 2 years status post right carotid endarterectomy for high-grade symptomatic stenosis.  He is doing well.  No focal neurologic symptoms or problems since his last visit. Carotid duplex today reveals his right carotid endarterectomy to be patent with mildly elevated velocities that are stable and not concerning.  Left carotid velocities in the 1 to 39% range. ? ?Current Outpatient Medications  ?Medication Sig Dispense Refill  ? acetaminophen (TYLENOL) 325 MG tablet Take 650 mg by mouth every 6 (six) hours as needed for moderate pain or headache.    ? benzonatate (TESSALON) 100 MG capsule Take 2 capsules (200 mg total) by mouth every 8 (eight) hours. 21 capsule 0  ? Cholecalciferol (VITAMIN D) 50 MCG (2000 UT) tablet Take 2,000 Units by mouth daily.    ? clopidogrel (PLAVIX) 75 MG tablet Take 1 tablet (75 mg total) by mouth daily. 90 tablet 3  ? diphenhydrAMINE (BENADRYL) 25 MG tablet Take 25 mg by mouth daily as needed for itching.    ? finasteride (PROSCAR) 5 MG tablet Take by mouth.    ? fluticasone (FLONASE) 50 MCG/ACT nasal spray Place 2 sprays into both nostrils daily as needed for allergies.   4  ? hydrocortisone cream 1 % Apply 1 application topically daily as needed for itching.    ? pantoprazole (PROTONIX) 40 MG tablet Take 1 tablet (40 mg total) by mouth daily. 90 tablet 3  ? Polyethylene Glycol 400 (BLINK TEARS OP) Place 1 drop into both eyes at bedtime.    ? pregabalin (LYRICA) 25 MG capsule Start taking lyrica '25mg'$  twice daily for two weeks, then increase to '50mg'$  twice daily.    ? Psyllium (METAMUCIL PO) Take 1 Dose by mouth daily.    ? rosuvastatin (CRESTOR) 40 MG tablet Take 1 tablet (40 mg total) by mouth at  bedtime. 90 tablet 3  ? UNABLE TO FIND Med Name: BP Medication, PRN.    ? ipratropium (ATROVENT) 0.06 % nasal spray Place 2 sprays into both nostrils 4 (four) times daily. (Patient not taking: Reported on 06/11/2021) 15 mL 12  ? ondansetron (ZOFRAN ODT) 4 MG disintegrating tablet Take 1 tablet (4 mg total) by mouth every 8 (eight) hours as needed for nausea or vomiting. (Patient not taking: Reported on 06/12/2020) 20 tablet 0  ? ?No current facility-administered medications for this visit.  ? ? ?Past Medical History:  ?Diagnosis Date  ? Adenomatous colon polyp   ? Allergy   ? Arthritis   ? Cancer [facility])   ? basal cell skin  ? Carotid stenosis   ? Coronary artery disease   ? GERD (gastroesophageal reflux disease)   ? HLD (hyperlipidemia)   ? Hypertension   ? Pre-diabetes   ? Stroke [facility]   ? Wears hearing aid in both ears   ? ? ?Past Surgical History:  ?Procedure Laterality Date  ? cyst removed    ? ENDARTERECTOMY Right 08/04/2019  ? Procedure: ENDARTERECTOMY CAROTID;  Surgeon: Algernon Huxley, MD;  Location: [facility];  Service: Vascular;  Laterality: Right;  ? EXCISION OF TONGUE LESION Bilateral 05/05/2019  ? Procedure: EXCISION OF DORSAL TONGUE LESION;  Surgeon: Margaretha Sheffield, MD;  Location: [place];  Service: ENT;  Laterality: Bilateral;  ? SHOULDER ARTHROSCOPY WITH ROTATOR CUFF REPAIR AND SUBACROMIAL DECOMPRESSION Left 01/09/2020  ? Procedure: Left shoulder arthroscopic  rotator cuff repair with Regeneten patch, subacromial decompression, and biceps tenodesis;  Surgeon: Leim Fabry, MD;  Location: [facility];  Service: Orthopedics;  Laterality: Left;  ? skin cancer removed    ? ? ? ?Social History  ? ?Tobacco Use  ? Smoking status: Never  ? Smokeless tobacco: Never  ? Tobacco comments:  ?  smoked "some" as teenager  ?Vaping Use  ? Vaping Use: Never used  ?Substance Use Topics  ? Alcohol use: Not Currently  ?  Alcohol/week: 0.0 standard drinks  ? Drug use: Not Currently  ? ? ? ? ?Family History  ?Problem Relation  Age of Onset  ? Breast cancer Mother   ? Melanoma Father   ?     mets to lung  ? Breast cancer Sister   ? Kidney failure Brother   ?     s/p transplant  ? ? ? ?Allergies  ?Allergen Reactions  ? Losartan Rash  ? Chlorthalidone Nausea And Vomiting  ?  Can tolerate if taking with protonix   ? Cephalexin   ?  "minor reaction" ?Other reaction(s): Unknown ?"minor reaction"  ? Statins Rash  ?  Myalgias, muscle pain/weakness  ? Sulfa Antibiotics Rash  ? ? ?REVIEW OF SYSTEMS (Negative unless checked) ?  ?Constitutional: '[]'$ Weight loss  '[]'$ Fever  '[]'$ Chills ?Cardiac: '[]'$ Chest pain   '[]'$ Chest pressure   '[]'$ Palpitations   '[]'$ Shortness of breath when laying flat   '[]'$ Shortness of breath at rest   '[]'$ Shortness of breath with exertion. ?Vascular:  '[]'$ Pain in legs with walking   '[]'$ Pain in legs at rest   '[]'$ Pain in legs when laying flat   '[]'$ Claudication   '[]'$ Pain in feet when walking  '[]'$ Pain in feet at rest  '[]'$ Pain in feet when laying flat   '[]'$ History of DVT   '[]'$ Phlebitis   '[]'$ Swelling in legs   '[]'$ Varicose veins   '[]'$ Non-healing ulcers ?Pulmonary:   '[]'$ Uses home oxygen   '[]'$ Productive cough   '[]'$ Hemoptysis   '[]'$ Wheeze  '[]'$ COPD   '[]'$ Asthma ?Neurologic:  '[]'$ Dizziness  '[]'$ Blackouts   '[]'$ Seizures   '[x]'$ History of stroke   '[]'$ History of TIA  '[]'$ Aphasia   '[]'$ Temporary blindness   '[]'$ Dysphagia   '[x]'$ Weakness or numbness in arms   '[x]'$ Weakness or numbness in legs ?Musculoskeletal:  '[x]'$ Arthritis   '[]'$ Joint swelling   '[x]'$ Joint pain   '[]'$ Low back pain ?Hematologic:  '[]'$ Easy bruising  '[]'$ Easy bleeding   '[]'$ Hypercoagulable state   '[]'$ Anemic  '[]'$ Hepatitis ?Gastrointestinal:  '[]'$ Blood in stool   '[]'$ Vomiting blood  '[]'$ Gastroesophageal reflux/heartburn   '[]'$ Difficulty swallowing. ?Genitourinary:  '[]'$ Chronic kidney disease   '[]'$ Difficult urination  '[]'$ Frequent urination  '[]'$ Burning with urination   '[]'$ Blood in urine ?Skin:  '[]'$ Rashes   '[]'$ Ulcers   '[]'$ Wounds ?Psychological:  '[]'$ History of anxiety   '[]'$  History of major depression. ? ? ?Physical Examination ? ?Vitals:  ? 06/11/21 1054  ?BP: 118/69   ?Pulse: 60  ?Resp: 16  ?Weight: 153 lb (69.4 kg)  ? ?Body mass index is 24.69 kg/m?. ?Gen:  WD/WN, NAD ?Head: [place]/AT, No temporalis wasting. ?Ear/Nose/Throat: Hearing grossly intact, nares w/o erythema or drainage, trachea midline ?Eyes: Conjunctiva clear. Sclera non-icteric ?Neck: Supple.  No bruit  ?Pulmonary:  Good air movement, equal and clear to auscultation bilaterally.  ?Cardiac: RRR, No JVD ?Vascular:  ?Vessel Right Left  ?Radial Palpable Palpable  ?    ?    ?    ? ?  Musculoskeletal: M/S 5/5 throughout.  No deformity or atrophy. No edema. ?Neurologic: CN 2-12 intact. Sensation grossly intact in extremities.  Symmetrical.  Speech is fluent. Motor exam as listed above. ?Psychiatric: Judgment intact, Mood & affect appropriate for pt's clinical situation. ?Dermatologic: No rashes or ulcers noted.  No cellulitis or open wounds. ? ? ? ? ?CBC ?Lab Results  ?Component Value Date  ? WBC 8.5 01/05/2020  ? HGB 14.2 01/05/2020  ? HCT 41.0 01/05/2020  ? MCV 85.8 01/05/2020  ? PLT 323 01/05/2020  ? ? ?BMET ?   ?Component Value Date/Time  ? NA 135 08/05/2019 0500  ? K 4.2 08/05/2019 0500  ? CL 103 08/05/2019 0500  ? CO2 19 (L) 08/05/2019 0500  ? GLUCOSE 150 (H) 08/05/2019 0500  ? BUN 19 08/05/2019 0500  ? CREATININE 0.94 08/05/2019 0500  ? CALCIUM 8.8 (L) 08/05/2019 0500  ? GFRNONAA >60 08/05/2019 0500  ? GFRAA >60 08/05/2019 0500  ? ?CrCl cannot be calculated (Patient's most recent lab result is older than the maximum 21 days allowed.). ? ?COAG ?Lab Results  ?Component Value Date  ? INR 1.1 08/03/2019  ? INR 1.1 07/05/2019  ? ? ?Radiology ?No results found. ? ? ?Assessment/Plan ?Essential hypertension ?blood pressure control important in reducing the progression of atherosclerotic disease. On appropriate oral medications. ?  ?  ?Stroke [facility]) ?Had a stroke preoperatively from his carotid disease.  No symptoms after the surgery.  Still improving. ?  ?Hyperlipidemia, mixed ?lipid control important in reducing the progression  of atherosclerotic disease. Continue statin therapy ? ?Carotid stenosis ?Carotid duplex today reveals his right carotid endarterectomy to be patent with mildly elevated velocities that are stable and not concer

## 2021-06-11 NOTE — Assessment & Plan Note (Signed)
Carotid duplex today reveals his right carotid endarterectomy to be patent with mildly elevated velocities that are stable and not concerning.  Left carotid velocities in the 1 to 39% range.  Continue to follow annually.  Continue current medical regimen. ?

## 2021-07-02 IMAGING — MR MR SHOULDER*L* W/O CM
4 of 5 series · 30 of 40 positions shown · non-contrast
Comparison: None.

CLINICAL DATA: Left shoulder pain for 1-2 months. No specific
injury.

EXAM:
MRI OF THE LEFT SHOULDER WITHOUT CONTRAST
TECHNIQUE: Multiplanar, multisequence MR imaging of the shoulder was performed.
No intravenous contrast was administered.

[Series 5: T2 fat-sat · axial · left · 4.0mm · 0.44mm/px · z∈[-48,+72]mm · 8 of 26 slices shown (1 of 3)]
[im 1/26]
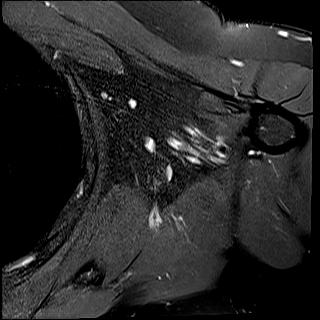
[im 4/26]
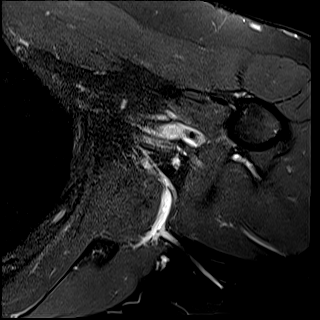
[im 8/26]
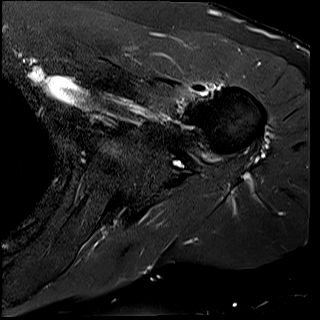
[im 11/26]
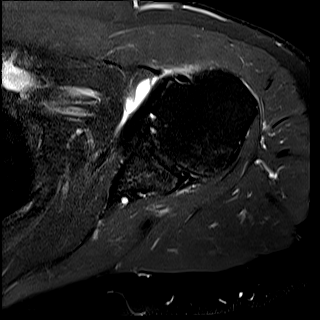
[im 15/26]
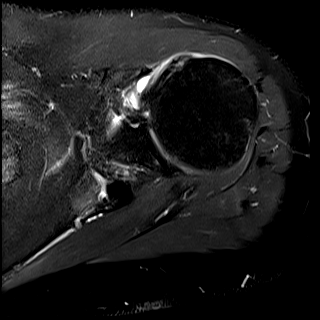
[im 18/26]
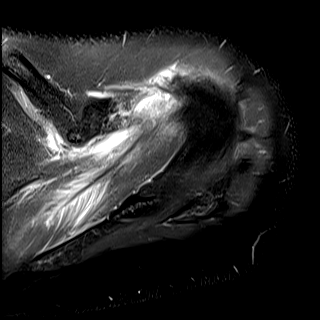
[im 22/26]
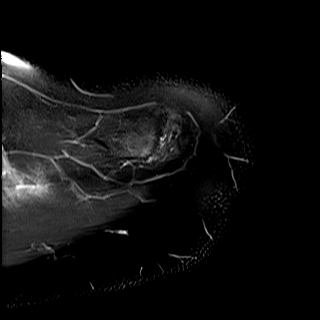
[im 26/26]
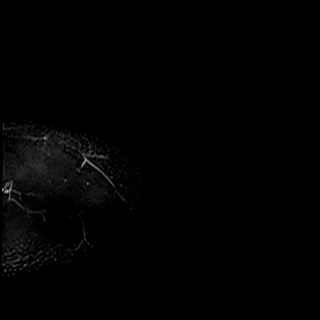

[Series 6: PD fat-sat · oblique · left · 4.0mm · 0.44mm/px · 8 of 26 slices shown]
[im 1/26]
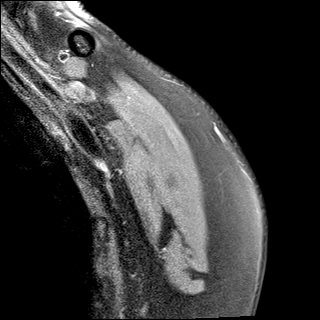
[im 4/26]
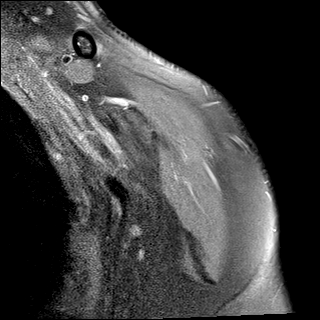
[im 8/26]
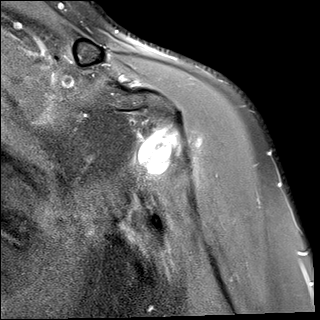
[im 11/26]
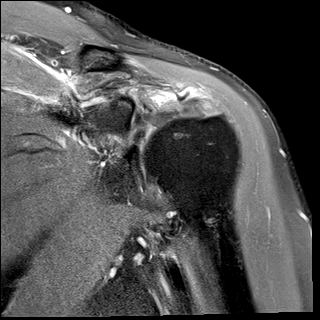
[im 15/26]
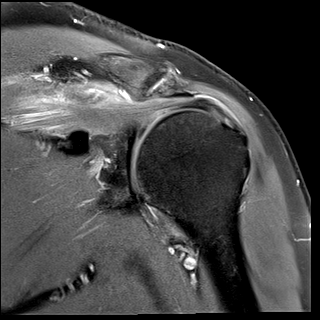
[im 18/26]
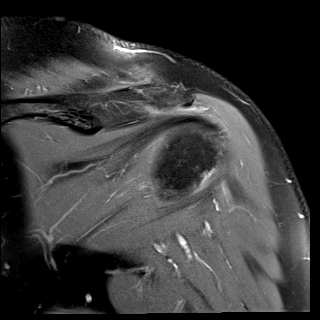
[im 22/26]
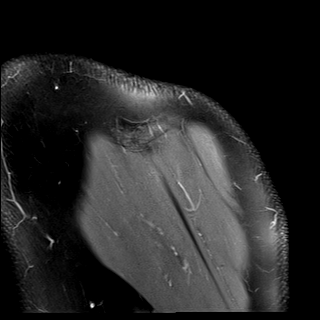
[im 26/26]
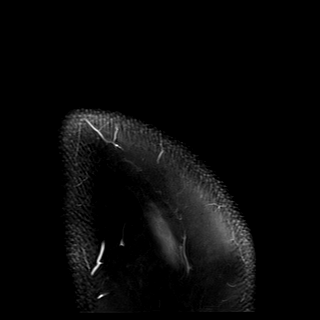

[Series 7: T2 fat-sat · oblique · left · 4.0mm · 0.44mm/px · 8 of 26 slices shown (2 of 3)]
[im 1/26]
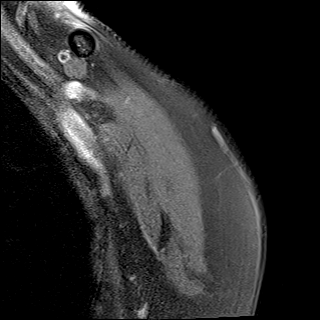
[im 4/26]
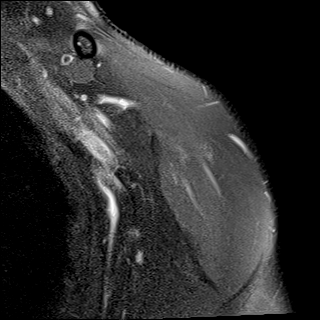
[im 8/26]
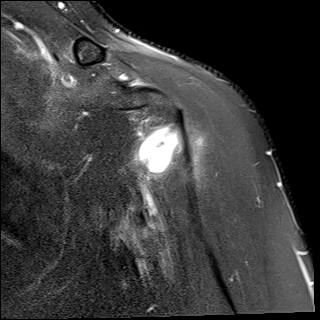
[im 11/26]
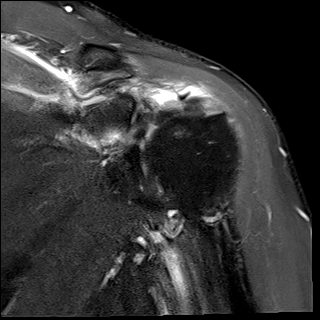
[im 15/26]
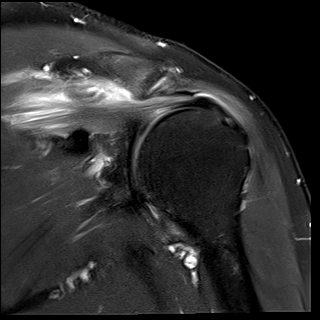
[im 18/26]
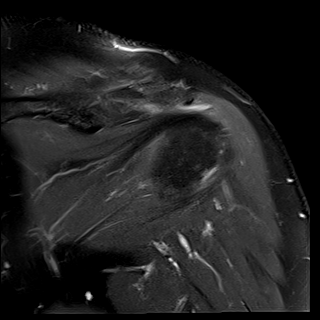
[im 22/26]
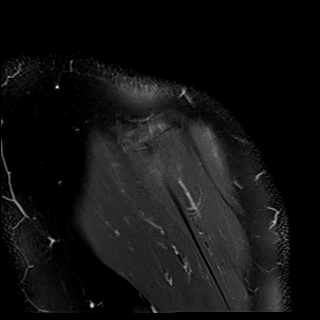
[im 26/26]
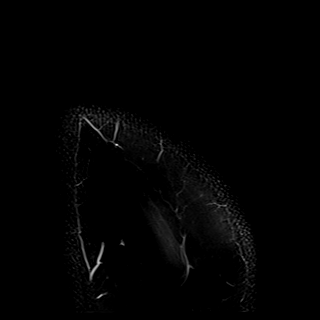

[Series 8: T2 fat-sat · oblique · left · 4.0mm · 0.23mm/px · 6 of 26 slices shown (3 of 3)]
[im 1/26]
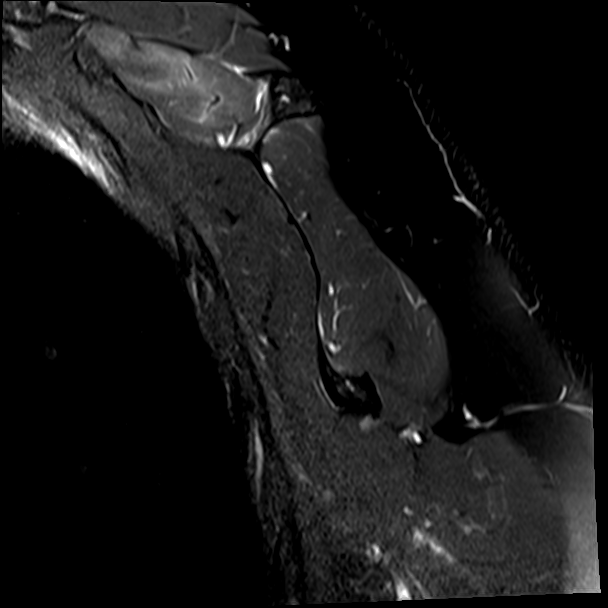
[im 4/26]
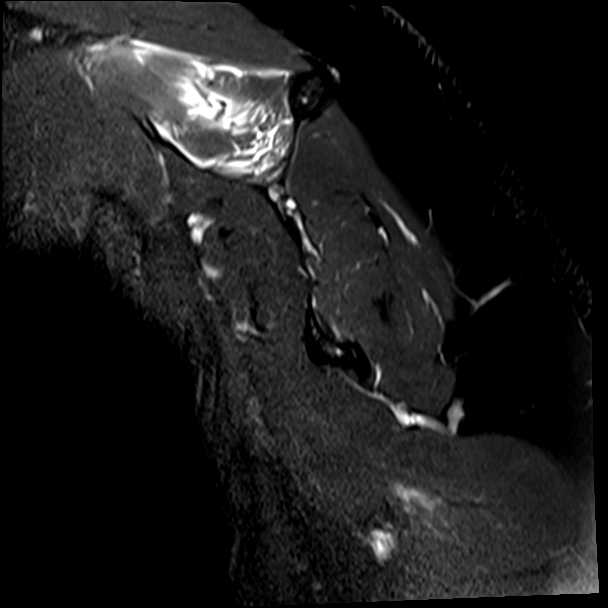
[im 8/26]
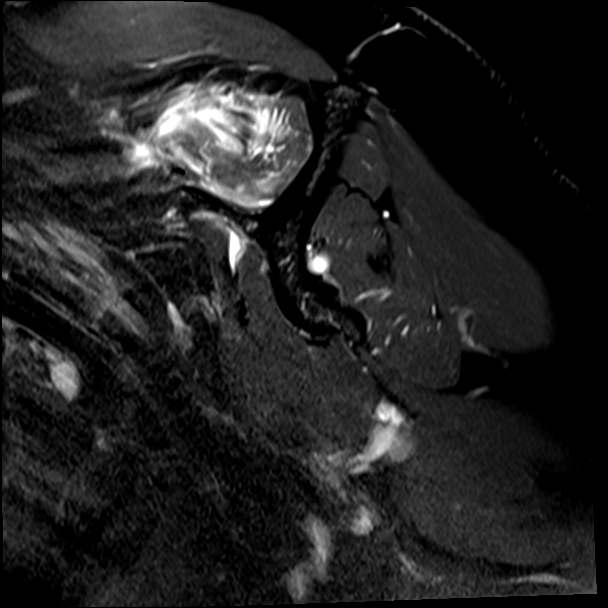
[im 11/26]
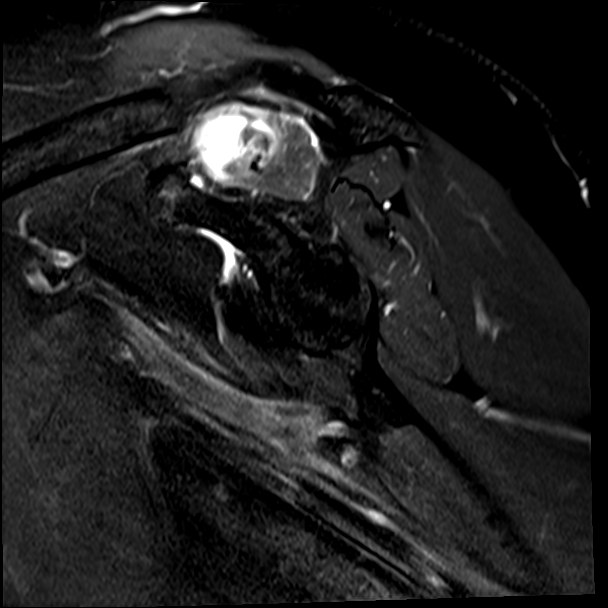
[im 15/26]
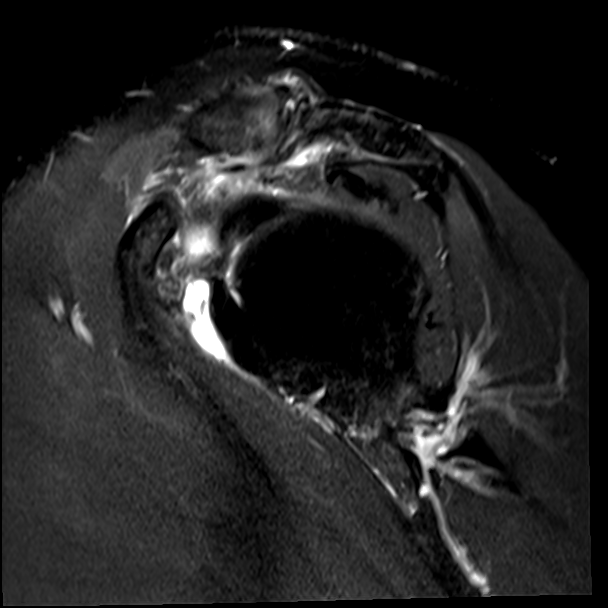
[im 22/26]
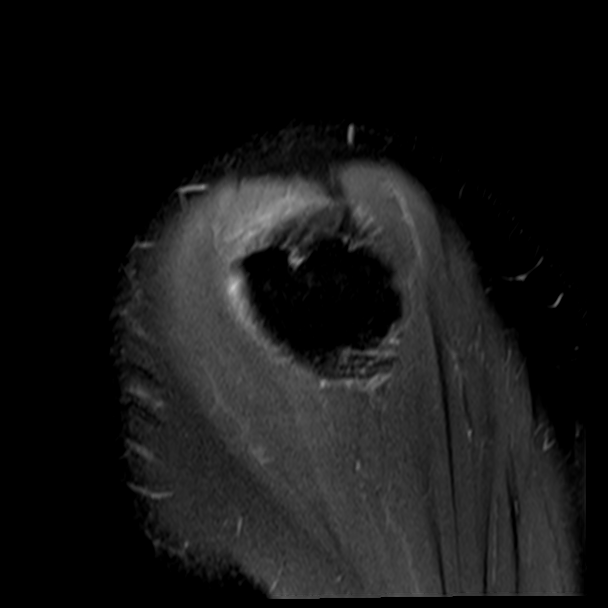

[30 of 40 positions shown; findings below may reference images not displayed]

FINDINGS: Rotator cuff: Significant tendinopathy the and partial-thickness
bursal surface tears along the musculotendinous junction region of
the supraspinatus tendon. Associated extensive tearing of the
subscapularis muscle back into the muscle belly. There is also fluid
surrounding the muscle and a focal fluid collection which could be a
liquified hematoma or ganglion cyst. The infraspinatus and
subscapularis tendons are intact.

Muscles: Significant supraspinatus muscle tear as above. The other
shoulder muscles are intact.

Biceps long head:  Intact

Acromioclavicular Joint: Moderate degenerative changes. Type 2
acromion. No significant lateral downsloping or subacromial
spurring.

Glenohumeral Joint: Mild degenerative changes. Small joint effusion
and mild synovitis.

Labrum:  No definite labral tears.

Bones:  No acute bony findings.

Other: Moderate subacromial/subdeltoid bursitis.
IMPRESSION: 1. Significant tendinopathy with interstitial tears and
partial-thickness bursal surface tears along the musculotendinous
junction region of the supraspinatus tendon.
2. Associated extensive tearing of the subscapularis muscle back
into the muscle belly. There is also fluid surrounding the muscle
and a focal fluid collection which could be a liquified hematoma or
ganglion cyst.
3. Intact long head biceps tendon and glenoid labrum.
4. Moderate AC joint degenerative changes but no other significant
findings for bony impingement.
5. Moderate subacromial/subdeltoid bursitis.

## 2021-12-02 ENCOUNTER — Encounter (INDEPENDENT_AMBULATORY_CARE_PROVIDER_SITE_OTHER): Payer: Self-pay

## 2022-06-10 ENCOUNTER — Ambulatory Visit (INDEPENDENT_AMBULATORY_CARE_PROVIDER_SITE_OTHER): Payer: Medicare Other

## 2022-06-10 ENCOUNTER — Ambulatory Visit (INDEPENDENT_AMBULATORY_CARE_PROVIDER_SITE_OTHER): Payer: Medicare Other | Admitting: Vascular Surgery

## 2022-06-10 VITALS — BP 124/82 | HR 66 | Resp 16 | Ht 66.0 in | Wt 149.0 lb

## 2022-06-10 DIAGNOSIS — E782 Mixed hyperlipidemia: Secondary | ICD-10-CM | POA: Diagnosis not present

## 2022-06-10 DIAGNOSIS — I6521 Occlusion and stenosis of right carotid artery: Secondary | ICD-10-CM

## 2022-06-10 DIAGNOSIS — I10 Essential (primary) hypertension: Secondary | ICD-10-CM

## 2022-06-10 DIAGNOSIS — I6930 Unspecified sequelae of cerebral infarction: Secondary | ICD-10-CM | POA: Diagnosis not present

## 2022-06-10 NOTE — Progress Notes (Signed)
MRN : [id number]  Russell Herman is a 77 y.o. (03-07-1945) male who presents with chief complaint of  Chief Complaint  Patient presents with   Follow-up  .  History of Present Illness: Patient returns in follow-up of his carotid disease.  About 3 years ago, he underwent right carotid endarterectomy for high-grade symptomatic stenosis.  He is doing well.  No recent focal neurologic symptoms. Carotid duplex today reveals his right carotid endarterectomy to be patent.  Left carotid velocities in the 1 to 39% range.   Current Outpatient Medications  Medication Sig Dispense Refill   aspirin EC 81 MG tablet Take by mouth.     chlorthalidone (HYGROTON) 25 MG tablet as needed.     Cholecalciferol (VITAMIN D) 50 MCG (2000 UT) tablet Take 2,000 Units by mouth daily.     clopidogrel (PLAVIX) 75 MG tablet Take 1 tablet (75 mg total) by mouth daily. 90 tablet 3   diphenhydrAMINE (BENADRYL) 25 MG tablet Take 25 mg by mouth daily as needed for itching.     docusate sodium (COLACE) 50 MG capsule Take by mouth.     finasteride (PROSCAR) 5 MG tablet Take by mouth.     pantoprazole (PROTONIX) 40 MG tablet Take 1 tablet (40 mg total) by mouth daily. 90 tablet 3   pregabalin (LYRICA) 100 MG capsule Take by mouth.     Psyllium (METAMUCIL PO) Take 1 Dose by mouth daily.     rosuvastatin (CRESTOR) 40 MG tablet Take 1 tablet (40 mg total) by mouth at bedtime. 90 tablet 3   No current facility-administered medications for this visit.    Past Medical History:  Diagnosis Date   Adenomatous colon polyp    Allergy    Arthritis    Cancer (HCC)    basal cell skin   Carotid stenosis    Coronary artery disease    GERD (gastroesophageal reflux disease)    HLD (hyperlipidemia)    Hypertension    Pre-diabetes    Stroke [facility])    Wears hearing aid in both ears     Past Surgical History:  Procedure Laterality Date   cyst removed     ENDARTERECTOMY Right 08/04/2019   Procedure: ENDARTERECTOMY CAROTID;   Surgeon: Annice Needy, MD;  Location: [facility] OR[facility];  Service: Vascular;  Laterality: Right;   EXCISION OF TONGUE LESION Bilateral 05/05/2019   Procedure: EXCISION OF DORSAL TONGUE LESION;  Surgeon: Vernie Murders, MD;  Location: [facility];  Service: ENT;  Laterality: Bilateral;   SHOULDER ARTHROSCOPY WITH ROTATOR CUFF REPAIR AND SUBACROMIAL DECOMPRESSION Left 01/09/2020   Procedure: Left shoulder arthroscopic  rotator cuff repair with Regeneten patch, subacromial decompression, and biceps tenodesis;  Surgeon: Signa Kell, MD;  Location: [facility] OR[facility];  Service: Orthopedics;  Laterality: Left;   skin cancer removed       Social History   Tobacco Use   Smoking status: Never   Smokeless tobacco: Never   Tobacco comments:    smoked "some" as teenager  Vaping Use   Vaping Use: Never used  Substance Use Topics   Alcohol use: Not Currently    Alcohol/week: 0.0 standard drinks of alcohol   Drug use: Not Currently       Family History  Problem Relation Age of Onset   Breast cancer Mother    Melanoma Father        mets to lung   Breast cancer Sister    Kidney failure Brother  s/p transplant     Allergies  Allergen Reactions   Losartan Rash   Chlorthalidone Nausea And Vomiting    Can tolerate if taking with protonix    Cephalexin     "minor reaction" Other reaction(s): Unknown "minor reaction"   Statins Rash    Myalgias, muscle pain/weakness   Sulfa Antibiotics Rash     REVIEW OF SYSTEMS (Negative unless checked)  Constitutional: [] Weight loss  [] Fever  [] Chills Cardiac: [] Chest pain   [] Chest pressure   [] Palpitations   [] Shortness of breath when laying flat   [] Shortness of breath at rest   [] Shortness of breath with exertion. Vascular:  [] Pain in legs with walking   [] Pain in legs at rest   [] Pain in legs when laying flat   [] Claudication   [] Pain in feet when walking  [] Pain in feet at rest  [] Pain in feet when laying flat   [] History of DVT   [] Phlebitis    [] Swelling in legs   [] Varicose veins   [] Non-healing ulcers Pulmonary:   [] Uses home oxygen   [] Productive cough   [] Hemoptysis   [] Wheeze  [] COPD   [] Asthma Neurologic:  [] Dizziness  [] Blackouts   [] Seizures   [x] History of stroke   [] History of TIA  [] Aphasia   [] Temporary blindness   [] Dysphagia   [x] Weakness or numbness in arms   [x] Weakness or numbness in legs Musculoskeletal:  [x] Arthritis   [] Joint swelling   [x] Joint pain   [] Low back pain Hematologic:  [] Easy bruising  [] Easy bleeding   [] Hypercoagulable state   [] Anemic  [] Hepatitis Gastrointestinal:  [] Blood in stool   [] Vomiting blood  [] Gastroesophageal reflux/heartburn   [] Difficulty swallowing. Genitourinary:  [] Chronic kidney disease   [] Difficult urination  [] Frequent urination  [] Burning with urination   [] Blood in urine Skin:  [] Rashes   [] Ulcers   [] Wounds Psychological:  [] History of anxiety   []  History of major depression.  Physical Examination  Vitals:   06/10/22 1050  BP: 124/82  Pulse: 66  Resp: 16  Weight: 149 lb (67.6 kg)  Height: 5\' 6"  (1.676 m)   Body mass index is 24.05 kg/m. Gen:  WD/WN, NAD Head: [place]/AT, No temporalis wasting. Ear/Nose/Throat: Hearing grossly intact, nares w/o erythema or drainage, trachea midline Eyes: Conjunctiva clear. Sclera non-icteric Neck: Supple.  No bruit  Pulmonary:  Good air movement, equal and clear to auscultation bilaterally.  Cardiac: RRR, No JVD Vascular:  Vessel Right Left  Radial Palpable Palpable           Musculoskeletal: M/S 5/5 throughout.  No deformity or atrophy. No edema. Neurologic: CN 2-12 intact. Sensation grossly intact in extremities.  Mild left sided weakness. Speech is fluent.  Psychiatric: Judgment intact, Mood & affect appropriate for pt's clinical situation. Dermatologic: No rashes or ulcers noted.  No cellulitis or open wounds. Lymph : No Cervical, Axillary, or Inguinal lymphadenopathy.    CBC Lab Results  Component Value Date   WBC 8.5  01/05/2020   HGB 14.2 01/05/2020   HCT 41.0 01/05/2020   MCV 85.8 01/05/2020   PLT 323 01/05/2020    BMET    Component Value Date/Time   NA 135 08/05/2019 0500   K 4.2 08/05/2019 0500   CL 103 08/05/2019 0500   CO2 19 (L) 08/05/2019 0500   GLUCOSE 150 (H) 08/05/2019 0500   BUN 19 08/05/2019 0500   CREATININE 0.94 08/05/2019 0500   CALCIUM 8.8 (L) 08/05/2019 0500   GFRNONAA >60 08/05/2019 0500   GFRAA >60 08/05/2019  0500   CrCl cannot be calculated (Patient's most recent lab result is older than the maximum 21 days allowed.).  COAG Lab Results  Component Value Date   INR 1.1 08/03/2019   INR 1.1 07/05/2019    Radiology No results found.   Assessment/Plan Essential hypertension blood pressure control important in reducing the progression of atherosclerotic disease. On appropriate oral medications.     Stroke [facility]) Had a stroke prior to treatment of his carotid disease.   Hyperlipidemia, mixed lipid control important in reducing the progression of atherosclerotic disease. Continue statin therapy   Carotid stenosis Carotid duplex today reveals his right carotid endarterectomy to be patent.  Left carotid velocities in the 1 to 39% range.  Continue to follow annually.  Continue current medical regimen.   Festus Barren, MD  06/10/2022 11:57 AM    This note was created with Dragon medical transcription system.  Any errors from dictation are purely unintentional

## 2022-06-10 NOTE — Assessment & Plan Note (Deleted)
Carotid duplex today reveals his right carotid endarterectomy to be patent.  Left carotid velocities in the 1 to 39% range.

## 2022-10-09 ENCOUNTER — Encounter: Payer: Self-pay | Admitting: Gastroenterology

## 2022-10-09 ENCOUNTER — Ambulatory Visit
Admission: RE | Admit: 2022-10-09 | Discharge: 2022-10-09 | Disposition: A | Discharge status: Home / Self Care | Payer: Medicare Other | Attending: Gastroenterology | Admitting: Gastroenterology

## 2022-10-09 ENCOUNTER — Ambulatory Visit: Payer: Medicare Other | Admitting: Anesthesiology

## 2022-10-09 ENCOUNTER — Encounter
Admission: RE | Disposition: A | Discharge status: Home / Self Care | Payer: Self-pay | Source: Home / Self Care | Attending: Gastroenterology

## 2022-10-09 DIAGNOSIS — K5909 Other constipation: Secondary | ICD-10-CM | POA: Insufficient documentation

## 2022-10-09 DIAGNOSIS — Z1211 Encounter for screening for malignant neoplasm of colon: Secondary | ICD-10-CM | POA: Diagnosis present

## 2022-10-09 DIAGNOSIS — K640 First degree hemorrhoids: Secondary | ICD-10-CM | POA: Insufficient documentation

## 2022-10-09 DIAGNOSIS — Z8673 Personal history of transient ischemic attack (TIA), and cerebral infarction without residual deficits: Secondary | ICD-10-CM | POA: Insufficient documentation

## 2022-10-09 DIAGNOSIS — K219 Gastro-esophageal reflux disease without esophagitis: Secondary | ICD-10-CM | POA: Insufficient documentation

## 2022-10-09 DIAGNOSIS — K635 Polyp of colon: Secondary | ICD-10-CM | POA: Diagnosis not present

## 2022-10-09 DIAGNOSIS — I2510 Atherosclerotic heart disease of native coronary artery without angina pectoris: Secondary | ICD-10-CM | POA: Diagnosis not present

## 2022-10-09 DIAGNOSIS — I10 Essential (primary) hypertension: Secondary | ICD-10-CM | POA: Diagnosis not present

## 2022-10-09 DIAGNOSIS — Z7902 Long term (current) use of antithrombotics/antiplatelets: Secondary | ICD-10-CM | POA: Insufficient documentation

## 2022-10-09 DIAGNOSIS — K5730 Diverticulosis of large intestine without perforation or abscess without bleeding: Secondary | ICD-10-CM | POA: Insufficient documentation

## 2022-10-09 HISTORY — PX: COLONOSCOPY WITH PROPOFOL: SHX5780

## 2022-10-09 HISTORY — PX: POLYPECTOMY: SHX5525

## 2022-10-09 SURGERY — COLONOSCOPY WITH PROPOFOL
Anesthesia: General

## 2022-10-09 MED ORDER — PROPOFOL 10 MG/ML IV BOLUS
INTRAVENOUS | Status: DC | PRN
  Administered 2022-10-09: 140 ug/kg/min via INTRAVENOUS
  Administered 2022-10-09: 80 mg via INTRAVENOUS

## 2022-10-09 MED ORDER — PHENYLEPHRINE 80 MCG/ML (10ML) SYRINGE FOR IV PUSH (FOR BLOOD PRESSURE SUPPORT)
PREFILLED_SYRINGE | INTRAVENOUS | Status: AC
  Filled 2022-10-09: qty 10

## 2022-10-09 MED ORDER — PHENYLEPHRINE HCL (PRESSORS) 10 MG/ML IV SOLN
INTRAVENOUS | Status: DC | PRN
  Administered 2022-10-09 (×2): 80 ug via INTRAVENOUS

## 2022-10-09 MED ORDER — LIDOCAINE HCL (CARDIAC) PF 100 MG/5ML IV SOSY
PREFILLED_SYRINGE | INTRAVENOUS | Status: DC | PRN
  Administered 2022-10-09: 100 mg via INTRAVENOUS

## 2022-10-09 MED ORDER — LIDOCAINE HCL (PF) 2 % IJ SOLN
INTRAMUSCULAR | Status: AC
  Filled 2022-10-09: qty 5

## 2022-10-09 MED ORDER — SODIUM CHLORIDE 0.9 % IV SOLN
INTRAVENOUS | Status: DC
  Administered 2022-10-09: 1000 mL via INTRAVENOUS

## 2022-10-09 MED ORDER — PROPOFOL 10 MG/ML IV BOLUS
INTRAVENOUS | Status: AC
  Filled 2022-10-09: qty 40

## 2022-10-09 NOTE — Op Note (Signed)
[facility] Patient Name: Russell Herman Procedure Date: 10/09/2022 10:39 AM MRN: [id number] Account #: [id number] Date of Birth: [birth year] Admit Type: Outpatient Age: 77 Room: [facility] ENDO ROOM 1 Gender: Male Note Status: Finalized Instrument Name: Colonoscope [id number] Procedure:             Colonoscopy Indications:           High risk colon cancer surveillance: Personal history                         of colonic polyps Providers:             Trenda Moots, DO Referring MD:          Jaynie Collins DO, DO (Referring MD), Nat Christen.                         Zada Finders, MD (Referring MD) Medicines:             Monitored Anesthesia Care Complications:         No immediate complications. Estimated blood loss:                         Minimal. Procedure:             Pre-Anesthesia Assessment:                        - Prior to the procedure, a History and Physical was                         performed, and patient medications and allergies were                         reviewed. The patient is competent. The risks and                         benefits of the procedure and the sedation options and                         risks were discussed with the patient. All questions                         were answered and informed consent was obtained.                         Patient identification and proposed procedure were                         verified by the physician, the nurse, the anesthetist                         and the technician in the endoscopy suite. Mental                         Status Examination: alert and oriented. Airway                         Examination: normal oropharyngeal airway and neck  mobility. Respiratory Examination: clear to                         auscultation. CV Examination: RRR, no murmurs, no S3                         or S4. Prophylactic Antibiotics: The patient does not                          require prophylactic antibiotics. Prior                         Anticoagulants: The patient has taken Plavix                         (clopidogrel), last dose was 5 days prior to                         procedure. ASA Grade Assessment: III - A patient with                         severe systemic disease. After reviewing the risks and                         benefits, the patient was deemed in satisfactory                         condition to undergo the procedure. The anesthesia                         plan was to use monitored anesthesia care (MAC).                         Immediately prior to administration of medications,                         the patient was re-assessed for adequacy to receive                         sedatives. The heart rate, respiratory rate, oxygen                         saturations, blood pressure, adequacy of pulmonary                         ventilation, and response to care were monitored                         throughout the procedure. The physical status of the                         patient was re-assessed after the procedure.                        After obtaining informed consent, the colonoscope was                         passed under direct vision. Throughout the procedure,  the patient's blood pressure, pulse, and oxygen                         saturations were monitored continuously. The                         Colonoscope was introduced through the anus and                         advanced to the the terminal ileum, with                         identification of the appendiceal orifice and IC                         valve. The colonoscopy was performed without                         difficulty. The patient tolerated the procedure well.                         The quality of the bowel preparation was evaluated                         using the BBPS [facility] Bowel Preparation Scale) with                         scores of: Right  Colon = 3 (entire mucosa seen well                         with no residual staining, small fragments of stool or                         opaque liquid), Transverse Colon = 3 (entire mucosa                         seen well with no residual staining, small fragments                         of stool or opaque liquid) and Left Colon = 2 (minor                         amount of residual staining, small fragments of stool                         and/or opaque liquid, but mucosa seen well). The total                         BBPS score equals 8. The quality of the bowel                         preparation was excellent. The terminal ileum,                         ileocecal valve, appendiceal orifice, and rectum were  photographed. Findings:      The terminal ileum appeared normal. Estimated blood loss: none.      A 1 mm polyp was found in the sigmoid colon. The polyp was sessile. The       polyp was removed with a jumbo cold forceps. Resection and retrieval       were complete. Estimated blood loss was minimal.      Multiple small-mouthed diverticula were found in the entire colon.       Estimated blood loss: none.      Non-bleeding internal hemorrhoids were found during retroflexion. The       hemorrhoids were Grade I (internal hemorrhoids that do not prolapse).       Estimated blood loss: none.      The exam was otherwise without abnormality on direct and retroflexion       views. Impression:            - The examined portion of the ileum was normal.                        - One 1 mm polyp in the sigmoid colon, removed with a                         jumbo cold forceps. Resected and retrieved.                        - Diverticulosis in the entire examined colon.                        - Non-bleeding internal hemorrhoids.                        - The examination was otherwise normal on direct and                         retroflexion views. Recommendation:        -  Patient has a contact number available for                         emergencies. The signs and symptoms of potential                         delayed complications were discussed with the patient.                         Return to normal activities tomorrow. Written                         discharge instructions were provided to the patient.                        - Discharge patient to home.                        - Resume previous diet.                        - Continue present medications.                        - Await pathology  results.                        - Repeat colonoscopy for surveillance based on                         pathology results.                        - Return to referring physician as previously                         scheduled.                        - The findings and recommendations were discussed with                         the patient. Procedure Code(s):     --- Professional ---                        [phone], Colonoscopy, flexible; with biopsy, single or                         multiple Diagnosis Code(s):     --- Professional ---                        Z86.010, Personal history of colonic polyps                        K64.0, First degree hemorrhoids                        D12.5, Benign neoplasm of sigmoid colon                        K57.30, Diverticulosis of large intestine without                         perforation or abscess without bleeding CPT copyright 2022 American Medical Association. All rights reserved. The codes documented in this report are preliminary and upon coder review may  be revised to meet current compliance requirements. Attending Participation:      I personally performed the entire procedure. Elfredia Nevins, DO Jaynie Collins DO, DO 10/09/2022 11:44:47 AM This report has been signed electronically. Number of Addenda: 0 Note Initiated On: 10/09/2022 10:39 AM Scope Withdrawal Time: 0 hours 14 minutes 41 seconds  Total Procedure  Duration: 0 hours 18 minutes 39 seconds  Estimated Blood Loss:  Estimated blood loss was minimal.      [facility]

## 2022-10-09 NOTE — Anesthesia Preprocedure Evaluation (Signed)
Anesthesia Evaluation  Patient identified by MRN, date of birth, ID band Patient awake    Reviewed: Allergy & Precautions, NPO status , Patient's Chart, lab work & pertinent test results  History of Anesthesia Complications Negative for: history of anesthetic complications  Airway Mallampati: III  TM Distance: <3 FB Neck ROM: full    Dental  (+) Chipped, Poor Dentition, Missing   Pulmonary neg pulmonary ROS, neg shortness of breath   Pulmonary exam normal        Cardiovascular Exercise Tolerance: Good hypertension, + CAD  Normal cardiovascular exam     Neuro/Psych TIACVA  negative psych ROS   GI/Hepatic Neg liver ROS,GERD  Controlled,,  Endo/Other  negative endocrine ROS    Renal/GU Renal disease  negative genitourinary   Musculoskeletal   Abdominal   Peds  Hematology negative hematology ROS (+)   Anesthesia Other Findings Past Medical History: No date: Adenomatous colon polyp No date: Allergy No date: Arthritis No date: Cancer (HCC)     Comment:  basal cell skin No date: Carotid stenosis No date: Coronary artery disease No date: GERD (gastroesophageal reflux disease) No date: HLD (hyperlipidemia) No date: Hypertension No date: Pre-diabetes No date: Stroke [facility]) No date: Wears hearing aid in both ears  Past Surgical History: No date: cyst removed 08/04/2019: ENDARTERECTOMY; Right     Comment:  Procedure: ENDARTERECTOMY CAROTID;  Surgeon: Annice Needy, MD;  Location: [facility] OR[facility];  Service: Vascular;                Laterality: Right; 05/05/2019: EXCISION OF TONGUE LESION; Bilateral     Comment:  Procedure: EXCISION OF DORSAL TONGUE LESION;  Surgeon:               Vernie Murders, MD;  Location: [facility];                Service: ENT;  Laterality: Bilateral; 01/09/2020: SHOULDER ARTHROSCOPY WITH ROTATOR CUFF REPAIR AND  SUBACROMIAL DECOMPRESSION; Left     Comment:  Procedure: Left  shoulder arthroscopic  rotator cuff               repair with Regeneten patch, subacromial decompression,               and biceps tenodesis;  Surgeon: Signa Kell, MD;                Location: [facility] OR[facility];  Service: Orthopedics;  Laterality:               Left; No date: SHOULDER SURGERY; Left No date: skin cancer removed  BMI    Body Mass Index: 24.58 kg/m      Reproductive/Obstetrics negative OB ROS                             Anesthesia Physical Anesthesia Plan  ASA: 3  Anesthesia Plan: General   Post-op Pain Management:    Induction: Intravenous  PONV Risk Score and Plan: Propofol infusion and TIVA  Airway Management Planned: Natural Airway and Nasal Cannula  Additional Equipment:   Intra-op Plan:   Post-operative Plan:   Informed Consent: I have reviewed the patients History and Physical, chart, labs and discussed the procedure including the risks, benefits and alternatives for the proposed anesthesia with the patient or authorized representative who has indicated his/her understanding and acceptance.  Dental Advisory Given  Plan Discussed with: Anesthesiologist, CRNA and Surgeon  Anesthesia Plan Comments: (Patient consented for risks of anesthesia including but not limited to:  - adverse reactions to medications - risk of airway placement if required - damage to eyes, teeth, lips or other oral mucosa - nerve damage due to positioning  - sore throat or hoarseness - Damage to heart, brain, nerves, lungs, other parts of body or loss of life  Patient voiced understanding.)       Anesthesia Quick Evaluation

## 2022-10-09 NOTE — Interval H&P Note (Signed)
History and Physical Interval Note: Preprocedure H&P from 10/09/22  was reviewed and there was no interval change after seeing and examining the patient.  Written consent was obtained from the patient after discussion of risks, benefits, and alternatives. Patient has consented to proceed with Colonoscopy with possible intervention   10/09/2022 10:51 AM  [name]  has presented today for surgery, with the diagnosis of V12.72 (ICD-9-CM) - Z86.010 (ICD-10-CM) - Personal history of colonic polyps.  The various methods of treatment have been discussed with the patient and family. After consideration of risks, benefits and other options for treatment, the patient has consented to  Procedure(s): COLONOSCOPY WITH PROPOFOL (N/A) as a surgical intervention.  The patient's history has been reviewed, patient examined, no change in status, stable for surgery.  I have reviewed the patient's chart and labs.  Questions were answered to the patient's satisfaction.     Russell Herman

## 2022-10-09 NOTE — Transfer of Care (Signed)
Immediate Anesthesia Transfer of Care Note  Patient: Russell Herman  Procedure(s) Performed: COLONOSCOPY WITH PROPOFOL POLYPECTOMY  Patient Location: Endoscopy Unit  Anesthesia Type:General  Level of Consciousness: drowsy  Airway & Oxygen Therapy: Patient Spontanous Breathing  Post-op Assessment: Report given to RN and Post -op Vital signs reviewed and stable  Post vital signs: Reviewed and stable  Last Vitals:  Vitals Value Taken Time  BP 102/57 10/09/22 1143  Temp 36.4 C 10/09/22 1142  Pulse 60 10/09/22 1143  Resp 15 10/09/22 1143  SpO2 98 % 10/09/22 1143  Vitals shown include unfiled device data.  Last Pain:  Vitals:   10/09/22 1142  TempSrc: Temporal  PainSc: Asleep         Complications: No notable events documented.

## 2022-10-09 NOTE — Anesthesia Postprocedure Evaluation (Signed)
Anesthesia Post Note  Patient: Russell Herman  Procedure(s) Performed: COLONOSCOPY WITH PROPOFOL POLYPECTOMY  Patient location during evaluation: Endoscopy Anesthesia Type: General Level of consciousness: awake and alert Pain management: pain level controlled Vital Signs Assessment: post-procedure vital signs reviewed and stable Respiratory status: spontaneous breathing, nonlabored ventilation, respiratory function stable and patient connected to nasal cannula oxygen Cardiovascular status: blood pressure returned to baseline and stable Postop Assessment: no apparent nausea or vomiting Anesthetic complications: no   No notable events documented.   Last Vitals:  Vitals:   10/09/22 1212 10/09/22 1222  BP: 123/64 132/65  Pulse: 62 (!) 59  Resp: 18 14  Temp:    SpO2: 100% 100%    Last Pain:  Vitals:   10/09/22 1222  TempSrc:   PainSc: 0-No pain                 Cleda Mccreedy Franky Reier

## 2022-10-09 NOTE — H&P (Signed)
Pre-Procedure H&P   [name] is a 77 y.o. male.  Gastroenterology Provider: Jaynie Collins, DO  Referring Provider: Tawni Pummel, PA PCP: Dione Housekeeper, MD  Date: 10/09/2022  HPI [name] is a 77 y.o. male who presents today for Colonoscopy for surveillance-personal history of colon polyps.  Deals with chronic constipation.  No melena or hematochezia.  Left-sided diverticulosis on previous scopes  Last underwent EGD and colonoscopy in 2018 demonstrating 4 adenomatous polyps.  He had adenomatous polyps in 2015 as well  Plavix has been held for the procedure (last dose August 28th) Cleared by his cardiologist for procedure today No family history of colon cancer or colon polyps   Past Medical History:  Diagnosis Date   Adenomatous colon polyp    Allergy    Arthritis    Cancer (HCC)    basal cell skin   Carotid stenosis    Coronary artery disease    GERD (gastroesophageal reflux disease)    HLD (hyperlipidemia)    Hypertension    Pre-diabetes    Stroke [facility])    Wears hearing aid in both ears     Past Surgical History:  Procedure Laterality Date   cyst removed     ENDARTERECTOMY Right 08/04/2019   Procedure: ENDARTERECTOMY CAROTID;  Surgeon: Annice Needy, MD;  Location: [facility] ORS;  Service: Vascular;  Laterality: Right;   EXCISION OF TONGUE LESION Bilateral 05/05/2019   Procedure: EXCISION OF DORSAL TONGUE LESION;  Surgeon: Vernie Murders, MD;  Location: [facility];  Service: ENT;  Laterality: Bilateral;   SHOULDER ARTHROSCOPY WITH ROTATOR CUFF REPAIR AND SUBACROMIAL DECOMPRESSION Left 01/09/2020   Procedure: Left shoulder arthroscopic  rotator cuff repair with Regeneten patch, subacromial decompression, and biceps tenodesis;  Surgeon: Signa Kell, MD;  Location: [facility] ORS;  Service: Orthopedics;  Laterality: Left;   SHOULDER SURGERY Left    skin cancer removed      Family History No h/o GI disease or  malignancy  Review of Systems  Constitutional:  Negative for activity change, appetite change, chills, diaphoresis, fatigue, fever and unexpected weight change.  HENT:  Negative for trouble swallowing and voice change.   Respiratory:  Negative for shortness of breath and wheezing.   Cardiovascular:  Negative for chest pain, palpitations and leg swelling.  Gastrointestinal:  Positive for constipation. Negative for abdominal distention, abdominal pain, anal bleeding, blood in stool, diarrhea, nausea and vomiting.  Musculoskeletal:  Negative for arthralgias and myalgias.  Skin:  Negative for color change and pallor.  Neurological:  Negative for dizziness, syncope and weakness.  Psychiatric/Behavioral:  Negative for confusion. The patient is not nervous/anxious.   All other systems reviewed and are negative.    Medications No current facility-administered medications on file prior to encounter.   Current Outpatient Medications on File Prior to Encounter  Medication Sig Dispense Refill   aspirin EC 81 MG tablet Take by mouth.     Cholecalciferol (VITAMIN D) 50 MCG (2000 UT) tablet Take 2,000 Units by mouth daily.     clopidogrel (PLAVIX) 75 MG tablet Take 1 tablet (75 mg total) by mouth daily. 90 tablet 3   diphenhydrAMINE (BENADRYL) 25 MG tablet Take 25 mg by mouth daily as needed for itching.     docusate sodium (COLACE) 50 MG capsule Take by mouth.     finasteride (PROSCAR) 5 MG tablet Take by mouth.     pantoprazole (PROTONIX) 40 MG tablet Take 1 tablet (40 mg total)  by mouth daily. 90 tablet 3   pregabalin (LYRICA) 100 MG capsule Take by mouth.     Psyllium (METAMUCIL PO) Take 1 Dose by mouth daily.     rosuvastatin (CRESTOR) 40 MG tablet Take 1 tablet (40 mg total) by mouth at bedtime. 90 tablet 3   chlorthalidone (HYGROTON) 25 MG tablet as needed.      Pertinent medications related to GI and procedure were reviewed by me with the patient prior to the procedure   Current  Facility-Administered Medications:    0.9 %  sodium chloride infusion, , Intravenous, Continuous, Jaynie Collins, DO  sodium chloride         Allergies  Allergen Reactions   Losartan Rash   Chlorthalidone Nausea And Vomiting    Can tolerate if taking with protonix    Cephalexin     "minor reaction" Other reaction(s): Unknown "minor reaction"   Statins Rash    Myalgias, muscle pain/weakness   Sulfa Antibiotics Rash   Allergies were reviewed by me prior to the procedure  Objective   Body mass index is 24.58 kg/m. Vitals:   10/09/22 1049  BP: 111/67  Pulse: 76  Resp: 18  Temp: (!) 97.1 F (36.2 C)  TempSrc: Temporal  SpO2: 98%  Weight: 69.1 kg  Height: 5\' 6"  (1.676 m)     Physical Exam Vitals and nursing note reviewed.  Constitutional:      General: He is not in acute distress.    Appearance: Normal appearance. He is not ill-appearing, toxic-appearing or diaphoretic.  HENT:     Head: Normocephalic and atraumatic.     Nose: Nose normal.     Mouth/Throat:     Mouth: Mucous membranes are moist.     Pharynx: Oropharynx is clear.  Eyes:     General: No scleral icterus.    Extraocular Movements: Extraocular movements intact.  Cardiovascular:     Rate and Rhythm: Normal rate and regular rhythm.     Heart sounds: Normal heart sounds. No murmur heard.    No friction rub. No gallop.  Pulmonary:     Effort: Pulmonary effort is normal. No respiratory distress.     Breath sounds: Normal breath sounds. No wheezing, rhonchi or rales.  Abdominal:     General: Bowel sounds are normal. There is no distension.     Palpations: Abdomen is soft.     Tenderness: There is no abdominal tenderness. There is no guarding or rebound.  Musculoskeletal:     Cervical back: Neck supple.     Right lower leg: No edema.     Left lower leg: No edema.  Skin:    General: Skin is warm and dry.     Coloration: Skin is not jaundiced or pale.  Neurological:     General: No focal  deficit present.     Mental Status: He is alert and oriented to person, place, and time. Mental status is at baseline.  Psychiatric:        Mood and Affect: Mood normal.        Behavior: Behavior normal.        Thought Content: Thought content normal.        Judgment: Judgment normal.      Assessment:  [name] is a 77 y.o. male  who presents today for Colonoscopy for surveillance-personal history of colon polyps.  Plan:  Colonoscopy with possible intervention today  Colonoscopy with possible biopsy, control of bleeding, polypectomy, and interventions as  necessary has been discussed with the patient/patient representative. Informed consent was obtained from the patient/patient representative after explaining the indication, nature, and risks of the procedure including but not limited to death, bleeding, perforation, missed neoplasm/lesions, cardiorespiratory compromise, and reaction to medications. Opportunity for questions was given and appropriate answers were provided. Patient/patient representative has verbalized understanding is amenable to undergoing the procedure.   Jaynie Collins, DO  [facility]  Portions of the record may have been created with voice recognition software. Occasional wrong-word or 'sound-a-like' substitutions may have occurred due to the inherent limitations of voice recognition software.  Read the chart carefully and recognize, using context, where substitutions may have occurred.

## 2022-10-10 ENCOUNTER — Encounter: Payer: Self-pay | Admitting: Gastroenterology

## 2022-10-13 NOTE — Group Note (Deleted)

## 2023-06-04 ENCOUNTER — Other Ambulatory Visit (INDEPENDENT_AMBULATORY_CARE_PROVIDER_SITE_OTHER): Payer: Self-pay | Admitting: Vascular Surgery

## 2023-06-04 DIAGNOSIS — I6521 Occlusion and stenosis of right carotid artery: Secondary | ICD-10-CM

## 2023-06-11 ENCOUNTER — Ambulatory Visit (INDEPENDENT_AMBULATORY_CARE_PROVIDER_SITE_OTHER): Payer: Medicare Other

## 2023-06-11 ENCOUNTER — Ambulatory Visit (INDEPENDENT_AMBULATORY_CARE_PROVIDER_SITE_OTHER): Payer: Medicare Other | Admitting: Nurse Practitioner

## 2023-06-11 VITALS — BP 111/69 | HR 78 | Resp 17 | Ht 66.0 in | Wt 156.0 lb

## 2023-06-11 DIAGNOSIS — I6521 Occlusion and stenosis of right carotid artery: Secondary | ICD-10-CM

## 2023-06-11 DIAGNOSIS — E782 Mixed hyperlipidemia: Secondary | ICD-10-CM | POA: Diagnosis not present

## 2023-06-11 DIAGNOSIS — I10 Essential (primary) hypertension: Secondary | ICD-10-CM | POA: Diagnosis not present

## 2023-06-14 ENCOUNTER — Encounter (INDEPENDENT_AMBULATORY_CARE_PROVIDER_SITE_OTHER): Payer: Self-pay | Admitting: Nurse Practitioner

## 2023-06-14 NOTE — Progress Notes (Signed)
 Subjective:    [name], male    DOB: 1945/10/28, 78 y.o.   MRN: [id number] Chief Complaint  Patient presents with   Venous Insufficiency    The patient is seen for follow up evaluation of carotid stenosis. The carotid stenosis followed by ultrasound.   The patient denies amaurosis fugax. There is no recent history of CVA with residual deficits.  The patient is taking enteric-coated aspirin  81 mg daily.  There is no history of migraine headaches. There is no history of seizures.  The patient has a history of coronary artery disease, no recent episodes of angina or shortness of breath. The patient denies PAD or claudication symptoms.  The patient does note having a feeling of cold feet.  He notes that the left feels worse than the right.  The patient also has residual deficits following a CVA and he notes that the left side was the most affected.  He notes that it is not consistent.  Other than the school failing he does not have any other symptoms of PAD such as wounds, ulcers, rest pain or claudication.  Carotid Duplex done today shows 1 to 39% in the bilateral internal carotid arteries.  The right endarterectomy site is patent with no evidence of restenosis.    Review of Systems  Skin:  Negative for color change.  All other systems reviewed and are negative.      Objective:   Physical Exam Vitals reviewed.  HENT:     Head: Normocephalic.  Cardiovascular:     Rate and Rhythm: Normal rate.     Pulses:          Dorsalis pedis pulses are 2+ on the right side and 2+ on the left side.       Posterior tibial pulses are 2+ on the right side and 2+ on the left side.  Pulmonary:     Effort: Pulmonary effort is normal.  Musculoskeletal:        General: Normal range of motion.  Skin:    General: Skin is warm and dry.  Neurological:     Mental Status: He is alert and oriented to person, place, and time.  Psychiatric:        Mood and Affect: Mood normal.         Behavior: Behavior normal.        Thought Content: Thought content normal.        Judgment: Judgment normal.     BP 111/69 (BP Location: Right Arm, Patient Position: Sitting, Cuff Size: Normal)   Pulse 78   Resp 17   Ht 5\' 6"  (1.676 m)   Wt 156 lb (70.8 kg)   BMI 25.18 kg/m   Past Medical History:  Diagnosis Date   Adenomatous colon polyp    Allergy    Arthritis    Cancer (HCC)    basal cell skin   Carotid stenosis    Coronary artery disease    GERD (gastroesophageal reflux disease)    HLD (hyperlipidemia)    Hypertension    Pre-diabetes    Stroke [facility])    Wears hearing aid in both ears     Social History   Socioeconomic History   Marital status: Married    Spouse name: Not on file   Number of children: Not on file   Years of education: Not on file   Highest education level: Not on file  Occupational History   Not on file  Tobacco Use  Smoking status: Never   Smokeless tobacco: Never   Tobacco comments:    smoked "some" as teenager  Vaping Use   Vaping status: Never Used  Substance and Sexual Activity   Alcohol use: Not Currently    Alcohol/week: 0.0 standard drinks of alcohol   Drug use: Not Currently   Sexual activity: Not on file  Other Topics Concern   Not on file  Social History Narrative   Not on file   Social Drivers of Health   Financial Resource Strain: Low Risk  (05/23/2023)   Received from [facility]   Overall Financial Resource Strain (CARDIA)    Difficulty of Paying Living Expenses: Not hard at all  Food Insecurity: No Food Insecurity (05/23/2023)   Received from [facility]   Hunger Vital Sign    Worried About Running Out of Food in the Last Year: Never true    Ran Out of Food in the Last Year: Never true  Transportation Needs: No Transportation Needs (05/23/2023)   Received from [facility] - Transportation    In the past 12 months, has lack of transportation kept you  from medical appointments or from getting medications?: No    Lack of Transportation (Non-Medical): No  Physical Activity: Not on file  Stress: Not on file  Social Connections: Not on file  Intimate Partner Violence: Not on file    Past Surgical History:  Procedure Laterality Date   COLONOSCOPY WITH PROPOFOL  N/A 10/09/2022   Procedure: COLONOSCOPY WITH PROPOFOL ;  Surgeon: Quintin Buckle, DO;  Location: [facility] END[facility]COPY;  Service: Gastroenterology;  Laterality: N/A;   cyst removed     ENDARTERECTOMY Right 08/04/2019   Procedure: ENDARTERECTOMY CAROTID;  Surgeon: Celso College, MD;  Location: [facility] ORS;  Service: Vascular;  Laterality: Right;   EXCISION OF TONGUE LESION Bilateral 05/05/2019   Procedure: EXCISION OF DORSAL TONGUE LESION;  Surgeon: Mellody Sprout, MD;  Location: [facility];  Service: ENT;  Laterality: Bilateral;   POLYPECTOMY  10/09/2022   Procedure: POLYPECTOMY;  Surgeon: Quintin Buckle, DO;  Location: [facility];  Service: Gastroenterology;;   SHOULDER ARTHROSCOPY WITH ROTATOR CUFF REPAIR AND SUBACROMIAL DECOMPRESSION Left 01/09/2020   Procedure: Left shoulder arthroscopic  rotator cuff repair with Regeneten patch, subacromial decompression, and biceps tenodesis;  Surgeon: Lorri Rota, MD;  Location: [facility] ORS;  Service: Orthopedics;  Laterality: Left;   SHOULDER SURGERY Left    skin cancer removed      Family History  Problem Relation Age of Onset   Breast cancer Mother    Melanoma Father        mets to lung   Breast cancer Sister    Kidney failure Brother        s/p transplant    Allergies  Allergen Reactions   Losartan Rash   Chlorthalidone Nausea And Vomiting    Can tolerate if taking with protonix     Cephalexin     "minor reaction" Other reaction(s): Unknown "minor reaction"   Statins Rash    Myalgias, muscle pain/weakness   Sulfa Antibiotics Rash       Latest Ref Rng & Units 01/05/2020    9:19 AM 08/05/2019    5:00 AM 08/03/2019    10:25 AM  CBC  WBC 4.0 - 10.5 K/uL 8.5  22.2  9.1   Hemoglobin 13.0 - 17.0 g/dL 16.1  09.6  04.5   Hematocrit 39.0 - 52.0 % 41.0  39.3  41.7   Platelets 150 - 400 K/uL 323  336  319       CMP     Component Value Date/Time   NA 135 08/05/2019 0500   K 4.2 08/05/2019 0500   CL 103 08/05/2019 0500   CO2 19 (L) 08/05/2019 0500   GLUCOSE 150 (H) 08/05/2019 0500   BUN 19 08/05/2019 0500   CREATININE 0.94 08/05/2019 0500   CALCIUM  8.8 (L) 08/05/2019 0500   PROT 6.4 (L) 07/08/2019 0633   ALBUMIN 3.9 07/08/2019 0633   AST 32 07/08/2019 0633   ALT 24 07/08/2019 0633   ALKPHOS 43 07/08/2019 0633   BILITOT 1.5 (H) 07/08/2019 0633   GFRNONAA >60 08/05/2019 0500   GFRAA >60 08/05/2019 0500     No results found.     Assessment & Plan:   1. Carotid stenosis, right Recommend:  Given the patient's asymptomatic subcritical stenosis no further invasive testing or surgery at this time.  Duplex ultrasound shows less than 40% stenosis bilaterally.  Previous right carotid endarterectomy is open and patent  Continue antiplatelet therapy as prescribed Continue management of CAD, HTN and Hyperlipidemia Healthy heart diet,  encouraged exercise at least 4 times per week Follow up in 12 months with duplex ultrasound and physical exam   2. Hyperlipidemia, mixed Continue statin as ordered and reviewed, no changes at this time   3. Essential hypertension Continue antihypertensive medications as already ordered, these medications have been reviewed and there are no changes at this time.     Current Outpatient Medications on File Prior to Visit  Medication Sig Dispense Refill   aspirin  EC 81 MG tablet Take by mouth.     Cholecalciferol  (VITAMIN D ) 50 MCG (2000 UT) tablet Take 2,000 Units by mouth daily.     clopidogrel  (PLAVIX ) 75 MG tablet Take 1 tablet (75 mg total) by mouth daily. 90 tablet 3   docusate sodium  (COLACE) 50 MG capsule Take by mouth.     finasteride (PROSCAR) 5 MG  tablet Take 5 mg by mouth.     hydrocortisone 2.5 % cream Apply topically.     pantoprazole  (PROTONIX ) 40 MG tablet Take 1 tablet (40 mg total) by mouth daily. 90 tablet 3   pregabalin (LYRICA) 100 MG capsule Take by mouth.     rosuvastatin  (CRESTOR ) 40 MG tablet Take 1 tablet (40 mg total) by mouth at bedtime. 90 tablet 3   Alpha-Lipoic Acid 200 MG TABS      chlorthalidone (HYGROTON) 25 MG tablet as needed. (Patient not taking: Reported on 06/11/2023)     diphenhydrAMINE  (BENADRYL ) 25 MG tablet Take 25 mg by mouth daily as needed for itching. (Patient not taking: Reported on 06/11/2023)     Psyllium (METAMUCIL PO) Take 1 Dose by mouth daily. (Patient not taking: Reported on 06/11/2023)     No current facility-administered medications on file prior to visit.    There are no Patient Instructions on file for this visit. No follow-ups on file.   Brenisha Tsui E Sunni Richardson, NP

## 2023-06-23 ENCOUNTER — Encounter (INDEPENDENT_AMBULATORY_CARE_PROVIDER_SITE_OTHER): Payer: Self-pay

## 2024-06-10 ENCOUNTER — Ambulatory Visit (INDEPENDENT_AMBULATORY_CARE_PROVIDER_SITE_OTHER): Admitting: Nurse Practitioner

## 2024-06-10 ENCOUNTER — Encounter (INDEPENDENT_AMBULATORY_CARE_PROVIDER_SITE_OTHER)
